# Patient Record
Sex: Female | Born: 1960 | Race: White | Hispanic: No | Marital: Married | Smoking: Former smoker
Health system: Southern US, Community
[De-identification: ages and names within clinical notes are randomized; demographics above are authoritative.]

## PROBLEM LIST (undated history)

## (undated) DIAGNOSIS — F17200 Nicotine dependence, unspecified, uncomplicated: Secondary | ICD-10-CM

## (undated) DIAGNOSIS — G5600 Carpal tunnel syndrome, unspecified upper limb: Secondary | ICD-10-CM

## (undated) DIAGNOSIS — A630 Anogenital (venereal) warts: Secondary | ICD-10-CM

## (undated) DIAGNOSIS — I219 Acute myocardial infarction, unspecified: Secondary | ICD-10-CM

## (undated) DIAGNOSIS — E113299 Type 2 diabetes mellitus with mild nonproliferative diabetic retinopathy without macular edema, unspecified eye: Secondary | ICD-10-CM

## (undated) DIAGNOSIS — I252 Old myocardial infarction: Secondary | ICD-10-CM

## (undated) DIAGNOSIS — Q2381 Congenital stenosis of aortic valve: Secondary | ICD-10-CM

## (undated) DIAGNOSIS — E782 Mixed hyperlipidemia: Secondary | ICD-10-CM

## (undated) DIAGNOSIS — E119 Type 2 diabetes mellitus without complications: Secondary | ICD-10-CM

## (undated) DIAGNOSIS — E7800 Pure hypercholesterolemia, unspecified: Secondary | ICD-10-CM

## (undated) DIAGNOSIS — Z860101 Personal history of adenomatous and serrated colon polyps: Secondary | ICD-10-CM

## (undated) DIAGNOSIS — J302 Other seasonal allergic rhinitis: Secondary | ICD-10-CM

## (undated) DIAGNOSIS — I10 Essential (primary) hypertension: Secondary | ICD-10-CM

## (undated) DIAGNOSIS — K219 Gastro-esophageal reflux disease without esophagitis: Secondary | ICD-10-CM

## (undated) DIAGNOSIS — N950 Postmenopausal bleeding: Secondary | ICD-10-CM

## (undated) DIAGNOSIS — K649 Unspecified hemorrhoids: Secondary | ICD-10-CM

## (undated) DIAGNOSIS — D689 Coagulation defect, unspecified: Secondary | ICD-10-CM

## (undated) DIAGNOSIS — E785 Hyperlipidemia, unspecified: Secondary | ICD-10-CM

## (undated) DIAGNOSIS — R9389 Abnormal findings on diagnostic imaging of other specified body structures: Secondary | ICD-10-CM

## (undated) DIAGNOSIS — I5030 Unspecified diastolic (congestive) heart failure: Secondary | ICD-10-CM

## (undated) DIAGNOSIS — I350 Nonrheumatic aortic (valve) stenosis: Secondary | ICD-10-CM

## (undated) DIAGNOSIS — Z7901 Long term (current) use of anticoagulants: Secondary | ICD-10-CM

## (undated) DIAGNOSIS — R011 Cardiac murmur, unspecified: Secondary | ICD-10-CM

## (undated) DIAGNOSIS — Z973 Presence of spectacles and contact lenses: Secondary | ICD-10-CM

## (undated) DIAGNOSIS — K5730 Diverticulosis of large intestine without perforation or abscess without bleeding: Secondary | ICD-10-CM

## (undated) DIAGNOSIS — D509 Iron deficiency anemia, unspecified: Secondary | ICD-10-CM

## (undated) HISTORY — PX: DILATION AND CURETTAGE OF UTERUS: SHX78

## (undated) HISTORY — DX: Essential (primary) hypertension: I10

## (undated) HISTORY — DX: Anogenital (venereal) warts: A63.0

## (undated) HISTORY — PX: WISDOM TOOTH EXTRACTION: SHX21

## (undated) HISTORY — DX: Hyperlipidemia, unspecified: E78.5

## (undated) HISTORY — DX: Nicotine dependence, unspecified, uncomplicated: F17.200

## (undated) HISTORY — DX: Coagulation defect, unspecified: D68.9

## (undated) HISTORY — DX: Pure hypercholesterolemia, unspecified: E78.00

## (undated) HISTORY — DX: Type 2 diabetes mellitus without complications: E11.9

## (undated) HISTORY — DX: Carpal tunnel syndrome, unspecified upper limb: G56.00

## (undated) HISTORY — PX: COLONOSCOPY: SHX174

---
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
## 1995-01-05 DIAGNOSIS — E119 Type 2 diabetes mellitus without complications: Secondary | ICD-10-CM

## 1995-01-05 HISTORY — DX: Type 2 diabetes mellitus without complications: E11.9

## 1998-11-07 ENCOUNTER — Other Ambulatory Visit: Admission: RE | Admit: 1998-11-07 | Discharge: 1998-11-07 | Payer: Self-pay | Admitting: Gynecology

## 1999-12-02 ENCOUNTER — Other Ambulatory Visit: Admission: RE | Admit: 1999-12-02 | Discharge: 1999-12-02 | Payer: Self-pay | Admitting: Gynecology

## 2001-02-23 ENCOUNTER — Other Ambulatory Visit: Admission: RE | Admit: 2001-02-23 | Discharge: 2001-02-23 | Payer: Self-pay | Admitting: Obstetrics and Gynecology

## 2002-03-09 ENCOUNTER — Other Ambulatory Visit: Admission: RE | Admit: 2002-03-09 | Discharge: 2002-03-09 | Payer: Self-pay | Admitting: Obstetrics and Gynecology

## 2003-03-27 ENCOUNTER — Other Ambulatory Visit: Admission: RE | Admit: 2003-03-27 | Discharge: 2003-03-27 | Payer: Self-pay | Admitting: Obstetrics and Gynecology

## 2003-10-08 ENCOUNTER — Encounter: Admission: RE | Admit: 2003-10-08 | Discharge: 2004-01-06 | Payer: Self-pay | Admitting: Endocrinology

## 2004-02-04 ENCOUNTER — Ambulatory Visit: Payer: Self-pay | Admitting: Endocrinology

## 2004-03-11 ENCOUNTER — Ambulatory Visit: Payer: Self-pay | Admitting: Endocrinology

## 2004-03-17 ENCOUNTER — Ambulatory Visit: Payer: Self-pay | Admitting: Endocrinology

## 2004-04-07 ENCOUNTER — Ambulatory Visit: Payer: Self-pay | Admitting: Gastroenterology

## 2004-04-30 ENCOUNTER — Other Ambulatory Visit: Admission: RE | Admit: 2004-04-30 | Discharge: 2004-04-30 | Payer: Self-pay | Admitting: Obstetrics and Gynecology

## 2004-05-20 ENCOUNTER — Ambulatory Visit: Payer: Self-pay | Admitting: Gastroenterology

## 2004-07-24 ENCOUNTER — Ambulatory Visit: Payer: Self-pay | Admitting: Endocrinology

## 2004-07-29 ENCOUNTER — Ambulatory Visit: Payer: Self-pay | Admitting: Gastroenterology

## 2004-07-31 ENCOUNTER — Ambulatory Visit: Payer: Self-pay | Admitting: Endocrinology

## 2005-02-20 ENCOUNTER — Ambulatory Visit (HOSPITAL_COMMUNITY): Admission: RE | Admit: 2005-02-20 | Discharge: 2005-02-20 | Payer: Self-pay | Admitting: Emergency Medicine

## 2005-04-06 ENCOUNTER — Ambulatory Visit: Payer: Self-pay | Admitting: Endocrinology

## 2005-11-05 ENCOUNTER — Ambulatory Visit: Payer: Self-pay | Admitting: Endocrinology

## 2005-11-05 LAB — CONVERTED CEMR LAB
ALT: 18 units/L (ref 0–40)
Albumin: 3.8 g/dL (ref 3.5–5.2)
Alkaline Phosphatase: 54 units/L (ref 39–117)
BUN: 14 mg/dL (ref 6–23)
Basophils Absolute: 0.1 10*3/uL (ref 0.0–0.1)
Basophils Relative: 0.7 % (ref 0.0–1.0)
Bilirubin Urine: NEGATIVE
Calcium: 9.5 mg/dL (ref 8.4–10.5)
Chloride: 105 meq/L (ref 96–112)
Cholesterol: 146 mg/dL (ref 0–200)
Eosinophil percent: 1.5 % (ref 0.0–5.0)
GFR calc non Af Amer: 97 mL/min
Hemoglobin: 14.5 g/dL (ref 12.0–15.0)
Ketones, ur: NEGATIVE mg/dL
LDL Cholesterol: 70 mg/dL (ref 0–99)
MCHC: 34.5 g/dL (ref 30.0–36.0)
Microalb Creat Ratio: 6.9 mg/g (ref 0.0–30.0)
Monocytes Relative: 6.6 % (ref 3.0–11.0)
Neutrophils Relative %: 62.5 % (ref 43.0–77.0)
Nitrite: NEGATIVE
Potassium: 4.3 meq/L (ref 3.5–5.1)
RDW: 11.8 % (ref 11.5–14.6)
Sodium: 136 meq/L (ref 135–145)
Specific Gravity, Urine: 1.02 (ref 1.000–1.03)
Total Bilirubin: 0.7 mg/dL (ref 0.3–1.2)
Triglyceride fasting, serum: 141 mg/dL (ref 0–149)

## 2005-11-11 ENCOUNTER — Ambulatory Visit: Payer: Self-pay | Admitting: Endocrinology

## 2005-12-03 ENCOUNTER — Ambulatory Visit: Payer: Self-pay | Admitting: Endocrinology

## 2006-07-25 ENCOUNTER — Emergency Department (HOSPITAL_COMMUNITY): Admission: EM | Admit: 2006-07-25 | Discharge: 2006-07-26 | Payer: Self-pay | Admitting: Emergency Medicine

## 2006-08-04 ENCOUNTER — Ambulatory Visit: Payer: Self-pay | Admitting: Internal Medicine

## 2006-08-10 ENCOUNTER — Ambulatory Visit: Payer: Self-pay | Admitting: Endocrinology

## 2006-08-12 ENCOUNTER — Ambulatory Visit: Payer: Self-pay | Admitting: Endocrinology

## 2006-08-20 ENCOUNTER — Encounter: Payer: Self-pay | Admitting: Endocrinology

## 2006-08-20 DIAGNOSIS — I10 Essential (primary) hypertension: Secondary | ICD-10-CM

## 2007-03-30 ENCOUNTER — Encounter: Payer: Self-pay | Admitting: Endocrinology

## 2007-04-04 ENCOUNTER — Ambulatory Visit: Payer: Self-pay | Admitting: Endocrinology

## 2007-04-04 DIAGNOSIS — J309 Allergic rhinitis, unspecified: Secondary | ICD-10-CM | POA: Insufficient documentation

## 2007-04-04 LAB — CONVERTED CEMR LAB: Hgb A1c MFr Bld: 8.4 % — ABNORMAL HIGH (ref 4.6–6.0)

## 2007-05-11 ENCOUNTER — Ambulatory Visit: Payer: Self-pay | Admitting: Endocrinology

## 2007-05-18 ENCOUNTER — Telehealth: Payer: Self-pay | Admitting: Endocrinology

## 2007-05-18 ENCOUNTER — Encounter: Payer: Self-pay | Admitting: Endocrinology

## 2007-06-22 ENCOUNTER — Ambulatory Visit: Payer: Self-pay | Admitting: Endocrinology

## 2007-08-28 ENCOUNTER — Telehealth: Payer: Self-pay | Admitting: Endocrinology

## 2007-11-06 LAB — CONVERTED CEMR LAB: Pap Smear: NORMAL

## 2008-03-08 ENCOUNTER — Encounter: Payer: Self-pay | Admitting: Endocrinology

## 2008-05-13 ENCOUNTER — Telehealth: Payer: Self-pay | Admitting: Endocrinology

## 2008-06-25 ENCOUNTER — Ambulatory Visit: Payer: Self-pay | Admitting: Endocrinology

## 2008-06-25 DIAGNOSIS — E7800 Pure hypercholesterolemia, unspecified: Secondary | ICD-10-CM

## 2008-06-25 LAB — CONVERTED CEMR LAB
ALT: 18 units/L (ref 0–35)
Alkaline Phosphatase: 57 units/L (ref 39–117)
BUN: 12 mg/dL (ref 6–23)
Bilirubin, Direct: 0.1 mg/dL (ref 0.0–0.3)
CO2: 26 meq/L (ref 19–32)
Creatinine, Ser: 0.5 mg/dL (ref 0.4–1.2)
Direct LDL: 247.7 mg/dL
GFR calc non Af Amer: 140.31 mL/min (ref >60)
Glucose, Bld: 179 mg/dL — ABNORMAL HIGH (ref 70–99)
Sodium: 141 meq/L (ref 135–145)
VLDL: 34.8 mg/dL (ref 0.0–40.0)

## 2008-07-09 ENCOUNTER — Ambulatory Visit: Payer: Self-pay | Admitting: Endocrinology

## 2008-07-11 ENCOUNTER — Telehealth (INDEPENDENT_AMBULATORY_CARE_PROVIDER_SITE_OTHER): Payer: Self-pay | Admitting: *Deleted

## 2009-06-24 ENCOUNTER — Encounter: Payer: Self-pay | Admitting: Endocrinology

## 2009-11-18 ENCOUNTER — Ambulatory Visit: Payer: Self-pay | Admitting: Endocrinology

## 2009-11-18 LAB — CONVERTED CEMR LAB
ALT: 18 units/L (ref 0–35)
AST: 16 units/L (ref 0–37)
BUN: 15 mg/dL (ref 6–23)
Basophils Absolute: 0 10*3/uL (ref 0.0–0.1)
Bilirubin, Direct: 0.1 mg/dL (ref 0.0–0.3)
CO2: 26 meq/L (ref 19–32)
Cholesterol: 375 mg/dL — ABNORMAL HIGH (ref 0–200)
Creatinine, Ser: 0.9 mg/dL (ref 0.4–1.2)
Eosinophils Absolute: 0.1 10*3/uL (ref 0.0–0.7)
HCT: 39.3 % (ref 36.0–46.0)
Hemoglobin, Urine: NEGATIVE
Hemoglobin: 14.1 g/dL (ref 12.0–15.0)
Lymphocytes Relative: 22.9 % (ref 12.0–46.0)
Lymphs Abs: 2.1 10*3/uL (ref 0.7–4.0)
MCHC: 35.8 g/dL (ref 30.0–36.0)
MCV: 90.3 fL (ref 78.0–100.0)
Microalb Creat Ratio: 2.1 mg/g (ref 0.0–30.0)
Monocytes Relative: 7.7 % (ref 3.0–12.0)
Neutro Abs: 6 10*3/uL (ref 1.4–7.7)
Neutrophils Relative %: 67.4 % (ref 43.0–77.0)
RBC: 4.35 M/uL (ref 3.87–5.11)
Specific Gravity, Urine: 1.01 (ref 1.000–1.030)
Total Bilirubin: 0.9 mg/dL (ref 0.3–1.2)
Urobilinogen, UA: 0.2 (ref 0.0–1.0)
WBC: 9 10*3/uL (ref 4.5–10.5)
pH: 5.5 (ref 5.0–8.0)

## 2009-12-18 ENCOUNTER — Encounter: Payer: Self-pay | Admitting: Endocrinology

## 2009-12-18 ENCOUNTER — Ambulatory Visit: Payer: Self-pay | Admitting: Endocrinology

## 2009-12-18 DIAGNOSIS — R079 Chest pain, unspecified: Secondary | ICD-10-CM

## 2009-12-18 DIAGNOSIS — Z87891 Personal history of nicotine dependence: Secondary | ICD-10-CM

## 2010-01-08 ENCOUNTER — Ambulatory Visit
Admission: RE | Admit: 2010-01-08 | Discharge: 2010-01-08 | Payer: Self-pay | Source: Home / Self Care | Attending: Cardiology | Admitting: Cardiology

## 2010-01-15 ENCOUNTER — Ambulatory Visit
Admission: RE | Admit: 2010-01-15 | Discharge: 2010-01-15 | Payer: Self-pay | Source: Home / Self Care | Attending: Endocrinology | Admitting: Endocrinology

## 2010-01-25 ENCOUNTER — Encounter: Payer: Self-pay | Admitting: Emergency Medicine

## 2010-01-29 ENCOUNTER — Ambulatory Visit: Admit: 2010-01-29 | Payer: Self-pay | Admitting: Cardiology

## 2010-01-29 ENCOUNTER — Ambulatory Visit: Admit: 2010-01-29 | Payer: Self-pay

## 2010-02-03 NOTE — Letter (Signed)
Summary: Dr Davina Poke [facility]  Dr Davina Poke [facility]   Imported By: Lester [place] 07/10/2009 10:21:05  _____________________________________________________________________  External Attachment:    Type:   Image     Comment:   External Document

## 2010-02-03 NOTE — Assessment & Plan Note (Signed)
Summary: f/u appt/#/cd   Vital Signs:  Patient profile:   50 year old female Height:      66 inches (167.64 cm) Weight:      200.50 pounds (91.14 kg) BMI:     32.48 O2 Sat:      95 % on Room air Temp:     98.6 degrees F (37.00 degrees C) oral Pulse rate:   102 / minute BP sitting:   122 / 70  (left arm) Cuff size:   regular  Vitals Entered By: Brenton Grills CMA Duncan Dull) (November 18, 2009 1:19 PM)  O2 Flow:  Room air CC: Follow-up visit/discuss meds/refill meds/pt is not currently taking Zocor/aj Is Patient Diabetic? Yes   CC:  Follow-up visit/discuss meds/refill meds/pt is not currently taking Zocor/aj.  History of Present Illness: no cbg record, but states cbg's are well-controlled, on 80 units three times a day (just before each meal).  however, she has been out of insulin x a few weeks.   pt states a few weeks moderate itching at the vaginal area, and assoc d/c. she wants to go back to lipitor.  Current Medications (verified): 1)  Lisinopril-Hydrochlorothiazide 20-12.5 Mg  Tabs (Lisinopril-Hydrochlorothiazide) .... Take 2 By Mouth Qd 2)  Glucose Test Strips, Any Brand .... Qid 3)  Humalog Kwikpen 100 Unit/ml  Soln (Insulin Lispro (Human)) .... 70 Units With Each Meal. 4)  Bd Pen Needle Ultrafine 29g X 12.79mm Misc (Insulin Pen Needle) .... Tid 5)  Zocor 80 Mg Tabs (Simvastatin) .Marland Kitchen.. 1 Qhs  Allergies (verified): No Known Drug Allergies  Past History:  Past Medical History: Last updated: 08/20/2006 Diabetes mellitus, type I Hypertension Dyslipidemia Gential Warts Smoker Congential Adrenal Hyperpisia CTS  Review of Systems  The patient denies weight loss and weight gain.    Physical Exam  General:  obese.  no distress  Pulses:  dorsalis pedis intact bilat.  Extremities:  no deformity.  no ulcer on the feet.  feet are of normal color and temp.  no edema  Neurologic:  sensation is intact to touch on the feet    Impression & Recommendations:  Problem #  1:  DIABETES MELLITUS, TYPE I (ICD-250.01) therapy limited by noncompliance.  i'll do the best i can.  she may do better with a once daily insulin  Problem # 2:  vaginitis prob yeast due to #1  Problem # 3:  HYPERCHOLESTEROLEMIA (ICD-272.0) needs increased rx  Medications Added to Medication List This Visit: 1)  Onetouch Ultra Blue Strp (Glucose blood) .... Use as directed three times a day 2)  Humalog Kwikpen 100 Unit/ml Soln (Insulin lispro (human)) .... 80 units with each meal. 3)  Lipitor 80 Mg Tabs (Atorvastatin calcium) .Marland Kitchen.. 1 tab at bedtime 4)  Fluconazole 100 Mg Tabs (Fluconazole) .Marland Kitchen.. 1 tab once daily  Other Orders: TLB-Lipid Panel (80061-LIPID) TLB-BMP (Basic Metabolic Panel-BMET) (80048-METABOL) TLB-CBC Platelet - w/Differential (85025-CBCD) TLB-Hepatic/Liver Function Pnl (80076-HEPATIC) TLB-TSH (Thyroid Stimulating Hormone) (84443-TSH) TLB-A1C / Hgb A1C (Glycohemoglobin) (83036-A1C) TLB-Microalbumin/Creat Ratio, Urine (82043-MALB) TLB-Udip w/ Micro (81001-URINE) Est. Patient Level IV ([address]109)  Patient Instructions: 1)  take fluconazole 100 mg once daily, x 3 days. 2)  take lipitor 80 mg at bedtime.  do not take this while on the fluconazole.   3)  Please schedule a physical appointment in 3 weeks. 4)  check your blood sugar 2 times a day.  vary the time of day when you check, between before the 3 meals, and at bedtime.  also check if you  have symptoms of your blood sugar being too high or too low.  please keep a record of the readings and bring it to your next appointment here.  please call us sooner if you are having low blood sugar episodes. 5)  good diet and exercise habits significanly improve the control of your diabetes.  please let me know if you wish to be referred to a dietician.  high blood sugar is very risky to your health.  you should see an eye doctor every year. 6)  controlling your blood pressure and cholesterol drastically reduces the damage diabetes does  to your body.  this also applies to quitting smoking.  please discuss these with your doctor.  you should take an aspirin every day, unless you have been advised by a doctor not to. 7)  (update: i left message on phone-tree:  i offered to change to a once daily insulin). Prescriptions: ONETOUCH ULTRA BLUE  STRP (GLUCOSE BLOOD) use as directed three times a day  #100 x 11   Entered by:   Margaret Pyle, CMA   Authorized by:   Minus Breeding MD   Signed by:   Margaret Pyle, CMA on 11/19/2009   Method used:   Electronically to        [facility] [phone]* (retail)       [address]       [place], [place]  [address]       Ph: [phone]       Fax: [phone]   RxID:   [id number] HUMALOG KWIKPEN 100 UNIT/ML  SOLN (INSULIN LISPRO (HUMAN)) 80 units with each meal.  #5 boxes x 11   Entered and Authorized by:   Minus Breeding MD   Signed by:   Minus Breeding MD on 11/18/2009   Method used:   Electronically to        [facility]. [phone]* (retail)       [address]       [place], [place]  [address]       Ph: [phone]       Fax: [phone]   RxID:   [id number] FLUCONAZOLE 100 MG TABS (FLUCONAZOLE) 1 tab once daily  #3 x 0   Entered and Authorized by:   Minus Breeding MD   Signed by:   Minus Breeding MD on 11/18/2009   Method used:   Electronically to        [facility]. [phone]* (retail)       [address]       [place], [place]  [address]       Ph: [phone]       Fax: [phone]   RxID:   [id number] LIPITOR 80 MG TABS (ATORVASTATIN CALCIUM) 1 tab at bedtime  #30 x 11   Entered and Authorized by:   Minus Breeding MD   Signed by:   Minus Breeding MD on 11/18/2009   Method used:   Electronically to        [facility] [phone]* (retail)       [address]       [place], [place]  [address]       Ph:  [phone]  Fax: [phone]   RxID:   [id number]    Orders Added: 1)  TLB-Lipid Panel [80061-LIPID] 2)  TLB-BMP (Basic Metabolic Panel-BMET) [80048-METABOL] 3)  TLB-CBC Platelet - w/Differential [85025-CBCD] 4)  TLB-Hepatic/Liver Function Pnl [80076-HEPATIC] 5)  TLB-TSH (Thyroid Stimulating Hormone) [84443-TSH] 6)  TLB-A1C / Hgb A1C (Glycohemoglobin) [83036-A1C] 7)  TLB-Microalbumin/Creat Ratio, Urine [82043-MALB] 8)  TLB-Udip w/ Micro [81001-URINE] 9)  Est. Patient Level IV [46962]

## 2010-02-05 NOTE — Assessment & Plan Note (Signed)
Summary: CPX/ NWS  #   Vital Signs:  Patient profile:   50 year old female Height:      66 inches (167.64 cm) Weight:      211 pounds (95.91 kg) BMI:     34.18 O2 Sat:      97 % on Room air Temp:     98.6 degrees F (37.00 degrees C) oral Pulse rate:   99 / minute BP sitting:   122 / 70  (left arm) Cuff size:   regular  Vitals Entered By: Brenton Grills CMA Duncan Dull) (December 18, 2009 2:26 PM)  O2 Flow:  Room air CC: CPX/aj Comments Pt is due for yearly pap and mammogram  Vision Screening:      Vision Comments: Eye Exam done 06/24/2009 Dr. London Sheer  Vision Entered By: Brenton Grills CMA Duncan Dull) (December 18, 2009 2:28 PM)   CC:  CPX/aj.  History of Present Illness: here for regular wellness examination.  she's feeling pretty well in general, and does not smoke.  alcohol is rare.   Current Medications (verified): 1)  Lisinopril-Hydrochlorothiazide 20-12.5 Mg  Tabs (Lisinopril-Hydrochlorothiazide) .... Take 2 By Mouth Qd 2)  Onetouch Ultra Blue  Strp (Glucose Blood) .... Use As Directed Three Times A Day 3)  Humalog Kwikpen 100 Unit/ml  Soln (Insulin Lispro (Human)) .... 80 Units With Each Meal. 4)  Bd Pen Needle Ultrafine 29g X 12.34mm Misc (Insulin Pen Needle) .... Tid 5)  Lipitor 80 Mg Tabs (Atorvastatin Calcium) .Marland Kitchen.. 1 Tab At Bedtime 6)  Fluconazole 100 Mg Tabs (Fluconazole) .Marland Kitchen.. 1 Tab Once Daily  Allergies (verified): No Known Drug Allergies  Family History: Reviewed history from 07/09/2008 and no changes required. mother: breast cancer  Social History: Reviewed history from 07/09/2008 and no changes required. works 3rd shift in [employer] married  Review of Systems       The patient complains of weight gain.  The patient denies fever, vision loss, decreased hearing, chest pain, syncope, prolonged cough, headaches, abdominal pain, melena, hematochezia, and severe indigestion/heartburn.    Physical Exam  General:  normal appearance.   Head:   head: no deformity eyes: no periorbital swelling, no proptosis external nose and ears are normal mouth: no lesion seen Neck:  Supple without thyroid enlargement or tenderness.  Breasts:  sees gyn Lungs:  Clear to auscultation bilaterally. Normal respiratory effort.  Heart:  Regular rate and rhythm without murmurs or gallops noted. Normal S1,S2.   Abdomen:  abdomen is soft, nontender.  no hepatosplenomegaly.   not distended.  no hernia  Rectal:  sees gyn  Genitalia:  sees gyn  Msk:  muscle bulk and strength are grossly normal.  no obvious joint swelling.  gait is normal and steady  Pulses:  dorsalis pedis intact bilat.  no carotid bruit  Extremities:  no deformity.  no ulcer on the feet.  feet are of normal color and temp.  no edema  Neurologic:  cn 2-12 grossly intact.   readily moves all 4's.   sensation is intact to touch on the feet  Skin:  normal texture and temp.  no rash.  not diaphoretic there is mild hirsutism on the face Cervical Nodes:  No significant adenopathy.  Psych:  Alert and cooperative; normal mood and affect; normal attention span and concentration.   Additional Exam:  SEPARATE EVALUATION FOLLOWS--EACH PROBLEM HERE IS NEW, NOT RESPONDING TO TREATMENT, OR POSES SIGNIFICANT RISK TO THE PATIENT'S HEALTH: HISTORY OF THE PRESENT ILLNESS: pt states few weeks  of intermittent non-exertional slight pain in the chest.  she describes as a "twinge."  no assoc sob. she also c/o occ "tickle in the throat." pt says the 3rd shift work is making it hard for her to manage her dm.  cbg's are variable, but are higher after eating.   PAST MEDICAL HISTORY reviewed and up to date today REVIEW OF SYSTEMS: denies sob and hypoglycemia PHYSICAL EXAMINATION: chest wall: nontender. clear to auscultation.  no respiratory distress reg rate and rhythm.  no murmur LAB/XRAY RESULTS: see ecg report. Hemoglobin A1C       [H]  12.9 %  IMPRESSION: chest pain, uncertain etiology cough due  to lisinopril.  pt declines changing to arb dm.  rx limited by 3rd shift work.  she needs a simpler schedule.   PLAN: see instruction sheet.   Impression & Recommendations:  Problem # 1:  ROUTINE GENERAL MEDICAL EXAM@HEALTH  CARE FACL (ICD-V70.0)  Medications Added to Medication List This Visit: 1)  Humalog Kwikpen 100 Unit/ml Soln (Insulin lispro (human)) .Marland Kitchen.. 10 units with each meal. 2)  Lantus Solostar 100 Unit/ml Soln (Insulin glargine) .... 250 units each am 3)  Aspirin 81 Mg Tbec (Aspirin) .Marland Kitchen.. 1 tab once daily  Other Orders: EKG w/ Interpretation (93000) Pneumococcal Vaccine (16109) Admin 1st Vaccine (60454) Flu Vaccine 4yrs + (09811) Admin of Any Addtl Vaccine (91478) Cardiology Referral (Cardiology) Est. Patient Level IV (29562) Est. Patient 40-64 years [phone])  Preventive Care Screening     gyn is m bigleman   Patient Instructions: 1)  check your blood sugar 2 times a day.  vary the time of day when you check, between before the 3 meals, and at bedtime.  also check if you have symptoms of your blood sugar being too high or too low.  please keep a record of the readings and bring it to your next appointment here.  please call us sooner if you are having low blood sugar episodes. 2)  start lantus 250 units each evening. 3)  also take humalog 10 units with each meal. 4)  Please schedule a follow-up appointment in 1 month. 5)  we'll recheck cholesterol with next blood tests.  6)  check "treadmill" test.  you will be called with a day and time for an appointment. 7)  please consider these measures for your health:  minimize alcohol.  do not use tobacco products.  have a colonoscopy at least every 10 years from age 73.  keep firearms safely stored.  always use seat belts.  have working smoke alarms in your home.  see an eye doctor and dentist regularly.  never drive under the influence of alcohol or drugs (including prescription drugs).  those with fair skin should take  precautions against the sun. 8)  please let me know what your wishes would be, if artificial life support measures should become necessary.  it is critically important to prevent falling down (keep floor areas well-lit, dry, and free of loose objects). 9)  (update: we discussed code status.  pt requests full code, but would not want to be started or maintained on artificial life-support measures if there was not a reasonable chance of recovery). Prescriptions: LANTUS SOLOSTAR 100 UNIT/ML SOLN (INSULIN GLARGINE) 250 units each am  #6 boxes x 11   Entered and Authorized by:   Minus Breeding MD   Signed by:   Minus Breeding MD on 12/18/2009   Method used:   Electronically to  [facility] [address] [phone]* (retail)       [address]       [place], [place]  [address]       Ph: [phone]       Fax: [phone]   RxID:   [id number]    Orders Added: 1)  EKG w/ Interpretation [93000] 2)  Pneumococcal Vaccine [90732] 3)  Admin 1st Vaccine [90471] 4)  Flu Vaccine 58yrs + [[address]] 5)  Admin of Any Addtl Vaccine [90472] 6)  Cardiology Referral [Cardiology] 7)  Est. Patient Level IV [66440] 8)  Est. Patient 40-64 years [99396]   Immunizations Administered:  Pneumonia Vaccine:    Vaccine Type: Pneumovax    Site: left deltoid    Mfr: Merck    Dose: 0.5 ml    Route: IM    Given by: Brenton Grills CMA (AAMA)    Exp. Date: 05/01/2011    Lot #: [id number]    VIS given: 12/09/08 version given December 18, 2009.  Influenza Vaccine # 1:    Vaccine Type: Fluvax 3+    Site: right deltoid    Mfr: Sanofi Pasteur    Dose: 0.5 ml    Given by: Brenton Grills CMA (AAMA)    Exp. Date: 07/04/2010    Lot #: [id number]    VIS given: 2011   Immunizations Administered:  Pneumonia Vaccine:    Vaccine Type: Pneumovax    Site: left deltoid    Mfr: Merck    Dose: 0.5 ml    Route: IM    Given by: Brenton Grills CMA (AAMA)    Exp. Date: 05/01/2011    Lot #:  [id number]    VIS given: 12/09/08 version given December 18, 2009.  Influenza Vaccine # 1:    Vaccine Type: Fluvax 3+    Site: right deltoid    Mfr: Sanofi Pasteur    Dose: 0.5 ml    Given by: Brenton Grills CMA (AAMA)    Exp. Date: 07/04/2010    Lot #: [id number]    VIS given: 2011

## 2010-02-05 NOTE — Assessment & Plan Note (Signed)
Summary: 1 MOS F/U #/CD   Vital Signs:  Patient profile:   50 year old female Height:      66 inches (167.64 cm) Weight:      220.50 pounds (100.23 kg) BMI:     35.72 O2 Sat:      96 % on Room air Temp:     98.2 degrees F (36.78 degrees C) oral Pulse rate:   89 / minute BP sitting:   130 / 68  (left arm) Cuff size:   large  Vitals Entered By: Brenton Grills CMA (AAMA) (January 15, 2010 1:30 PM)  O2 Flow:  Room air CC: 1 month F/U/aj Is Patient Diabetic? Yes Comments Pt is due for mammogram and pap   Primary Provider:  Minus Breeding MD  CC:  1 month F/U/aj.  History of Present Illness: no cbg record, but states cbg's are sometimes low if she hasn't eaten in many hours, but is higher after eating.  pt states she feels well in general.    Current Medications (verified): 1)  Lisinopril-Hydrochlorothiazide 20-12.5 Mg  Tabs (Lisinopril-Hydrochlorothiazide) .... Take 2 By Mouth Qd 2)  Onetouch Ultra Blue  Strp (Glucose Blood) .... Use As Directed Three Times A Day 3)  Humalog Kwikpen 100 Unit/ml  Soln (Insulin Lispro (Human)) .Marland Kitchen.. 10 Units With Each Meal. 4)  Bd Pen Needle Ultrafine 29g X 12.39mm Misc (Insulin Pen Needle) .... Tid 5)  Lipitor 80 Mg Tabs (Atorvastatin Calcium) .Marland Kitchen.. 1 Tab At Bedtime 6)  Lantus Solostar 100 Unit/ml Soln (Insulin Glargine) .... 250 Units Each Am 7)  Aspirin 81 Mg Tbec (Aspirin) .Marland Kitchen.. 1 Tab Once Daily  Allergies (verified): No Known Drug Allergies  Past History:  Past Medical History: Last updated: 08/20/2006 Diabetes mellitus, type I Hypertension Dyslipidemia Gential Warts Smoker Congential Adrenal Hyperpisia CTS  Review of Systems  The patient denies syncope.    Physical Exam  General:  normal appearance.   Skin:  injection sites at the anterior abdomen are without lesions, except for 1 ecchymosis..     Impression & Recommendations:  Problem # 1:  DIABETES MELLITUS, TYPE I (ICD-250.01) the pattern of her cbg's indicates she  needs more humalog, and less lantus  Medications Added to Medication List This Visit: 1)  Bd Pen Needle Ultrafine 29g X 12.82mm Misc (Insulin pen needle) .... 5/day  Other Orders: Est. Patient Level III (16109)  Patient Instructions: 1)  check your blood sugar 2 times a day.  vary the time of day when you check, between before the 3 meals, and at bedtime.  also check if you have symptoms of your blood sugar being too high or too low.  please keep a record of the readings and bring it to your next appointment here.  please call us sooner if you are having low blood sugar episodes. 2)  start lantus 220 units each evening. 3)  also take humalog 20 units with each meal. 4)  Please schedule a follow-up appointment in 2 months. Prescriptions: HUMALOG KWIKPEN 100 UNIT/ML  SOLN (INSULIN LISPRO (HUMAN)) 10 units with each meal.  #1 box x 11   Entered and Authorized by:   Minus Breeding MD   Signed by:   Minus Breeding MD on 01/15/2010   Method used:   Print then Give to Patient   RxID:   [id number] BD PEN NEEDLE ULTRAFINE 29G X 12.7MM MISC (INSULIN PEN NEEDLE) 5/day  #450 x 3   Entered and Authorized by:   Gregary Signs  Ardeen Garland MD   Signed by:   Minus Breeding MD on 01/15/2010   Method used:   Electronically to        [facility]* (retail)       [address]       [place], [place]  [address]       Ph: [phone] or [phone]       Fax: [phone]   RxID:   [id number]    Orders Added: 1)  Est. Patient Level III [57322]

## 2010-02-05 NOTE — Assessment & Plan Note (Signed)
Summary: NP6/CHEST PAIN      Allergies Added: NKDA  Visit Type:  Initial Consult Primary Provider:  Minus Breeding MD  CC:  chest pain.  History of Present Illness: The patient presents for evaluation of chest discomfort. She has no prior cardiac history. However, she has multiple cardiovascular risk factors. She has been in the past couple of months noticing occasional chest discomfort. This is at rest.  It is a left upper chest discomfort. He has been a stable pattern. He describes it as somewhat dull but maybe 4/10. She doesn't pay much attention to it. She will notice that his airand it goes away spontaneously. She cannot bring this on with activities such as vacuuming or climbing stairs. She has no associated symptoms of nausea vomiting or diaphoresis. She has no radiation to her jaw or to her arms. She has no weight gain or edema. She has no shortness of breath, PND or orthopnea..  Current Medications (verified): 1)  Lisinopril-Hydrochlorothiazide 20-12.5 Mg  Tabs (Lisinopril-Hydrochlorothiazide) .... Take 2 By Mouth Qd 2)  Onetouch Ultra Blue  Strp (Glucose Blood) .... Use As Directed Three Times A Day 3)  Humalog Kwikpen 100 Unit/ml  Soln (Insulin Lispro (Human)) .Marland Kitchen.. 10 Units With Each Meal. 4)  Bd Pen Needle Ultrafine 29g X 12.62mm Misc (Insulin Pen Needle) .... Tid 5)  Lipitor 80 Mg Tabs (Atorvastatin Calcium) .Marland Kitchen.. 1 Tab At Bedtime 6)  Lantus Solostar 100 Unit/ml Soln (Insulin Glargine) .... 250 Units Each Am 7)  Aspirin 81 Mg Tbec (Aspirin) .Marland Kitchen.. 1 Tab Once Daily  Allergies (verified): No Known Drug Allergies  Past History:  Past Medical History: Reviewed history from 08/20/2006 and no changes required. Diabetes mellitus, type I Hypertension Dyslipidemia Gential Warts Smoker Congential Adrenal Hyperpisia CTS  Past Surgical History: Reviewed history from 08/20/2006 and no changes required. D & C (1977) EKG (11/11/2005)  Family History: Mother: breast cancer, later  onset heart disease Father with coronary disease starting in his 66s. A bypass and mitral infarction  Social History: Works 3rd shift in [employer], married.  She smoked one pack per day for 26 years but quit in 2009.  Review of Systems       As stated in the HPI and negative for all other systems.   Vital Signs:  Patient profile:   50 year old female Height:      66 inches Weight:      214 pounds BMI:     34.67 Pulse rate:   55 / minute Resp:     16 per minute BP sitting:   178 / 96  (left arm)  Vitals Entered By: Marrion Coy, CNA (January 08, 2010 3:34 PM)  Physical Exam  General:  Well developed, well nourished, in no acute distress. Head:  normocephalic and atraumatic Eyes:  PERRLA/EOM intact; conjunctiva and lids normal. Mouth:  Teeth, gums and palate normal. Oral mucosa normal. Neck:  Neck supple, no JVD. No masses, thyromegaly or abnormal cervical nodes. Chest Wall:  no deformities or breast masses noted Lungs:  Clear bilaterally to auscultation and percussion. Abdomen:  Bowel sounds positive; abdomen soft and non-tender without masses, organomegaly, or hernias noted. No hepatosplenomegaly. Msk:  Back normal, normal gait. Muscle strength and tone normal. Skin:  Intact without lesions or rashes. Cervical Nodes:  no significant adenopathy Axillary Nodes:  no significant adenopathy Inguinal Nodes:  no significant adenopathy Psych:  Normal affect.   Detailed Cardiovascular Exam  Neck    Carotids: Carotids  full and equal bilaterally without bruits.      Neck Veins: Normal, no JVD.    Heart    Inspection: no deformities or lifts noted.      Palpation: normal PMI with no thrills palpable.      Auscultation: regular rate and rhythm, S1, S2 without murmurs, rubs, gallops, or clicks.    Vascular    Abdominal Aorta: no palpable masses, pulsations, or audible bruits.      Femoral Pulses: normal femoral pulses bilaterally.      Pedal Pulses: normal pedal  pulses bilaterally.      Radial Pulses: normal radial pulses bilaterally.      Peripheral Circulation: no clubbing, cyanosis, or edema noted with normal capillary refill.     Impression & Recommendations:  Problem # 1:  CHEST PAIN (ICD-786.50) The patient's chest pain is atypical. She has no objective evidence of ischemia. However, she has very significant risk factors. Screening exercise treadmill test is indicated. In addition to looking for obstructive coronary disease and risk stratify I think the most important thing that will come of this is a prescription for exercise. Orders: Treadmill (Treadmill)  Problem # 2:  HYPERCHOLESTEROLEMIA (ICD-272.0) I reviewed her lipids. Her total cholesterol was 375 in December. However, this was prior to being switched to Lipitor. This is being followed by Dr. Everardo All. Orders: Treadmill (Treadmill)  Problem # 3:  HYPERTENSION (ICD-401.9) Her blood pressure is controlled. She will continue the meds as listed.  Problem # 4:  DIABETES MELLITUS, TYPE I (ICD-250.01) Her hemoglobin A1c most recently was 12.9. She understands that this is not acceptable and she is now paying more attention to this.  Patient Instructions: 1)  Your physician recommends that you schedule a follow-up appointment at the time of your treadmill 2)  Your physician recommends that you continue on your current medications as directed. Please refer to the Current Medication list given to you today. 3)  Your physician has requested that you have an exercise tolerance test.  For further information please visit [email or web address]  Please also follow instruction sheet, as given.

## 2010-03-11 ENCOUNTER — Telehealth: Payer: Self-pay | Admitting: Endocrinology

## 2010-03-13 ENCOUNTER — Encounter: Payer: Self-pay | Admitting: Cardiology

## 2010-03-17 ENCOUNTER — Encounter: Payer: Self-pay | Admitting: Endocrinology

## 2010-03-17 ENCOUNTER — Other Ambulatory Visit: Payer: BC Managed Care – PPO

## 2010-03-17 ENCOUNTER — Other Ambulatory Visit: Payer: Self-pay | Admitting: Endocrinology

## 2010-03-17 ENCOUNTER — Ambulatory Visit (INDEPENDENT_AMBULATORY_CARE_PROVIDER_SITE_OTHER): Payer: BC Managed Care – PPO | Admitting: Endocrinology

## 2010-03-17 DIAGNOSIS — E109 Type 1 diabetes mellitus without complications: Secondary | ICD-10-CM

## 2010-03-17 NOTE — Progress Notes (Signed)
Summary: 90 day supply  Phone Note Refill Request Message from:  Patient on March 11, 2010 10:55 AM  Refills Requested: Medication #1:  LIPITOR 80 MG TABS 1 tab at bedtime   Dosage confirmed as above?Dosage Confirmed   Supply Requested: 9 months Pt called requesting 90 day supply with refills per [employer]   Method Requested: Electronic Initial call taken by: Margaret Pyle, CMA,  March 11, 2010 10:55 AM    Prescriptions: LIPITOR 80 MG TABS (ATORVASTATIN CALCIUM) 1 tab at bedtime  #90 x 2   Entered by:   Margaret Pyle, CMA   Authorized by:   Minus Breeding MD   Signed by:   Margaret Pyle, CMA on 03/11/2010   Method used:   Electronically to        [id number] (retail)       [address]       [place], [place]  [address]       Ph: [phone] or [phone]       Fax: [phone]   RxID:   [id number]

## 2010-03-24 NOTE — Assessment & Plan Note (Signed)
Summary: 2 month follow up/ ss   Vital Signs:  Patient profile:   50 year old female Height:      66 inches (167.64 cm) Weight:      226 pounds (102.73 kg) BMI:     36.61 O2 Sat:      95 % on Room air Temp:     98.8 degrees F (37.11 degrees C) oral Pulse rate:   98 / minute BP sitting:   118 / 72  (left arm) Cuff size:   large  Vitals Entered By: Brenton Grills CMA (AAMA) (March 17, 2010 1:08 PM)  O2 Flow:  Room air CC: 2 month F/U/aj Is Patient Diabetic? Yes   Primary Provider:  Minus Breeding MD  CC:  2 month F/U/aj.  History of Present Illness: pt states she feels well in general.  no cbg record, but states cbg's are sometimes as low as the 70's, in the early hrs of the am, or if a meal is delayed.    Current Medications (verified): 1)  Lisinopril-Hydrochlorothiazide 20-12.5 Mg  Tabs (Lisinopril-Hydrochlorothiazide) .... Take 2 By Mouth Qd 2)  Onetouch Ultra Blue  Strp (Glucose Blood) .... Use As Directed Three Times A Day 3)  Humalog Kwikpen 100 Unit/ml  Soln (Insulin Lispro (Human)) .Marland Kitchen.. 10 Units With Each Meal. 4)  Bd Pen Needle Ultrafine 29g X 12.76mm Misc (Insulin Pen Needle) .... 5/day 5)  Lipitor 80 Mg Tabs (Atorvastatin Calcium) .Marland Kitchen.. 1 Tab At Bedtime 6)  Lantus Solostar 100 Unit/ml Soln (Insulin Glargine) .... 250 Units Each Am 7)  Aspirin 81 Mg Tbec (Aspirin) .Marland Kitchen.. 1 Tab Once Daily  Allergies (verified): No Known Drug Allergies  Past History:  Past Medical History: Last updated: 08/20/2006 Diabetes mellitus, type I Hypertension Dyslipidemia Gential Warts Smoker Congential Adrenal Hyperpisia CTS  Social History: Reviewed history from 01/08/2010 and no changes required. Works 3rd shift in [employer], married.  She smoked one pack per day for 26 years but quit in 2009.  Review of Systems  The patient denies syncope.    Physical Exam  General:  normal appearance.   Pulses:  dorsalis pedis intact bilat.   Extremities:  no deformity.   no ulcer on the feet.  feet are of normal color and temp.  no edema.  Neurologic:  sensation is intact to touch on the feet  Additional Exam:  Hemoglobin A1C       [H]  8.2 %    Impression & Recommendations:  Problem # 1:  DIABETES MELLITUS, TYPE I (ICD-250.01) she needs some adjustment in her therapy  Medications Added to Medication List This Visit: 1)  Humalog Kwikpen 100 Unit/ml Soln (Insulin lispro (human)) .... 30 units three times a day (just before each meal) 2)  Lantus Solostar 100 Unit/ml Soln (Insulin glargine) .Marland KitchenMarland KitchenMarland Kitchen 190 units once daily  Other Orders: TLB-A1C / Hgb A1C (Glycohemoglobin) (83036-A1C) Est. Patient Level III ([address])  Patient Instructions: 1)  check your blood sugar 2 times a day.  vary the time of day when you check, between before the 3 meals, and at bedtime.  also check if you have symptoms of your blood sugar being too high or too low.  please keep a record of the readings and bring it to your next appointment here.  please call us sooner if you are having low blood sugar episodes. 2)  blood tests are being ordered for you today.  please call [phone] to hear your test results. 3)  pending the  test results, please: 4)  reduce lantus to 190 units each evening, and: 5)  increase humalog to 30 units with each meal. 6)  Please schedule a follow-up appointment in 3 months. 7)  (update: i left message on phone-tree:  rx as we discussed) Prescriptions: HUMALOG KWIKPEN 100 UNIT/ML  SOLN (INSULIN LISPRO (HUMAN)) 30 units three times a day (just before each meal)  #7 boxes x 3   Entered and Authorized by:   Minus Breeding MD   Signed by:   Minus Breeding MD on 03/17/2010   Method used:   Electronically to        [id number] ([facility]       [address]       [place], [place]  [address]       Ph: [phone] or [phone]       Fax: [phone]   RxID:   [id number] HUMALOG KWIKPEN 100 UNIT/ML  SOLN (INSULIN LISPRO (HUMAN)) 30 units three times a day  (just before each meal)  #2 boxes x 11   Entered and Authorized by:   Minus Breeding MD   Signed by:   Minus Breeding MD on 03/17/2010   Method used:   Electronically to        [facility] [phone]* (retail)       [address]       [place], [place]  [address]       Ph: [phone]       Fax: [phone]   RxID:   [id number]    Orders Added: 1)  TLB-A1C / Hgb A1C (Glycohemoglobin) [83036-A1C] 2)  Est. Patient Level III [73220]

## 2010-03-26 ENCOUNTER — Ambulatory Visit (INDEPENDENT_AMBULATORY_CARE_PROVIDER_SITE_OTHER): Payer: BC Managed Care – PPO | Admitting: Cardiology

## 2010-03-26 ENCOUNTER — Ambulatory Visit (INDEPENDENT_AMBULATORY_CARE_PROVIDER_SITE_OTHER): Payer: BC Managed Care – PPO

## 2010-03-26 ENCOUNTER — Telehealth: Payer: Self-pay

## 2010-03-26 DIAGNOSIS — R0989 Other specified symptoms and signs involving the circulatory and respiratory systems: Secondary | ICD-10-CM

## 2010-03-26 DIAGNOSIS — R079 Chest pain, unspecified: Secondary | ICD-10-CM

## 2010-03-26 NOTE — Telephone Encounter (Signed)
Pt called stating that since she her Insulin dosage was decrease she has been experiencing an increase inf CBG's. Pt states her average fasting was in the 100's now it has been in excess of 190. Pt is requesting to resume 220 units, please advise.

## 2010-03-26 NOTE — Telephone Encounter (Signed)
increase humalog to 40 units tid (just before each meal).  Call if cbg's stay high.

## 2010-03-26 NOTE — Progress Notes (Signed)
Exercise Treadmill Test  Pre-Exercise Testing Evaluation Rhythm: sinus tachycardia  Rate: 109   PR:  .14 QRS:  .09  QT:  .34 QTc: .46   P axis: normal QRS axis:  +45 degrees  ST Segments:  no significant ST changes at rest     Test  Exercise Tolerance Test Ordering MD: Angelina Sheriff, MD  Interpreting MD:  Angelina Sheriff, MD  Unique Test No: 1  Treadmill:  1  Indication for ETT: chest pain - rule out ischemia  Contraindication to ETT: No   Stress Modality: exercise - treadmill  Cardiac Imaging Performed: non   Protocol: standard Bruce - maximal  Max BP:  175/77  Max MPHR (bpm):  171 85% MPR (bpm):  145  MPHR obtained (bpm):  160 % MPHR obtained:  110  Reached 85% MPHR (min:sec):  3:20 Total Exercise Time (min-sec):  6:00  Workload in METS:  7.0 Borg Scale: 14  Reason ETT Terminated:  desired heart rate attained    ST Segment Analysis At Rest: normal ST segments - no evidence of significant ST depression With Exercise: no evidence of significant ST depression  Other Information Arrhythmia:  Yes PVCs Angina during ETT:  absent (0) Quality of ETT:  diagnostic  ETT Interpretation:  normal - no evidence of ischemia by ST analysis  Comments: Negative adequate ETT.  Moderate risk for future cardiovascular events.  Recommendations: The patient had an moderate exercise tolerance.  There was no chest pain.  There was an appropriate level of dyspnea.  There were PVCs early in exercise, a normal heart rate response and normal BP response.  There were no ischemic ST T wave changes and a abnormal heart rate recovery.

## 2010-03-27 NOTE — Telephone Encounter (Signed)
Pt advised via VM per pt request and informed of MD advisedment. Pt told to call back if CBG's remain elevated or any further questions or concerns

## 2010-04-07 ENCOUNTER — Other Ambulatory Visit: Payer: Self-pay

## 2010-04-07 MED ORDER — INSULIN GLARGINE 100 UNIT/ML ~~LOC~~ SOLN: 250.0000 [IU] | Freq: Every day | SUBCUTANEOUS | Status: DC

## 2010-04-09 ENCOUNTER — Other Ambulatory Visit: Payer: Self-pay | Admitting: Endocrinology

## 2010-04-09 MED ORDER — INSULIN GLARGINE 100 UNIT/ML ~~LOC~~ SOLN: 190.0000 [IU] | Freq: Every day | SUBCUTANEOUS | Status: DC

## 2010-04-09 NOTE — Telephone Encounter (Signed)
Rx faxed to Pharmacy by Triage B.

## 2010-04-09 NOTE — Telephone Encounter (Signed)
Rx printed to be faxed to [facility]-rx did not go through electronically.

## 2010-04-09 NOTE — Telephone Encounter (Signed)
R'cd fax from [facility] for refill of pt's Lantus.  Last OV-03/17/2010  Last Filled-12/18/2009

## 2010-04-09 NOTE — Telephone Encounter (Signed)
Addended by: Brenton Grills on: 04/09/2010 08:49 AM   Modules accepted: Orders

## 2010-04-13 ENCOUNTER — Other Ambulatory Visit: Payer: Self-pay

## 2010-04-13 MED ORDER — INSULIN GLARGINE 100 UNIT/ML ~~LOC~~ SOLN: 190.0000 [IU] | Freq: Every day | SUBCUTANEOUS | Status: DC

## 2010-04-14 NOTE — Telephone Encounter (Signed)
rx telephoned in to [facility] for Lantus Solostar (pens) 3 month's supply with 1 refill.

## 2010-05-04 ENCOUNTER — Other Ambulatory Visit: Payer: Self-pay

## 2010-05-04 MED ORDER — INSULIN LISPRO 100 UNIT/ML ~~LOC~~ SOLN: SUBCUTANEOUS | Status: DC

## 2010-05-04 MED ORDER — ATORVASTATIN CALCIUM 80 MG PO TABS: 80.0000 mg | ORAL_TABLET | Freq: Every day | ORAL | Status: DC

## 2010-05-05 ENCOUNTER — Other Ambulatory Visit: Payer: Self-pay

## 2010-05-05 MED ORDER — INSULIN LISPRO 100 UNIT/ML ~~LOC~~ SOLN: SUBCUTANEOUS | Status: DC

## 2010-05-26 ENCOUNTER — Other Ambulatory Visit: Payer: Self-pay | Admitting: *Deleted

## 2010-05-26 MED ORDER — INSULIN LISPRO 100 UNIT/ML ~~LOC~~ SOLN: SUBCUTANEOUS | Status: DC

## 2010-05-26 NOTE — Telephone Encounter (Signed)
R[facility]c [facility] requesting a 90 day supply of pt's Humalog

## 2010-06-23 ENCOUNTER — Other Ambulatory Visit (INDEPENDENT_AMBULATORY_CARE_PROVIDER_SITE_OTHER): Payer: BC Managed Care – PPO

## 2010-06-23 ENCOUNTER — Encounter: Payer: Self-pay | Admitting: Endocrinology

## 2010-06-23 ENCOUNTER — Ambulatory Visit (INDEPENDENT_AMBULATORY_CARE_PROVIDER_SITE_OTHER): Payer: BC Managed Care – PPO | Admitting: Endocrinology

## 2010-06-23 VITALS — BP 108/70 | HR 101 | Temp 98.9°F | Ht 66.0 in | Wt 231.2 lb

## 2010-06-23 DIAGNOSIS — E109 Type 1 diabetes mellitus without complications: Secondary | ICD-10-CM

## 2010-06-23 LAB — HEMOGLOBIN A1C: Hgb A1c MFr Bld: 8.8 % — ABNORMAL HIGH (ref 4.6–6.5)

## 2010-06-23 MED ORDER — INSULIN ASPART 100 UNIT/ML ~~LOC~~ SOLN: 50.0000 [IU] | Freq: Three times a day (TID) | SUBCUTANEOUS | Status: DC

## 2010-06-23 NOTE — Progress Notes (Signed)
  Subjective:    Patient ID: Catherine Moon, female    DOB: [birth year], 50 y.o.   MRN: [id number]  HPI no cbg record, but states cbg's are improved with the new insulin schedule.  It is in general lowest when she hasn't eaten for a few hrs.   pt states she feels well in general. Past Medical History  Diagnosis Date  . DM (diabetes mellitus)   . HTN (hypertension)   . Dyslipidemia   . Warts, genital   . Smoker   . CTS (carpal tunnel syndrome)   . Hypercholesterolemia     Past Surgical History  Procedure Date  . Dilation and curettage of uterus     History   Social History  . Marital Status: Married    Spouse Name: N/A    Number of Children: N/A  . Years of Education: N/A   Occupational History  . Not on file.   Social History Main Topics  . Smoking status: Former Games developer  . Smokeless tobacco: Not on file   Comment:  She smoked one pack per day for 26 years but quit in 2009.  Marland Kitchen Alcohol Use: Not on file  . Drug Use: Not on file  . Sexually Active: Not on file   Other Topics Concern  . Not on file   Social History Narrative   Works 3rd shift in [employer], married.  She smoked one pack per day for 26 years but quit in 2009.    Current Outpatient Prescriptions on File Prior to Visit  Medication Sig Dispense Refill  . aspirin 81 MG EC tablet Take 81 mg by mouth daily.        Marland Kitchen atorvastatin (LIPITOR) 80 MG tablet Take 1 tablet (80 mg total) by mouth daily.  90 tablet  1  . glucose blood (ONE TOUCH ULTRA TEST) test strip 3 (three) times daily. Use as instructed      . Insulin Pen Needle (BD ULTRA-FINE PEN NEEDLES) 29G X 12.7MM MISC 4 (four) times daily.       Marland Kitchen lisinopril-hydrochlorothiazide (PRINZIDE,ZESTORETIC) 20-12.5 MG per tablet 2 TABS PO QD       . DISCONTD: insulin glargine (LANTUS SOLOSTAR) 100 UNIT/ML injection Inject 190 Units into the skin daily.  171 mL  3    No Known Allergies  Family History  Problem Relation Age of Onset  . Breast  cancer Mother   . Heart disease Mother   . Cancer Mother     Breast  . Coronary artery disease Father     starting in his 67s. A bypass and mitral infarction  . Heart disease Father     Coronary Disease, bypass and mitral infarction    BP 108/70  Pulse 101  Temp(Src) 98.9 F (37.2 C) (Oral)  Ht 5\' 6"  (1.676 m)  Wt 231 lb 3.2 oz (104.872 kg)  BMI 37.32 kg/m2  SpO2 96% Review of Systems denies hypoglycemia.    Objective:   Physical Exam GENERAL: no distress SKIN:  Insulin injection sites at the anterior abdomen are normal.    Lab Results  Component Value Date   HGBA1C 8.8* 06/23/2010   Assessment & Plan:  Dm, needs increased rx

## 2010-06-23 NOTE — Patient Instructions (Addendum)
blood tests are being ordered for you today.  please call [phone] to hear your test results.  You will be prompted to enter the 9-digit "MRN" number that appears at the top left of this page, followed by #.  Then you will hear the message. pending the test results, please reduce lantus to 160 units each evening, and: increase novolog to 50 units with each meal.   Please make a follow-up appointment in 3 months. check your blood sugar 2 times a day.  vary the time of day when you check, between before the 3 meals, and at bedtime.  also check if you have symptoms of your blood sugar being too high or too low.  please keep a record of the readings and bring it to your next appointment here.  please call us sooner if you are having low blood sugar episodes. good diet and exercise habits significanly improve the control of your diabetes.  please let me know if you wish to be referred to a dietician.  high blood sugar is very risky to your health.  you should see an eye doctor every year. controlling your blood pressure and cholesterol drastically reduces the damage diabetes does to your body.  this also applies to quitting smoking.  please discuss these with your doctor.  you should take an aspirin every day, unless you have been advised by a doctor not to. (update: i left message on phone-tree:  Increase novolog to 55 units tid (qac).

## 2010-09-24 ENCOUNTER — Ambulatory Visit (INDEPENDENT_AMBULATORY_CARE_PROVIDER_SITE_OTHER): Payer: BC Managed Care – PPO | Admitting: Endocrinology

## 2010-09-24 ENCOUNTER — Encounter: Payer: Self-pay | Admitting: Endocrinology

## 2010-09-24 ENCOUNTER — Other Ambulatory Visit (INDEPENDENT_AMBULATORY_CARE_PROVIDER_SITE_OTHER): Payer: BC Managed Care – PPO

## 2010-09-24 VITALS — BP 130/72 | HR 99 | Temp 98.1°F | Ht 66.0 in | Wt 238.0 lb

## 2010-09-24 DIAGNOSIS — E109 Type 1 diabetes mellitus without complications: Secondary | ICD-10-CM

## 2010-09-24 MED ORDER — INSULIN ASPART 100 UNIT/ML ~~LOC~~ SOLN: 65.0000 [IU] | Freq: Three times a day (TID) | SUBCUTANEOUS | Status: DC

## 2010-09-24 NOTE — Progress Notes (Signed)
Subjective:    Patient ID: Catherine Moon, female    DOB: 1960/02/03, 50 y.o.   MRN: [id number]  HPI no cbg record, but states cbg's are still averaging in the high-100's.  She works 3rd shift.  She says cbg is in general higher after eating, than if she has not eaten within the past few hrs.   Past Medical History  Diagnosis Date  . DM (diabetes mellitus)   . HTN (hypertension)   . Dyslipidemia   . Warts, genital   . Smoker   . CTS (carpal tunnel syndrome)   . Hypercholesterolemia     Past Surgical History  Procedure Date  . Dilation and curettage of uterus     History   Social History  . Marital Status: Married    Spouse Name: N/A    Number of Children: N/A  . Years of Education: N/A   Occupational History  . Not on file.   Social History Main Topics  . Smoking status: Former Smoker -- 1.0 packs/day for 26 years    Types: Cigarettes    Quit date: 08/13/2007  . Smokeless tobacco: Not on file   Comment:  She smoked one pack per day for 26 years but quit in 2009.  Marland Kitchen Alcohol Use: Not on file  . Drug Use: Not on file  . Sexually Active: Not on file   Other Topics Concern  . Not on file   Social History Narrative   Works 3rd shift in [employer], married.  She smoked one pack per day for 26 years but quit in 2009.    Current Outpatient Prescriptions on File Prior to Visit  Medication Sig Dispense Refill  . aspirin 81 MG EC tablet Take 81 mg by mouth daily.        Marland Kitchen atorvastatin (LIPITOR) 80 MG tablet Take 1 tablet (80 mg total) by mouth daily.  90 tablet  1  . glucose blood (ONE TOUCH ULTRA TEST) test strip 3 (three) times daily. Use as instructed      . insulin glargine (LANTUS) 100 UNIT/ML injection Inject 160 Units into the skin daily.        . Insulin Pen Needle (BD ULTRA-FINE PEN NEEDLES) 29G X 12.7MM MISC 4 (four) times daily.       Marland Kitchen lisinopril-hydrochlorothiazide (PRINZIDE,ZESTORETIC) 20-12.5 MG per tablet 2 TABS PO QD         No Known  Allergies  Family History  Problem Relation Age of Onset  . Breast cancer Mother   . Heart disease Mother   . Cancer Mother     Breast  . Coronary artery disease Father     starting in his 21s. A bypass and mitral infarction  . Heart disease Father     Coronary Disease, bypass and mitral infarction    BP 130/72  Pulse 99  Temp(Src) 98.1 F (36.7 C) (Oral)  Ht 5\' 6"  (1.676 m)  Wt 238 lb (107.956 kg)  BMI 38.41 kg/m2  SpO2 95%  Review of Systems denies hypoglycemia.    Objective:   Physical Exam Pulses: dorsalis pedis intact bilat.   Feet: no deformity.  no ulcer on the feet.  feet are of normal color and temp.  1+ leg edema on the left, and trace on the right. Neuro: sensation is intact to touch on the feet.     Lab Results  Component Value Date   HGBA1C 8.6* 09/24/2010      Assessment & Plan:  Dm,  needs increased rx

## 2010-09-24 NOTE — Patient Instructions (Addendum)
blood tests are being ordered for you today.  please call [phone] to hear your test results.  You will be prompted to enter the 9-digit "MRN" number that appears at the top left of this page, followed by #.  Then you will hear the message. pending the test results, please continue lantus 160 units each evening, and: increase novolog to 65 units with each meal.   Please make a follow-up appointment in 3 months. check your blood sugar 2 times a day.  vary the time of day when you check, between before the 3 meals, and at bedtime.  also check if you have symptoms of your blood sugar being too high or too low.  please keep a record of the readings and bring it to your next appointment here.  please call us sooner if you are having low blood sugar episodes.   (update: i left message on phone-tree:  rx as we discussed)

## 2010-11-01 ENCOUNTER — Other Ambulatory Visit: Payer: Self-pay | Admitting: Endocrinology

## 2010-11-09 ENCOUNTER — Other Ambulatory Visit: Payer: Self-pay | Admitting: Endocrinology

## 2010-11-20 ENCOUNTER — Other Ambulatory Visit: Payer: Self-pay | Admitting: *Deleted

## 2010-11-20 MED ORDER — LISINOPRIL-HYDROCHLOROTHIAZIDE 20-12.5 MG PO TABS: 2.0000 | ORAL_TABLET | Freq: Every day | ORAL | Status: DC

## 2010-11-20 NOTE — Telephone Encounter (Signed)
R'cd fax from [facility] for refill of Lisinopril/HCTZ.

## 2011-03-21 ENCOUNTER — Other Ambulatory Visit: Payer: Self-pay | Admitting: Endocrinology

## 2011-05-13 ENCOUNTER — Encounter: Payer: Self-pay | Admitting: Endocrinology

## 2011-05-13 ENCOUNTER — Ambulatory Visit (INDEPENDENT_AMBULATORY_CARE_PROVIDER_SITE_OTHER): Payer: BC Managed Care – PPO | Admitting: Endocrinology

## 2011-05-13 ENCOUNTER — Other Ambulatory Visit (INDEPENDENT_AMBULATORY_CARE_PROVIDER_SITE_OTHER): Payer: BC Managed Care – PPO

## 2011-05-13 VITALS — BP 148/88 | HR 86 | Temp 98.4°F | Ht 66.0 in | Wt 238.0 lb

## 2011-05-13 DIAGNOSIS — E109 Type 1 diabetes mellitus without complications: Secondary | ICD-10-CM

## 2011-05-13 LAB — HEMOGLOBIN A1C: Hgb A1c MFr Bld: 8.5 % — ABNORMAL HIGH (ref 4.6–6.5)

## 2011-05-13 MED ORDER — ATORVASTATIN CALCIUM 80 MG PO TABS: ORAL_TABLET | ORAL | Status: DC

## 2011-05-13 MED ORDER — INSULIN GLARGINE 100 UNIT/ML ~~LOC~~ SOLN: SUBCUTANEOUS | Status: DC

## 2011-05-13 MED ORDER — INSULIN ASPART 100 UNIT/ML ~~LOC~~ SOLN: 65.0000 [IU] | Freq: Three times a day (TID) | SUBCUTANEOUS | Status: DC

## 2011-05-13 MED ORDER — INSULIN PEN NEEDLE 29G X 12.7MM MISC: Status: DC

## 2011-05-13 MED ORDER — LISINOPRIL-HYDROCHLOROTHIAZIDE 20-12.5 MG PO TABS: 2.0000 | ORAL_TABLET | Freq: Every day | ORAL | Status: DC

## 2011-05-13 NOTE — Patient Instructions (Addendum)
blood tests are being requested for you today.  You will receive a letter with results. pending the test results, please continue the same insulin for now.   check your blood sugar 2 times a day.  vary the time of day when you check, between before the 3 meals, and at bedtime.  also check if you have symptoms of your blood sugar being too high or too low.  please keep a record of the readings and bring it to your next appointment here.  please call us sooner if you are having low blood sugar episodes.   Please come back for a regular physical appointment in 3 months.  We'll recheck the blood pressure then.

## 2011-05-13 NOTE — Progress Notes (Signed)
Subjective:    Patient ID: Catherine Moon, female    DOB: 1960-06-06, 51 y.o.   MRN: [id number]  HPI Pt returns for f/u of IDDM (dx'ed 1997; no known complications).  It is higher if she has eaten recently, than if the last meal was some hrs ago (works 3rd shift).   Past Medical History  Diagnosis Date  . DM (diabetes mellitus)   . HTN (hypertension)   . Dyslipidemia   . Warts, genital   . Smoker   . CTS (carpal tunnel syndrome)   . Hypercholesterolemia     Past Surgical History  Procedure Date  . Dilation and curettage of uterus     History   Social History  . Marital Status: Married    Spouse Name: N/A    Number of Children: N/A  . Years of Education: N/A   Occupational History  . Not on file.   Social History Main Topics  . Smoking status: Former Smoker -- 1.0 packs/day for 26 years    Types: Cigarettes    Quit date: 08/13/2007  . Smokeless tobacco: Not on file   Comment:  She smoked one pack per day for 26 years but quit in 2009.  Marland Kitchen Alcohol Use: Not on file  . Drug Use: Not on file  . Sexually Active: Not on file   Other Topics Concern  . Not on file   Social History Narrative   Works 3rd shift in [employer], married.  She smoked one pack per day for 26 years but quit in 2009.    Current Outpatient Prescriptions on File Prior to Visit  Medication Sig Dispense Refill  . aspirin 81 MG EC tablet Take 81 mg by mouth daily.        Marland Kitchen atorvastatin (LIPITOR) 80 MG tablet TAKE 1 TABLET BY MOUTH DAILY  90 tablet  3  . glucose blood (ONE TOUCH ULTRA TEST) test strip 3 (three) times daily. Use as instructed      . insulin glargine (LANTUS SOLOSTAR) 100 UNIT/ML injection INJECT 190 UNITS SUBQ EVERY DAY  180 mL  3  . lisinopril-hydrochlorothiazide (PRINZIDE,ZESTORETIC) 20-12.5 MG per tablet Take 2 tablets by mouth daily.  180 tablet  3  . DISCONTD: insulin aspart (NOVOLOG FLEXPEN) 100 UNIT/ML injection Inject 50 Units into the skin 3 (three) times daily  before meals.  60 mL  12    No Known Allergies  Family History  Problem Relation Age of Onset  . Breast cancer Mother   . Heart disease Mother   . Cancer Mother     Breast  . Coronary artery disease Father     starting in his 68s. A bypass and mitral infarction  . Heart disease Father     Coronary Disease, bypass and mitral infarction    BP 148/88  Pulse 86  Temp(Src) 98.4 F (36.9 C) (Oral)  Ht 5\' 6"  (1.676 m)  Wt 238 lb (107.956 kg)  BMI 38.41 kg/m2  SpO2 96%  Review of Systems denies hypoglycemia.    Objective:   Physical Exam VITAL SIGNS:  See vs page GENERAL: no distress Pulses: dorsalis pedis intact bilat.   Feet: no deformity.  no ulcer on the feet.  feet are of normal color and temp.  Trace bilat leg edema Neuro: sensation is intact to touch on the feet.  Lab Results  Component Value Date   HGBA1C 8.5* 05/13/2011      Assessment & Plan:  DM, needs increased rx HTN,  with ? Of situational component

## 2011-05-14 ENCOUNTER — Telehealth: Payer: Self-pay | Admitting: *Deleted

## 2011-05-14 NOTE — Telephone Encounter (Signed)
Called pt to inform of lab results, pt informed via message (letter also mailed to pt).

## 2011-05-26 ENCOUNTER — Telehealth: Payer: Self-pay | Admitting: *Deleted

## 2011-05-26 DIAGNOSIS — E109 Type 1 diabetes mellitus without complications: Secondary | ICD-10-CM

## 2011-05-26 DIAGNOSIS — Z0000 Encounter for general adult medical examination without abnormal findings: Secondary | ICD-10-CM

## 2011-05-26 NOTE — Telephone Encounter (Signed)
Message copied by Carin Primrose on Wed May 26, 2011  9:59 AM ------      Message from: Etheleen Sia      Created: Thu May 13, 2011  4:18 PM      Regarding: PHYSICAL LABS       PHYSICAL LABS ( A1C AND MALB?) FOR AUG 20 APPT

## 2011-05-26 NOTE — Telephone Encounter (Signed)
CPX labs entered into Epic for upcoming CPX appointment.

## 2011-08-24 ENCOUNTER — Encounter: Payer: BC Managed Care – PPO | Admitting: Endocrinology

## 2011-09-14 ENCOUNTER — Encounter: Payer: BC Managed Care – PPO | Admitting: Endocrinology

## 2011-09-20 ENCOUNTER — Other Ambulatory Visit (INDEPENDENT_AMBULATORY_CARE_PROVIDER_SITE_OTHER): Payer: BC Managed Care – PPO

## 2011-09-20 DIAGNOSIS — E109 Type 1 diabetes mellitus without complications: Secondary | ICD-10-CM

## 2011-09-20 DIAGNOSIS — Z0000 Encounter for general adult medical examination without abnormal findings: Secondary | ICD-10-CM

## 2011-09-20 LAB — CBC WITH DIFFERENTIAL/PLATELET
Basophils Absolute: 0.1 10*3/uL (ref 0.0–0.1)
Lymphocytes Relative: 28.2 % (ref 12.0–46.0)
Monocytes Relative: 9 % (ref 3.0–12.0)
Platelets: 432 10*3/uL — ABNORMAL HIGH (ref 150.0–400.0)
RDW: 13.5 % (ref 11.5–14.6)

## 2011-09-20 LAB — URINALYSIS, ROUTINE W REFLEX MICROSCOPIC
Bilirubin Urine: NEGATIVE
Ketones, ur: NEGATIVE
Leukocytes, UA: NEGATIVE
Urobilinogen, UA: 0.2 (ref 0.0–1.0)
pH: 6 (ref 5.0–8.0)

## 2011-09-20 LAB — LIPID PANEL
HDL: 40.8 mg/dL (ref >39.00)
LDL Cholesterol: 109 mg/dL — ABNORMAL HIGH (ref 0–99)
Total CHOL/HDL Ratio: 4
Triglycerides: 130 mg/dL (ref 0.0–149.0)

## 2011-09-20 LAB — BASIC METABOLIC PANEL
CO2: 25 mEq/L (ref 19–32)
Chloride: 105 mEq/L (ref 96–112)
Potassium: 3.6 mEq/L (ref 3.5–5.1)
Sodium: 138 mEq/L (ref 135–145)

## 2011-09-20 LAB — TSH: TSH: 2.1 u[IU]/mL (ref 0.35–5.50)

## 2011-09-20 LAB — HEPATIC FUNCTION PANEL
ALT: 23 U/L (ref 0–35)
Bilirubin, Direct: 0.1 mg/dL (ref 0.0–0.3)
Total Protein: 7.3 g/dL (ref 6.0–8.3)

## 2011-09-20 LAB — MICROALBUMIN / CREATININE URINE RATIO: Microalb, Ur: 0.2 mg/dL (ref 0.0–1.9)

## 2011-09-23 ENCOUNTER — Ambulatory Visit (INDEPENDENT_AMBULATORY_CARE_PROVIDER_SITE_OTHER): Payer: BC Managed Care – PPO | Admitting: Endocrinology

## 2011-09-23 ENCOUNTER — Encounter: Payer: Self-pay | Admitting: Endocrinology

## 2011-09-23 VITALS — BP 150/80 | HR 94 | Temp 97.4°F | Resp 16 | Ht 66.5 in | Wt 239.0 lb

## 2011-09-23 DIAGNOSIS — R079 Chest pain, unspecified: Secondary | ICD-10-CM

## 2011-09-23 DIAGNOSIS — Z23 Encounter for immunization: Secondary | ICD-10-CM

## 2011-09-23 DIAGNOSIS — N951 Menopausal and female climacteric states: Secondary | ICD-10-CM | POA: Insufficient documentation

## 2011-09-23 DIAGNOSIS — E109 Type 1 diabetes mellitus without complications: Secondary | ICD-10-CM

## 2011-09-23 MED ORDER — DILTIAZEM HCL ER 120 MG PO CP24: 120.0000 mg | ORAL_CAPSULE | Freq: Every day | ORAL | Status: DC

## 2011-09-23 MED ORDER — ATORVASTATIN CALCIUM 40 MG PO TABS: 40.0000 mg | ORAL_TABLET | Freq: Every day | ORAL | Status: DC

## 2011-09-23 MED ORDER — GLUCOSE BLOOD VI STRP: 1.0000 | ORAL_STRIP | Freq: Three times a day (TID) | Status: DC

## 2011-09-23 NOTE — Patient Instructions (Addendum)
Reduce lantus to 180 units daily Increase novolog to 90 units 3 times a day (just before each meal).  However, if you are going to be active, take just 50 units.   i have sent a prescription to your pharmacy, for an additional blood-pressure medication.   Reduce lipitor to 40 mg daily. Please come back for a follow-up appointment in 4 months.   please consider these measures for your health:  minimize alcohol.  do not use tobacco products.  have a colonoscopy at least every 10 years from age 75.  Women should have an annual mammogram from age 67.  keep firearms safely stored.  always use seat belts.  have working smoke alarms in your home.  see an eye doctor and dentist regularly.  never drive under the influence of alcohol or drugs (including prescription drugs).  those with fair skin should take precautions against the sun.

## 2011-09-23 NOTE — Progress Notes (Signed)
Subjective:    Patient ID: Catherine Moon, female    DOB: 1960/04/28, 51 y.o.   MRN: [id number]  HPI here for regular wellness examination.  He's feeling pretty well in general, and says chronic med probs are stable, except as noted below. Past Medical History  Diagnosis Date  . DM (diabetes mellitus)   . HTN (hypertension)   . Dyslipidemia   . Warts, genital   . Smoker   . CTS (carpal tunnel syndrome)   . Hypercholesterolemia     Past Surgical History  Procedure Date  . Dilation and curettage of uterus     History   Social History  . Marital Status: Married    Spouse Name: N/A    Number of Children: N/A  . Years of Education: N/A   Occupational History  . Not on file.   Social History Main Topics  . Smoking status: Former Smoker -- 1.0 packs/day for 26 years    Types: Cigarettes    Quit date: 08/13/2007  . Smokeless tobacco: Not on file   Comment:  She smoked one pack per day for 26 years but quit in 2009.  Marland Kitchen Alcohol Use: Not on file  . Drug Use: Not on file  . Sexually Active: Not on file   Other Topics Concern  . Not on file   Social History Narrative   Works 3rd shift in [employer], married.  She smoked one pack per day for 26 years but quit in 2009.    Current Outpatient Prescriptions on File Prior to Visit  Medication Sig Dispense Refill  . aspirin 81 MG EC tablet Take 81 mg by mouth daily.        Marland Kitchen glucose blood (ONE TOUCH ULTRA TEST) test strip 3 (three) times daily. Use as instructed      . insulin aspart (NOVOLOG) 100 UNIT/ML injection Inject 90 Units into the skin 3 (three) times daily before meals.       . Insulin Pen Needle (BD ULTRA-FINE PEN NEEDLES) 29G X 12.7MM MISC USE 5 DAILY AS DIRECTED  500 each  3  . lisinopril-hydrochlorothiazide (PRINZIDE,ZESTORETIC) 20-12.5 MG per tablet Take 2 tablets by mouth daily.  180 tablet  3  . DISCONTD: insulin glargine (LANTUS SOLOSTAR) 100 UNIT/ML injection INJECT 190 UNITS SUBQ EVERY DAY  180  mL  3  . diltiazem (DILACOR XR) 120 MG 24 hr capsule Take 1 capsule (120 mg total) by mouth daily.  90 capsule  3    No Known Allergies  Family History  Problem Relation Age of Onset  . Breast cancer Mother   . Heart disease Mother   . Cancer Mother     Breast  . Coronary artery disease Father     starting in his 28s. A bypass and mitral infarction  . Heart disease Father     Coronary Disease, bypass and mitral infarction    BP 150/80  Pulse 94  Temp 97.4 F (36.3 C) (Oral)  Resp 16  Ht 5' 6.5" (1.689 m)  Wt 239 lb (108.41 kg)  BMI 38.00 kg/m2  SpO2 96%    Review of Systems  Constitutional: Negative for fever.  HENT: Negative for hearing loss.   Eyes: Negative for visual disturbance.  Respiratory: Negative for shortness of breath.   Cardiovascular: Negative for chest pain.  Gastrointestinal: Negative for anal bleeding.  Genitourinary: Negative for hematuria.  Musculoskeletal: Negative for back pain.  Skin: Negative for rash.  Neurological: Negative for syncope.  Hematological:  Does not bruise/bleed easily.  Psychiatric/Behavioral: Negative for dysphoric mood.       Objective:   Physical Exam VS: see vs page GEN: no distress HEAD: head: no deformity eyes: no periorbital swelling, no proptosis external nose and ears are normal mouth: no lesion seen NECK: supple, thyroid is not enlarged CHEST WALL: no deformity LUNGS:  Clear to auscultation BREASTS:  sees gyn CV: reg rate and rhythm, no murmur ABD: abdomen is soft, nontender.  no hepatosplenomegaly.  not distended.  no hernia GENITALIA/RECTAL: sees gyn MUSCULOSKELETAL: muscle bulk and strength are grossly normal.  no obvious joint swelling.  gait is normal and steady EXTEMITIES: no deformity.  no ulcer on the feet.  feet are of normal color and temp.  no edema PULSES: dorsalis pedis intact bilat.  no carotid bruit NEURO:  cn 2-12 grossly intact.   readily moves all 4's.  sensation is intact to touch on the  feet SKIN:  Normal texture and temperature.  No rash or suspicious lesion is visible.   NODES:  None palpable at the neck PSYCH: alert, oriented x3.  Does not appear anxious nor depressed.        Assessment & Plan:  Wellness visit today, with problems stable, except as noted.    SEPARATE EVALUATION FOLLOWS--EACH PROBLEM HERE IS NEW, NOT RESPONDING TO TREATMENT, OR POSES SIGNIFICANT RISK TO THE PATIENT'S HEALTH: HISTORY OF THE PRESENT ILLNESS: The state of at least three ongoing medical problems is addressed today: DM: no cbg record, but states cbg's was low only once, and this was with exertion.   HTN: she takes meds as rx'ed, and tolerates well Dyslipidemia: she takes lipitor as rx'ed, and tolerates well PAST MEDICAL HISTORY reviewed and up to date today REVIEW OF SYSTEMS: Denies weight change and numbness PHYSICAL EXAMINATION: VITAL SIGNS:  See vs page GENERAL: no distress LAB/XRAY RESULTS: Lab Results  Component Value Date   WBC 12.3* 09/20/2011   HGB 13.4 09/20/2011   HCT 40.6 09/20/2011   PLT 432.0* 09/20/2011   GLUCOSE 125* 09/20/2011   CHOL 176 09/20/2011   TRIG 130.0 09/20/2011   HDL 40.80 09/20/2011   LDLDIRECT 173.9 11/18/2009   LDLCALC 109* 09/20/2011   ALT 23 09/20/2011   AST 21 09/20/2011   NA 138 09/20/2011   K 3.6 09/20/2011   CL 105 09/20/2011   CREATININE 0.6 09/20/2011   BUN 11 09/20/2011   CO2 25 09/20/2011   TSH 2.10 09/20/2011   HGBA1C 7.6* 09/20/2011   MICROALBUR 0.2 09/20/2011  IMPRESSION: HTN: needs increased rx Dyslipidemia.  There is an interaction between cardizem and lipitor DM: Based on the pattern of her cbg's, she needs some adjustment in her therapy PLAN: See instruction page

## 2011-10-07 ENCOUNTER — Ambulatory Visit (INDEPENDENT_AMBULATORY_CARE_PROVIDER_SITE_OTHER)
Admission: RE | Admit: 2011-10-07 | Discharge: 2011-10-07 | Disposition: A | Discharge status: Home / Self Care | Payer: BC Managed Care – PPO | Source: Ambulatory Visit

## 2011-10-07 DIAGNOSIS — N951 Menopausal and female climacteric states: Secondary | ICD-10-CM

## 2011-11-22 ENCOUNTER — Telehealth: Payer: Self-pay | Admitting: Endocrinology

## 2011-11-22 NOTE — Telephone Encounter (Signed)
Left message on patient home and cell # of need to call office to schedule appt. With Dr, Everardo All concerning heel pain. sue

## 2011-11-22 NOTE — Telephone Encounter (Signed)
Pt states she has been having pain in her heel. She isn't sure if she needs to see Dr. Everardo All or if she needs a referral to podiatry. Please advise.

## 2011-11-22 NOTE — Telephone Encounter (Signed)
Please come in for appt, and i would be happy to help you.

## 2011-11-23 ENCOUNTER — Encounter: Payer: Self-pay | Admitting: Endocrinology

## 2011-11-23 ENCOUNTER — Other Ambulatory Visit: Payer: Self-pay | Admitting: Endocrinology

## 2011-11-23 ENCOUNTER — Ambulatory Visit (INDEPENDENT_AMBULATORY_CARE_PROVIDER_SITE_OTHER): Payer: BC Managed Care – PPO | Admitting: Endocrinology

## 2011-11-23 ENCOUNTER — Ambulatory Visit
Admission: RE | Admit: 2011-11-23 | Discharge: 2011-11-23 | Disposition: A | Discharge status: Home / Self Care | Payer: BC Managed Care – PPO | Source: Ambulatory Visit | Attending: Endocrinology | Admitting: Endocrinology

## 2011-11-23 VITALS — BP 122/70 | HR 104 | Temp 98.2°F | Wt 244.0 lb

## 2011-11-23 DIAGNOSIS — M79672 Pain in left foot: Secondary | ICD-10-CM | POA: Insufficient documentation

## 2011-11-23 DIAGNOSIS — M79609 Pain in unspecified limb: Secondary | ICD-10-CM

## 2011-11-23 NOTE — Patient Instructions (Addendum)
X-rays are requested for you today.  We'll contact you with results. Try taking aleve+prilosec otc Refer to a foot specialist.  you will receive a phone call, about a day and time for an appointment.

## 2011-11-23 NOTE — Progress Notes (Signed)
Subjective:    Patient ID: Catherine Moon, female    DOB: [birth year], 51 y.o.   MRN: [id number]  HPI Pt states 6 weeks of moderate pain at the plantar aspect of the left heel.  No assoc numbness.   Past Medical History  Diagnosis Date  . DM (diabetes mellitus)   . HTN (hypertension)   . Dyslipidemia   . Warts, genital   . Smoker   . CTS (carpal tunnel syndrome)   . Hypercholesterolemia     Past Surgical History  Procedure Date  . Dilation and curettage of uterus     History   Social History  . Marital Status: Married    Spouse Name: N/A    Number of Children: N/A  . Years of Education: N/A   Occupational History  . Not on file.   Social History Main Topics  . Smoking status: Former Smoker -- 1.0 packs/day for 26 years    Types: Cigarettes    Quit date: 08/13/2007  . Smokeless tobacco: Not on file     Comment:  She smoked one pack per day for 26 years but quit in 2009.  Marland Kitchen Alcohol Use: Not on file  . Drug Use: Not on file  . Sexually Active: Not on file   Other Topics Concern  . Not on file   Social History Narrative   Works 3rd shift in [employer], married.  She smoked one pack per day for 26 years but quit in 2009.    Current Outpatient Prescriptions on File Prior to Visit  Medication Sig Dispense Refill  . aspirin 81 MG EC tablet Take 81 mg by mouth daily.        Marland Kitchen atorvastatin (LIPITOR) 40 MG tablet Take 1 tablet (40 mg total) by mouth daily.  90 tablet  3  . diltiazem (DILACOR XR) 120 MG 24 hr capsule Take 1 capsule (120 mg total) by mouth daily.  90 capsule  3  . glucose blood (ONE TOUCH ULTRA TEST) test strip 1 each by Other route 3 (three) times daily. And lancets 3/day 250.01  300 each  3  . insulin aspart (NOVOLOG) 100 UNIT/ML injection Inject 90 Units into the skin 3 (three) times daily before meals.       . insulin glargine (LANTUS) 100 UNIT/ML injection INJECT 180 UNITS SUBQ EVERY DAY      . Insulin Pen Needle (BD ULTRA-FINE PEN  NEEDLES) 29G X 12.7MM MISC USE 5 DAILY AS DIRECTED  500 each  3  . lisinopril-hydrochlorothiazide (PRINZIDE,ZESTORETIC) 20-12.5 MG per tablet Take 2 tablets by mouth daily.  180 tablet  3    No Known Allergies  Family History  Problem Relation Age of Onset  . Breast cancer Mother   . Heart disease Mother   . Cancer Mother     Breast  . Coronary artery disease Father     starting in his 76s. A bypass and mitral infarction  . Heart disease Father     Coronary Disease, bypass and mitral infarction   BP 122/70  Pulse 104  Temp 98.2 F (36.8 C) (Oral)  Wt 244 lb (110.678 kg)  SpO2 96%  Review of Systems Denies rash and edema    Objective:   Physical Exam VITAL SIGNS:  See vs page GENERAL: no distress Pulses: dorsalis pedis intact bilat.   Feet: no deformity.  no ulcer on the feet.  feet are of normal color and temp.  Trace bilat leg edema Neuro: sensation  is intact to touch on the feet   (i reviewed x-ray result)    Assessment & Plan:  Foot pain, possibly due to heel spur

## 2012-02-05 ENCOUNTER — Ambulatory Visit: Payer: 59 | Admitting: Family Medicine

## 2012-02-05 ENCOUNTER — Ambulatory Visit: Payer: 59

## 2012-02-05 VITALS — BP 169/83 | HR 97 | Temp 98.8°F | Resp 30 | Ht 65.25 in | Wt 238.0 lb

## 2012-02-05 DIAGNOSIS — E119 Type 2 diabetes mellitus without complications: Secondary | ICD-10-CM

## 2012-02-05 DIAGNOSIS — R05 Cough: Secondary | ICD-10-CM

## 2012-02-05 DIAGNOSIS — J209 Acute bronchitis, unspecified: Secondary | ICD-10-CM

## 2012-02-05 DIAGNOSIS — J0110 Acute frontal sinusitis, unspecified: Secondary | ICD-10-CM

## 2012-02-05 DIAGNOSIS — B354 Tinea corporis: Secondary | ICD-10-CM

## 2012-02-05 LAB — POCT CBC
Granulocyte percent: 62.2 %G (ref 37–80)
HCT, POC: 43.1 % (ref 37.7–47.9)
MCH, POC: 29.5 pg (ref 27–31.2)
MCV: 93.4 fL (ref 80–97)
POC LYMPH PERCENT: 30.1 %L (ref 10–50)
RBC: 4.61 M/uL (ref 4.04–5.48)
WBC: 13.1 10*3/uL — AB (ref 4.6–10.2)

## 2012-02-05 MED ORDER — IPRATROPIUM BROMIDE 0.03 % NA SOLN: 2.0000 | Freq: Two times a day (BID) | NASAL | Status: DC

## 2012-02-05 MED ORDER — AZITHROMYCIN 250 MG PO TABS: ORAL_TABLET | ORAL | Status: DC

## 2012-02-05 MED ORDER — MUCINEX DM 30-600 MG PO TB12: 1.0000 | ORAL_TABLET | Freq: Two times a day (BID) | ORAL | Status: DC | PRN

## 2012-02-05 NOTE — Progress Notes (Signed)
[address]   [place], [place]  [address]   [phone]  Subjective:    Patient ID: Catherine Moon, female    DOB: [birth year], 52 y.o.   MRN: [id number]  HPIThis 52 y.o. female presents for evaluation of cold symptoms.  Onset eight days ago. Started with sore throat and went into sinuses and now in chest.  Also has drainage in eyes.  No fever; +sweats; no chills.  +HA.  +ST continued but mild.  No ear pain.  No rhinorrhea.  +nasal congestion; +PND.  Drainage yellow-bloody. Coughing up greenish mucous.  +SOB but improved.  No n/v/d.  S/p flu vaccine.  No body aches.  Mucinex every four hours.  Works nights; Retail buyer.  Sugars running 120-130; last checked two days ago.    2.  Ringworm on R anterior chest: caught from cat; using OTC Lotrimin cream; improving.   Review of Systems  Constitutional: Positive for diaphoresis. Negative for fever and chills.  HENT: Positive for congestion, sore throat, voice change, postnasal drip and sinus pressure. Negative for ear pain, rhinorrhea, mouth sores and trouble swallowing.   Respiratory: Positive for cough and shortness of breath. Negative for wheezing and stridor.   Gastrointestinal: Negative for nausea, vomiting, abdominal pain and diarrhea.  Skin: Positive for color change and rash. Negative for wound.  Neurological: Positive for headaches. Negative for dizziness and light-headedness.    Past Medical History  Diagnosis Date  . DM (diabetes mellitus)   . HTN (hypertension)   . Dyslipidemia   . Warts, genital   . Smoker   . CTS (carpal tunnel syndrome)   . Hypercholesterolemia     Past Surgical History  Procedure Date  . Dilation and curettage of uterus     Prior to Admission medications   Medication Sig Start Date End Date Taking? Authorizing Provider  aspirin 81 MG EC tablet Take 81 mg by mouth daily.     Yes Historical Provider, MD  atorvastatin (LIPITOR) 40 MG tablet Take 1 tablet (40 mg total) by mouth daily. 09/23/11   Yes Romero Belling, MD  diltiazem (DILACOR XR) 120 MG 24 hr capsule Take 1 capsule (120 mg total) by mouth daily. 09/23/11  Yes Romero Belling, MD  glucose blood (ONE TOUCH ULTRA TEST) test strip 1 each by Other route 3 (three) times daily. And lancets 3/day 250.01 09/23/11  Yes Romero Belling, MD  insulin aspart (NOVOLOG) 100 UNIT/ML injection Inject 90 Units into the skin 3 (three) times daily before meals.  05/13/11 05/12/12 Yes Romero Belling, MD  insulin glargine (LANTUS) 100 UNIT/ML injection INJECT 180 UNITS SUBQ EVERY DAY 05/13/11  Yes Romero Belling, MD  Insulin Pen Needle (BD ULTRA-FINE PEN NEEDLES) 29G X 12.7MM MISC USE 5 DAILY AS DIRECTED 05/13/11  Yes Romero Belling, MD  lisinopril-hydrochlorothiazide (PRINZIDE,ZESTORETIC) 20-12.5 MG per tablet Take 2 tablets by mouth daily. 05/13/11  Yes Romero Belling, MD    No Known Allergies  History   Social History  . Marital Status: Married    Spouse Name: N/A    Number of Children: N/A  . Years of Education: N/A   Occupational History  . Not on file.   Social History Main Topics  . Smoking status: Former Smoker -- 1.0 packs/day for 26 years    Types: Cigarettes    Quit date: 08/13/2007  . Smokeless tobacco: Never Used     Comment:  She smoked one pack per day for 26 years but quit in 2009.  Marland Kitchen Alcohol  Use: Not on file  . Drug Use: Not on file  . Sexually Active: Not on file   Other Topics Concern  . Not on file   Social History Narrative   Works 3rd shift in [employer], married.  She smoked one pack per day for 26 years but quit in 2009.    Family History  Problem Relation Age of Onset  . Breast cancer Mother   . Heart disease Mother   . Cancer Mother     Breast  . Coronary artery disease Father     starting in his 70s. A bypass and mitral infarction  . Heart disease Father     Coronary Disease, bypass and mitral infarction       Objective:   Physical Exam  Nursing note and vitals reviewed. Constitutional: She is oriented  to person, place, and time. She appears well-developed and well-nourished. No distress.  HENT:  Head: Normocephalic and atraumatic.  Right Ear: External ear normal.  Left Ear: External ear normal.  Nose: Mucosal edema and rhinorrhea present. Right sinus exhibits frontal sinus tenderness. Right sinus exhibits no maxillary sinus tenderness. Left sinus exhibits frontal sinus tenderness. Left sinus exhibits no maxillary sinus tenderness.  Mouth/Throat: Oropharynx is clear and moist.  Eyes: Conjunctivae normal are normal. Pupils are equal, round, and reactive to light.  Neck: Normal range of motion. Neck supple.  Cardiovascular: Normal rate, regular rhythm and normal heart sounds.   No murmur heard. Pulmonary/Chest: Effort normal and breath sounds normal. No accessory muscle usage. Not tachypneic. No respiratory distress. She has no decreased breath sounds. She has no wheezes. She has no rales.  Lymphadenopathy:    She has cervical adenopathy.  Neurological: She is alert and oriented to person, place, and time.  Skin: Rash noted. She is not diaphoretic.       R ANTERIOR RASH ANNULAR 1 CM MILD/FAINT.  Psychiatric: She has a normal mood and affect. Her behavior is normal. Judgment and thought content normal.    Results for orders placed in visit on 02/05/12  POCT CBC      Component Value Range   WBC 13.1 (*) 4.6 - 10.2 K/uL   Lymph, poc 3.9 (*) 0.6 - 3.4   POC LYMPH PERCENT 30.1  10 - 50 %L   MID (cbc) 1.0 (*) 0 - 0.9   POC MID % 7.7  0 - 12 %M   POC Granulocyte 8.1 (*) 2 - 6.9   Granulocyte percent 62.2  37 - 80 %G   RBC 4.61  4.04 - 5.48 M/uL   Hemoglobin 13.6  12.2 - 16.2 g/dL   HCT, POC 16.1  09.6 - 47.9 %   MCV 93.4  80 - 97 fL   MCH, POC 29.5  27 - 31.2 pg   MCHC 31.6 (*) 31.8 - 35.4 g/dL   RDW, POC 04.5     Platelet Count, POC 498 (*) 142 - 424 K/uL   MPV 7.2  0 - 99.8 fL  GLUCOSE, POCT (MANUAL RESULT ENTRY)      Component Value Range   POC Glucose 83  70 - 99 mg/dl     [facility] reading (PRIMARY) by  Dr. Katrinka Blazing. CXR: NAD.     Assessment & Plan:   1. Cough  DG Chest 2 View, POCT CBC  2. Diabetes  POCT glucose (manual entry)  3. Acute bronchitis    4. Tinea corporis      1.  Acute Bronchitis:  New.  Rx for Zithromax 500mg  daily; rx for Atrovent nasal spray and Mucinex DM bid.  RTC for acute worsening. 2. DMII: controlled; monitor sugars closely with acute illness. 3.  Tinea Corporis: New.  Improving with OTC Lotrimin.  No change in management.  Meds ordered this encounter  Medications  . azithromycin (ZITHROMAX) 250 MG tablet    Sig: Two tablets daily x 5 days    Dispense:  10 tablet    Refill:  0  . ipratropium (ATROVENT) 0.03 % nasal spray    Sig: Place 2 sprays into the nose 2 (two) times daily.    Dispense:  30 mL    Refill:  5  . Dextromethorphan-Guaifenesin (MUCINEX DM) 30-600 MG TB12    Sig: Take 1 tablet by mouth 2 (two) times daily as needed.    Dispense:  28 each    Refill:  0

## 2012-02-05 NOTE — Patient Instructions (Addendum)
1. Cough  DG Chest 2 View, POCT CBC  2. Diabetes  POCT glucose (manual entry)  3. Acute bronchitis    4. Tinea corporis    5. Sinusitis, acute frontal

## 2012-03-07 ENCOUNTER — Other Ambulatory Visit: Payer: Self-pay | Admitting: Endocrinology

## 2012-03-07 MED ORDER — INSULIN LISPRO 100 UNIT/ML ~~LOC~~ SOLN: 90.0000 [IU] | Freq: Three times a day (TID) | SUBCUTANEOUS | Status: DC

## 2012-03-10 ENCOUNTER — Other Ambulatory Visit: Payer: Self-pay | Admitting: Endocrinology

## 2012-03-15 ENCOUNTER — Telehealth: Payer: Self-pay

## 2012-03-15 NOTE — Telephone Encounter (Signed)
Left message on machine pt to cal back if she is not going to use [facility] pharmacy and use [facility] instead, will need to obtain a pa for Humalog kwik pen or vial

## 2012-03-16 ENCOUNTER — Ambulatory Visit (INDEPENDENT_AMBULATORY_CARE_PROVIDER_SITE_OTHER): Payer: 59 | Admitting: Endocrinology

## 2012-03-16 ENCOUNTER — Encounter: Payer: Self-pay | Admitting: Endocrinology

## 2012-03-16 VITALS — BP 124/80 | HR 100 | Wt 243.0 lb

## 2012-03-16 DIAGNOSIS — E109 Type 1 diabetes mellitus without complications: Secondary | ICD-10-CM

## 2012-03-16 LAB — HEMOGLOBIN A1C: Hgb A1c MFr Bld: 7.5 % — ABNORMAL HIGH (ref 4.6–6.5)

## 2012-03-16 MED ORDER — CEFUROXIME AXETIL 250 MG PO TABS: 250.0000 mg | ORAL_TABLET | Freq: Two times a day (BID) | ORAL | Status: AC

## 2012-03-16 NOTE — Patient Instructions (Addendum)
continue lantus, 180 units daily continue novolog, 90 units 3 times a day (just before each meal).  However, if you are going to be active, take just 50 units.   check your blood sugar twice a day.  vary the time of day when you check, between before the 3 meals, and at bedtime.  also check if you have symptoms of your blood sugar being too high or too low.  please keep a record of the readings and bring it to your next appointment here.  please call us sooner if your blood sugar goes below 70, or if you have a lot of readings over 200. Please come back for a follow-up appointment in 3 months.   blood tests are being requested for you today.  We'll contact you with results.  i have sent a prescription to your pharmacy, for an antibiotic pill.

## 2012-03-16 NOTE — Progress Notes (Signed)
Subjective:    Patient ID: Catherine Moon, female    DOB: 02/23/1960, 52 y.o.   MRN: [id number]  HPI Pt returns for f/u of IDDM (dx'ed 1997; no known complications; she has never had severe hypoglycemia or DKA).  no cbg record, but states cbg's are sometimes low in the early hrs of the am.  It is otherwise well-controlled.  Pt states few days of slight pain at the throat.  No assoc fever Past Medical History  Diagnosis Date  . DM (diabetes mellitus)   . HTN (hypertension)   . Dyslipidemia   . Warts, genital   . Smoker   . CTS (carpal tunnel syndrome)   . Hypercholesterolemia     Past Surgical History  Procedure Laterality Date  . Dilation and curettage of uterus      History   Social History  . Marital Status: Married    Spouse Name: N/A    Number of Children: N/A  . Years of Education: N/A   Occupational History  . Not on file.   Social History Main Topics  . Smoking status: Former Smoker -- 1.00 packs/day for 26 years    Types: Cigarettes    Quit date: 08/13/2007  . Smokeless tobacco: Never Used     Comment:  She smoked one pack per day for 26 years but quit in 2009.  Marland Kitchen Alcohol Use: Not on file  . Drug Use: Not on file  . Sexually Active: Not on file   Other Topics Concern  . Not on file   Social History Narrative   Works 3rd shift in [employer], married.  She smoked one pack per day for 26 years but quit in 2009.          Current Outpatient Prescriptions on File Prior to Visit  Medication Sig Dispense Refill  . aspirin 81 MG EC tablet Take 81 mg by mouth daily.        Marland Kitchen atorvastatin (LIPITOR) 40 MG tablet Take 1 tablet (40 mg total) by mouth daily.  90 tablet  3  . azithromycin (ZITHROMAX) 250 MG tablet Two tablets daily x 5 days  10 tablet  0  . Dextromethorphan-Guaifenesin (MUCINEX DM) 30-600 MG TB12 Take 1 tablet by mouth 2 (two) times daily as needed.  28 each  0  . diltiazem (DILACOR XR) 120 MG 24 hr capsule Take 1 capsule (120 mg  total) by mouth daily.  90 capsule  3  . glucose blood (ONE TOUCH ULTRA TEST) test strip 1 each by Other route 3 (three) times daily. And lancets 3/day 250.01  300 each  3  . insulin glargine (LANTUS) 100 UNIT/ML injection INJECT 180 UNITS SUBQ EVERY DAY      . insulin lispro (HUMALOG KWIKPEN) 100 UNIT/ML injection Inject 90 Units into the skin 3 (three) times daily before meals. And pen needles 4/day  270 mL  0  . Insulin Pen Needle (BD ULTRA-FINE PEN NEEDLES) 29G X 12.7MM MISC USE 5 DAILY AS DIRECTED  500 each  3  . ipratropium (ATROVENT) 0.03 % nasal spray Place 2 sprays into the nose 2 (two) times daily.  30 mL  5  . lisinopril-hydrochlorothiazide (PRINZIDE,ZESTORETIC) 20-12.5 MG per tablet Take 2 tablets by mouth daily.  180 tablet  3   No current facility-administered medications on file prior to visit.    No Known Allergies  Family History  Problem Relation Age of Onset  . Breast cancer Mother   . Heart disease Mother   .  Cancer Mother     Breast  . Coronary artery disease Father     starting in his 21s. A bypass and mitral infarction  . Heart disease Father     Coronary Disease, bypass and mitral infarction   BP 124/80  Pulse 100  Wt 243 lb (110.224 kg)  BMI 40.14 kg/m2  SpO2 98%  Review of Systems Denies LOC and earache    Objective:   Physical Exam VITAL SIGNS:  See vs page GENERAL: no distress head: no deformity eyes: no periorbital swelling, no proptosis external nose and ears are normal mouth: no lesion seen Left tm is red--right is normal Pulses: dorsalis pedis intact bilat.   Feet: no deformity.  no ulcer on the feet.  feet are of normal color and temp.  Trace bilat leg edema Neuro: sensation is intact to touch on the feet.      Assessment & Plan:  URI, new DM, with apparently improved control

## 2012-03-16 NOTE — Telephone Encounter (Signed)
Pt came in for ov today and stated she is using [facility]

## 2012-07-14 ENCOUNTER — Other Ambulatory Visit: Payer: Self-pay | Admitting: *Deleted

## 2012-07-14 MED ORDER — INSULIN LISPRO 100 UNIT/ML ~~LOC~~ SOLN: 90.0000 [IU] | Freq: Three times a day (TID) | SUBCUTANEOUS | Status: DC

## 2012-07-20 ENCOUNTER — Other Ambulatory Visit: Payer: Self-pay | Admitting: *Deleted

## 2012-07-20 ENCOUNTER — Other Ambulatory Visit: Payer: Self-pay | Admitting: Endocrinology

## 2012-07-20 MED ORDER — INSULIN GLARGINE 100 UNIT/ML ~~LOC~~ SOLN: SUBCUTANEOUS | Status: DC

## 2012-07-20 MED ORDER — INSULIN PEN NEEDLE 29G X 12.7MM MISC: Status: DC

## 2012-07-20 NOTE — Telephone Encounter (Signed)
rx sent

## 2012-08-13 ENCOUNTER — Other Ambulatory Visit: Payer: Self-pay | Admitting: Endocrinology

## 2012-08-14 ENCOUNTER — Other Ambulatory Visit: Payer: Self-pay | Admitting: *Deleted

## 2012-08-14 MED ORDER — LISINOPRIL-HYDROCHLOROTHIAZIDE 20-12.5 MG PO TABS: 2.0000 | ORAL_TABLET | Freq: Every day | ORAL | Status: DC

## 2012-09-28 ENCOUNTER — Encounter: Payer: Self-pay | Admitting: Cardiology

## 2012-11-09 ENCOUNTER — Other Ambulatory Visit: Payer: Self-pay

## 2012-12-12 ENCOUNTER — Other Ambulatory Visit: Payer: Self-pay | Admitting: Endocrinology

## 2013-01-25 ENCOUNTER — Other Ambulatory Visit: Payer: Self-pay | Admitting: Endocrinology

## 2013-01-26 ENCOUNTER — Other Ambulatory Visit: Payer: Self-pay

## 2013-03-14 ENCOUNTER — Encounter: Payer: Self-pay | Admitting: Endocrinology

## 2013-03-14 ENCOUNTER — Other Ambulatory Visit: Payer: Self-pay

## 2013-03-14 ENCOUNTER — Ambulatory Visit (INDEPENDENT_AMBULATORY_CARE_PROVIDER_SITE_OTHER): Payer: 59 | Admitting: Endocrinology

## 2013-03-14 VITALS — BP 116/72 | HR 95 | Temp 98.6°F | Ht 65.25 in | Wt 244.0 lb

## 2013-03-14 DIAGNOSIS — Z0000 Encounter for general adult medical examination without abnormal findings: Secondary | ICD-10-CM

## 2013-03-14 DIAGNOSIS — E7800 Pure hypercholesterolemia, unspecified: Secondary | ICD-10-CM

## 2013-03-14 DIAGNOSIS — E109 Type 1 diabetes mellitus without complications: Secondary | ICD-10-CM

## 2013-03-14 DIAGNOSIS — I10 Essential (primary) hypertension: Secondary | ICD-10-CM

## 2013-03-14 LAB — URINALYSIS, ROUTINE W REFLEX MICROSCOPIC
BILIRUBIN URINE: NEGATIVE
Hgb urine dipstick: NEGATIVE
KETONES UR: NEGATIVE
LEUKOCYTES UA: NEGATIVE
Nitrite: NEGATIVE
PH: 6 (ref 5.0–8.0)
RBC / HPF: NONE SEEN (ref 0–?)
Specific Gravity, Urine: >=1.03 — AB (ref 1.000–1.030)
Total Protein, Urine: NEGATIVE
URINE GLUCOSE: 100 — AB
UROBILINOGEN UA: 0.2 (ref 0.0–1.0)

## 2013-03-14 LAB — LIPID PANEL
Cholesterol: 188 mg/dL (ref 0–200)
HDL: 41 mg/dL (ref >39.00)
LDL Cholesterol: 106 mg/dL — ABNORMAL HIGH (ref 0–99)
TRIGLYCERIDES: 206 mg/dL — AB (ref 0.0–149.0)
Total CHOL/HDL Ratio: 5
VLDL: 41.2 mg/dL — AB (ref 0.0–40.0)

## 2013-03-14 LAB — CBC WITH DIFFERENTIAL/PLATELET
BASOS ABS: 0 10*3/uL (ref 0.0–0.1)
Basophils Relative: 0.2 % (ref 0.0–3.0)
EOS ABS: 0.2 10*3/uL (ref 0.0–0.7)
Eosinophils Relative: 1.5 % (ref 0.0–5.0)
HCT: 42.8 % (ref 36.0–46.0)
HEMOGLOBIN: 14.4 g/dL (ref 12.0–15.0)
LYMPHS PCT: 32.5 % (ref 12.0–46.0)
Lymphs Abs: 3.5 10*3/uL (ref 0.7–4.0)
MCHC: 33.5 g/dL (ref 30.0–36.0)
MCV: 91 fl (ref 78.0–100.0)
MONOS PCT: 7.9 % (ref 3.0–12.0)
Monocytes Absolute: 0.9 10*3/uL (ref 0.1–1.0)
NEUTROS ABS: 6.3 10*3/uL (ref 1.4–7.7)
NEUTROS PCT: 57.9 % (ref 43.0–77.0)
PLATELETS: 400 10*3/uL (ref 150.0–400.0)
RBC: 4.71 Mil/uL (ref 3.87–5.11)
RDW: 13.5 % (ref 11.5–14.6)
WBC: 10.9 10*3/uL — ABNORMAL HIGH (ref 4.5–10.5)

## 2013-03-14 LAB — MICROALBUMIN / CREATININE URINE RATIO
Creatinine,U: 137.6 mg/dL
MICROALB UR: 1 mg/dL (ref 0.0–1.9)
Microalb Creat Ratio: 0.7 mg/g (ref 0.0–30.0)

## 2013-03-14 LAB — HEMOGLOBIN A1C: HEMOGLOBIN A1C: 7.9 % — AB (ref 4.6–6.5)

## 2013-03-14 LAB — HEPATIC FUNCTION PANEL
ALBUMIN: 4.2 g/dL (ref 3.5–5.2)
ALT: 22 U/L (ref 0–35)
AST: 18 U/L (ref 0–37)
Alkaline Phosphatase: 78 U/L (ref 39–117)
BILIRUBIN TOTAL: 0.5 mg/dL (ref 0.3–1.2)
Bilirubin, Direct: 0 mg/dL (ref 0.0–0.3)
Total Protein: 7.5 g/dL (ref 6.0–8.3)

## 2013-03-14 LAB — BASIC METABOLIC PANEL
BUN: 13 mg/dL (ref 6–23)
CALCIUM: 9.7 mg/dL (ref 8.4–10.5)
CO2: 27 mEq/L (ref 19–32)
Chloride: 103 mEq/L (ref 96–112)
Creatinine, Ser: 0.7 mg/dL (ref 0.4–1.2)
GFR: 88.93 mL/min (ref >60.00)
Glucose, Bld: 184 mg/dL — ABNORMAL HIGH (ref 70–99)
Potassium: 3.7 mEq/L (ref 3.5–5.1)
SODIUM: 137 meq/L (ref 135–145)

## 2013-03-14 LAB — TSH: TSH: 1 u[IU]/mL (ref 0.35–5.50)

## 2013-03-14 MED ORDER — INSULIN LISPRO 100 UNIT/ML (KWIKPEN): 90.0000 [IU] | PEN_INJECTOR | Freq: Three times a day (TID) | SUBCUTANEOUS | Status: DC

## 2013-03-14 MED ORDER — ATORVASTATIN CALCIUM 40 MG PO TABS: 40.0000 mg | ORAL_TABLET | Freq: Every day | ORAL | Status: DC

## 2013-03-14 MED ORDER — DILTIAZEM HCL ER 120 MG PO CP24: 120.0000 mg | ORAL_CAPSULE | Freq: Every day | ORAL | Status: DC

## 2013-03-14 NOTE — Progress Notes (Signed)
Subjective:    Patient ID: Catherine Moon, female    DOB: Nov 09, 1960, 53 y.o.   MRN: [id number]  HPI Pt returns for f/u of IDDM (dx'ed 1997; she has been on insulin since 2010; she has mild if any neuropathy of the lower extremities; no known associated chronic complications; she has never had severe hypoglycemia or DKA; she takes multiple daily injections).  no cbg record, but states cbg's are sometimes mildly low in the early hrs of the am.  It is otherwise well-controlled.  Past Medical History  Diagnosis Date  . DM (diabetes mellitus)   . HTN (hypertension)   . Dyslipidemia   . Warts, genital   . Smoker   . CTS (carpal tunnel syndrome)   . Hypercholesterolemia     Past Surgical History  Procedure Laterality Date  . Dilation and curettage of uterus      History   Social History  . Marital Status: Married    Spouse Name: N/A    Number of Children: N/A  . Years of Education: N/A   Occupational History  . Not on file.   Social History Main Topics  . Smoking status: Former Smoker -- 1.00 packs/day for 26 years    Types: Cigarettes    Quit date: 08/13/2007  . Smokeless tobacco: Never Used     Comment:  She smoked one pack per day for 26 years but quit in 2009.  Marland Kitchen Alcohol Use: Not on file  . Drug Use: Not on file  . Sexual Activity: Not on file   Other Topics Concern  . Not on file   Social History Narrative   Works 3rd shift in [employer], married.  She smoked one pack per day for 26 years but quit in 2009.          Current Outpatient Prescriptions on File Prior to Visit  Medication Sig Dispense Refill  . aspirin 81 MG EC tablet Take 81 mg by mouth daily.        Marland Kitchen Dextromethorphan-Guaifenesin (MUCINEX DM) 30-600 MG TB12 Take 1 tablet by mouth 2 (two) times daily as needed.  28 each  0  . glucose blood (ONE TOUCH ULTRA TEST) test strip 1 each by Other route 3 (three) times daily. And lancets 3/day 250.01  300 each  3  . Insulin Pen Needle (BD  ULTRA-FINE PEN NEEDLES) 29G X 12.7MM MISC USE 5 DAILY AS DIRECTED  500 each  3  . ipratropium (ATROVENT) 0.03 % nasal spray Place 2 sprays into the nose 2 (two) times daily.  30 mL  5  . LANTUS SOLOSTAR 100 UNIT/ML Solostar Pen INJECT 180 UNITS UNDER THE SKIN DAILY  11 pen  5  . lisinopril-hydrochlorothiazide (PRINZIDE,ZESTORETIC) 20-12.5 MG per tablet Take 2 tablets by mouth daily.  180 tablet  3   No current facility-administered medications on file prior to visit.    No Known Allergies  Family History  Problem Relation Age of Onset  . Breast cancer Mother   . Heart disease Mother   . Cancer Mother     Breast  . Coronary artery disease Father     starting in his 87s. A bypass and mitral infarction  . Heart disease Father     Coronary Disease, bypass and mitral infarction   BP 116/72  Pulse 95  Temp(Src) 98.6 F (37 C) (Oral)  Ht 5' 5.25" (1.657 m)  Wt 244 lb (110.678 kg)  BMI 40.31 kg/m2  SpO2 96%  Review  of Systems She denies hypoglycemia and weight change.    Objective:   Physical Exam VITAL SIGNS:  See vs page GENERAL: no distress  Lab Results  Component Value Date   HGBA1C 7.9* 03/14/2013      Assessment & Plan:  DM: Based on the pattern of her cbg's, she needs some adjustment in her therapy. Morbid obesity: this complicates the rx of DM.

## 2013-03-14 NOTE — Patient Instructions (Addendum)
Please come back soon for a regular physical appointment.   check your blood sugar twice a day.  vary the time of day when you check, between before the 3 meals, and at bedtime.  also check if you have symptoms of your blood sugar being too high or too low.  please keep a record of the readings and bring it to your next appointment here.  You can write it on any piece of paper.  please call us sooner if your blood sugar goes below 70, or if you have a lot of readings over 200. blood tests are being requested for you today.  We'll contact you with results.

## 2013-03-15 MED ORDER — ATORVASTATIN CALCIUM 80 MG PO TABS: 80.0000 mg | ORAL_TABLET | Freq: Every day | ORAL | Status: DC

## 2013-05-09 ENCOUNTER — Ambulatory Visit: Payer: 59 | Admitting: Emergency Medicine

## 2013-05-09 ENCOUNTER — Ambulatory Visit: Payer: 59

## 2013-05-09 VITALS — BP 142/74 | HR 105 | Temp 98.2°F | Resp 18 | Ht 65.25 in | Wt 242.0 lb

## 2013-05-09 DIAGNOSIS — S61409A Unspecified open wound of unspecified hand, initial encounter: Secondary | ICD-10-CM

## 2013-05-09 DIAGNOSIS — S61419A Laceration without foreign body of unspecified hand, initial encounter: Secondary | ICD-10-CM

## 2013-05-09 DIAGNOSIS — M79609 Pain in unspecified limb: Secondary | ICD-10-CM

## 2013-05-09 DIAGNOSIS — Z23 Encounter for immunization: Secondary | ICD-10-CM

## 2013-05-09 MED ORDER — AMOXICILLIN-POT CLAVULANATE 875-125 MG PO TABS: 1.0000 | ORAL_TABLET | Freq: Two times a day (BID) | ORAL | Status: DC

## 2013-05-09 NOTE — Patient Instructions (Signed)

## 2013-05-09 NOTE — Progress Notes (Signed)
[facility] [address], [place] [address] [phone]  Date:  05/09/2013   Name:  Catherine Moon   DOB:  December 05, 1960   MRN:  [id number]  PCP:  Renato Shin, MD    Chief Complaint: Animal Bite   History of Present Illness:  Catherine Moon is a 53 y.o. very pleasant female patient who presents with the following:  Bitten by her house cat that was in a fight with another outside cat last night.  She has a puncture of the tip of the right thumb through the nail and multiple other punctures and scratches on both hands.  Not current on TD.  Cat is UTD on rabies.  No improvement with over the counter medications or other home remedies. Denies other complaint or health concern today.   Patient Active Problem List   Diagnosis Date Noted  . Routine general medical examination at a health care facility 03/14/2013  . Pain of left heel 11/23/2011  . Menopausal state 09/23/2011  . CHEST PAIN 12/18/2009  . TOBACCO USE, QUIT 12/18/2009  . HYPERCHOLESTEROLEMIA 06/25/2008  . ALLERGIC RHINITIS 04/04/2007  . DIABETES MELLITUS, TYPE I 08/20/2006  . HYPERTENSION 08/20/2006    Past Medical History  Diagnosis Date  . DM (diabetes mellitus)   . HTN (hypertension)   . Dyslipidemia   . Warts, genital   . Smoker   . CTS (carpal tunnel syndrome)   . Hypercholesterolemia     Past Surgical History  Procedure Laterality Date  . Dilation and curettage of uterus      History  Substance Use Topics  . Smoking status: Former Smoker -- 1.00 packs/day for 26 years    Types: Cigarettes    Quit date: 08/13/2007  . Smokeless tobacco: Never Used     Comment:  She smoked one pack per day for 26 years but quit in 2009.  Marland Kitchen Alcohol Use: Not on file    Family History  Problem Relation Age of Onset  . Breast cancer Mother   . Heart disease Mother   . Cancer Mother     Breast  . Coronary artery disease Father     starting in his 86s. A bypass and mitral infarction  .  Heart disease Father     Coronary Disease, bypass and mitral infarction    No Known Allergies  Medication list has been reviewed and updated.  Current Outpatient Prescriptions on File Prior to Visit  Medication Sig Dispense Refill  . aspirin 81 MG EC tablet Take 81 mg by mouth daily.        Marland Kitchen atorvastatin (LIPITOR) 80 MG tablet Take 1 tablet (80 mg total) by mouth daily.  90 tablet  3  . diltiazem (DILT-XR) 120 MG 24 hr capsule Take 1 capsule (120 mg total) by mouth daily.  90 capsule  0  . glucose blood (ONE TOUCH ULTRA TEST) test strip 1 each by Other route 3 (three) times daily. And lancets 3/day 250.01  300 each  3  . insulin glargine (LANTUS) 100 UNIT/ML injection Inject 160 Units into the skin daily.      . insulin lispro (HUMALOG) 100 UNIT/ML KiwkPen Inject 110 Units into the skin 3 (three) times daily.      . Insulin Pen Needle (BD ULTRA-FINE PEN NEEDLES) 29G X 12.7MM MISC USE 5 DAILY AS DIRECTED  500 each  3  . LANTUS SOLOSTAR 100 UNIT/ML Solostar Pen INJECT 180 UNITS UNDER THE SKIN DAILY  11 pen  5  . lisinopril-hydrochlorothiazide (PRINZIDE,ZESTORETIC) 20-12.5 MG per tablet Take 2 tablets by mouth daily.  180 tablet  3  . Dextromethorphan-Guaifenesin (MUCINEX DM) 30-600 MG TB12 Take 1 tablet by mouth 2 (two) times daily as needed.  28 each  0  . ipratropium (ATROVENT) 0.03 % nasal spray Place 2 sprays into the nose 2 (two) times daily.  30 mL  5   No current facility-administered medications on file prior to visit.    Review of Systems:  As per HPI, otherwise negative.    Physical Examination: Filed Vitals:   05/09/13 0856  BP: 142/74  Pulse: 105  Temp: 98.2 F (36.8 C)  Resp: 18   Filed Vitals:   05/09/13 0856  Height: 5' 5.25" (1.657 m)  Weight: 242 lb (109.77 kg)   Body mass index is 39.98 kg/(m^2). Ideal Body Weight: Weight in (lb) to have BMI = 25: 151.1   GEN: WDWN, NAD, Non-toxic, Alert & Oriented x 3 HEENT: Atraumatic, Normocephalic.  Ears and  Nose: No external deformity. EXTR: No clubbing/cyanosis/edema NEURO: Normal gait.  PSYCH: Normally interactive. Conversant. Not depressed or anxious appearing.  Calm demeanor.  Hands:  Numerous superficial lacerations and scratches on her hands and fingers.  Puncture through nail of right thumb.    Assessment and Plan: Cat bite TD augmentin  Signed,  Ellison Carwin, MD   [facility] reading (PRIMARY) by  Dr. Ouida Sills.  negative.

## 2013-07-26 ENCOUNTER — Telehealth: Payer: Self-pay

## 2013-07-26 NOTE — Telephone Encounter (Signed)
LVM for pt to call back and schedule CPE with PCP.   York Spaniel Bundle pt./

## 2013-09-11 ENCOUNTER — Other Ambulatory Visit: Payer: Self-pay | Admitting: Endocrinology

## 2013-09-25 ENCOUNTER — Encounter: Payer: Self-pay | Admitting: Endocrinology

## 2013-09-25 ENCOUNTER — Ambulatory Visit (INDEPENDENT_AMBULATORY_CARE_PROVIDER_SITE_OTHER): Payer: 59 | Admitting: Endocrinology

## 2013-09-25 ENCOUNTER — Other Ambulatory Visit: Payer: Self-pay

## 2013-09-25 VITALS — BP 122/64 | HR 94 | Temp 98.7°F | Ht 65.25 in | Wt 242.0 lb

## 2013-09-25 DIAGNOSIS — E109 Type 1 diabetes mellitus without complications: Secondary | ICD-10-CM

## 2013-09-25 DIAGNOSIS — N951 Menopausal and female climacteric states: Secondary | ICD-10-CM

## 2013-09-25 DIAGNOSIS — Z23 Encounter for immunization: Secondary | ICD-10-CM

## 2013-09-25 DIAGNOSIS — Z0000 Encounter for general adult medical examination without abnormal findings: Secondary | ICD-10-CM

## 2013-09-25 LAB — HEMOGLOBIN A1C: HEMOGLOBIN A1C: 7.8 % — AB (ref 4.6–6.5)

## 2013-09-25 LAB — LIPID PANEL
CHOL/HDL RATIO: 4
Cholesterol: 162 mg/dL (ref 0–200)
HDL: 38.2 mg/dL — ABNORMAL LOW (ref >39.00)
LDL CALC: 100 mg/dL — AB (ref 0–99)
NonHDL: 123.8
Triglycerides: 121 mg/dL (ref 0.0–149.0)
VLDL: 24.2 mg/dL (ref 0.0–40.0)

## 2013-09-25 MED ORDER — INSULIN LISPRO 100 UNIT/ML (KWIKPEN): 110.0000 [IU] | PEN_INJECTOR | Freq: Three times a day (TID) | SUBCUTANEOUS | Status: DC

## 2013-09-25 MED ORDER — LISINOPRIL-HYDROCHLOROTHIAZIDE 20-12.5 MG PO TABS: 2.0000 | ORAL_TABLET | Freq: Every day | ORAL | Status: DC

## 2013-09-25 MED ORDER — DILTIAZEM HCL ER 120 MG PO CP24: 120.0000 mg | ORAL_CAPSULE | Freq: Every day | ORAL | Status: DC

## 2013-09-25 NOTE — Progress Notes (Signed)
Subjective:    Patient ID: Catherine Moon, female    DOB: 1960-04-09, 53 y.o.   MRN: [id number]  HPI  Wellness visit today, with problems stable, except as noted. Past Medical History  Diagnosis Date  . DM (diabetes mellitus)   . HTN (hypertension)   . Dyslipidemia   . Warts, genital   . Smoker   . CTS (carpal tunnel syndrome)   . Hypercholesterolemia     Past Surgical History  Procedure Laterality Date  . Dilation and curettage of uterus      History   Social History  . Marital Status: Married    Spouse Name: N/A    Number of Children: N/A  . Years of Education: N/A   Occupational History  . Not on file.   Social History Main Topics  . Smoking status: Former Smoker -- 1.00 packs/day for 26 years    Types: Cigarettes    Quit date: 08/13/2007  . Smokeless tobacco: Never Used     Comment:  She smoked one pack per day for 26 years but quit in 2009.  Marland Kitchen Alcohol Use: Not on file  . Drug Use: Not on file  . Sexual Activity: Not on file   Other Topics Concern  . Not on file   Social History Narrative   Works 3rd shift in [employer], married.  She smoked one pack per day for 26 years but quit in 2009.          Current Outpatient Prescriptions on File Prior to Visit  Medication Sig Dispense Refill  . aspirin 81 MG EC tablet Take 81 mg by mouth daily.        Marland Kitchen atorvastatin (LIPITOR) 80 MG tablet Take 1 tablet (80 mg total) by mouth daily.  90 tablet  3  . glucose blood (ONE TOUCH ULTRA TEST) test strip 1 each by Other route 3 (three) times daily. And lancets 3/day 250.01  300 each  3  . insulin glargine (LANTUS) 100 UNIT/ML injection Inject 160 Units into the skin daily.      . Insulin Pen Needle (BD ULTRA-FINE PEN NEEDLES) 29G X 12.7MM MISC USE 5 DAILY AS DIRECTED  500 each  3  . ipratropium (ATROVENT) 0.03 % nasal spray Place 2 sprays into the nose 2 (two) times daily.  30 mL  5  . LANTUS SOLOSTAR 100 UNIT/ML Solostar Pen INJECT 180 UNITS UNDER THE  SKIN DAILY  11 pen  5   No current facility-administered medications on file prior to visit.    No Known Allergies  Family History  Problem Relation Age of Onset  . Breast cancer Mother   . Heart disease Mother   . Cancer Mother     Breast  . Coronary artery disease Father     starting in his 88s. A bypass and mitral infarction  . Heart disease Father     Coronary Disease, bypass and mitral infarction    BP 122/64  Pulse 94  Temp(Src) 98.7 F (37.1 C) (Oral)  Ht 5' 5.25" (1.657 m)  Wt 242 lb (109.77 kg)  BMI 39.98 kg/m2  SpO2 95%  Review of Systems  Constitutional: Negative for fever and unexpected weight change.  HENT: Negative for hearing loss.   Eyes: Negative for visual disturbance.  Respiratory: Negative for shortness of breath.   Cardiovascular: Negative for chest pain.  Gastrointestinal: Negative for anal bleeding.  Endocrine: Negative for cold intolerance.  Genitourinary: Negative for hematuria.  Musculoskeletal: Negative for  back pain.  Allergic/Immunologic: Positive for environmental allergies.  Neurological: Negative for syncope and numbness.  Hematological: Does not bruise/bleed easily.  Psychiatric/Behavioral: Negative for dysphoric mood.      Objective:   Physical Exam VS: see vs page GEN: no distress HEAD: head: no deformity eyes: no periorbital swelling, no proptosis external nose and ears are normal. Severe terminal hair growth of the face mouth: no lesion seen NECK: supple, thyroid is not enlarged CHEST WALL: no deformity LUNGS:  Clear to auscultation BREASTS: sees gyn. CV: reg rate and rhythm, no murmur ABD: abdomen is soft, nontender.  no hepatosplenomegaly.  not distended.  no hernia GENITALIA/RECTAL: sees gyn.   MUSCULOSKELETAL: muscle bulk and strength are grossly normal.  no obvious joint swelling.  gait is normal and steady EXTEMITIES: no deformity.  no ulcer on the feet.  feet are of normal color and temp.  no edema PULSES:  dorsalis pedis intact bilat.  no carotid bruit NEURO:  cn 2-12 grossly intact.   readily moves all 4's.  sensation is intact to touch on the feet SKIN:  Normal texture and temperature.  No rash or suspicious lesion is visible.   NODES:  None palpable at the neck PSYCH: alert, well-oriented.  Does not appear anxious nor depressed.     Assessment & Plan:  Wellness visit today, with problems stable, except as noted. we discussed code status.  pt requests full code, but would not want to be started or maintained on artificial life-support measures if there was not a reasonable chance of recovery. please consider these measures for your health:  minimize alcohol.  do not use tobacco products.  have a colonoscopy at least every 10 years from age 70.  Women should have an annual mammogram from age 20.  keep firearms safely stored.  always use seat belts.  have working smoke alarms in your home.  see an eye doctor and dentist regularly.  never drive under the influence of alcohol or drugs (including prescription drugs).  those with fair skin should take precautions against the sun.    SEPARATE EVALUATION FOLLOWS--EACH PROBLEM HERE IS NEW, NOT RESPONDING TO TREATMENT, OR POSES SIGNIFICANT RISK TO THE PATIENT'S HEALTH: HISTORY OF THE PRESENT ILLNESS:  Pt returns for f/u of diabetes mellitus:  DM type: insulin-requiring type 2 Dx'ed: 3888 Complications: none Therapy: insulin since 2010 GDM: never DKA: never Severe hypoglycemia: never Pancreatitis: never Other info: she takes multiple daily injections.  She works 3rd shift  Interval history: There is no trend throughout the day.   PAST MEDICAL HISTORY reviewed and up to date today REVIEW OF SYSTEMS: She denies hypoglycemia.  PHYSICAL EXAMINATION: VITAL SIGNS:  See vs page GENERAL: no distress Pulses: dorsalis pedis intact bilat.   Feet: no deformity.  no edema. Skin:  no ulcer on the feet.  normal color and temp.  Neuro: sensation is  intact to touch on the feet.  LAB/XRAY RESULTS: Lab Results  Component Value Date   HGBA1C 7.8* 09/25/2013  i reviewed electrocardiogram. IMPRESSION: DM: mild exacerbation Morbid obesity, persistent PLAN:  Pt is advised to increase insulin Please see a weight-loss surgery specialist.  you will receive a phone call, about a day and time for an appointment.

## 2013-09-25 NOTE — Patient Instructions (Addendum)
please consider these measures for your health:  minimize alcohol.  do not use tobacco products.  have a colonoscopy at least every 10 years from age 52.  Women should have an annual mammogram from age 96.  keep firearms safely stored.  always use seat belts.  have working smoke alarms in your home.  see an eye doctor and dentist regularly.  never drive under the influence of alcohol or drugs (including prescription drugs).  those with fair skin should take precautions against the sun.  blood tests are being requested for you today.  We'll contact you with results.  Please see a weight-loss surgery specialist.  you will receive a phone call, about a day and time for an appointment.  Please come back for a follow-up appointment in January.

## 2013-09-26 ENCOUNTER — Other Ambulatory Visit: Payer: Self-pay | Admitting: Endocrinology

## 2013-10-05 ENCOUNTER — Telehealth: Payer: Self-pay | Admitting: Endocrinology

## 2013-10-05 ENCOUNTER — Other Ambulatory Visit: Payer: 59

## 2013-10-05 MED ORDER — INSULIN LISPRO 100 UNIT/ML (KWIKPEN): PEN_INJECTOR | SUBCUTANEOUS | Status: DC

## 2013-10-05 NOTE — Telephone Encounter (Signed)
The pt is requesting a 90 day supply for her humalog please

## 2013-10-05 NOTE — Telephone Encounter (Signed)
Rx sent to pharmacy per pt's request.  

## 2013-10-09 ENCOUNTER — Ambulatory Visit (INDEPENDENT_AMBULATORY_CARE_PROVIDER_SITE_OTHER)
Admission: RE | Admit: 2013-10-09 | Discharge: 2013-10-09 | Disposition: A | Discharge status: Home / Self Care | Payer: 59 | Source: Ambulatory Visit | Attending: Endocrinology | Admitting: Endocrinology

## 2013-10-09 DIAGNOSIS — N951 Menopausal and female climacteric states: Secondary | ICD-10-CM

## 2013-11-06 ENCOUNTER — Other Ambulatory Visit: Payer: Self-pay | Admitting: Endocrinology

## 2014-02-05 IMAGING — CR DG CHEST 2V
2 series · 2 of 2 positions shown · non-contrast
Comparison: 08/12/2006

CLINICAL DATA: Shortness of breath

CHEST - 2 VIEW

[PA]
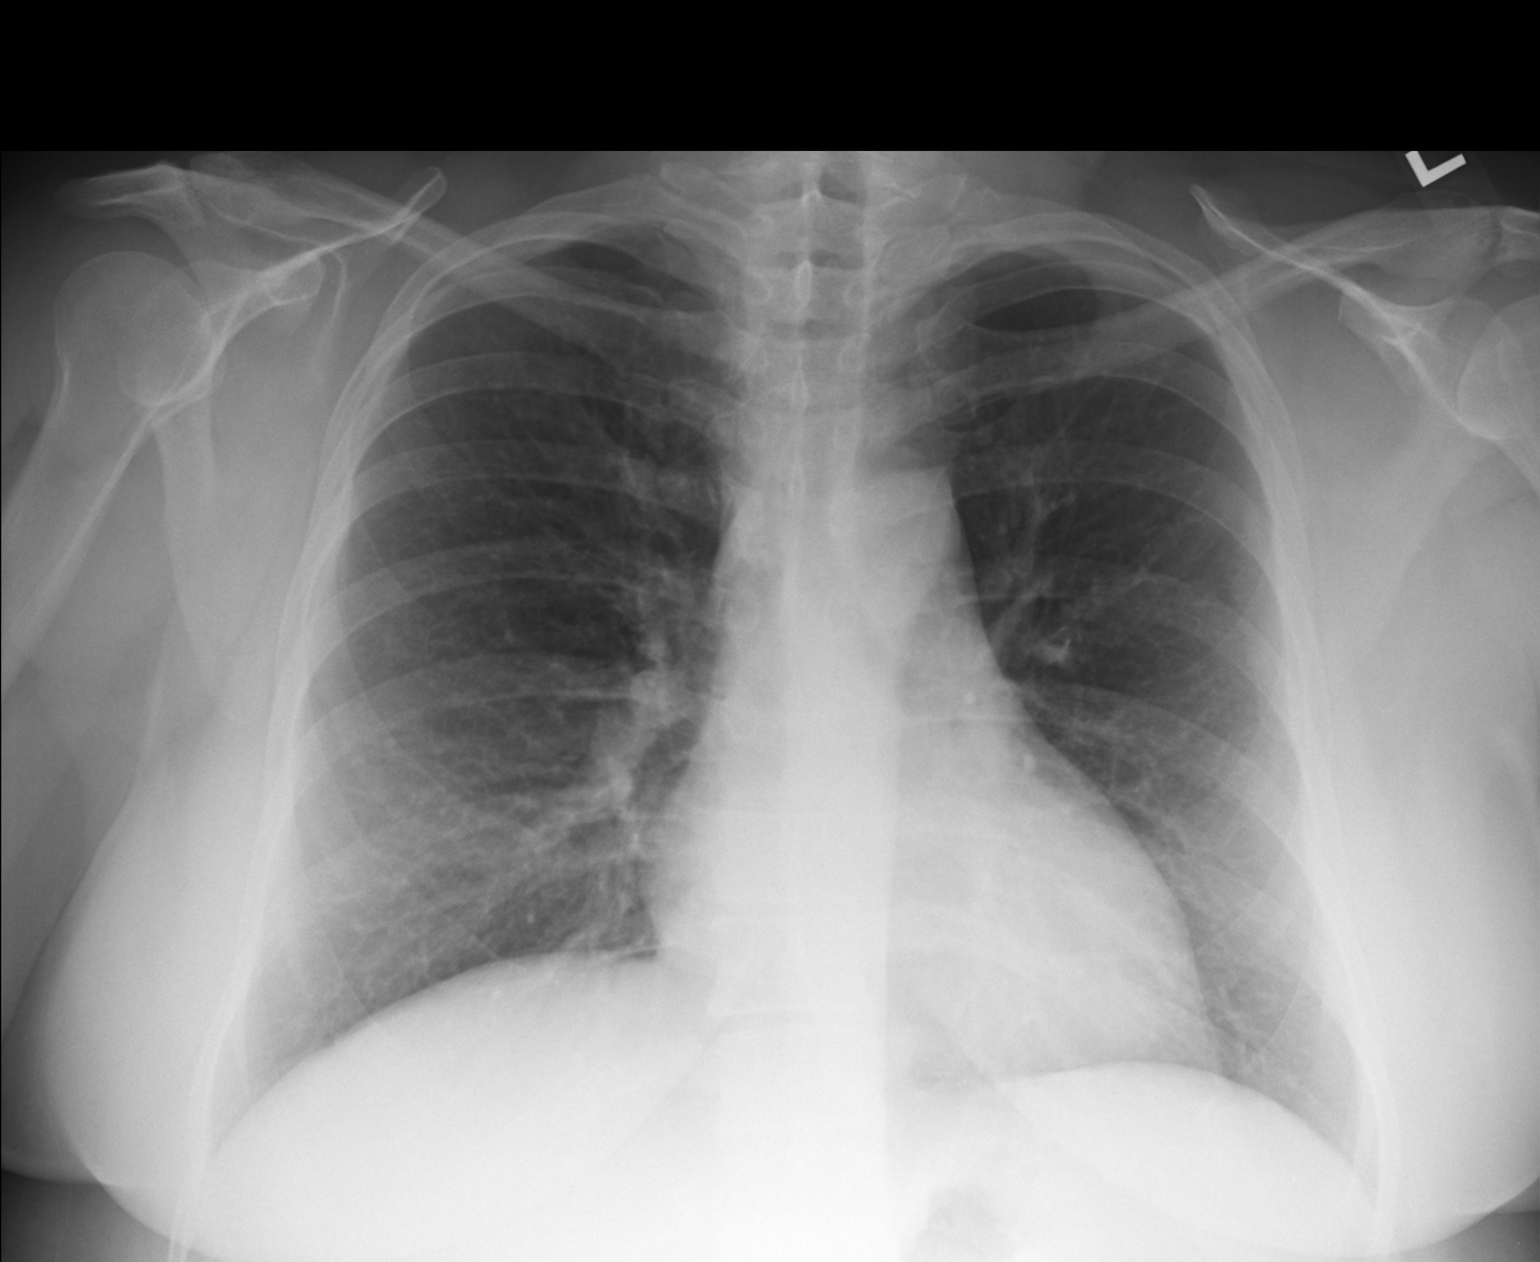

[lateral]
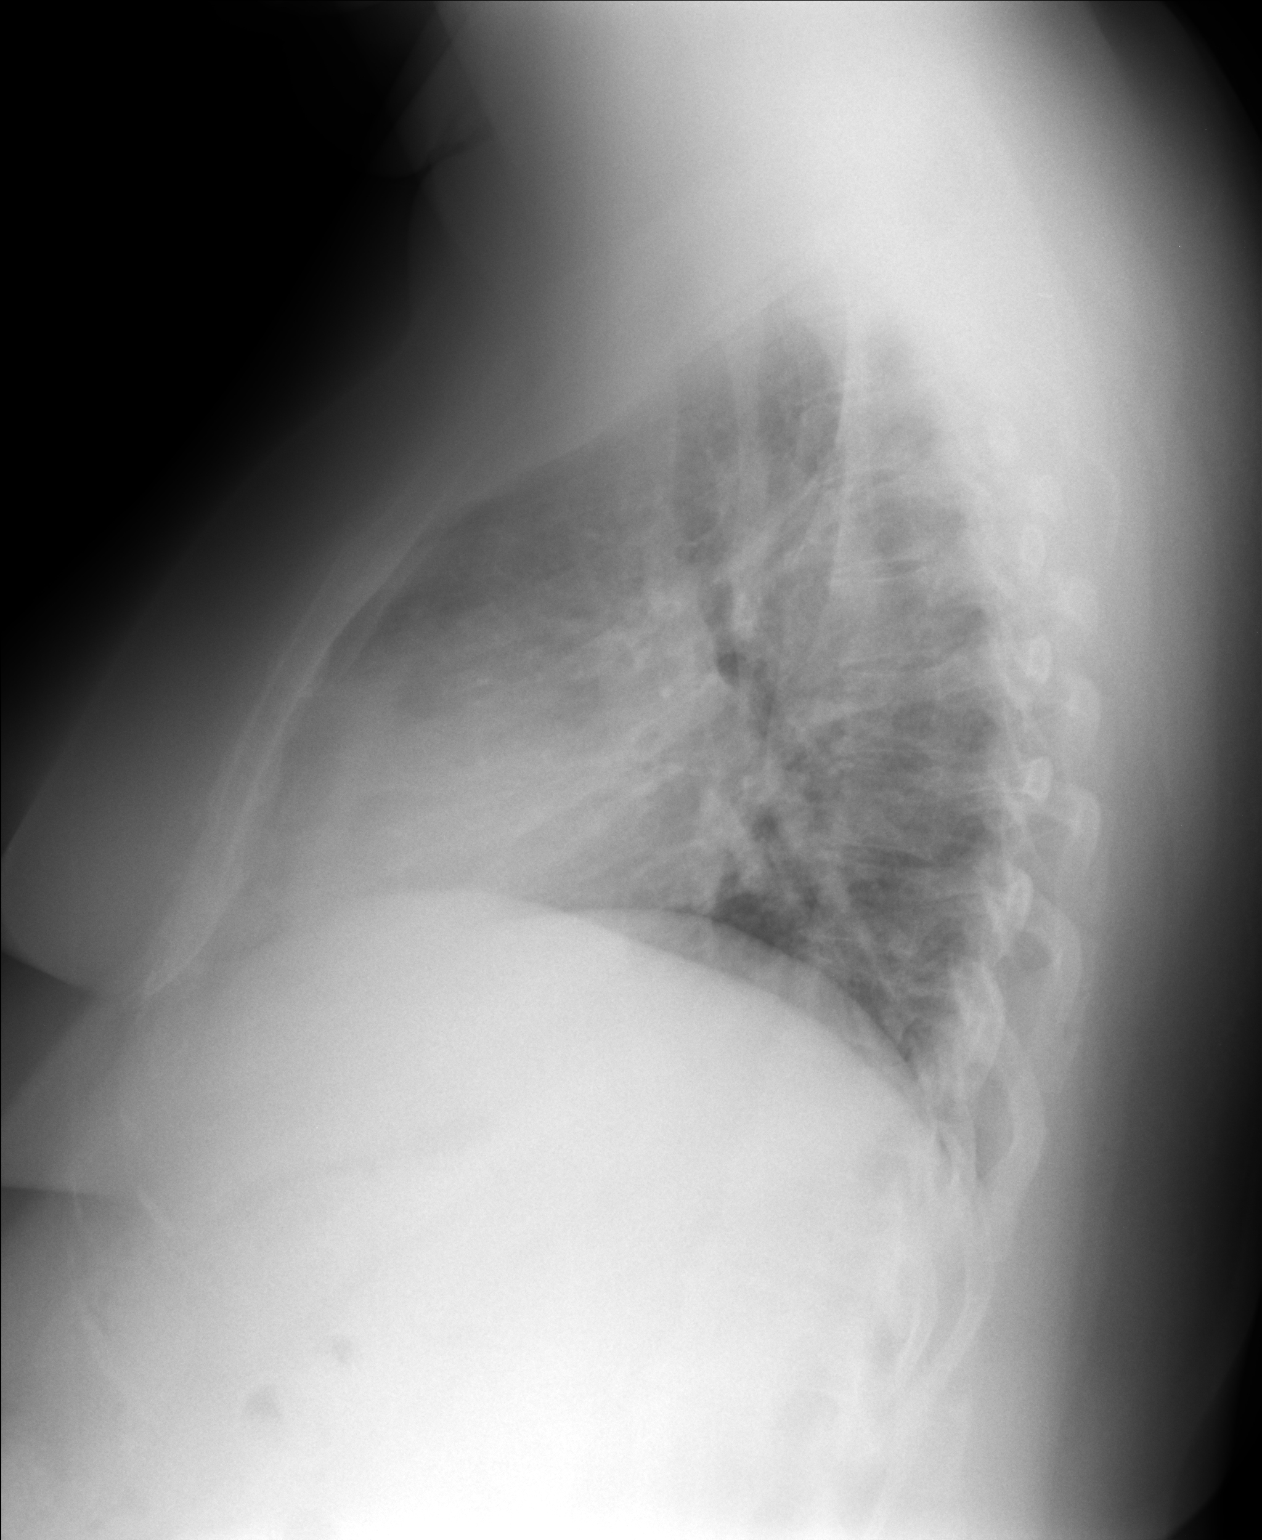

[2 of 2 positions shown; findings below may reference images not displayed]

FINDINGS: Lungs are clear. No pleural effusion or pneumothorax.

The heart is top normal in size.

Mild degenerative changes of the visualized thoracolumbar spine.
IMPRESSION: No evidence of acute cardiopulmonary disease.

## 2014-03-05 ENCOUNTER — Encounter: Payer: Self-pay | Admitting: Endocrinology

## 2014-03-12 ENCOUNTER — Ambulatory Visit
Admission: RE | Admit: 2014-03-12 | Discharge: 2014-03-12 | Disposition: A | Discharge status: Home / Self Care | Payer: Self-pay | Source: Ambulatory Visit | Attending: Endocrinology | Admitting: Endocrinology

## 2014-03-12 ENCOUNTER — Encounter: Payer: Self-pay | Admitting: Endocrinology

## 2014-03-12 ENCOUNTER — Ambulatory Visit (INDEPENDENT_AMBULATORY_CARE_PROVIDER_SITE_OTHER): Payer: 59 | Admitting: Endocrinology

## 2014-03-12 VITALS — BP 174/77 | HR 93 | Wt 245.6 lb

## 2014-03-12 DIAGNOSIS — M79641 Pain in right hand: Secondary | ICD-10-CM | POA: Insufficient documentation

## 2014-03-12 DIAGNOSIS — E109 Type 1 diabetes mellitus without complications: Secondary | ICD-10-CM

## 2014-03-12 LAB — SEDIMENTATION RATE: Sed Rate: 22 mm/hr (ref 0–22)

## 2014-03-12 LAB — HEMOGLOBIN A1C: Hgb A1c MFr Bld: 7.4 % — ABNORMAL HIGH (ref 4.6–6.5)

## 2014-03-12 LAB — RHEUMATOID FACTOR: Rhuematoid fact SerPl-aCnc: <10 IU/mL (ref <=14)

## 2014-03-12 NOTE — Progress Notes (Signed)
Subjective:    Patient ID: Catherine Moon, female    DOB: 1960/01/21, 54 y.o.   MRN: [id number]  HPI Pt returns for f/u of diabetes mellitus: DM type: Insulin-requiring type 2 Dx'ed: 3570 Complications: none Therapy: insulin since 2010 GDM: never  DKA: never Severe hypoglycemia: never Pancreatitis: never Other: DM is characterized by severe insulin resistance; she takes multiple daily injections; she works 3rd shift. Interval history: She has mild hypoglycemia approx once per week.  This happens when a meal is missed or delayed.  pt states she feels well in general.  no cbg record, but states cbg's are well-controlled.  There is no trend throughout the day.  Pt states few weeks of pain at the right ring finger MCP.  No assoc numbness.  No injury. Past Medical History  Diagnosis Date  . DM (diabetes mellitus)   . HTN (hypertension)   . Dyslipidemia   . Warts, genital   . Smoker   . CTS (carpal tunnel syndrome)   . Hypercholesterolemia     Past Surgical History  Procedure Laterality Date  . Dilation and curettage of uterus      History   Social History  . Marital Status: Married    Spouse Name: N/A  . Number of Children: N/A  . Years of Education: N/A   Occupational History  . Not on file.   Social History Main Topics  . Smoking status: Former Smoker -- 1.00 packs/day for 26 years    Types: Cigarettes    Quit date: 08/13/2007  . Smokeless tobacco: Never Used     Comment:  She smoked one pack per day for 26 years but quit in 2009.  Marland Kitchen Alcohol Use: Not on file  . Drug Use: Not on file  . Sexual Activity: Not on file   Other Topics Concern  . Not on file   Social History Narrative   Works 3rd shift in [employer], married.  She smoked one pack per day for 26 years but quit in 2009.          Current Outpatient Prescriptions on File Prior to Visit  Medication Sig Dispense Refill  . aspirin 81 MG EC tablet Take 81 mg by mouth daily.      Marland Kitchen  atorvastatin (LIPITOR) 80 MG tablet Take 1 tablet (80 mg total) by mouth daily. 90 tablet 3  . BD ULTRA-FINE PEN NEEDLES 29G X 12.7MM MISC USE 5 TIMES A DAY AS DIRECTED 5 each 2  . diltiazem (DILT-XR) 120 MG 24 hr capsule Take 1 capsule (120 mg total) by mouth daily. 90 capsule 3  . glucose blood (ONE TOUCH ULTRA TEST) test strip 1 each by Other route 3 (three) times daily. And lancets 3/day 250.01 300 each 3  . insulin glargine (LANTUS) 100 UNIT/ML injection Inject 160 Units into the skin daily.    . insulin lispro (HUMALOG KWIKPEN) 100 UNIT/ML KiwkPen INJECT 120 UNITS INTO THE SKIN THREE TIMES A DAY (Patient taking differently: Inject 130 Units into the skin 3 (three) times daily with meals. ) 110 pen 1  . ipratropium (ATROVENT) 0.03 % nasal spray Place 2 sprays into the nose 2 (two) times daily. 30 mL 5  . LANTUS SOLOSTAR 100 UNIT/ML Solostar Pen INJECT 180 UNITS UNDER THE SKIN DAILY 11 pen 5  . lisinopril-hydrochlorothiazide (PRINZIDE,ZESTORETIC) 20-12.5 MG per tablet Take 2 tablets by mouth daily. 180 tablet 3   No current facility-administered medications on file prior to visit.  No Known Allergies  Family History  Problem Relation Age of Onset  . Breast cancer Mother   . Heart disease Mother   . Cancer Mother     Breast  . Coronary artery disease Father     starting in his 16s. A bypass and mitral infarction  . Heart disease Father     Coronary Disease, bypass and mitral infarction    BP 174/77 mmHg  Pulse 93  Wt 245 lb 9.6 oz (111.403 kg)  Review of Systems Denies LOC and insulin site pain.     Objective:   Physical Exam VITAL SIGNS:  See vs page. GENERAL: no distress. Pulses: dorsalis pedis intact bilat.   MSK: no deformity of the feet CV: trace bilat leg edema Skin:  no ulcer on the feet.  normal color and temp on the feet. Neuro: sensation is intact to touch on the feet.  Right hand: normal to my exam, except slight swelling of the ring finger MCP   Lab  Results  Component Value Date   HGBA1C 7.4* 03/12/2014   X-ray: normal     Assessment & Plan:  Hand pain, new, uncertain etiology DM: she needs increased rx.  Patient is advised the following: Patient Instructions  blood tests and an x-ray are being requested for you today.  We'll contact you with results.  Please continue to pursue the weight-loss surgery.    Please come back for a follow-up appointment 3-4 months.   check your blood sugar twice a day.  vary the time of day when you check, between before the 3 meals, and at bedtime.  also check if you have symptoms of your blood sugar being too high or too low.  please keep a record of the readings and bring it to your next appointment here.  You can write it on any piece of paper.  please call us sooner if your blood sugar goes below 70, or if you have a lot of readings over 200.     addendum: increase the humalog to 130 units 3 times a day (just before each meal).

## 2014-03-12 NOTE — Patient Instructions (Addendum)
blood tests and an x-ray are being requested for you today.  We'll contact you with results.  Please continue to pursue the weight-loss surgery.    Please come back for a follow-up appointment 3-4 months.   check your blood sugar twice a day.  vary the time of day when you check, between before the 3 meals, and at bedtime.  also check if you have symptoms of your blood sugar being too high or too low.  please keep a record of the readings and bring it to your next appointment here.  You can write it on any piece of paper.  please call us sooner if your blood sugar goes below 70, or if you have a lot of readings over 200.

## 2014-03-15 ENCOUNTER — Other Ambulatory Visit: Payer: Self-pay | Admitting: Endocrinology

## 2014-03-15 DIAGNOSIS — M79641 Pain in right hand: Secondary | ICD-10-CM

## 2014-03-19 ENCOUNTER — Other Ambulatory Visit: Payer: Self-pay | Admitting: Endocrinology

## 2014-03-20 MED ORDER — INSULIN GLARGINE 100 UNIT/ML ~~LOC~~ SOLN: 160.0000 [IU] | Freq: Every day | SUBCUTANEOUS | Status: DC

## 2014-03-26 ENCOUNTER — Encounter: Payer: Self-pay | Admitting: Endocrinology

## 2014-04-03 ENCOUNTER — Encounter: Payer: 59 | Attending: Endocrinology | Admitting: Nutrition

## 2014-04-03 ENCOUNTER — Other Ambulatory Visit: Payer: Self-pay

## 2014-04-03 DIAGNOSIS — Z713 Dietary counseling and surveillance: Secondary | ICD-10-CM | POA: Diagnosis not present

## 2014-04-03 DIAGNOSIS — Z794 Long term (current) use of insulin: Secondary | ICD-10-CM | POA: Diagnosis not present

## 2014-04-03 DIAGNOSIS — E109 Type 1 diabetes mellitus without complications: Secondary | ICD-10-CM | POA: Insufficient documentation

## 2014-04-03 MED ORDER — "INSULIN SYRINGE 31G X 5/16"" 1 ML MISC": Status: DC

## 2014-04-03 NOTE — Progress Notes (Signed)
Catherine Moon was shown how to use a vial and syringe for her Lantus doses.  She was accidentally sent vials in stead of pens.  She was given U-100 syringes and she redemonstrated how to put air into the vial and draw up the correct dose (160u).  She was given a handout with the steps for this procedure as well.  She had no final questions.  She requested a 1/2 inch needle, and all I had were 40mm.  The prescription was called in for 1cc, 1/2 inch insulin syringes.

## 2014-04-03 NOTE — Patient Instructions (Signed)
Follow directions for vial and syringe insulin injections. Call if questions.

## 2014-05-30 ENCOUNTER — Telehealth: Payer: Self-pay | Admitting: Endocrinology

## 2014-05-30 NOTE — Telephone Encounter (Signed)
Pt has an appt on 07/16/2014 and her B/P will be rechecked then

## 2014-06-10 LAB — HM MAMMOGRAPHY: HM Mammogram: NORMAL

## 2014-06-17 ENCOUNTER — Telehealth: Payer: Self-pay | Admitting: *Deleted

## 2014-06-17 NOTE — Telephone Encounter (Signed)
Left message for patient to schedule her mammogram and call us back with the appointment time

## 2014-06-28 ENCOUNTER — Other Ambulatory Visit: Payer: Self-pay | Admitting: Endocrinology

## 2014-07-01 ENCOUNTER — Other Ambulatory Visit: Payer: Self-pay

## 2014-07-16 ENCOUNTER — Encounter: Payer: Self-pay | Admitting: Endocrinology

## 2014-07-16 ENCOUNTER — Encounter (HOSPITAL_COMMUNITY): Payer: Self-pay | Admitting: Emergency Medicine

## 2014-07-16 ENCOUNTER — Ambulatory Visit (INDEPENDENT_AMBULATORY_CARE_PROVIDER_SITE_OTHER): Payer: 59 | Admitting: Endocrinology

## 2014-07-16 ENCOUNTER — Emergency Department (HOSPITAL_COMMUNITY)
Admission: EM | Admit: 2014-07-16 | Discharge: 2014-07-16 | Disposition: A | Discharge status: Home / Self Care | Payer: 59 | Attending: Emergency Medicine | Admitting: Emergency Medicine

## 2014-07-16 ENCOUNTER — Emergency Department (HOSPITAL_COMMUNITY): Payer: 59

## 2014-07-16 VITALS — BP 140/78 | HR 103 | Temp 98.1°F | Resp 16 | Ht 66.0 in | Wt 249.2 lb

## 2014-07-16 DIAGNOSIS — Y999 Unspecified external cause status: Secondary | ICD-10-CM | POA: Diagnosis not present

## 2014-07-16 DIAGNOSIS — E119 Type 2 diabetes mellitus without complications: Secondary | ICD-10-CM | POA: Insufficient documentation

## 2014-07-16 DIAGNOSIS — S46912A Strain of unspecified muscle, fascia and tendon at shoulder and upper arm level, left arm, initial encounter: Secondary | ICD-10-CM | POA: Diagnosis not present

## 2014-07-16 DIAGNOSIS — Z7982 Long term (current) use of aspirin: Secondary | ICD-10-CM | POA: Insufficient documentation

## 2014-07-16 DIAGNOSIS — S3992XA Unspecified injury of lower back, initial encounter: Secondary | ICD-10-CM | POA: Diagnosis not present

## 2014-07-16 DIAGNOSIS — Y939 Activity, unspecified: Secondary | ICD-10-CM | POA: Insufficient documentation

## 2014-07-16 DIAGNOSIS — I10 Essential (primary) hypertension: Secondary | ICD-10-CM | POA: Diagnosis not present

## 2014-07-16 DIAGNOSIS — Z79899 Other long term (current) drug therapy: Secondary | ICD-10-CM | POA: Insufficient documentation

## 2014-07-16 DIAGNOSIS — T148XXA Other injury of unspecified body region, initial encounter: Secondary | ICD-10-CM

## 2014-07-16 DIAGNOSIS — S4992XA Unspecified injury of left shoulder and upper arm, initial encounter: Secondary | ICD-10-CM | POA: Diagnosis present

## 2014-07-16 DIAGNOSIS — Z87891 Personal history of nicotine dependence: Secondary | ICD-10-CM | POA: Diagnosis not present

## 2014-07-16 DIAGNOSIS — E780 Pure hypercholesterolemia: Secondary | ICD-10-CM | POA: Insufficient documentation

## 2014-07-16 DIAGNOSIS — E109 Type 1 diabetes mellitus without complications: Secondary | ICD-10-CM | POA: Diagnosis not present

## 2014-07-16 DIAGNOSIS — Z794 Long term (current) use of insulin: Secondary | ICD-10-CM | POA: Diagnosis not present

## 2014-07-16 DIAGNOSIS — E785 Hyperlipidemia, unspecified: Secondary | ICD-10-CM | POA: Diagnosis not present

## 2014-07-16 DIAGNOSIS — Y92410 Unspecified street and highway as the place of occurrence of the external cause: Secondary | ICD-10-CM | POA: Diagnosis not present

## 2014-07-16 LAB — BASIC METABOLIC PANEL
Anion gap: 11 (ref 5–15)
BUN: 16 mg/dL (ref 6–20)
CO2: 26 mmol/L (ref 22–32)
Calcium: 10.1 mg/dL (ref 8.9–10.3)
Chloride: 100 mmol/L — ABNORMAL LOW (ref 101–111)
Creatinine, Ser: 0.75 mg/dL (ref 0.44–1.00)
GFR calc Af Amer: >60 mL/min (ref >60)
GFR calc non Af Amer: >60 mL/min (ref >60)
GLUCOSE: 204 mg/dL — AB (ref 65–99)
Potassium: 3.5 mmol/L (ref 3.5–5.1)
SODIUM: 137 mmol/L (ref 135–145)

## 2014-07-16 LAB — URINALYSIS, ROUTINE W REFLEX MICROSCOPIC
Bilirubin Urine: NEGATIVE
Glucose, UA: NEGATIVE mg/dL
KETONES UR: NEGATIVE mg/dL
LEUKOCYTES UA: NEGATIVE
Nitrite: NEGATIVE
PH: 6 (ref 5.0–8.0)
PROTEIN: NEGATIVE mg/dL
SPECIFIC GRAVITY, URINE: 1.009 (ref 1.005–1.030)
UROBILINOGEN UA: 0.2 mg/dL (ref 0.0–1.0)

## 2014-07-16 LAB — CBC
HCT: 42.7 % (ref 36.0–46.0)
Hemoglobin: 14.4 g/dL (ref 12.0–15.0)
MCH: 30.6 pg (ref 26.0–34.0)
MCHC: 33.7 g/dL (ref 30.0–36.0)
MCV: 90.7 fL (ref 78.0–100.0)
PLATELETS: 401 10*3/uL — AB (ref 150–400)
RBC: 4.71 MIL/uL (ref 3.87–5.11)
RDW: 13.4 % (ref 11.5–15.5)
WBC: 18.9 10*3/uL — ABNORMAL HIGH (ref 4.0–10.5)

## 2014-07-16 LAB — URINE MICROSCOPIC-ADD ON

## 2014-07-16 LAB — POCT GLYCOSYLATED HEMOGLOBIN (HGB A1C): Hemoglobin A1C: 7.3

## 2014-07-16 LAB — TROPONIN I: Troponin I: <0.03 ng/mL (ref <0.031)

## 2014-07-16 MED ORDER — INSULIN LISPRO 100 UNIT/ML (KWIKPEN): 140.0000 [IU] | PEN_INJECTOR | Freq: Three times a day (TID) | SUBCUTANEOUS | Status: DC

## 2014-07-16 MED ORDER — ACETAMINOPHEN 500 MG PO TABS
1000.0000 mg | ORAL_TABLET | Freq: Once | ORAL | Status: AC
  Administered 2014-07-16: 1000 mg via ORAL
  Filled 2014-07-16: qty 2

## 2014-07-16 NOTE — Progress Notes (Signed)
Subjective:    Patient ID: Catherine Moon, female    DOB: 1960/10/17, 54 y.o.   MRN: [id number]  HPI Pt returns for f/u of diabetes mellitus: DM type: Insulin-requiring type 2 Dx'ed: 4259 Complications: none Therapy: insulin since 2010 GDM: never  DKA: never Severe hypoglycemia: never Pancreatitis: never Other: DM is characterized by severe insulin resistance; she takes multiple daily injections; she works 3rd shift. Interval history: She seldom has hypoglycemia, and these episodes are mild.  This happens when a meal is missed or delayed.  pt states she feels well in general.  no cbg record, but states cbg's are well-controlled.  She says cbg's are higher after eating, than if she hasn't eaten in some hours.   Past Medical History  Diagnosis Date  . DM (diabetes mellitus)   . HTN (hypertension)   . Dyslipidemia   . Warts, genital   . Smoker   . CTS (carpal tunnel syndrome)   . Hypercholesterolemia     Past Surgical History  Procedure Laterality Date  . Dilation and curettage of uterus      History   Social History  . Marital Status: Married    Spouse Name: N/A  . Number of Children: N/A  . Years of Education: N/A   Occupational History  . Not on file.   Social History Main Topics  . Smoking status: Former Smoker -- 1.00 packs/day for 26 years    Types: Cigarettes    Quit date: 08/13/2007  . Smokeless tobacco: Never Used     Comment:  She smoked one pack per day for 26 years but quit in 2009.  Marland Kitchen Alcohol Use: Not on file  . Drug Use: Not on file  . Sexual Activity: Not on file   Other Topics Concern  . Not on file   Social History Narrative   Works 3rd shift in [employer], married.  She smoked one pack per day for 26 years but quit in 2009.          Current Outpatient Prescriptions on File Prior to Visit  Medication Sig Dispense Refill  . aspirin 81 MG EC tablet Take 81 mg by mouth daily.      Marland Kitchen atorvastatin (LIPITOR) 80 MG tablet Take 1  tablet (80 mg total) by mouth daily. 90 tablet 3  . BD ULTRA-FINE PEN NEEDLES 29G X 12.7MM MISC USE 5 TIMES A DAY AS DIRECTED 5 each 2  . diltiazem (DILT-XR) 120 MG 24 hr capsule Take 1 capsule (120 mg total) by mouth daily. 90 capsule 3  . glucose blood (ONE TOUCH ULTRA TEST) test strip 1 each by Other route 3 (three) times daily. And lancets 3/day 250.01 300 each 3  . Insulin Syringe-Needle U-100 (INSULIN SYRINGE 1CC/31GX5/16") 31G X 5/16" 1 ML MISC Use to inject insulin twice per day. 200 each 0  . ipratropium (ATROVENT) 0.03 % nasal spray Place 2 sprays into the nose 2 (two) times daily. (Patient not taking: Reported on 07/16/2014) 30 mL 5  . lisinopril-hydrochlorothiazide (PRINZIDE,ZESTORETIC) 20-12.5 MG per tablet Take 2 tablets by mouth daily. 180 tablet 3   No current facility-administered medications on file prior to visit.   No Known Allergies  Family History  Problem Relation Age of Onset  . Breast cancer Mother   . Heart disease Mother   . Cancer Mother     Breast  . Coronary artery disease Father     starting in his 56s. A bypass and mitral infarction  .  Heart disease Father     Coronary Disease, bypass and mitral infarction    BP 140/78 mmHg  Pulse 103  Temp(Src) 98.1 F (36.7 C) (Oral)  Resp 16  Ht 5\' 6"  (1.676 m)  Wt 249 lb 3.2 oz (113.036 kg)  BMI 40.24 kg/m2  SpO2 93%  Review of Systems Denies LOC.    Objective:   Physical Exam VITAL SIGNS:  See vs page GENERAL: no distress Pulses: dorsalis pedis intact bilat.   MSK: no deformity of the feet CV: no leg edema Skin:  no ulcer on the feet.  normal color and temp on the feet. Neuro: sensation is intact to touch on the feet.    A1c=7.3%    Assessment & Plan:  DM: Needs increased rx, if it can be done with a regimen that avoids or minimizes hypoglycemia.  Patient is advised the following: Patient Instructions  Please come back for a regular physical appointment in 3 months. check your blood sugar  twice a day.  vary the time of day when you check, between before the 3 meals, and at bedtime.  also check if you have symptoms of your blood sugar being too high or too low.  please keep a record of the readings and bring it to your next appointment here.  You can write it on any piece of paper.  please call us sooner if your blood sugar goes below 70, or if you have a lot of readings over 200. Please change the insulin to the numbers as below.
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
## 2014-07-16 NOTE — Patient Instructions (Addendum)
Please come back for a regular physical appointment in 3 months. check your blood sugar twice a day.  vary the time of day when you check, between before the 3 meals, and at bedtime.  also check if you have symptoms of your blood sugar being too high or too low.  please keep a record of the readings and bring it to your next appointment here.  You can write it on any piece of paper.  please call us sooner if your blood sugar goes below 70, or if you have a lot of readings over 200. Please change the insulin to the numbers as below.

## 2014-07-24 ENCOUNTER — Other Ambulatory Visit: Payer: Self-pay | Admitting: Endocrinology

## 2014-10-16 ENCOUNTER — Ambulatory Visit: Payer: 59 | Admitting: Endocrinology

## 2014-11-01 ENCOUNTER — Ambulatory Visit (INDEPENDENT_AMBULATORY_CARE_PROVIDER_SITE_OTHER): Payer: 59 | Admitting: Endocrinology

## 2014-11-01 ENCOUNTER — Encounter: Payer: Self-pay | Admitting: Endocrinology

## 2014-11-01 VITALS — BP 142/86 | HR 96 | Temp 98.7°F | Ht 66.0 in | Wt 254.0 lb

## 2014-11-01 DIAGNOSIS — Z23 Encounter for immunization: Secondary | ICD-10-CM | POA: Diagnosis not present

## 2014-11-01 DIAGNOSIS — Z0000 Encounter for general adult medical examination without abnormal findings: Secondary | ICD-10-CM

## 2014-11-01 DIAGNOSIS — I10 Essential (primary) hypertension: Secondary | ICD-10-CM

## 2014-11-01 DIAGNOSIS — E109 Type 1 diabetes mellitus without complications: Secondary | ICD-10-CM | POA: Diagnosis not present

## 2014-11-01 DIAGNOSIS — E7800 Pure hypercholesterolemia, unspecified: Secondary | ICD-10-CM

## 2014-11-01 LAB — URINALYSIS, ROUTINE W REFLEX MICROSCOPIC
Bilirubin Urine: NEGATIVE
HGB URINE DIPSTICK: NEGATIVE
KETONES UR: NEGATIVE
Nitrite: NEGATIVE
Specific Gravity, Urine: 1.025 (ref 1.000–1.030)
Total Protein, Urine: NEGATIVE
Urine Glucose: NEGATIVE
Urobilinogen, UA: 0.2 (ref 0.0–1.0)
pH: 5.5 (ref 5.0–8.0)

## 2014-11-01 LAB — HEPATIC FUNCTION PANEL
ALBUMIN: 4 g/dL (ref 3.5–5.2)
ALT: 19 U/L (ref 0–35)
AST: 16 U/L (ref 0–37)
Alkaline Phosphatase: 67 U/L (ref 39–117)
Bilirubin, Direct: 0.1 mg/dL (ref 0.0–0.3)
TOTAL PROTEIN: 6.8 g/dL (ref 6.0–8.3)
Total Bilirubin: 0.3 mg/dL (ref 0.2–1.2)

## 2014-11-01 LAB — CBC WITH DIFFERENTIAL/PLATELET
BASOS ABS: 0.2 10*3/uL — AB (ref 0.0–0.1)
Basophils Relative: 1.5 % (ref 0.0–3.0)
Eosinophils Absolute: 0.2 10*3/uL (ref 0.0–0.7)
Eosinophils Relative: 1.6 % (ref 0.0–5.0)
HEMATOCRIT: 43.3 % (ref 36.0–46.0)
Hemoglobin: 14.4 g/dL (ref 12.0–15.0)
LYMPHS PCT: 25.9 % (ref 12.0–46.0)
Lymphs Abs: 3 10*3/uL (ref 0.7–4.0)
MCHC: 33.3 g/dL (ref 30.0–36.0)
MCV: 91.9 fl (ref 78.0–100.0)
Monocytes Absolute: 1 10*3/uL (ref 0.1–1.0)
Monocytes Relative: 8.2 % (ref 3.0–12.0)
Neutro Abs: 7.3 10*3/uL (ref 1.4–7.7)
Neutrophils Relative %: 62.8 % (ref 43.0–77.0)
Platelets: 406 10*3/uL — ABNORMAL HIGH (ref 150.0–400.0)
RBC: 4.71 Mil/uL (ref 3.87–5.11)
RDW: 13.6 % (ref 11.5–15.5)
WBC: 11.7 10*3/uL — ABNORMAL HIGH (ref 4.0–10.5)

## 2014-11-01 LAB — BASIC METABOLIC PANEL
BUN: 16 mg/dL (ref 6–23)
CALCIUM: 9.4 mg/dL (ref 8.4–10.5)
CO2: 27 meq/L (ref 19–32)
Chloride: 104 mEq/L (ref 96–112)
Creatinine, Ser: 0.71 mg/dL (ref 0.40–1.20)
GFR: 91.26 mL/min (ref >60.00)
Glucose, Bld: 161 mg/dL — ABNORMAL HIGH (ref 70–99)
Potassium: 3.4 mEq/L — ABNORMAL LOW (ref 3.5–5.1)
SODIUM: 140 meq/L (ref 135–145)

## 2014-11-01 LAB — LIPID PANEL
Cholesterol: 171 mg/dL (ref 0–200)
HDL: 47.1 mg/dL (ref >39.00)
LDL Cholesterol: 88 mg/dL (ref 0–99)
NonHDL: 123.93
TRIGLYCERIDES: 180 mg/dL — AB (ref 0.0–149.0)
Total CHOL/HDL Ratio: 4
VLDL: 36 mg/dL (ref 0.0–40.0)

## 2014-11-01 LAB — MICROALBUMIN / CREATININE URINE RATIO
CREATININE, U: 183.6 mg/dL
Microalb Creat Ratio: 0.9 mg/g (ref 0.0–30.0)
Microalb, Ur: 1.7 mg/dL (ref 0.0–1.9)

## 2014-11-01 LAB — TSH: TSH: 1.49 u[IU]/mL (ref 0.35–4.50)

## 2014-11-01 LAB — POCT GLYCOSYLATED HEMOGLOBIN (HGB A1C): HEMOGLOBIN A1C: 7.8

## 2014-11-01 MED ORDER — INSULIN LISPRO 100 UNIT/ML (KWIKPEN): 150.0000 [IU] | PEN_INJECTOR | Freq: Three times a day (TID) | SUBCUTANEOUS | Status: DC

## 2014-11-01 NOTE — Progress Notes (Signed)
Subjective:    Patient ID: Catherine Moon, female    DOB: 09/29/1960, 54 y.o.   MRN: [id number]  HPI Pt is here for regular wellness examination, and is feeling pretty well in general, and says chronic med probs are stable, except as noted below Past Medical History  Diagnosis Date  . DM (diabetes mellitus) ([place])   . HTN (hypertension)   . Dyslipidemia   . Warts, genital   . Smoker   . CTS (carpal tunnel syndrome)   . Hypercholesterolemia     Past Surgical History  Procedure Laterality Date  . Dilation and curettage of uterus      Social History   Social History  . Marital Status: Married    Spouse Name: N/A  . Number of Children: N/A  . Years of Education: N/A   Occupational History  . Not on file.   Social History Main Topics  . Smoking status: Former Smoker -- 1.00 packs/day for 26 years    Types: Cigarettes    Quit date: 08/13/2007  . Smokeless tobacco: Never Used     Comment:  She smoked one pack per day for 26 years but quit in 2009.  Marland Kitchen Alcohol Use: Not on file  . Drug Use: Not on file  . Sexual Activity: Not on file   Other Topics Concern  . Not on file   Social History Narrative   Works 3rd shift in [employer], married.  She smoked one pack per day for 26 years but quit in 2009.          Current Outpatient Prescriptions on File Prior to Visit  Medication Sig Dispense Refill  . acetaminophen (TYLENOL) 500 MG tablet Take 1,000 mg by mouth every 6 (six) hours as needed for mild pain, moderate pain, fever or headache.    Marland Kitchen aspirin 81 MG EC tablet Take 81 mg by mouth daily.      Marland Kitchen atorvastatin (LIPITOR) 80 MG tablet TAKE 1 TABLET DAILY 90 tablet 2  . BD ULTRA-FINE PEN NEEDLES 29G X 12.7MM MISC USE 5 TIMES A DAY AS DIRECTED 5 each 2  . diltiazem (DILT-XR) 120 MG 24 hr capsule Take 1 capsule (120 mg total) by mouth daily. 90 capsule 3  . fluticasone (FLONASE) 50 MCG/ACT nasal spray Place 1 spray into both nostrils daily as needed for  allergies or rhinitis.    Marland Kitchen glucose blood (ONE TOUCH ULTRA TEST) test strip 1 each by Other route 3 (three) times daily. And lancets 3/day 250.01 300 each 3  . Insulin Glargine (LANTUS SOLOSTAR) 100 UNIT/ML Solostar Pen Inject 130 Units into the skin daily.     . Insulin Syringe-Needle U-100 (INSULIN SYRINGE 1CC/31GX5/16") 31G X 5/16" 1 ML MISC Use to inject insulin twice per day. 200 each 0  . ipratropium (ATROVENT) 0.03 % nasal spray Place 2 sprays into the nose 2 (two) times daily. 30 mL 5  . lisinopril-hydrochlorothiazide (PRINZIDE,ZESTORETIC) 20-12.5 MG per tablet Take 2 tablets by mouth daily. 180 tablet 3  . Multiple Vitamin (MULTIVITAMIN WITH MINERALS) TABS tablet Take 1 tablet by mouth daily.    Marland Kitchen PRESCRIPTION MEDICATION Cortizone Injection in finger on right hand    . zinc gluconate 50 MG tablet Take 50 mg by mouth daily.     No current facility-administered medications on file prior to visit.    No Known Allergies  Family History  Problem Relation Age of Onset  . Breast cancer Mother   . Heart disease Mother   .  Cancer Mother     Breast  . Coronary artery disease Father     starting in his 12s. A bypass and mitral infarction  . Heart disease Father     Coronary Disease, bypass and mitral infarction    BP 142/86 mmHg  Pulse 96  Temp(Src) 98.7 F (37.1 C) (Oral)  Ht 5\' 6"  (1.676 m)  Wt 254 lb (115.214 kg)  BMI 41.02 kg/m2  SpO2 93%  Review of Systems  Constitutional: Negative for fever.  HENT: Negative for hearing loss.   Eyes: Negative for visual disturbance.  Respiratory: Negative for shortness of breath.   Cardiovascular: Negative for chest pain.  Gastrointestinal: Negative for blood in stool.  Endocrine: Negative for cold intolerance.  Genitourinary: Negative for hematuria.  Musculoskeletal: Negative for back pain.  Skin: Negative for rash.  Allergic/Immunologic: Positive for environmental allergies.  Neurological: Negative for syncope.  Hematological:  Does not bruise/bleed easily.  Psychiatric/Behavioral: Negative for dysphoric mood.       Objective:   Physical Exam VS: see vs page GEN: no distress HEAD: head: no deformity eyes: no periorbital swelling, no proptosis external nose and ears are normal mouth: no lesion seen NECK: supple, thyroid is not enlarged CHEST WALL: no deformity LUNGS:  Clear to auscultation BREASTS: sees gyn CV: reg rate and rhythm, no murmur ABD: abdomen is soft, nontender.  no hepatosplenomegaly.  not distended.  no hernia GENITALIA/RECTAL: sees gyn MUSCULOSKELETAL: muscle bulk and strength are grossly normal.  no obvious joint swelling.  gait is normal and steady PULSES: no carotid bruit NEURO:  cn 2-12 grossly intact.   readily moves all 4's.  SKIN:  Normal texture and temperature.  No rash or suspicious lesion is visible.   NODES:  None palpable at the neck PSYCH: alert, well-oriented.  Does not appear anxious nor depressed.     Assessment & Plan:  Wellness visit today, with problems stable, except as noted.   SEPARATE EVALUATION FOLLOWS--EACH PROBLEM HERE IS NEW, NOT RESPONDING TO TREATMENT, OR POSES SIGNIFICANT RISK TO THE PATIENT'S HEALTH: HISTORY OF THE PRESENT ILLNESS: Pt returns for f/u of diabetes mellitus: DM type: Insulin-requiring type 2 Dx'ed: 3151 Complications: none Therapy: insulin since 2010 GDM: never  DKA: never Severe hypoglycemia: never Pancreatitis: never Other: DM is characterized by severe insulin resistance; she takes multiple daily injections; she works 3rd shift. Interval history: She says cbg's are higher after eating, than if she hasn't eaten in some hours.  She seldom has hypoglycemia, and these episodes are mild.   PAST MEDICAL HISTORY reviewed and up to date today REVIEW OF SYSTEMS: She has gained weight. PHYSICAL EXAMINATION: VITAL SIGNS:  See vs page GENERAL: no distress Pulses: dorsalis pedis intact bilat.   MSK: no deformity of the feet CV: trace  bilat leg edema Skin:  no ulcer on the feet.  normal color and temp on the feet. Neuro: sensation is intact to touch on the feet LAB/XRAY RESULTS: Lab Results  Component Value Date   WBC 11.7* 11/01/2014   HGB 14.4 11/01/2014   HCT 43.3 11/01/2014   PLT 406.0* 11/01/2014   GLUCOSE 161* 11/01/2014   CHOL 171 11/01/2014   TRIG 180.0* 11/01/2014   HDL 47.10 11/01/2014   LDLDIRECT 173.9 11/18/2009   LDLCALC 88 11/01/2014   ALT 19 11/01/2014   AST 16 11/01/2014   NA 140 11/01/2014   K 3.4* 11/01/2014   CL 104 11/01/2014   CREATININE 0.71 11/01/2014   BUN 16 11/01/2014   CO2 27  11/01/2014   TSH 1.49 11/01/2014   HGBA1C 7.8 11/01/2014   MICROALBUR 1.7 11/01/2014   IMPRESSION: DM: Based on the pattern of her cbg's, she needs some adjustment in her therapy Leukocytosis, mild.  We'll follow Hypokalemia: mild.  We'll follow PLAN:  Change insulin to the numbers noted below

## 2014-11-01 NOTE — Patient Instructions (Addendum)
please consider these measures for your health:  minimize alcohol.  do not use tobacco products.  have a colonoscopy at least every 10 years from age 54.  Women should have an annual mammogram from age 52.  keep firearms safely stored.  always use seat belts.  have working smoke alarms in your home.  see an eye doctor and dentist regularly.  never drive under the influence of alcohol or drugs (including prescription drugs).  those with fair skin should take precautions against the sun. blood tests are requested for you today.  We'll let you know about the results.

## 2014-11-07 ENCOUNTER — Other Ambulatory Visit: Payer: Self-pay | Admitting: Endocrinology

## 2014-11-26 ENCOUNTER — Encounter: Payer: Self-pay | Admitting: Endocrinology

## 2014-12-17 ENCOUNTER — Encounter: Payer: Self-pay | Admitting: Gastroenterology

## 2015-01-31 ENCOUNTER — Ambulatory Visit (AMBULATORY_SURGERY_CENTER): Payer: Self-pay

## 2015-01-31 VITALS — Ht 66.0 in | Wt 255.8 lb

## 2015-01-31 DIAGNOSIS — Z1211 Encounter for screening for malignant neoplasm of colon: Secondary | ICD-10-CM

## 2015-01-31 MED ORDER — SUPREP BOWEL PREP KIT 17.5-3.13-1.6 GM/177ML PO SOLN: 1.0000 | Freq: Once | ORAL | Status: DC

## 2015-01-31 NOTE — Progress Notes (Signed)
No allergies to eggs or soy No diet/weight loss meds No past problems with anesthesia No home oxygen  Has email and internet; refused emmi

## 2015-02-17 ENCOUNTER — Ambulatory Visit (AMBULATORY_SURGERY_CENTER): Payer: 59 | Admitting: Gastroenterology

## 2015-02-17 ENCOUNTER — Encounter: Payer: Self-pay | Admitting: Gastroenterology

## 2015-02-17 VITALS — BP 167/87 | HR 80 | Temp 98.9°F | Resp 29 | Ht 66.0 in | Wt 255.0 lb

## 2015-02-17 DIAGNOSIS — Z1211 Encounter for screening for malignant neoplasm of colon: Secondary | ICD-10-CM

## 2015-02-17 DIAGNOSIS — D122 Benign neoplasm of ascending colon: Secondary | ICD-10-CM | POA: Diagnosis not present

## 2015-02-17 DIAGNOSIS — D125 Benign neoplasm of sigmoid colon: Secondary | ICD-10-CM

## 2015-02-17 DIAGNOSIS — D123 Benign neoplasm of transverse colon: Secondary | ICD-10-CM | POA: Diagnosis not present

## 2015-02-17 DIAGNOSIS — D120 Benign neoplasm of cecum: Secondary | ICD-10-CM | POA: Diagnosis not present

## 2015-02-17 HISTORY — PX: COLONOSCOPY WITH PROPOFOL: SHX5780

## 2015-02-17 LAB — GLUCOSE, CAPILLARY
GLUCOSE-CAPILLARY: 236 mg/dL — AB (ref 65–99)
Glucose-Capillary: 181 mg/dL — ABNORMAL HIGH (ref 65–99)

## 2015-02-17 MED ORDER — SODIUM CHLORIDE 0.9 % IV SOLN: 500.0000 mL | INTRAVENOUS | Status: DC

## 2015-02-17 NOTE — Progress Notes (Signed)
  [facility] Post-op Note  Patient: Catherine Moon  Procedure(s) Performed: colonoscopy  Patient Location: [facility] - Recovery Area  Anesthesia Type: Deep Sedation/Propofol  Level of Consciousness: awake, oriented and patient cooperative  Airway and Oxygen Therapy: Patient Spontanous Breathing  Post-op Pain: none  Post-op Assessment:  Post-op Vital signs reviewed, Patient's Cardiovascular Status Stable, Respiratory Function Stable, Patent Airway, No signs of Nausea or vomiting and Pain level controlled  Post-op Vital Signs: Reviewed and stable  Complications: No apparent anesthesia complications  M[place] Out[place] E 12:07 PM

## 2015-02-17 NOTE — Patient Instructions (Signed)
Discharge instructions given. Handouts on polyps,diverticulosis and hemorrhoids. Hold NSAIDS for 2 weeks. Resume previous medications. YOU HAD AN ENDOSCOPIC PROCEDURE TODAY AT [facility]:   Refer to the procedure report that was given to you for any specific questions about what was found during the examination.  If the procedure report does not answer your questions, please call your gastroenterologist to clarify.  If you requested that your care partner not be given the details of your procedure findings, then the procedure report has been included in a sealed envelope for you to review at your convenience later.  YOU SHOULD EXPECT: Some feelings of bloating in the abdomen. Passage of more gas than usual.  Walking can help get rid of the air that was put into your GI tract during the procedure and reduce the bloating. If you had a lower endoscopy (such as a colonoscopy or flexible sigmoidoscopy) you may notice spotting of blood in your stool or on the toilet paper. If you underwent a bowel prep for your procedure, you may not have a normal bowel movement for a few days.  Please Note:  You might notice some irritation and congestion in your nose or some drainage.  This is from the oxygen used during your procedure.  There is no need for concern and it should clear up in a day or so.  SYMPTOMS TO REPORT IMMEDIATELY:   Following lower endoscopy (colonoscopy or flexible sigmoidoscopy):  Excessive amounts of blood in the stool  Significant tenderness or worsening of abdominal pains  Swelling of the abdomen that is new, acute  Fever of 100F or higher   For urgent or emergent issues, a gastroenterologist can be reached at any hour by calling [phone].   DIET: Your first meal following the procedure should be a small meal and then it is ok to progress to your normal diet. Heavy or fried foods are harder to digest and may make you feel nauseous or bloated.  Likewise, meals  heavy in dairy and vegetables can increase bloating.  Drink plenty of fluids but you should avoid alcoholic beverages for 24 hours.  ACTIVITY:  You should plan to take it easy for the rest of today and you should NOT DRIVE or use heavy machinery until tomorrow (because of the sedation medicines used during the test).    FOLLOW UP: Our staff will call the number listed on your records the next business day following your procedure to check on you and address any questions or concerns that you may have regarding the information given to you following your procedure. If we do not reach you, we will leave a message.  However, if you are feeling well and you are not experiencing any problems, there is no need to return our call.  We will assume that you have returned to your regular daily activities without incident.  If any biopsies were taken you will be contacted by phone or by letter within the next 1-3 weeks.  Please call us at [phone] if you have not heard about the biopsies in 3 weeks.    SIGNATURES/CONFIDENTIALITY: You and/or your care partner have signed paperwork which will be entered into your electronic medical record.  These signatures attest to the fact that that the information above on your After Visit Summary has been reviewed and is understood.  Full responsibility of the confidentiality of this discharge information lies with you and/or your care-partner.

## 2015-02-17 NOTE — Progress Notes (Signed)
Called to room to assist during endoscopic procedure.  Patient ID and intended procedure confirmed with present staff. Received instructions for my participation in the procedure from the performing physician.  

## 2015-02-17 NOTE — Op Note (Signed)
[facility]  [address]. [place], [address]   COLONOSCOPY PROCEDURE REPORT  PATIENT: Catherine Moon, Catherine Moon  MR#: [id number] BIRTHDATE: [birth year] , 81  yrs. old GENDER: female ENDOSCOPIST: Yetta Flock, MD REFERRED BY: Renato Shin MD PROCEDURE DATE:  02/17/2015 PROCEDURE:   Colonoscopy, screening, Colonoscopy with snare polypectomy, and Colonoscopy with biopsy First Screening Colonoscopy - Avg.  risk and is 50 yrs.  old or older - No.  Prior Negative Screening - Now for repeat screening. N/A  History of Adenoma - Now for follow-up colonoscopy & has been > or = to 3 yrs.  N/A  Polyps removed today? Yes ASA CLASS:   Class II INDICATIONS:Screening for colonic neoplasia and Colorectal Neoplasm Risk Assessment for this procedure is average risk. MEDICATIONS: Propofol 400 mg IV  DESCRIPTION OF PROCEDURE:   After the risks benefits and alternatives of the procedure were thoroughly explained, informed consent was obtained.  The digital rectal exam revealed external hemorrhoids.   The LB [id number]  endoscope was introduced through the anus and advanced to the cecum, which was identified by both the appendix and ileocecal valve. No adverse events experienced.   The quality of the prep was adequate  The instrument was then slowly withdrawn as the colon was fully examined. Estimated blood loss is zero unless otherwise noted in this procedure report.   COLON FINDINGS: Moderate diverticulosis was noted in the left and transverse colon.  A diminutive sessile polyp was noted in the cecum and removed with cold forceps.  A 66mm sessile polyp was noted in the ascending colon and removed with cold snare.  Two x 4-31mm sessile polyps were noted in the transverse colon and removed with cold snare.  A 4mm sessile polyp was noted in the transverse colon and removed with cold forceps.  A 4-48mm sessile polyp was noted in the sigmoid colon and removed with cold snare.   Retroflexed views revealed internal hemorrhoids. The time to cecum = 5.3 Withdrawal time = 21.1   The scope was withdrawn and the procedure completed. COMPLICATIONS: There were no immediate complications.  ENDOSCOPIC IMPRESSION: Moderate diverticulosis 6 small polyps removed Internal and external hemorrhoids  RECOMMENDATIONS: Await pathology results No NSAIDs for 2 weeks Resume diet Resume medications  eSigned:  Yetta Flock, MD 02/17/2015 12:07 PM   cc:  Renato Shin MD, the patient   PATIENT NAME:  Catherine, Moon MR#: [id number]

## 2015-02-18 ENCOUNTER — Telehealth: Payer: Self-pay | Admitting: *Deleted

## 2015-02-18 NOTE — Telephone Encounter (Signed)
No answer, left message to call if questions or concerns. 

## 2015-02-23 ENCOUNTER — Encounter: Payer: Self-pay | Admitting: Gastroenterology

## 2015-04-14 ENCOUNTER — Telehealth: Payer: Self-pay | Admitting: Endocrinology

## 2015-04-14 MED ORDER — DILTIAZEM HCL ER 120 MG PO CP24: 120.0000 mg | ORAL_CAPSULE | Freq: Every day | ORAL | Status: DC

## 2015-04-14 NOTE — Telephone Encounter (Signed)
[facility] on [address] needs rx for the diltiazen er

## 2015-04-14 NOTE — Addendum Note (Signed)
Addended by: Rockie Neighbours B on: 04/14/2015 10:30 AM   Modules accepted: Orders

## 2015-05-29 ENCOUNTER — Other Ambulatory Visit: Payer: Self-pay | Admitting: Endocrinology

## 2015-05-30 ENCOUNTER — Other Ambulatory Visit: Payer: Self-pay | Admitting: *Deleted

## 2015-05-30 MED ORDER — INSULIN ASPART 100 UNIT/ML FLEXPEN: 150.0000 [IU] | PEN_INJECTOR | Freq: Three times a day (TID) | SUBCUTANEOUS | Status: DC

## 2015-05-30 NOTE — Telephone Encounter (Signed)
Ins no longer covers Humalog. Ok to change to [employer] per Dr Loanne Drilling.

## 2015-06-09 ENCOUNTER — Other Ambulatory Visit: Payer: Self-pay

## 2015-06-09 ENCOUNTER — Encounter: Payer: Self-pay | Admitting: Endocrinology

## 2015-06-13 NOTE — Telephone Encounter (Signed)
Patient need a prescription for Humalog sent to  [facility], [place] [phone] (Phone) [phone] (Fax)

## 2015-06-16 ENCOUNTER — Other Ambulatory Visit: Payer: Self-pay

## 2015-06-16 MED ORDER — INSULIN ASPART 100 UNIT/ML FLEXPEN: 150.0000 [IU] | PEN_INJECTOR | Freq: Three times a day (TID) | SUBCUTANEOUS | Status: DC

## 2015-06-19 ENCOUNTER — Other Ambulatory Visit: Payer: Self-pay

## 2015-06-19 MED ORDER — INSULIN LISPRO 100 UNIT/ML (KWIKPEN): 150.0000 [IU] | PEN_INJECTOR | Freq: Three times a day (TID) | SUBCUTANEOUS | Status: DC

## 2015-07-03 ENCOUNTER — Other Ambulatory Visit: Payer: Self-pay | Admitting: Endocrinology

## 2015-07-11 ENCOUNTER — Other Ambulatory Visit: Payer: Self-pay | Admitting: Endocrinology

## 2015-07-11 ENCOUNTER — Encounter: Payer: Self-pay | Admitting: Endocrinology

## 2015-07-11 ENCOUNTER — Ambulatory Visit (INDEPENDENT_AMBULATORY_CARE_PROVIDER_SITE_OTHER): Payer: 59 | Admitting: Endocrinology

## 2015-07-11 VITALS — BP 142/60 | HR 99 | Ht 66.0 in | Wt 251.0 lb

## 2015-07-11 DIAGNOSIS — E109 Type 1 diabetes mellitus without complications: Secondary | ICD-10-CM

## 2015-07-11 LAB — POCT GLYCOSYLATED HEMOGLOBIN (HGB A1C): Hemoglobin A1C: 8.2

## 2015-07-11 MED ORDER — INSULIN LISPRO 100 UNIT/ML (KWIKPEN): 160.0000 [IU] | PEN_INJECTOR | Freq: Three times a day (TID) | SUBCUTANEOUS | Status: DC

## 2015-07-11 MED ORDER — DILTIAZEM HCL ER 180 MG PO CP24: 180.0000 mg | ORAL_CAPSULE | Freq: Every day | ORAL | Status: DC

## 2015-07-11 MED ORDER — GLUCOSE BLOOD VI STRP: 1.0000 | ORAL_STRIP | Freq: Three times a day (TID) | Status: DC

## 2015-07-11 MED ORDER — ATORVASTATIN CALCIUM 40 MG PO TABS: 40.0000 mg | ORAL_TABLET | Freq: Every day | ORAL | Status: DC

## 2015-07-11 MED ORDER — INSULIN GLARGINE 100 UNIT/ML SOLOSTAR PEN: 150.0000 [IU] | PEN_INJECTOR | Freq: Every day | SUBCUTANEOUS | Status: DC

## 2015-07-11 NOTE — Patient Instructions (Addendum)
check your blood sugar 3 times a day.  vary the time of day when you check, between before the 3 meals, and at bedtime.  also check if you have symptoms of your blood sugar being too high or too low.  please keep a record of the readings and bring it to your next appointment here (or you can bring the meter itself).  You can write it on any piece of paper.  please call us sooner if your blood sugar goes below 70, or if you have a lot of readings over 200. Please change the insulin to the numbers listed below.   I have sent a prescription to your pharmacy, to increase the diltiazem, and to reduce the lipitor.  Please come back for a follow-up appointment in 2 months.   blood tests are requested for you today.  We'll let you know about the results.

## 2015-07-11 NOTE — Progress Notes (Signed)
Subjective:    Patient ID: Catherine Moon, female    DOB: [birth year], 55 y.o.   MRN: [id number]  HPI Pt returns for f/u of diabetes mellitus: DM type: Insulin-requiring type 2 Dx'ed: 0000000 Complications: none Therapy: insulin since 2010 GDM: never  DKA: never Severe hypoglycemia: never Pancreatitis: never Other: DM is characterized by severe insulin resistance; she takes multiple daily injections. Interval history: no cbg record, but states cbg's vary from 90-200's.  It is in general higher as the day goes on.  She takes lantus, 150 units per day, and humalog 150 units 3 times a day (just before each meal).   Past Medical History  Diagnosis Date  . DM (diabetes mellitus) ([place])   . HTN (hypertension)   . Dyslipidemia   . Warts, genital   . Smoker   . CTS (carpal tunnel syndrome)   . Hypercholesterolemia     Past Surgical History  Procedure Laterality Date  . Dilation and curettage of uterus    . Wisdom tooth extraction    . Colonoscopy      Social History   Social History  . Marital Status: Married    Spouse Name: N/A  . Number of Children: N/A  . Years of Education: N/A   Occupational History  . Not on file.   Social History Main Topics  . Smoking status: Former Smoker -- 1.00 packs/day for 26 years    Types: Cigarettes    Quit date: 08/13/2007  . Smokeless tobacco: Never Used     Comment:  She smoked one pack per day for 26 years but quit in 2009.  Marland Kitchen Alcohol Use: No  . Drug Use: No  . Sexual Activity: Not on file   Other Topics Concern  . Not on file   Social History Narrative   Works 3rd shift in [employer], married.  She smoked one pack per day for 26 years but quit in 2009.          Current Outpatient Prescriptions on File Prior to Visit  Medication Sig Dispense Refill  . acetaminophen (TYLENOL) 500 MG tablet Take 1,000 mg by mouth every 6 (six) hours as needed for mild pain, moderate pain, fever or headache.    Marland Kitchen aspirin 81 MG EC  tablet Take 81 mg by mouth daily.      . BD ULTRA-FINE PEN NEEDLES 29G X 12.7MM MISC USE 5 TIMES A DAY AS DIRECTED 5 each 2  . lisinopril-hydrochlorothiazide (PRINZIDE,ZESTORETIC) 20-12.5 MG tablet TAKE 2 TABLETS DAILY 180 tablet 2  . Multiple Vitamin (MULTIVITAMIN WITH MINERALS) TABS tablet Take 1 tablet by mouth daily.    Marland Kitchen zinc gluconate 50 MG tablet Take 50 mg by mouth every other day.     . fluticasone (FLONASE) 50 MCG/ACT nasal spray Place 1 spray into both nostrils daily as needed for allergies or rhinitis. Reported on 07/11/2015    . Insulin Syringe-Needle U-100 (INSULIN SYRINGE 1CC/31GX5/16") 31G X 5/16" 1 ML MISC Use to inject insulin twice per day. (Patient not taking: Reported on 07/11/2015) 200 each 0  . PRESCRIPTION MEDICATION Reported on 07/11/2015     No current facility-administered medications on file prior to visit.    No Known Allergies  Family History  Problem Relation Age of Onset  . Breast cancer Mother   . Heart disease Mother   . Cancer Mother     Breast  . Coronary artery disease Father     starting in his 26s. A bypass and  mitral infarction  . Heart disease Father     Coronary Disease, bypass and mitral infarction  . Colon cancer Paternal Grandmother     BP 142/60 mmHg  Pulse 99  Ht 5\' 6"  (1.676 m)  Wt 251 lb (113.853 kg)  BMI 40.53 kg/m2  SpO2 96%    Review of Systems She denies hypoglycemia.      Objective:   Physical Exam VITAL SIGNS:  See vs page.  GENERAL: no distress Pulses: dorsalis pedis intact bilat.   MSK: no deformity of the feet.  CV: no leg edema.  Skin:  no ulcer on the feet.  normal color and temp on the feet. Neuro: sensation is intact to touch on the feet.     A1c=8.2%    Assessment & Plan:  Insulin-requiring type 2 DM: worse HTN: she needs increased rx.   Dyslipidemia; there is a drug interaction between lipitor and diltiazem.  Patient is advised the following: Patient Instructions  check your blood sugar 3 times a  day.  vary the time of day when you check, between before the 3 meals, and at bedtime.  also check if you have symptoms of your blood sugar being too high or too low.  please keep a record of the readings and bring it to your next appointment here (or you can bring the meter itself).  You can write it on any piece of paper.  please call us sooner if your blood sugar goes below 70, or if you have a lot of readings over 200. Please change the insulin to the numbers listed below.   I have sent a prescription to your pharmacy, to increase the diltiazem, and to reduce the lipitor.  Please come back for a follow-up appointment in 2 months.   blood tests are requested for you today.  We'll let you know about the results.

## 2015-08-08 ENCOUNTER — Other Ambulatory Visit: Payer: Self-pay | Admitting: Endocrinology

## 2015-08-20 ENCOUNTER — Other Ambulatory Visit: Payer: Self-pay | Admitting: Endocrinology

## 2015-09-11 ENCOUNTER — Encounter: Payer: Self-pay | Admitting: Endocrinology

## 2015-09-11 ENCOUNTER — Ambulatory Visit (INDEPENDENT_AMBULATORY_CARE_PROVIDER_SITE_OTHER): Payer: 59 | Admitting: Endocrinology

## 2015-09-11 VITALS — BP 144/84 | HR 94 | Ht 66.0 in | Wt 255.0 lb

## 2015-09-11 DIAGNOSIS — E119 Type 2 diabetes mellitus without complications: Secondary | ICD-10-CM | POA: Diagnosis not present

## 2015-09-11 DIAGNOSIS — Z794 Long term (current) use of insulin: Secondary | ICD-10-CM

## 2015-09-11 DIAGNOSIS — E109 Type 1 diabetes mellitus without complications: Secondary | ICD-10-CM | POA: Diagnosis not present

## 2015-09-11 LAB — POCT GLYCOSYLATED HEMOGLOBIN (HGB A1C): Hemoglobin A1C: 8.1

## 2015-09-11 MED ORDER — INSULIN LISPRO 100 UNIT/ML (KWIKPEN): 170.0000 [IU] | PEN_INJECTOR | Freq: Three times a day (TID) | SUBCUTANEOUS | 1 refills | Status: DC

## 2015-09-11 MED ORDER — INSULIN GLARGINE 100 UNIT/ML SOLOSTAR PEN: 130.0000 [IU] | PEN_INJECTOR | Freq: Every day | SUBCUTANEOUS | 1 refills | Status: DC

## 2015-09-11 NOTE — Progress Notes (Signed)
Subjective:    Patient ID: Catherine Moon, female    DOB: [birth year], 55 y.o.   MRN: [id number]  HPI Pt returns for f/u of diabetes mellitus: DM type: Insulin-requiring type 2 Dx'ed: 0000000 Complications: none Therapy: insulin since 2010.  GDM: never  DKA: never Severe hypoglycemia: never Pancreatitis: never Other: DM is characterized by severe insulin resistance; she takes multiple daily injections; she works 3rd shift.   Interval history: no cbg record, but states cbg's vary from 90-200's.  It is in general higher soon after a meal, than if she has not recently eaten.    Past Medical History:  Diagnosis Date  . CTS (carpal tunnel syndrome)   . DM (diabetes mellitus) ([place])   . Dyslipidemia   . HTN (hypertension)   . Hypercholesterolemia   . Smoker   . Warts, genital     Past Surgical History:  Procedure Laterality Date  . COLONOSCOPY    . DILATION AND CURETTAGE OF UTERUS    . WISDOM TOOTH EXTRACTION      Social History   Social History  . Marital status: Married    Spouse name: N/A  . Number of children: N/A  . Years of education: N/A   Occupational History  . Not on file.   Social History Main Topics  . Smoking status: Former Smoker    Packs/day: 1.00    Years: 26.00    Types: Cigarettes    Quit date: 08/13/2007  . Smokeless tobacco: Never Used     Comment:  She smoked one pack per day for 26 years but quit in 2009.  Marland Kitchen Alcohol use No  . Drug use: No  . Sexual activity: Not on file   Other Topics Concern  . Not on file   Social History Narrative   Works 3rd shift in [employer], married.  She smoked one pack per day for 26 years but quit in 2009.          Current Outpatient Prescriptions on File Prior to Visit  Medication Sig Dispense Refill  . acetaminophen (TYLENOL) 500 MG tablet Take 1,000 mg by mouth every 6 (six) hours as needed for mild pain, moderate pain, fever or headache.    Marland Kitchen aspirin 81 MG EC tablet Take 81 mg by mouth daily.       Marland Kitchen atorvastatin (LIPITOR) 40 MG tablet Take 1 tablet (40 mg total) by mouth daily. 90 tablet 3  . BD ULTRA-FINE PEN NEEDLES 29G X 12.7MM MISC USE 5 TIMES A DAY AS DIRECTED 5 each 2  . diltiazem (DILACOR XR) 180 MG 24 hr capsule Take 1 capsule (180 mg total) by mouth daily. Please cancel the 120 mg rx 90 capsule 3  . fluticasone (FLONASE) 50 MCG/ACT nasal spray Place 1 spray into both nostrils daily as needed for allergies or rhinitis. Reported on 07/11/2015    . glucose blood (ONE TOUCH ULTRA TEST) test strip 1 each by Other route 3 (three) times daily. And lancets 3/day 300 each 3  . Insulin Syringe-Needle U-100 (INSULIN SYRINGE 1CC/31GX5/16") 31G X 5/16" 1 ML MISC Use to inject insulin twice per day. 200 each 0  . lisinopril-hydrochlorothiazide (PRINZIDE,ZESTORETIC) 20-12.5 MG tablet Take 2 tablets by mouth  daily 180 tablet 3  . Multiple Vitamin (MULTIVITAMIN WITH MINERALS) TABS tablet Take 1 tablet by mouth daily.    Marland Kitchen PRESCRIPTION MEDICATION Reported on 07/11/2015    . zinc gluconate 50 MG tablet Take 50 mg by mouth every other day.  No current facility-administered medications on file prior to visit.     No Known Allergies  Family History  Problem Relation Age of Onset  . Breast cancer Mother   . Heart disease Mother   . Cancer Mother     Breast  . Coronary artery disease Father     starting in his 27s. A bypass and mitral infarction  . Heart disease Father     Coronary Disease, bypass and mitral infarction  . Colon cancer Paternal Grandmother     BP (!) 144/84   Pulse 94   Ht 5\' 6"  (1.676 m)   Wt 255 lb (115.7 kg)   SpO2 96%   BMI 41.16 kg/m   Review of Systems She denies hypoglycemia.      Objective:   Physical Exam VITAL SIGNS:  See vs page GENERAL: no distress Pulses: dorsalis pedis intact bilat.   MSK: no deformity of the feet CV: no leg edema Skin:  no ulcer on the feet.  normal color and temp on the feet. Neuro: sensation is intact to touch on the  feet.     A1c=8.1%    Assessment & Plan:  Insulin-requiring type 2 DM: Based on the pattern of her cbg's, she needs some adjustment in her therapy

## 2015-09-11 NOTE — Patient Instructions (Addendum)
check your blood sugar 3 times a day.  vary the time of day when you check, between before the 3 meals, and at bedtime.  also check if you have symptoms of your blood sugar being too high or too low.  please keep a record of the readings and bring it to your next appointment here (or you can bring the meter itself).  You can write it on any piece of paper.  please call us sooner if your blood sugar goes below 70, or if you have a lot of readings over 200. Please increase the humalog to 170 units 3 times a day (just before each meal), and:  Decrease the lantus to 130 units daily.   Please come back for a regular physical appointment in 2 months.

## 2015-09-13 DIAGNOSIS — E1165 Type 2 diabetes mellitus with hyperglycemia: Secondary | ICD-10-CM

## 2015-09-13 DIAGNOSIS — Z794 Long term (current) use of insulin: Secondary | ICD-10-CM

## 2015-10-21 ENCOUNTER — Other Ambulatory Visit: Payer: Self-pay | Admitting: Endocrinology

## 2015-10-31 ENCOUNTER — Other Ambulatory Visit: Payer: Self-pay

## 2015-10-31 MED ORDER — DILTIAZEM HCL ER 180 MG PO CP24: 180.0000 mg | ORAL_CAPSULE | Freq: Every day | ORAL | 3 refills | Status: DC

## 2015-11-12 ENCOUNTER — Ambulatory Visit: Payer: 59 | Admitting: Endocrinology

## 2015-12-10 ENCOUNTER — Ambulatory Visit: Payer: 59 | Admitting: Endocrinology

## 2016-01-08 ENCOUNTER — Encounter: Payer: Self-pay | Admitting: Endocrinology

## 2016-01-11 NOTE — Progress Notes (Signed)
Subjective:    Patient ID: Catherine Moon, female    DOB: [birth year], 56 y.o.   MRN: [id number]  HPI Pt is here for regular wellness examination, and is feeling pretty well in general, and says chronic med probs are stable, except as noted below Past Medical History:  Diagnosis Date  . CTS (carpal tunnel syndrome)   . DM (diabetes mellitus) ([place])   . Dyslipidemia   . HTN (hypertension)   . Hypercholesterolemia   . Smoker   . Warts, genital     Past Surgical History:  Procedure Laterality Date  . COLONOSCOPY    . DILATION AND CURETTAGE OF UTERUS    . WISDOM TOOTH EXTRACTION      Social History   Social History  . Marital status: Married    Spouse name: N/A  . Number of children: N/A  . Years of education: N/A   Occupational History  . Not on file.   Social History Main Topics  . Smoking status: Former Smoker    Packs/day: 1.00    Years: 26.00    Types: Cigarettes    Quit date: 08/13/2007  . Smokeless tobacco: Never Used     Comment:  She smoked one pack per day for 26 years but quit in 2009.  Marland Kitchen Alcohol use No  . Drug use: No  . Sexual activity: Not on file   Other Topics Concern  . Not on file   Social History Narrative   Works 3rd shift in [employer], married.  She smoked one pack per day for 26 years but quit in 2009.          Current Outpatient Prescriptions on File Prior to Visit  Medication Sig Dispense Refill  . acetaminophen (TYLENOL) 500 MG tablet Take 1,000 mg by mouth every 6 (six) hours as needed for mild pain, moderate pain, fever or headache.    Marland Kitchen aspirin 81 MG EC tablet Take 81 mg by mouth daily.      Marland Kitchen atorvastatin (LIPITOR) 40 MG tablet Take 1 tablet (40 mg total) by mouth daily. 90 tablet 3  . diltiazem (DILACOR XR) 180 MG 24 hr capsule Take 1 capsule (180 mg total) by mouth daily. 90 capsule 3  . fluticasone (FLONASE) 50 MCG/ACT nasal spray Place 1 spray into both nostrils daily as needed for allergies or rhinitis. Reported  on 07/11/2015    . glucose blood (ONE TOUCH ULTRA TEST) test strip 1 each by Other route 3 (three) times daily. And lancets 3/day 300 each 3  . Insulin Pen Needle (BD ULTRA-FINE PEN NEEDLES) 29G X 12.7MM MISC USE 5 TIMES A DAY AS DIRECTED 500 each 2  . Insulin Syringe-Needle U-100 (INSULIN SYRINGE 1CC/31GX5/16") 31G X 5/16" 1 ML MISC Use to inject insulin twice per day. 200 each 0  . lisinopril-hydrochlorothiazide (PRINZIDE,ZESTORETIC) 20-12.5 MG tablet Take 2 tablets by mouth  daily 180 tablet 3  . Multiple Vitamin (MULTIVITAMIN WITH MINERALS) TABS tablet Take 1 tablet by mouth daily.    Marland Kitchen PRESCRIPTION MEDICATION Reported on 07/11/2015    . zinc gluconate 50 MG tablet Take 50 mg by mouth every other day.      No current facility-administered medications on file prior to visit.     No Known Allergies  Family History  Problem Relation Age of Onset  . Breast cancer Mother   . Heart disease Mother   . Cancer Mother     Breast  . Coronary artery disease Father  starting in his 63s. A bypass and mitral infarction  . Heart disease Father     Coronary Disease, bypass and mitral infarction  . Colon cancer Paternal Grandmother     BP 132/80   Pulse 95   Ht 5\' 6"  (1.676 m)   Wt 257 lb (116.6 kg)   SpO2 95%   BMI 41.48 kg/m     Review of Systems Constitutional: Negative for fever.  HENT: Negative for hearing loss.   Eyes: Negative for visual disturbance.  Respiratory: Negative for shortness of breath.   Cardiovascular: Negative for chest pain.  Gastrointestinal: Negative for blood in stool.  Endocrine: Negative for cold intolerance.  Genitourinary: Negative for hematuria.  Musculoskeletal: positive for chronic back pain.  Skin: Negative for rash.  Allergic/Immunologic: Positive for environmental allergies.  Neurological: Negative for syncope.  Hematological: Does not bruise/bleed easily.  Psychiatric/Behavioral: Negative for dysphoric mood.      Objective:   Physical  Exam VS: see vs page GEN: no distress HEAD: head: no deformity eyes: no periorbital swelling, no proptosis external nose and ears are normal mouth: no lesion seen NECK: supple, thyroid is not enlarged CHEST WALL: no deformity LUNGS:  Clear to auscultation BREASTS: sees gyn CV: reg rate and rhythm, no murmur ABD: abdomen is soft, nontender.  no hepatosplenomegaly.  not distended.  no hernia GENITALIA/RECTAL: sees gyn.   MUSCULOSKELETAL: muscle bulk and strength are grossly normal.  no obvious joint swelling.  gait is normal and steady PULSES: no carotid bruit NEURO:  cn 2-12 grossly intact.   readily moves all 4's.  SKIN:  Normal texture and temperature.  No rash or suspicious lesion is visible.  Skin tags on the neck NODES:  None palpable at the neck.   PSYCH: alert, well-oriented.  Does not appear anxious nor depressed.    A1c=8.0%     Assessment & Plan:  Wellness visit today, with problems stable, except as noted.   SEPARATE EVALUATION FOLLOWS--EACH PROBLEM HERE IS NEW, NOT RESPONDING TO TREATMENT, OR POSES SIGNIFICANT RISK TO THE PATIENT'S HEALTH: HISTORY OF THE PRESENT ILLNESS: Pt returns for f/u of diabetes mellitus: DM type: Insulin-requiring type 2 Dx'ed: 0000000 Complications: none Therapy: insulin since 2010.  GDM: never  DKA: never Severe hypoglycemia: never Pancreatitis: never Other: DM is characterized by severe insulin resistance; she takes multiple daily injections; she works 3rd shift.   Interval history: no cbg record, but states cbg's vary from 90-200's.  It is still in general higher soon after a meal, than if she has not recently eaten.  PAST MEDICAL HISTORY Past Medical History:  Diagnosis Date  . CTS (carpal tunnel syndrome)   . DM (diabetes mellitus) ([place])   . Dyslipidemia   . HTN (hypertension)   . Hypercholesterolemia   . Smoker   . Warts, genital     Past Surgical History:  Procedure Laterality Date  . COLONOSCOPY    . DILATION AND  CURETTAGE OF UTERUS    . WISDOM TOOTH EXTRACTION      Social History   Social History  . Marital status: Married    Spouse name: N/A  . Number of children: N/A  . Years of education: N/A   Occupational History  . Not on file.   Social History Main Topics  . Smoking status: Former Smoker    Packs/day: 1.00    Years: 26.00    Types: Cigarettes    Quit date: 08/13/2007  . Smokeless tobacco: Never Used     Comment:  She smoked one pack per day for 26 years but quit in 2009.  Marland Kitchen Alcohol use No  . Drug use: No  . Sexual activity: Not on file   Other Topics Concern  . Not on file   Social History Narrative   Works 3rd shift in [employer], married.  She smoked one pack per day for 26 years but quit in 2009.          Current Outpatient Prescriptions on File Prior to Visit  Medication Sig Dispense Refill  . acetaminophen (TYLENOL) 500 MG tablet Take 1,000 mg by mouth every 6 (six) hours as needed for mild pain, moderate pain, fever or headache.    Marland Kitchen aspirin 81 MG EC tablet Take 81 mg by mouth daily.      Marland Kitchen atorvastatin (LIPITOR) 40 MG tablet Take 1 tablet (40 mg total) by mouth daily. 90 tablet 3  . diltiazem (DILACOR XR) 180 MG 24 hr capsule Take 1 capsule (180 mg total) by mouth daily. 90 capsule 3  . fluticasone (FLONASE) 50 MCG/ACT nasal spray Place 1 spray into both nostrils daily as needed for allergies or rhinitis. Reported on 07/11/2015    . glucose blood (ONE TOUCH ULTRA TEST) test strip 1 each by Other route 3 (three) times daily. And lancets 3/day 300 each 3  . Insulin Pen Needle (BD ULTRA-FINE PEN NEEDLES) 29G X 12.7MM MISC USE 5 TIMES A DAY AS DIRECTED 500 each 2  . Insulin Syringe-Needle U-100 (INSULIN SYRINGE 1CC/31GX5/16") 31G X 5/16" 1 ML MISC Use to inject insulin twice per day. 200 each 0  . lisinopril-hydrochlorothiazide (PRINZIDE,ZESTORETIC) 20-12.5 MG tablet Take 2 tablets by mouth  daily 180 tablet 3  . Multiple Vitamin (MULTIVITAMIN WITH MINERALS)  TABS tablet Take 1 tablet by mouth daily.    Marland Kitchen PRESCRIPTION MEDICATION Reported on 07/11/2015    . zinc gluconate 50 MG tablet Take 50 mg by mouth every other day.      No current facility-administered medications on file prior to visit.     No Known Allergies  Family History  Problem Relation Age of Onset  . Breast cancer Mother   . Heart disease Mother   . Cancer Mother     Breast  . Coronary artery disease Father     starting in his 68s. A bypass and mitral infarction  . Heart disease Father     Coronary Disease, bypass and mitral infarction  . Colon cancer Paternal Grandmother     BP 132/80   Pulse 95   Ht 5\' 6"  (1.676 m)   Wt 257 lb (116.6 kg)   SpO2 95%   BMI 41.48 kg/m  REVIEW OF SYSTEMS: PHYSICAL EXAMINATION: VITAL SIGNS:  See vs page GENERAL: no distress Pulses: dorsalis pedis intact bilat.   MSK: no deformity of the feet CV: no leg edema Skin:  no ulcer on the feet.  normal color and temp on the feet. Neuro: sensation is intact to touch on the feet. LAB/XRAY RESULTS: UA: few WBC Lab Results  Component Value Date   CHOL 189 01/15/2016   HDL 41.40 01/15/2016   LDLCALC 88 11/01/2014   LDLDIRECT 112.0 01/15/2016   TRIG 309.0 (H) 01/15/2016   CHOLHDL 5 01/15/2016   Lab Results  Component Value Date   HGBA1C 8.0 01/15/2016  IMPRESSION: Dyslipidemia: worse Pyuria, uncertain etiology Insulin-requiring type 2 DM: Based on the pattern of her cbg's, she needs some adjustment in her therapy.  PLAN: Pt advised: If you  have no symptoms, it needs no treatment. If you've symptoms, please let me know. Also, your cholesterol is high. Do you ever miss the lipitor?  increase the humalog to 180 units 3 times a day (just before each meal), and:  Decrease the lantus to 100 units daily

## 2016-01-13 ENCOUNTER — Other Ambulatory Visit: Payer: Self-pay

## 2016-01-13 MED ORDER — INSULIN PEN NEEDLE 29G X 12.7MM MISC: 2 refills | Status: DC

## 2016-01-13 MED ORDER — INSULIN GLARGINE 100 UNIT/ML SOLOSTAR PEN: 130.0000 [IU] | PEN_INJECTOR | Freq: Every day | SUBCUTANEOUS | 0 refills | Status: DC

## 2016-01-15 ENCOUNTER — Ambulatory Visit (INDEPENDENT_AMBULATORY_CARE_PROVIDER_SITE_OTHER): Payer: 59 | Admitting: Endocrinology

## 2016-01-15 ENCOUNTER — Encounter: Payer: Self-pay | Admitting: Endocrinology

## 2016-01-15 VITALS — BP 132/80 | HR 95 | Ht 66.0 in | Wt 257.0 lb

## 2016-01-15 DIAGNOSIS — Z0000 Encounter for general adult medical examination without abnormal findings: Secondary | ICD-10-CM

## 2016-01-15 DIAGNOSIS — E119 Type 2 diabetes mellitus without complications: Secondary | ICD-10-CM

## 2016-01-15 DIAGNOSIS — Z794 Long term (current) use of insulin: Secondary | ICD-10-CM

## 2016-01-15 DIAGNOSIS — Z23 Encounter for immunization: Secondary | ICD-10-CM | POA: Diagnosis not present

## 2016-01-15 LAB — URINALYSIS, ROUTINE W REFLEX MICROSCOPIC
Bilirubin Urine: NEGATIVE
HGB URINE DIPSTICK: NEGATIVE
Leukocytes, UA: NEGATIVE
Nitrite: NEGATIVE
Specific Gravity, Urine: 1.015 (ref 1.000–1.030)
Total Protein, Urine: NEGATIVE
Urine Glucose: 500 — AB
Urobilinogen, UA: 0.2 (ref 0.0–1.0)
pH: 7 (ref 5.0–8.0)

## 2016-01-15 LAB — HEPATIC FUNCTION PANEL
ALT: 20 U/L (ref 0–35)
AST: 17 U/L (ref 0–37)
Albumin: 4.3 g/dL (ref 3.5–5.2)
Alkaline Phosphatase: 73 U/L (ref 39–117)
BILIRUBIN TOTAL: 0.4 mg/dL (ref 0.2–1.2)
Bilirubin, Direct: 0.1 mg/dL (ref 0.0–0.3)
Total Protein: 7.2 g/dL (ref 6.0–8.3)

## 2016-01-15 LAB — POCT GLYCOSYLATED HEMOGLOBIN (HGB A1C): Hemoglobin A1C: 8

## 2016-01-15 LAB — LIPID PANEL
CHOLESTEROL: 189 mg/dL (ref 0–200)
HDL: 41.4 mg/dL (ref >39.00)
NonHDL: 147.79
Total CHOL/HDL Ratio: 5
Triglycerides: 309 mg/dL — ABNORMAL HIGH (ref 0.0–149.0)
VLDL: 61.8 mg/dL — AB (ref 0.0–40.0)

## 2016-01-15 LAB — CBC WITH DIFFERENTIAL/PLATELET
BASOS PCT: 0.5 % (ref 0.0–3.0)
Basophils Absolute: 0.1 10*3/uL (ref 0.0–0.1)
EOS PCT: 1.6 % (ref 0.0–5.0)
Eosinophils Absolute: 0.2 10*3/uL (ref 0.0–0.7)
HEMATOCRIT: 44 % (ref 36.0–46.0)
HEMOGLOBIN: 15.1 g/dL — AB (ref 12.0–15.0)
LYMPHS PCT: 27.6 % (ref 12.0–46.0)
Lymphs Abs: 3.3 10*3/uL (ref 0.7–4.0)
MCHC: 34.2 g/dL (ref 30.0–36.0)
MCV: 90.2 fl (ref 78.0–100.0)
MONO ABS: 1 10*3/uL (ref 0.1–1.0)
Monocytes Relative: 8.3 % (ref 3.0–12.0)
Neutro Abs: 7.4 10*3/uL (ref 1.4–7.7)
Neutrophils Relative %: 62 % (ref 43.0–77.0)
Platelets: 373 10*3/uL (ref 150.0–400.0)
RBC: 4.88 Mil/uL (ref 3.87–5.11)
RDW: 14.2 % (ref 11.5–15.5)
WBC: 12 10*3/uL — AB (ref 4.0–10.5)

## 2016-01-15 LAB — BASIC METABOLIC PANEL
BUN: 19 mg/dL (ref 6–23)
CHLORIDE: 102 meq/L (ref 96–112)
CO2: 29 meq/L (ref 19–32)
Calcium: 9.9 mg/dL (ref 8.4–10.5)
Creatinine, Ser: 0.84 mg/dL (ref 0.40–1.20)
GFR: 74.82 mL/min (ref >60.00)
Glucose, Bld: 238 mg/dL — ABNORMAL HIGH (ref 70–99)
POTASSIUM: 3.9 meq/L (ref 3.5–5.1)
SODIUM: 139 meq/L (ref 135–145)

## 2016-01-15 LAB — HEPATITIS C ANTIBODY: HCV Ab: NEGATIVE

## 2016-01-15 LAB — MICROALBUMIN / CREATININE URINE RATIO
Creatinine,U: 99.8 mg/dL
MICROALB/CREAT RATIO: 2 mg/g (ref 0.0–30.0)
Microalb, Ur: 2 mg/dL — ABNORMAL HIGH (ref 0.0–1.9)

## 2016-01-15 LAB — TSH: TSH: 1.65 u[IU]/mL (ref 0.35–4.50)

## 2016-01-15 LAB — LDL CHOLESTEROL, DIRECT: LDL DIRECT: 112 mg/dL

## 2016-01-15 MED ORDER — INSULIN LISPRO 100 UNIT/ML (KWIKPEN): 180.0000 [IU] | PEN_INJECTOR | Freq: Three times a day (TID) | SUBCUTANEOUS | 3 refills | Status: DC

## 2016-01-15 MED ORDER — INSULIN GLARGINE 100 UNIT/ML SOLOSTAR PEN: 100.0000 [IU] | PEN_INJECTOR | Freq: Every day | SUBCUTANEOUS | 11 refills | Status: DC

## 2016-01-15 NOTE — Patient Instructions (Addendum)
check your blood sugar 3 times a day.  vary the time of day when you check, between before the 3 meals, and at bedtime.  also check if you have symptoms of your blood sugar being too high or too low.  please keep a record of the readings and bring it to your next appointment here (or you can bring the meter itself).  You can write it on any piece of paper.  please call us sooner if your blood sugar goes below 70, or if you have a lot of readings over 200.  Please increase the humalog to 180 units 3 times a day (just before each meal),  and:  Decrease the lantus to 100 units daily.   blood tests are requested for you today.  We'll let you know about the results. Please consider these measures for your health:  minimize alcohol.  Do not use tobacco products.  Have a colonoscopy at least every 10 years from age 49.  Women should have an annual mammogram from age 7.  Keep firearms safely stored.  Always use seat belts.  have working smoke alarms in your home.  See an eye doctor and dentist regularly.  Never drive under the influence of alcohol or drugs (including prescription drugs).  Those with fair skin should take precautions against the sun, and should carefully examine their skin once per month, for any new or changed moles.  Please come back for a follow-up appointment in 2 months.

## 2016-01-16 LAB — HIV ANTIBODY (ROUTINE TESTING W REFLEX): HIV 1&2 Ab, 4th Generation: NONREACTIVE

## 2016-02-05 ENCOUNTER — Encounter: Payer: Self-pay | Admitting: Endocrinology

## 2016-02-06 ENCOUNTER — Other Ambulatory Visit: Payer: Self-pay | Admitting: Endocrinology

## 2016-02-06 MED ORDER — BASAGLAR KWIKPEN 100 UNIT/ML ~~LOC~~ SOPN: 100.0000 [IU] | PEN_INJECTOR | Freq: Every day | SUBCUTANEOUS | 11 refills | Status: DC

## 2016-02-17 DIAGNOSIS — H40013 Open angle with borderline findings, low risk, bilateral: Secondary | ICD-10-CM | POA: Diagnosis not present

## 2016-02-17 DIAGNOSIS — H40053 Ocular hypertension, bilateral: Secondary | ICD-10-CM | POA: Diagnosis not present

## 2016-03-12 IMAGING — CR DG HAND COMPLETE 3+V*R*
3 series · 3 of 3 positions shown · non-contrast
Comparison: 05/09/2013

CLINICAL DATA: Right hand pain, no known injury, initial encounter

EXAM:
RIGHT HAND - COMPLETE 3+ VIEW

[view not recorded (1 of 3)]
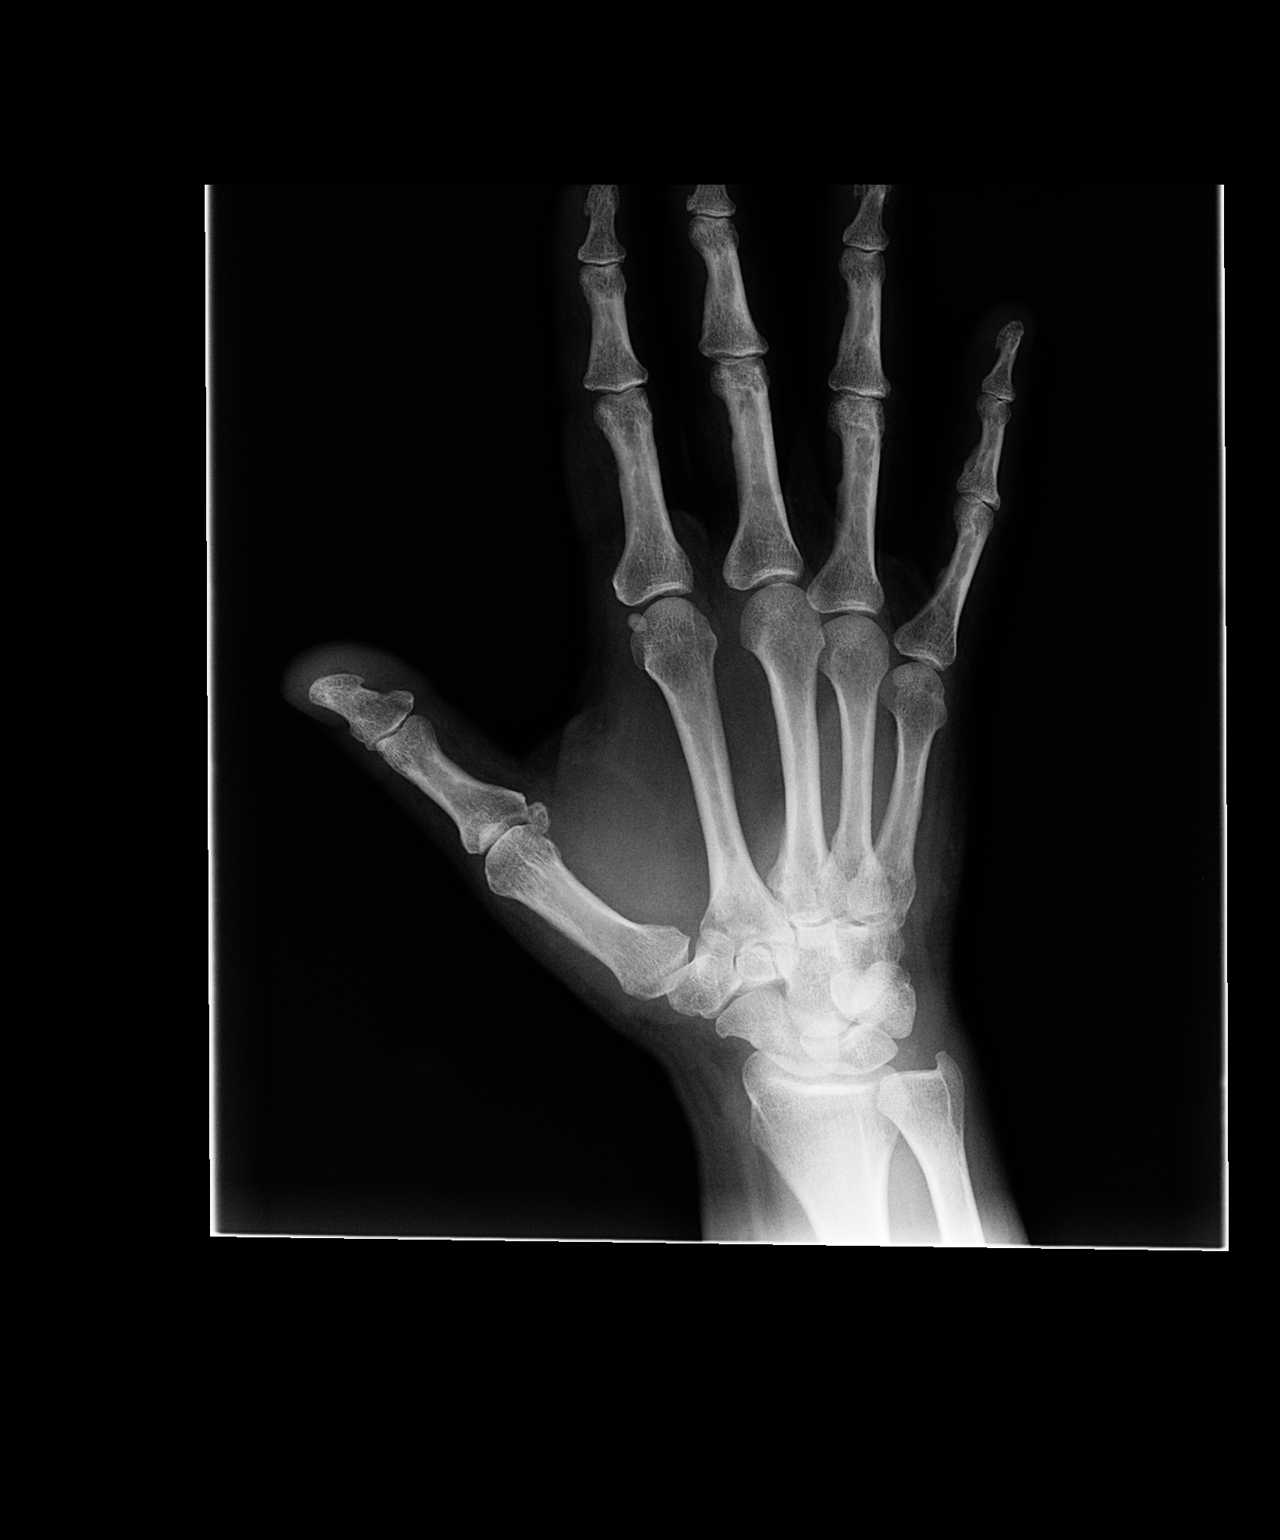

[view not recorded (2 of 3)]
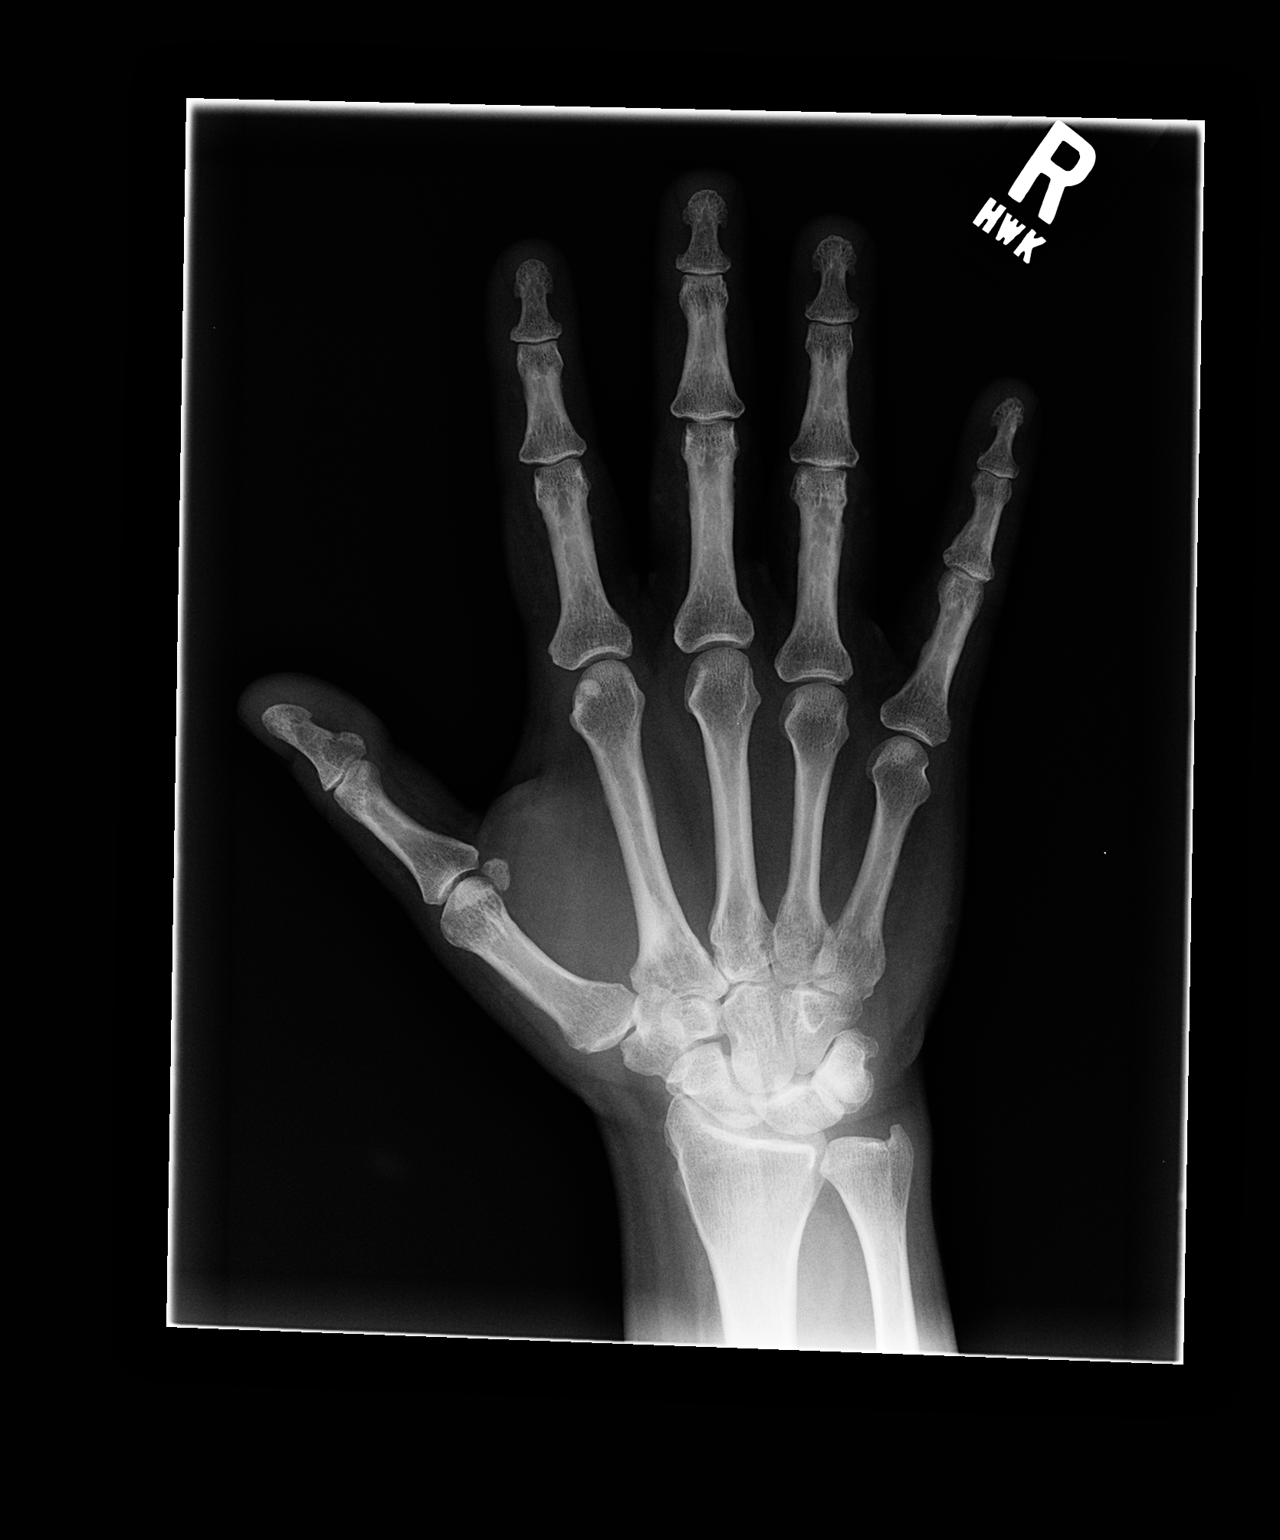

[view not recorded (3 of 3)]
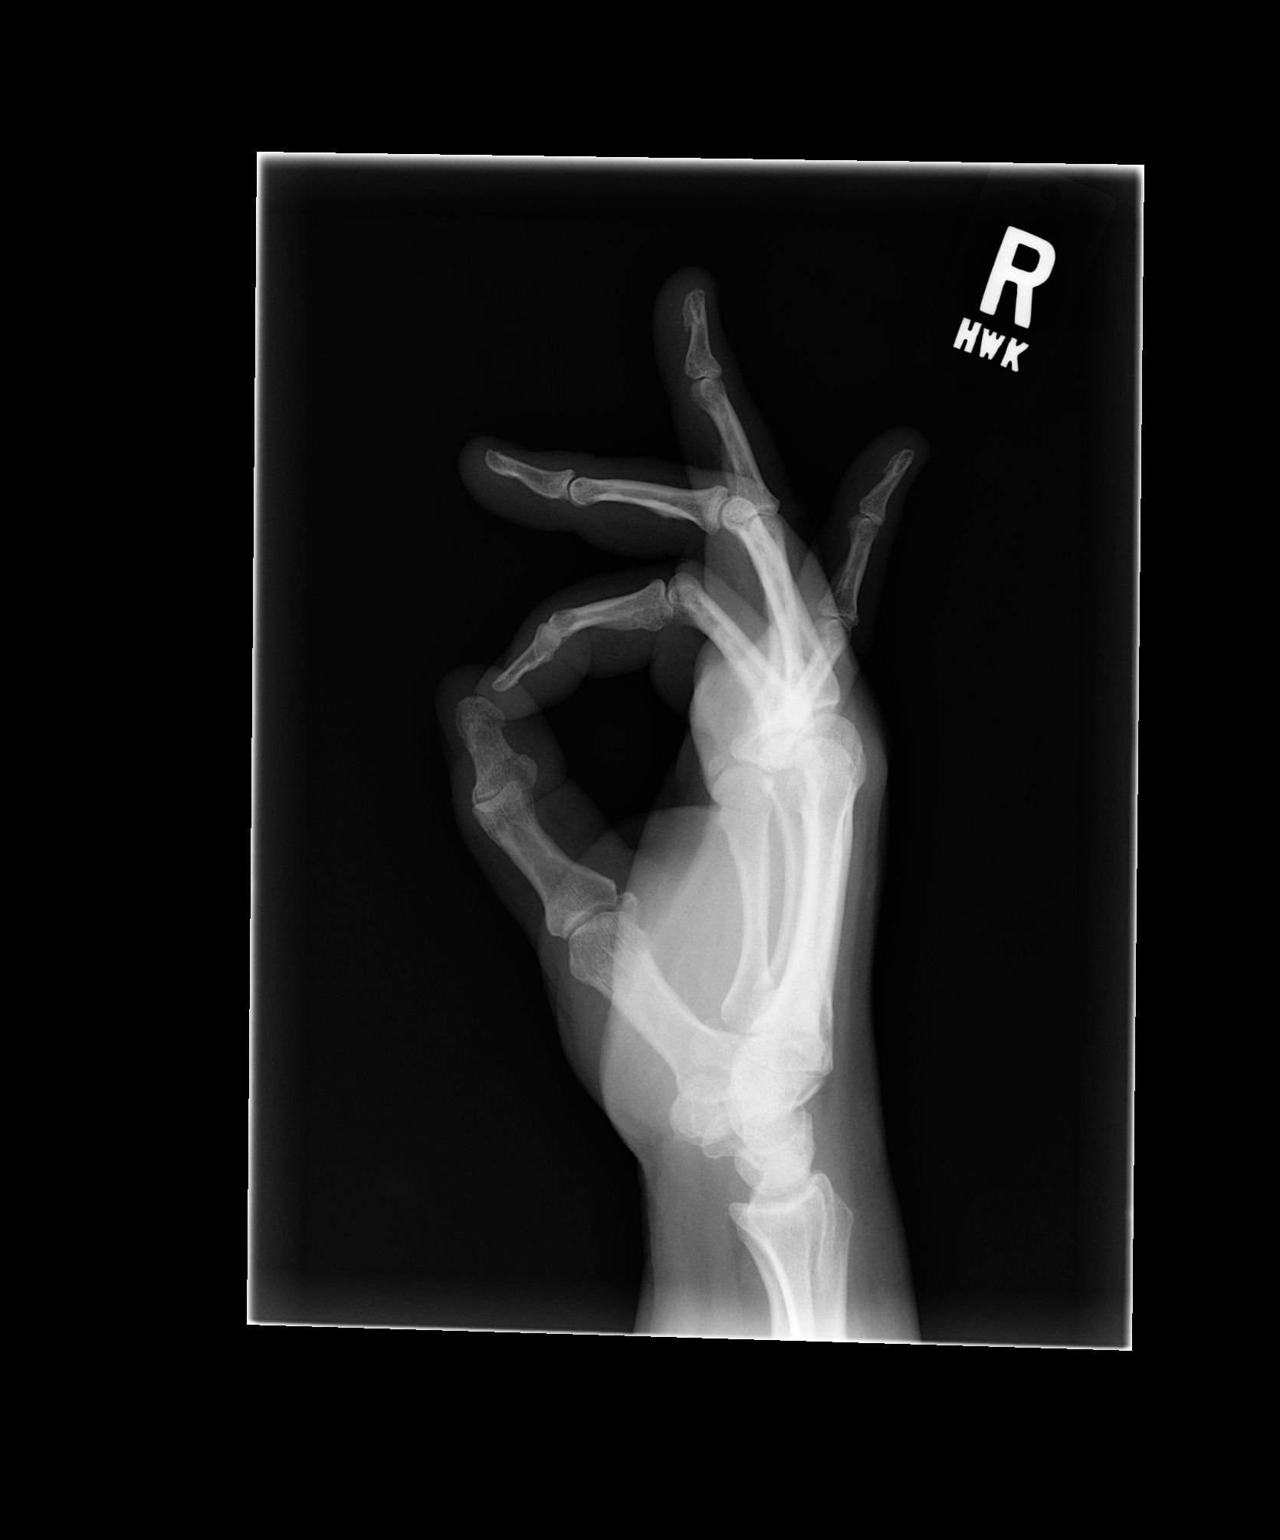

[3 of 3 positions shown; findings below may reference images not displayed]

FINDINGS: Three views of the right hand submitted. No acute fracture or
subluxation. No radiopaque foreign body.
IMPRESSION: Negative.

## 2016-03-17 ENCOUNTER — Encounter: Payer: Self-pay | Admitting: Endocrinology

## 2016-03-17 ENCOUNTER — Ambulatory Visit (INDEPENDENT_AMBULATORY_CARE_PROVIDER_SITE_OTHER): Payer: 59 | Admitting: Endocrinology

## 2016-03-17 VITALS — BP 136/86 | HR 105 | Ht 66.0 in | Wt 257.0 lb

## 2016-03-17 DIAGNOSIS — Z794 Long term (current) use of insulin: Secondary | ICD-10-CM | POA: Diagnosis not present

## 2016-03-17 DIAGNOSIS — E119 Type 2 diabetes mellitus without complications: Secondary | ICD-10-CM

## 2016-03-17 LAB — POCT GLYCOSYLATED HEMOGLOBIN (HGB A1C): Hemoglobin A1C: 8.4

## 2016-03-17 MED ORDER — INSULIN LISPRO 100 UNIT/ML (KWIKPEN): 200.0000 [IU] | PEN_INJECTOR | Freq: Three times a day (TID) | SUBCUTANEOUS | 3 refills | Status: DC

## 2016-03-17 NOTE — Patient Instructions (Addendum)
check your blood sugar 3 times a day.  vary the time of day when you check, between before the 3 meals, and at bedtime.  also check if you have symptoms of your blood sugar being too high or too low.  please keep a record of the readings and bring it to your next appointment here (or you can bring the meter itself).  You can write it on any piece of paper.  please call us sooner if your blood sugar goes below 70, or if you have a lot of readings over 200.  Please increase the humalog to 200 units 3 times a day (just before each meal),  and:  Please continue the same lantus. Please come back for a follow-up appointment in 2 months.

## 2016-03-17 NOTE — Progress Notes (Signed)
Subjective:    Patient ID: Catherine Moon, female    DOB: 04-10-1960, 56 y.o.   MRN: [id number]  HPI Pt returns for f/u of diabetes mellitus: DM type: Insulin-requiring type 2 Dx'ed: 0272 Complications: none Therapy: insulin since 2010.  GDM: never  DKA: never Severe hypoglycemia: never Pancreatitis: never Other: DM is characterized by severe insulin resistance; she takes multiple daily injections; she works 3rd shift.   Interval history: no cbg record, but states cbg's vary from 90-200's.  It is still in general higher soon after a meal, than if she has not recently eaten.   Past Medical History:  Diagnosis Date  . CTS (carpal tunnel syndrome)   . DM (diabetes mellitus) ([place])   . Dyslipidemia   . HTN (hypertension)   . Hypercholesterolemia   . Smoker   . Warts, genital     Past Surgical History:  Procedure Laterality Date  . COLONOSCOPY    . DILATION AND CURETTAGE OF UTERUS    . WISDOM TOOTH EXTRACTION      Social History   Social History  . Marital status: Married    Spouse name: N/A  . Number of children: N/A  . Years of education: N/A   Occupational History  . Not on file.   Social History Main Topics  . Smoking status: Former Smoker    Packs/day: 1.00    Years: 26.00    Types: Cigarettes    Quit date: 08/13/2007  . Smokeless tobacco: Never Used     Comment:  She smoked one pack per day for 26 years but quit in 2009.  Marland Kitchen Alcohol use No  . Drug use: No  . Sexual activity: Not on file   Other Topics Concern  . Not on file   Social History Narrative   Works 3rd shift in [employer], married.  She smoked one pack per day for 26 years but quit in 2009.          Current Outpatient Prescriptions on File Prior to Visit  Medication Sig Dispense Refill  . acetaminophen (TYLENOL) 500 MG tablet Take 1,000 mg by mouth every 6 (six) hours as needed for mild pain, moderate pain, fever or headache.    Marland Kitchen aspirin 81 MG EC tablet Take 81 mg by mouth  daily.      Marland Kitchen atorvastatin (LIPITOR) 40 MG tablet Take 1 tablet (40 mg total) by mouth daily. 90 tablet 3  . diltiazem (DILACOR XR) 180 MG 24 hr capsule Take 1 capsule (180 mg total) by mouth daily. 90 capsule 3  . fluticasone (FLONASE) 50 MCG/ACT nasal spray Place 1 spray into both nostrils daily as needed for allergies or rhinitis. Reported on 07/11/2015    . glucose blood (ONE TOUCH ULTRA TEST) test strip 1 each by Other route 3 (three) times daily. And lancets 3/day 300 each 3  . Insulin Glargine (BASAGLAR KWIKPEN) 100 UNIT/ML SOPN Inject 1 mL (100 Units total) into the skin at bedtime. 15 pen 11  . Insulin Pen Needle (BD ULTRA-FINE PEN NEEDLES) 29G X 12.7MM MISC USE 5 TIMES A DAY AS DIRECTED 500 each 2  . Insulin Syringe-Needle U-100 (INSULIN SYRINGE 1CC/31GX5/16") 31G X 5/16" 1 ML MISC Use to inject insulin twice per day. 200 each 0  . lisinopril-hydrochlorothiazide (PRINZIDE,ZESTORETIC) 20-12.5 MG tablet Take 2 tablets by mouth  daily 180 tablet 3  . Multiple Vitamin (MULTIVITAMIN WITH MINERALS) TABS tablet Take 1 tablet by mouth daily.    Marland Kitchen PRESCRIPTION MEDICATION  Reported on 07/11/2015    . zinc gluconate 50 MG tablet Take 50 mg by mouth every other day.      No current facility-administered medications on file prior to visit.     No Known Allergies  Family History  Problem Relation Age of Onset  . Breast cancer Mother   . Heart disease Mother   . Cancer Mother     Breast  . Coronary artery disease Father     starting in his 66s. A bypass and mitral infarction  . Heart disease Father     Coronary Disease, bypass and mitral infarction  . Colon cancer Paternal Grandmother     BP 136/86   Pulse (!) 105   Ht 5\' 6"  (1.676 m)   Wt 257 lb (116.6 kg)   SpO2 94%   BMI 41.48 kg/m    Review of Systems She denies hypoglycemia    Objective:   Physical Exam VITAL SIGNS:  See vs page GENERAL: no distress Pulses: dorsalis pedis intact bilat.   MSK: no deformity of the feet CV:  no leg edema Skin:  no ulcer on the feet.  normal color and temp on the feet. Neuro: sensation is intact to touch on the feet.   A1c=8.4%    Assessment & Plan:  Insulin-requiring type 2 DM: she needs increased rx.   Patient is advised the following: Patient Instructions  check your blood sugar 3 times a day.  vary the time of day when you check, between before the 3 meals, and at bedtime.  also check if you have symptoms of your blood sugar being too high or too low.  please keep a record of the readings and bring it to your next appointment here (or you can bring the meter itself).  You can write it on any piece of paper.  please call us sooner if your blood sugar goes below 70, or if you have a lot of readings over 200.  Please increase the humalog to 200 units 3 times a day (just before each meal),  and:  Please continue the same lantus. Please come back for a follow-up appointment in 2 months.

## 2016-03-31 ENCOUNTER — Encounter: Payer: Self-pay | Admitting: Endocrinology

## 2016-03-31 ENCOUNTER — Other Ambulatory Visit: Payer: Self-pay

## 2016-03-31 MED ORDER — ATORVASTATIN CALCIUM 40 MG PO TABS: 40.0000 mg | ORAL_TABLET | Freq: Every day | ORAL | 3 refills | Status: DC

## 2016-03-31 MED ORDER — BASAGLAR KWIKPEN 100 UNIT/ML ~~LOC~~ SOPN: 100.0000 [IU] | PEN_INJECTOR | Freq: Every day | SUBCUTANEOUS | 11 refills | Status: DC

## 2016-03-31 MED ORDER — "INSULIN SYRINGE 31G X 5/16"" 1 ML MISC": 0 refills | Status: DC

## 2016-04-06 ENCOUNTER — Other Ambulatory Visit: Payer: Self-pay

## 2016-04-06 MED ORDER — INSULIN PEN NEEDLE 29G X 12.7MM MISC: 2 refills | Status: DC

## 2016-04-20 DIAGNOSIS — E119 Type 2 diabetes mellitus without complications: Secondary | ICD-10-CM | POA: Diagnosis not present

## 2016-05-19 ENCOUNTER — Encounter: Payer: Self-pay | Admitting: Endocrinology

## 2016-05-19 ENCOUNTER — Ambulatory Visit (INDEPENDENT_AMBULATORY_CARE_PROVIDER_SITE_OTHER): Payer: 59 | Admitting: Endocrinology

## 2016-05-19 VITALS — BP 138/78 | HR 91 | Ht 66.0 in | Wt 250.0 lb

## 2016-05-19 DIAGNOSIS — Z794 Long term (current) use of insulin: Secondary | ICD-10-CM | POA: Diagnosis not present

## 2016-05-19 DIAGNOSIS — E119 Type 2 diabetes mellitus without complications: Secondary | ICD-10-CM

## 2016-05-19 LAB — POCT GLYCOSYLATED HEMOGLOBIN (HGB A1C): Hemoglobin A1C: 7.8

## 2016-05-19 MED ORDER — BASAGLAR KWIKPEN 100 UNIT/ML ~~LOC~~ SOPN: 120.0000 [IU] | PEN_INJECTOR | Freq: Every day | SUBCUTANEOUS | 11 refills | Status: DC

## 2016-05-19 NOTE — Progress Notes (Signed)
Subjective:    Patient ID: Catherine Moon, female    DOB: 05/29/1960, 56 y.o.   MRN: [id number]  HPI Pt returns for f/u of diabetes mellitus: DM type: Insulin-requiring type 2 Dx'ed: 3710 Complications: none.  Therapy: insulin since 2010.  GDM: never  DKA: never Severe hypoglycemia: never Pancreatitis: never Other: DM is characterized by severe insulin resistance; she takes multiple daily injections; she works 3rd shift.   Interval history: no cbg record, but states cbg's are in the low to mid-100's.  It is still in general higher if she has not recently eaten, rather than after a meal.  Past Medical History:  Diagnosis Date  . CTS (carpal tunnel syndrome)   . DM (diabetes mellitus) ([place])   . Dyslipidemia   . HTN (hypertension)   . Hypercholesterolemia   . Smoker   . Warts, genital     Past Surgical History:  Procedure Laterality Date  . COLONOSCOPY    . DILATION AND CURETTAGE OF UTERUS    . WISDOM TOOTH EXTRACTION      Social History   Social History  . Marital status: Married    Spouse name: N/A  . Number of children: N/A  . Years of education: N/A   Occupational History  . Not on file.   Social History Main Topics  . Smoking status: Former Smoker    Packs/day: 1.00    Years: 26.00    Types: Cigarettes    Quit date: 08/13/2007  . Smokeless tobacco: Never Used     Comment:  She smoked one pack per day for 26 years but quit in 2009.  Marland Kitchen Alcohol use No  . Drug use: No  . Sexual activity: Not on file   Other Topics Concern  . Not on file   Social History Narrative   Works 3rd shift in [employer], married.  She smoked one pack per day for 26 years but quit in 2009.          Current Outpatient Prescriptions on File Prior to Visit  Medication Sig Dispense Refill  . acetaminophen (TYLENOL) 500 MG tablet Take 1,000 mg by mouth every 6 (six) hours as needed for mild pain, moderate pain, fever or headache.    Marland Kitchen aspirin 81 MG EC tablet Take 81  mg by mouth daily.      Marland Kitchen atorvastatin (LIPITOR) 40 MG tablet Take 1 tablet (40 mg total) by mouth daily. 90 tablet 3  . diltiazem (DILACOR XR) 180 MG 24 hr capsule Take 1 capsule (180 mg total) by mouth daily. 90 capsule 3  . fluticasone (FLONASE) 50 MCG/ACT nasal spray Place 1 spray into both nostrils daily as needed for allergies or rhinitis. Reported on 07/11/2015    . glucose blood (ONE TOUCH ULTRA TEST) test strip 1 each by Other route 3 (three) times daily. And lancets 3/day 300 each 3  . insulin lispro (HUMALOG KWIKPEN) 100 UNIT/ML KiwkPen Inject 2 mLs (200 Units total) into the skin 3 (three) times daily with meals. 480 mL 3  . Insulin Pen Needle (BD ULTRA-FINE PEN NEEDLES) 29G X 12.7MM MISC USE 5 TIMES A DAY AS DIRECTED 500 each 2  . Insulin Syringe-Needle U-100 (INSULIN SYRINGE 1CC/31GX5/16") 31G X 5/16" 1 ML MISC Use to inject insulin twice per day. 200 each 0  . lisinopril-hydrochlorothiazide (PRINZIDE,ZESTORETIC) 20-12.5 MG tablet Take 2 tablets by mouth  daily 180 tablet 3  . Multiple Vitamin (MULTIVITAMIN WITH MINERALS) TABS tablet Take 1 tablet by mouth  daily.    Marland Kitchen PRESCRIPTION MEDICATION Reported on 07/11/2015    . zinc gluconate 50 MG tablet Take 50 mg by mouth every other day.      No current facility-administered medications on file prior to visit.     No Known Allergies  Family History  Problem Relation Age of Onset  . Breast cancer Mother   . Heart disease Mother   . Cancer Mother        Breast  . Coronary artery disease Father        starting in his 57s. A bypass and mitral infarction  . Heart disease Father        Coronary Disease, bypass and mitral infarction  . Colon cancer Paternal Grandmother     BP 138/78   Pulse 91   Ht 5\' 6"  (1.676 m)   Wt 250 lb (113.4 kg)   SpO2 94%   BMI 40.35 kg/m    Review of Systems She denies hypoglycemia.     Objective:   Physical Exam VITAL SIGNS:  See vs page GENERAL: no distress Pulses: dorsalis pedis intact bilat.    MSK: no deformity of the feet CV: no leg edema Skin:  no ulcer on the feet.  normal color and temp on the feet. Neuro: sensation is intact to touch on the feet  Lab Results  Component Value Date   HGBA1C 7.8 05/19/2016      Assessment & Plan:  Insulin-requiring type 2 DM: she needs increased rx.   Patient Instructions  check your blood sugar 3 times a day.  vary the time of day when you check, between before the 3 meals, and at bedtime.  also check if you have symptoms of your blood sugar being too high or too low.  please keep a record of the readings and bring it to your next appointment here (or you can bring the meter itself).  You can write it on any piece of paper.  please call us sooner if your blood sugar goes below 70, or if you have a lot of readings over 200.  Please increase the lantus to 120 units daily, and:  Please continue the same humalog.  Please come back for a follow-up appointment in 4 months.

## 2016-05-19 NOTE — Patient Instructions (Addendum)
check your blood sugar 3 times a day.  vary the time of day when you check, between before the 3 meals, and at bedtime.  also check if you have symptoms of your blood sugar being too high or too low.  please keep a record of the readings and bring it to your next appointment here (or you can bring the meter itself).  You can write it on any piece of paper.  please call us sooner if your blood sugar goes below 70, or if you have a lot of readings over 200.  Please increase the lantus to 120 units daily, and:  Please continue the same humalog.  Please come back for a follow-up appointment in 4 months.

## 2016-05-26 ENCOUNTER — Telehealth: Payer: Self-pay | Admitting: Endocrinology

## 2016-05-26 NOTE — Telephone Encounter (Signed)
FYI: [facility] was sent a rx for Basaglar by [place] they distributed the wrong amount, they distributed only 15 mL and not the 15 pens as asked  They have fixed this and wanted you to know
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
## 2016-08-09 ENCOUNTER — Other Ambulatory Visit: Payer: Self-pay | Admitting: Endocrinology

## 2016-09-21 ENCOUNTER — Ambulatory Visit: Payer: 59 | Admitting: Endocrinology

## 2016-10-12 ENCOUNTER — Ambulatory Visit (INDEPENDENT_AMBULATORY_CARE_PROVIDER_SITE_OTHER): Payer: 59 | Admitting: Endocrinology

## 2016-10-12 ENCOUNTER — Encounter: Payer: Self-pay | Admitting: Endocrinology

## 2016-10-12 VITALS — BP 156/78 | HR 84 | Wt 242.8 lb

## 2016-10-12 DIAGNOSIS — Z23 Encounter for immunization: Secondary | ICD-10-CM

## 2016-10-12 DIAGNOSIS — E119 Type 2 diabetes mellitus without complications: Secondary | ICD-10-CM

## 2016-10-12 DIAGNOSIS — Z794 Long term (current) use of insulin: Secondary | ICD-10-CM | POA: Diagnosis not present

## 2016-10-12 LAB — POCT GLYCOSYLATED HEMOGLOBIN (HGB A1C): HEMOGLOBIN A1C: 9.4

## 2016-10-12 NOTE — Progress Notes (Signed)
Subjective:    Patient ID: Catherine Moon, female    DOB: February 20, 1960, 56 y.o.   MRN: [id number]  HPI Pt returns for f/u of diabetes mellitus: DM type: Insulin-requiring type 2 Dx'ed: 6578 Complications: none.  Therapy: insulin since 2010.  GDM: never  DKA: never Severe hypoglycemia: never Pancreatitis: never Other: DM is characterized by severe insulin resistance; she takes multiple daily injections; she still works 3rd shift.   Interval history: Pt says she cannot afford basaglar, even with discount card.  She says she has therefore been missing the insulin.  cbg's are high.   Past Medical History:  Diagnosis Date  . CTS (carpal tunnel syndrome)   . DM (diabetes mellitus) ([place])   . Dyslipidemia   . HTN (hypertension)   . Hypercholesterolemia   . Smoker   . Warts, genital     Past Surgical History:  Procedure Laterality Date  . COLONOSCOPY    . DILATION AND CURETTAGE OF UTERUS    . WISDOM TOOTH EXTRACTION      Social History   Social History  . Marital status: Married    Spouse name: N/A  . Number of children: N/A  . Years of education: N/A   Occupational History  . Not on file.   Social History Main Topics  . Smoking status: Former Smoker    Packs/day: 1.00    Years: 26.00    Types: Cigarettes    Quit date: 08/13/2007  . Smokeless tobacco: Never Used     Comment:  She smoked one pack per day for 26 years but quit in 2009.  Marland Kitchen Alcohol use No  . Drug use: No  . Sexual activity: Not on file   Other Topics Concern  . Not on file   Social History Narrative   Works 3rd shift in [employer], married.  She smoked one pack per day for 26 years but quit in 2009.          Current Outpatient Prescriptions on File Prior to Visit  Medication Sig Dispense Refill  . acetaminophen (TYLENOL) 500 MG tablet Take 1,000 mg by mouth every 6 (six) hours as needed for mild pain, moderate pain, fever or headache.    Marland Kitchen aspirin 81 MG EC tablet Take 81 mg by mouth  daily.      Marland Kitchen atorvastatin (LIPITOR) 40 MG tablet Take 1 tablet (40 mg total) by mouth daily. 90 tablet 3  . diltiazem (DILACOR XR) 180 MG 24 hr capsule Take 1 capsule (180 mg total) by mouth daily. 90 capsule 3  . fluticasone (FLONASE) 50 MCG/ACT nasal spray Place 1 spray into both nostrils daily as needed for allergies or rhinitis. Reported on 07/11/2015    . glucose blood (ONE TOUCH ULTRA TEST) test strip 1 each by Other route 3 (three) times daily. And lancets 3/day 300 each 3  . Insulin Glargine (BASAGLAR KWIKPEN) 100 UNIT/ML SOPN Inject 1.2 mLs (120 Units total) into the skin at bedtime. 15 pen 11  . insulin lispro (HUMALOG KWIKPEN) 100 UNIT/ML KiwkPen Inject 2 mLs (200 Units total) into the skin 3 (three) times daily with meals. 480 mL 3  . Insulin Pen Needle (BD ULTRA-FINE PEN NEEDLES) 29G X 12.7MM MISC USE 5 TIMES A DAY AS DIRECTED 500 each 2  . lisinopril-hydrochlorothiazide (PRINZIDE,ZESTORETIC) 20-12.5 MG tablet TAKE 2 TABLETS BY MOUTH  DAILY 180 tablet 3  . Multiple Vitamin (MULTIVITAMIN WITH MINERALS) TABS tablet Take 1 tablet by mouth daily.    Marland Kitchen  PRESCRIPTION MEDICATION Reported on 07/11/2015    . zinc gluconate 50 MG tablet Take 50 mg by mouth every other day.      No current facility-administered medications on file prior to visit.     No Known Allergies  Family History  Problem Relation Age of Onset  . Breast cancer Mother   . Heart disease Mother   . Cancer Mother        Breast  . Coronary artery disease Father        starting in his 38s. A bypass and mitral infarction  . Heart disease Father        Coronary Disease, bypass and mitral infarction  . Colon cancer Paternal Grandmother     BP (!) 156/78   Pulse 84   Wt 242 lb 12.8 oz (110.1 kg)   SpO2 98%   BMI 39.19 kg/m    Review of Systems She has lost weight.      Objective:   Physical Exam VITAL SIGNS:  See vs page GENERAL: no distress Pulses: foot pulses are intact bilaterally.   MSK: no deformity of  the feet or ankles.  CV: no edema of the legs or ankles Skin:  no ulcer on the feet or ankles.  normal color and temp on the feet and ankles Neuro: sensation is intact to touch on the feet and ankles.     Lab Results  Component Value Date   HGBA1C 9.4 10/12/2016      Assessment & Plan:  Insulin-requiring type 2 DM: she needs increased rx.  therapy limited by pt's need for least expensive meds.    Patient Instructions  check your blood sugar 3 times a day.  vary the time of day when you check, between before the 3 meals, and at bedtime.  also check if you have symptoms of your blood sugar being too high or too low.  please keep a record of the readings and bring it to your next appointment here (or you can bring the meter itself).  You can write it on any piece of paper.  please call us sooner if your blood sugar goes below 70, or if you have a lot of readings over 200.  Please call your ins company, and find out which insulins are cheapest.  Then please let us know what they say.  Here is a discount card for "lantus," in case that is best.  If necessary, I can prescribe you [facility] insulin: $25 per bottle, less whatever your insurance.   Please come back for a follow-up appointment in 4 months.

## 2016-10-12 NOTE — Patient Instructions (Addendum)
check your blood sugar 3 times a day.  vary the time of day when you check, between before the 3 meals, and at bedtime.  also check if you have symptoms of your blood sugar being too high or too low.  please keep a record of the readings and bring it to your next appointment here (or you can bring the meter itself).  You can write it on any piece of paper.  please call us sooner if your blood sugar goes below 70, or if you have a lot of readings over 200.  Please call your ins company, and find out which insulins are cheapest.  Then please let us know what they say.  Here is a discount card for "lantus," in case that is best.  If necessary, I can prescribe you [facility] insulin: $25 per bottle, less whatever your insurance.   Please come back for a follow-up appointment in 4 months.

## 2016-11-18 ENCOUNTER — Other Ambulatory Visit: Payer: Self-pay | Admitting: Endocrinology

## 2016-12-01 DIAGNOSIS — Z1231 Encounter for screening mammogram for malignant neoplasm of breast: Secondary | ICD-10-CM | POA: Diagnosis not present

## 2016-12-01 DIAGNOSIS — Z01419 Encounter for gynecological examination (general) (routine) without abnormal findings: Secondary | ICD-10-CM | POA: Diagnosis not present

## 2016-12-21 DIAGNOSIS — H40053 Ocular hypertension, bilateral: Secondary | ICD-10-CM | POA: Diagnosis not present

## 2016-12-21 DIAGNOSIS — H40013 Open angle with borderline findings, low risk, bilateral: Secondary | ICD-10-CM | POA: Diagnosis not present

## 2017-02-03 DIAGNOSIS — H10411 Chronic giant papillary conjunctivitis, right eye: Secondary | ICD-10-CM | POA: Diagnosis not present

## 2017-02-08 DIAGNOSIS — H10411 Chronic giant papillary conjunctivitis, right eye: Secondary | ICD-10-CM | POA: Diagnosis not present

## 2017-02-09 ENCOUNTER — Ambulatory Visit (INDEPENDENT_AMBULATORY_CARE_PROVIDER_SITE_OTHER): Payer: 59 | Admitting: Endocrinology

## 2017-02-09 ENCOUNTER — Encounter: Payer: Self-pay | Admitting: Endocrinology

## 2017-02-09 VITALS — BP 127/72 | Ht 66.0 in | Wt 204.0 lb

## 2017-02-09 DIAGNOSIS — Z794 Long term (current) use of insulin: Secondary | ICD-10-CM | POA: Diagnosis not present

## 2017-02-09 DIAGNOSIS — E119 Type 2 diabetes mellitus without complications: Secondary | ICD-10-CM | POA: Diagnosis not present

## 2017-02-09 LAB — POCT GLYCOSYLATED HEMOGLOBIN (HGB A1C): Hemoglobin A1C: 14

## 2017-02-09 NOTE — Patient Instructions (Addendum)
check your blood sugar 3 times a day.  vary the time of day when you check, between before the 3 meals, and at bedtime.  also check if you have symptoms of your blood sugar being too high or too low.  please keep a record of the readings and bring it to your next appointment here (or you can bring the meter itself).  You can write it on any piece of paper.  please call us sooner if your blood sugar goes below 70, or if you have a lot of readings over 200.   If you are not going to take the insulin, it is critically important to drink plenty of fluids. Please stop taking the diltiazem.   Please come back for a regular physical appointment in 2 months.

## 2017-02-09 NOTE — Progress Notes (Signed)
Subjective:    Patient ID: Catherine Moon, female    DOB: [birth year], 57 y.o.   MRN: [id number]  HPI Pt returns for f/u of diabetes mellitus: DM type: Insulin-requiring type 2 Dx'ed: 6270 Complications: none.  Therapy: insulin since 2010.  GDM: never  DKA: never Severe hypoglycemia: never Pancreatitis: never Other: DM is characterized by severe insulin resistance; she takes multiple daily injections; she still works 3rd shift.   Interval history: She stopped insulin, due to cost.  She has polyuria and polydipsia.  However, pt says she otherwise feels well. Past Medical History:  Diagnosis Date  . CTS (carpal tunnel syndrome)   . DM (diabetes mellitus) ([facility])   . Dyslipidemia   . HTN (hypertension)   . Hypercholesterolemia   . Smoker   . Warts, genital     Past Surgical History:  Procedure Laterality Date  . COLONOSCOPY    . DILATION AND CURETTAGE OF UTERUS    . WISDOM TOOTH EXTRACTION      Social History   Socioeconomic History  . Marital status: Married    Spouse name: Not on file  . Number of children: Not on file  . Years of education: Not on file  . Highest education level: Not on file  Social Needs  . Financial resource strain: Not on file  . Food insecurity - worry: Not on file  . Food insecurity - inability: Not on file  . Transportation needs - medical: Not on file  . Transportation needs - non-medical: Not on file  Occupational History  . Not on file  Tobacco Use  . Smoking status: Former Smoker    Packs/day: 1.00    Years: 26.00    Pack years: 26.00    Types: Cigarettes    Last attempt to quit: 08/13/2007    Years since quitting: 9.5  . Smokeless tobacco: Never Used  . Tobacco comment:  She smoked one pack per day for 26 years but quit in 2009.  Substance and Sexual Activity  . Alcohol use: No    Alcohol/week: 0.0 oz  . Drug use: No  . Sexual activity: Not on file  Other Topics Concern  . Not on file  Social History Narrative   Works 3rd  shift in [employer], married.  She smoked one pack per day for 26 years but quit in 2009.          Current Outpatient Medications on File Prior to Visit  Medication Sig Dispense Refill  . acetaminophen (TYLENOL) 500 MG tablet Take 1,000 mg by mouth every 6 (six) hours as needed for mild pain, moderate pain, fever or headache.    Marland Kitchen aspirin 81 MG EC tablet Take 81 mg by mouth daily.      Marland Kitchen atorvastatin (LIPITOR) 40 MG tablet Take 1 tablet (40 mg total) by mouth daily. 90 tablet 3  . fluticasone (FLONASE) 50 MCG/ACT nasal spray Place 1 spray into both nostrils daily as needed for allergies or rhinitis. Reported on 07/11/2015    . glucose blood (ONE TOUCH ULTRA TEST) test strip 1 each by Other route 3 (three) times daily. And lancets 3/day 300 each 3  . Insulin Pen Needle (BD ULTRA-FINE PEN NEEDLES) 29G X 12.7MM MISC USE 5 TIMES A DAY AS DIRECTED 500 each 2  . lisinopril-hydrochlorothiazide (PRINZIDE,ZESTORETIC) 20-12.5 MG tablet TAKE 2 TABLETS BY MOUTH  DAILY 180 tablet 3  . Multiple Vitamin (MULTIVITAMIN WITH MINERALS) TABS tablet Take 1 tablet by mouth daily.    Marland Kitchen  zinc gluconate 50 MG tablet Take 50 mg by mouth every other day.      No current facility-administered medications on file prior to visit.     No Known Allergies  Family History  Problem Relation Age of Onset  . Breast cancer Mother   . Heart disease Mother   . Cancer Mother        Breast  . Coronary artery disease Father        starting in his 26s. A bypass and mitral infarction  . Heart disease Father        Coronary Disease, bypass and mitral infarction  . Colon cancer Paternal Grandmother     BP 127/72 (BP Location: Left Arm, Patient Position: Sitting, Cuff Size: Normal)   Ht 5\' 6"  (1.676 m)   Wt 204 lb (92.5 kg)   BMI 32.93 kg/m   Review of Systems She has lost 40 lbs.  Denies n/v.     Objective:   Physical Exam VITAL SIGNS:  See vs page GENERAL: no distress Pulses: dorsalis pedis intact bilat.    MSK: no deformity of the feet CV: no leg edema Skin:  no ulcer on the feet.  normal color and temp on the feet. Neuro: sensation is intact to touch on the feet.     Lab Results  Component Value Date   HGBA1C 14.0 02/09/2017       Assessment & Plan:  Type 2 DM: very poor glycemic control.   Noncompliance with insulin.  We discussed.  Although she is losing weight, I advised pt against this.  She wants to stay off insulin.   HTN: overcontrolled, due to weight loss.    Patient Instructions  check your blood sugar 3 times a day.  vary the time of day when you check, between before the 3 meals, and at bedtime.  also check if you have symptoms of your blood sugar being too high or too low.  please keep a record of the readings and bring it to your next appointment here (or you can bring the meter itself).  You can write it on any piece of paper.  please call us sooner if your blood sugar goes below 70, or if you have a lot of readings over 200.   If you are not going to take the insulin, it is critically important to drink plenty of fluids. Please stop taking the diltiazem.   Please come back for a regular physical appointment in 2 months.

## 2017-02-15 ENCOUNTER — Encounter: Payer: Self-pay | Admitting: Endocrinology

## 2017-03-01 ENCOUNTER — Encounter: Payer: Self-pay | Admitting: Endocrinology

## 2017-03-02 MED ORDER — INSULIN LISPRO 100 UNIT/ML (KWIKPEN): PEN_INJECTOR | SUBCUTANEOUS | 3 refills | Status: DC

## 2017-03-03 ENCOUNTER — Telehealth: Payer: Self-pay | Admitting: *Deleted

## 2017-03-03 NOTE — Telephone Encounter (Signed)
Humalog Kwikpen PA submitted via CoverMyMeds.

## 2017-04-12 ENCOUNTER — Ambulatory Visit: Payer: 59 | Admitting: Endocrinology

## 2017-04-26 DIAGNOSIS — H40013 Open angle with borderline findings, low risk, bilateral: Secondary | ICD-10-CM | POA: Diagnosis not present

## 2017-04-26 DIAGNOSIS — E113291 Type 2 diabetes mellitus with mild nonproliferative diabetic retinopathy without macular edema, right eye: Secondary | ICD-10-CM | POA: Diagnosis not present

## 2017-04-28 NOTE — Telephone Encounter (Signed)
HUMALOG KWIK INJ 100/ML is approved through 03/03/2018

## 2017-05-11 ENCOUNTER — Ambulatory Visit: Payer: 59 | Admitting: Endocrinology

## 2017-06-05 ENCOUNTER — Other Ambulatory Visit: Payer: Self-pay | Admitting: Endocrinology

## 2017-06-08 ENCOUNTER — Ambulatory Visit: Payer: 59 | Admitting: Endocrinology

## 2017-06-08 ENCOUNTER — Encounter: Payer: Self-pay | Admitting: Endocrinology

## 2017-06-08 ENCOUNTER — Other Ambulatory Visit: Payer: Self-pay | Admitting: Endocrinology

## 2017-06-08 VITALS — BP 124/64 | HR 100 | Wt 204.6 lb

## 2017-06-08 DIAGNOSIS — Z794 Long term (current) use of insulin: Secondary | ICD-10-CM

## 2017-06-08 DIAGNOSIS — E119 Type 2 diabetes mellitus without complications: Secondary | ICD-10-CM

## 2017-06-08 LAB — POCT GLYCOSYLATED HEMOGLOBIN (HGB A1C): HEMOGLOBIN A1C: 12.7 % — AB (ref 4.0–5.6)

## 2017-06-08 MED ORDER — INSULIN LISPRO 100 UNIT/ML (KWIKPEN): 50.0000 [IU] | PEN_INJECTOR | Freq: Three times a day (TID) | SUBCUTANEOUS | 11 refills | Status: DC

## 2017-06-08 NOTE — Progress Notes (Signed)
Subjective:    Patient ID: Catherine Moon, female    DOB: 12-May-1960, 57 y.o.   MRN: [id number]  HPI Pt returns for f/u of diabetes mellitus: DM type: Insulin-requiring type 2 Dx'ed: 8299 Complications: none.  Therapy: insulin since 2010.  GDM: never  DKA: never Severe hypoglycemia: never Pancreatitis: never Other: DM is characterized by severe insulin resistance; she takes multiple daily injections; she still works 3rd shift.   Interval history: She takes the basaglar, but not the humalog.  She says polyuria is less now.  She says cbg's are 300-400.   Past Medical History:  Diagnosis Date  . CTS (carpal tunnel syndrome)   . DM (diabetes mellitus) ([place])   . Dyslipidemia   . HTN (hypertension)   . Hypercholesterolemia   . Smoker   . Warts, genital     Past Surgical History:  Procedure Laterality Date  . COLONOSCOPY    . DILATION AND CURETTAGE OF UTERUS    . WISDOM TOOTH EXTRACTION      Social History   Socioeconomic History  . Marital status: Married    Spouse name: Not on file  . Number of children: Not on file  . Years of education: Not on file  . Highest education level: Not on file  Occupational History  . Not on file  Social Needs  . Financial resource strain: Not on file  . Food insecurity:    Worry: Not on file    Inability: Not on file  . Transportation needs:    Medical: Not on file    Non-medical: Not on file  Tobacco Use  . Smoking status: Former Smoker    Packs/day: 1.00    Years: 26.00    Pack years: 26.00    Types: Cigarettes    Last attempt to quit: 08/13/2007    Years since quitting: 9.8  . Smokeless tobacco: Never Used  . Tobacco comment:  She smoked one pack per day for 26 years but quit in 2009.  Substance and Sexual Activity  . Alcohol use: No    Alcohol/week: 0.0 oz  . Drug use: No  . Sexual activity: Not on file  Lifestyle  . Physical activity:    Days per week: Not on file    Minutes per session: Not on file  . Stress: Not  on file  Relationships  . Social connections:    Talks on phone: Not on file    Gets together: Not on file    Attends religious service: Not on file    Active member of club or organization: Not on file    Attends meetings of clubs or organizations: Not on file    Relationship status: Not on file  . Intimate partner violence:    Fear of current or ex partner: Not on file    Emotionally abused: Not on file    Physically abused: Not on file    Forced sexual activity: Not on file  Other Topics Concern  . Not on file  Social History Narrative   Works 3rd shift in [employer], married.  She smoked one pack per day for 26 years but quit in 2009.          Current Outpatient Medications on File Prior to Visit  Medication Sig Dispense Refill  . acetaminophen (TYLENOL) 500 MG tablet Take 1,000 mg by mouth every 6 (six) hours as needed for mild pain, moderate pain, fever or headache.    Marland Kitchen aspirin 81 MG  EC tablet Take 81 mg by mouth daily.      . fluticasone (FLONASE) 50 MCG/ACT nasal spray Place 1 spray into both nostrils daily as needed for allergies or rhinitis. Reported on 07/11/2015    . glucose blood (ONE TOUCH ULTRA TEST) test strip 1 each by Other route 3 (three) times daily. And lancets 3/day 300 each 3  . Insulin Glargine (BASAGLAR KWIKPEN) 100 UNIT/ML SOPN INJECT 1.2MLS (120 UNITS) INTO THE SKIN AT BEDTIME) 15 pen 0  . Insulin Pen Needle (BD ULTRA-FINE PEN NEEDLES) 29G X 12.7MM MISC USE 5 TIMES A DAY AS DIRECTED 500 each 2  . lisinopril-hydrochlorothiazide (PRINZIDE,ZESTORETIC) 20-12.5 MG tablet TAKE 2 TABLETS BY MOUTH  DAILY 180 tablet 3  . Multiple Vitamin (MULTIVITAMIN WITH MINERALS) TABS tablet Take 1 tablet by mouth daily.    Marland Kitchen zinc gluconate 50 MG tablet Take 50 mg by mouth every other day.      No current facility-administered medications on file prior to visit.     No Known Allergies  Family History  Problem Relation Age of Onset  . Breast cancer Mother   .  Heart disease Mother   . Cancer Mother        Breast  . Coronary artery disease Father        starting in his 71s. A bypass and mitral infarction  . Heart disease Father        Coronary Disease, bypass and mitral infarction  . Colon cancer Paternal Grandmother     BP 124/64   Pulse 100   Wt 204 lb 9.6 oz (92.8 kg)   SpO2 97%   BMI 33.02 kg/m    Review of Systems No weight change    Objective:   Physical Exam VITAL SIGNS:  See vs page GENERAL: no distress Pulses: dorsalis pedis intact bilat.   MSK: no deformity of the feet CV: trace bilat leg edema Skin:  no ulcer on the feet.  normal color and temp on the feet. Neuro: sensation is intact to touch on the feet.     Lab Results  Component Value Date   HGBA1C 12.7 (A) 06/08/2017       Assessment & Plan:  Insulin-requiring type 2 DM:  Ongoing poor glycemic control.   Weight loss: has leveled off.  I encouraged pt to continue.  Polyuria: less now.  This may be the reason for lack of recent weight loss.   Patient Instructions  Please continue the same basaglar, and:  Resume the humalog at 50 units 3 times a day (just before each meal).   check your blood sugar twice a day.  vary the time of day when you check, between before the 3 meals, and at bedtime.  also check if you have symptoms of your blood sugar being too high or too low.  please keep a record of the readings and bring it to your next appointment here (or you can bring the meter itself).  You can write it on any piece of paper.  please call us sooner if your blood sugar goes below 70, or if you have a lot of readings over 200. Please call or message [place] next week, to tell us how the blood sugar is doing.   Please come back for a follow-up appointment in 2 months.

## 2017-06-08 NOTE — Patient Instructions (Signed)
Please continue the same basaglar, and:  Resume the humalog at 50 units 3 times a day (just before each meal).   check your blood sugar twice a day.  vary the time of day when you check, between before the 3 meals, and at bedtime.  also check if you have symptoms of your blood sugar being too high or too low.  please keep a record of the readings and bring it to your next appointment here (or you can bring the meter itself).  You can write it on any piece of paper.  please call us sooner if your blood sugar goes below 70, or if you have a lot of readings over 200. Please call or message [place] next week, to tell us how the blood sugar is doing.   Please come back for a follow-up appointment in 2 months.

## 2017-06-21 ENCOUNTER — Encounter: Payer: Self-pay | Admitting: Endocrinology

## 2017-06-21 MED ORDER — LISINOPRIL-HYDROCHLOROTHIAZIDE 20-12.5 MG PO TABS: 2.0000 | ORAL_TABLET | Freq: Every day | ORAL | 0 refills | Status: DC

## 2017-07-11 ENCOUNTER — Other Ambulatory Visit: Payer: Self-pay | Admitting: Endocrinology

## 2017-08-17 ENCOUNTER — Ambulatory Visit: Payer: 59 | Admitting: Endocrinology

## 2017-08-22 ENCOUNTER — Other Ambulatory Visit: Payer: Self-pay | Admitting: Endocrinology

## 2017-08-24 ENCOUNTER — Other Ambulatory Visit: Payer: Self-pay | Admitting: Endocrinology

## 2017-08-24 MED ORDER — BASAGLAR KWIKPEN 100 UNIT/ML ~~LOC~~ SOPN: PEN_INJECTOR | SUBCUTANEOUS | 0 refills | Status: DC

## 2017-09-14 ENCOUNTER — Ambulatory Visit: Payer: 59 | Admitting: Endocrinology

## 2017-09-14 ENCOUNTER — Encounter: Payer: Self-pay | Admitting: Endocrinology

## 2017-09-14 VITALS — BP 126/60 | HR 100 | Ht 66.0 in | Wt 233.6 lb

## 2017-09-14 DIAGNOSIS — Z794 Long term (current) use of insulin: Secondary | ICD-10-CM | POA: Diagnosis not present

## 2017-09-14 DIAGNOSIS — E119 Type 2 diabetes mellitus without complications: Secondary | ICD-10-CM | POA: Diagnosis not present

## 2017-09-14 LAB — POCT GLYCOSYLATED HEMOGLOBIN (HGB A1C): HEMOGLOBIN A1C: 8.5 % — AB (ref 4.0–5.6)

## 2017-09-14 MED ORDER — BASAGLAR KWIKPEN 100 UNIT/ML ~~LOC~~ SOPN
130.0000 [IU] | PEN_INJECTOR | Freq: Every day | SUBCUTANEOUS | 11 refills | Status: DC
Start: 2017-09-14 — End: 2018-06-13

## 2017-09-14 MED ORDER — INSULIN LISPRO 100 UNIT/ML (KWIKPEN): 55.0000 [IU] | PEN_INJECTOR | Freq: Three times a day (TID) | SUBCUTANEOUS | 11 refills | Status: DC

## 2017-09-14 NOTE — Progress Notes (Signed)
Subjective:    Patient ID: Catherine Moon, female    DOB: Jun 01, 1960, 57 y.o.   MRN: [id number]  HPI Pt returns for f/u of diabetes mellitus: DM type: Insulin-requiring type 2 Dx'ed: 5643 Complications: none.  Therapy: insulin since 2010.  GDM: never  DKA: never Severe hypoglycemia: never Pancreatitis: never Other: DM is characterized by severe insulin resistance; she takes multiple daily injections; she now works 1st shift.   Interval history: She feels much better recently.  She says cbg's are 160-279.  There is no trend throughout the day.    Past Medical History:  Diagnosis Date  . CTS (carpal tunnel syndrome)   . DM (diabetes mellitus) ([place])   . Dyslipidemia   . HTN (hypertension)   . Hypercholesterolemia   . Smoker   . Warts, genital     Past Surgical History:  Procedure Laterality Date  . COLONOSCOPY    . DILATION AND CURETTAGE OF UTERUS    . WISDOM TOOTH EXTRACTION      Social History   Socioeconomic History  . Marital status: Married    Spouse name: Not on file  . Number of children: Not on file  . Years of education: Not on file  . Highest education level: Not on file  Occupational History  . Not on file  Social Needs  . Financial resource strain: Not on file  . Food insecurity:    Worry: Not on file    Inability: Not on file  . Transportation needs:    Medical: Not on file    Non-medical: Not on file  Tobacco Use  . Smoking status: Former Smoker    Packs/day: 1.00    Years: 26.00    Pack years: 26.00    Types: Cigarettes    Last attempt to quit: 08/13/2007    Years since quitting: 10.1  . Smokeless tobacco: Never Used  . Tobacco comment:  She smoked one pack per day for 26 years but quit in 2009.  Substance and Sexual Activity  . Alcohol use: No    Alcohol/week: 0.0 standard drinks  . Drug use: No  . Sexual activity: Not on file  Lifestyle  . Physical activity:    Days per week: Not on file    Minutes per session: Not on file  .  Stress: Not on file  Relationships  . Social connections:    Talks on phone: Not on file    Gets together: Not on file    Attends religious service: Not on file    Active member of club or organization: Not on file    Attends meetings of clubs or organizations: Not on file    Relationship status: Not on file  . Intimate partner violence:    Fear of current or ex partner: Not on file    Emotionally abused: Not on file    Physically abused: Not on file    Forced sexual activity: Not on file  Other Topics Concern  . Not on file  Social History Narrative   Works 3rd shift in [employer], married.  She smoked one pack per day for 26 years but quit in 2009.          Current Outpatient Medications on File Prior to Visit  Medication Sig Dispense Refill  . acetaminophen (TYLENOL) 500 MG tablet Take 1,000 mg by mouth every 6 (six) hours as needed for mild pain, moderate pain, fever or headache.    Marland Kitchen aspirin 81 MG  EC tablet Take 81 mg by mouth daily.      Marland Kitchen atorvastatin (LIPITOR) 40 MG tablet TAKE 1 TABLET (40 MG TOTAL) BY MOUTH DAILY. 90 tablet 3  . fluticasone (FLONASE) 50 MCG/ACT nasal spray Place 1 spray into both nostrils daily as needed for allergies or rhinitis. Reported on 07/11/2015    . glucose blood (ONE TOUCH ULTRA TEST) test strip 1 each by Other route 3 (three) times daily. And lancets 3/day 300 each 3  . Multiple Vitamin (MULTIVITAMIN WITH MINERALS) TABS tablet Take 1 tablet by mouth daily.    Marland Kitchen zinc gluconate 50 MG tablet Take 50 mg by mouth every other day.      No current facility-administered medications on file prior to visit.     No Known Allergies  Family History  Problem Relation Age of Onset  . Breast cancer Mother   . Heart disease Mother   . Cancer Mother        Breast  . Coronary artery disease Father        starting in his 76s. A bypass and mitral infarction  . Heart disease Father        Coronary Disease, bypass and mitral infarction  . Colon  cancer Paternal Grandmother     BP 126/60 (BP Location: Right Arm)   Pulse 100   Ht 5\' 6"  (1.676 m)   Wt 233 lb 9.6 oz (106 kg)   SpO2 96%   BMI 37.70 kg/m    Review of Systems She denies hypoglycemia    Objective:   Physical Exam VITAL SIGNS:  See vs page GENERAL: no distress Pulses: foot pulses are intact bilaterally.   MSK: no deformity of the feet or ankles.  CV: 1+ bilat edema of the legs Skin:  no ulcer on the feet or ankles.  normal color and temp on the feet and ankles Neuro: sensation is intact to touch on the feet and ankles.       Lab Results  Component Value Date   HGBA1C 8.5 (A) 09/14/2017       Assessment & Plan:  Insulin-requiring type 2 DM: she needs increased rx   Patient Instructions  Please increase the insulins to the numbers listed below.  check your blood sugar twice a day.  vary the time of day when you check, between before the 3 meals, and at bedtime.  also check if you have symptoms of your blood sugar being too high or too low.  please keep a record of the readings and bring it to your next appointment here (or you can bring the meter itself).  You can write it on any piece of paper.  please call us sooner if your blood sugar goes below 70, or if you have a lot of readings over 200. Please call or message [place] next week, to tell us how the blood sugar is doing.   Please come back for a follow-up appointment in 2 months.

## 2017-09-14 NOTE — Patient Instructions (Addendum)
Please increase the insulins to the numbers listed below.  check your blood sugar twice a day.  vary the time of day when you check, between before the 3 meals, and at bedtime.  also check if you have symptoms of your blood sugar being too high or too low.  please keep a record of the readings and bring it to your next appointment here (or you can bring the meter itself).  You can write it on any piece of paper.  please call us sooner if your blood sugar goes below 70, or if you have a lot of readings over 200. Please call or message [place] next week, to tell us how the blood sugar is doing.   Please come back for a follow-up appointment in 2 months.

## 2017-09-15 ENCOUNTER — Other Ambulatory Visit: Payer: Self-pay

## 2017-09-15 MED ORDER — LISINOPRIL-HYDROCHLOROTHIAZIDE 20-12.5 MG PO TABS: 2.0000 | ORAL_TABLET | Freq: Every day | ORAL | 0 refills | Status: DC

## 2017-10-03 ENCOUNTER — Other Ambulatory Visit: Payer: Self-pay | Admitting: Endocrinology

## 2017-11-16 ENCOUNTER — Ambulatory Visit: Payer: 59 | Admitting: Endocrinology

## 2017-12-07 ENCOUNTER — Encounter: Payer: Self-pay | Admitting: Endocrinology

## 2017-12-07 ENCOUNTER — Ambulatory Visit: Payer: 59 | Admitting: Endocrinology

## 2017-12-07 VITALS — BP 152/82 | HR 95 | Ht 66.0 in | Wt 242.4 lb

## 2017-12-07 DIAGNOSIS — Z794 Long term (current) use of insulin: Secondary | ICD-10-CM

## 2017-12-07 DIAGNOSIS — E119 Type 2 diabetes mellitus without complications: Secondary | ICD-10-CM | POA: Diagnosis not present

## 2017-12-07 DIAGNOSIS — Z23 Encounter for immunization: Secondary | ICD-10-CM | POA: Diagnosis not present

## 2017-12-07 LAB — POCT GLYCOSYLATED HEMOGLOBIN (HGB A1C): Hemoglobin A1C: 8.8 % — AB (ref 4.0–5.6)

## 2017-12-07 MED ORDER — DEXCOM G6 SENSOR MISC: 1.0000 | 0 refills | Status: DC

## 2017-12-07 MED ORDER — DEXCOM G6 RECEIVER DEVI: 1.0000 | Freq: Once | 0 refills | Status: AC

## 2017-12-07 MED ORDER — INSULIN LISPRO (1 UNIT DIAL) 100 UNIT/ML (KWIKPEN): 65.0000 [IU] | PEN_INJECTOR | Freq: Three times a day (TID) | SUBCUTANEOUS | 11 refills | Status: DC

## 2017-12-07 MED ORDER — DEXCOM G6 TRANSMITTER MISC: 1.0000 | Freq: Once | 0 refills | Status: AC

## 2017-12-07 NOTE — Patient Instructions (Addendum)
Your blood pressure is high today.  Please see your primary care provider soon, to have it rechecked.   Please increase the humalog to 65 units 3 times a day (just before each meal) Please continue the same Basaglar I have sent a prescription to your pharmacy, for the continuous glucose monitor, and related equipment.  Please call if you need to see Vaughan Basta, to go over this.   check your blood sugar twice a day.  vary the time of day when you check, between before the 3 meals, and at bedtime.  also check if you have symptoms of your blood sugar being too high or too low.  please keep a record of the readings and bring it to your next appointment here (or you can bring the meter itself).  You can write it on any piece of paper.  please call us sooner if your blood sugar goes below 70, or if you have a lot of readings over 200.  Please come back for a follow-up appointment in 2 months.

## 2017-12-07 NOTE — Progress Notes (Signed)
Subjective:    Patient ID: Catherine Moon, female    DOB: September 24, 1960, 57 y.o.   MRN: [id number]  HPI Pt returns for f/u of diabetes mellitus: DM type: Insulin-requiring type 2 Dx'ed: 5638 Complications: none.  Therapy: insulin since 2010.  GDM: never  DKA: never Severe hypoglycemia: never.   Pancreatitis: never Other: DM is characterized by severe insulin resistance; she takes multiple daily injections; she now works 1st shift.   Interval history: pt states she feels well in general, except for bilat shoulder pain.  She says cbg's are 120-200.  There is no trend throughout the day.  Past Medical History:  Diagnosis Date  . CTS (carpal tunnel syndrome)   . DM (diabetes mellitus) ([place])   . Dyslipidemia   . HTN (hypertension)   . Hypercholesterolemia   . Smoker   . Warts, genital     Past Surgical History:  Procedure Laterality Date  . COLONOSCOPY    . DILATION AND CURETTAGE OF UTERUS    . WISDOM TOOTH EXTRACTION      Social History   Socioeconomic History  . Marital status: Married    Spouse name: Not on file  . Number of children: Not on file  . Years of education: Not on file  . Highest education level: Not on file  Occupational History  . Not on file  Social Needs  . Financial resource strain: Not on file  . Food insecurity:    Worry: Not on file    Inability: Not on file  . Transportation needs:    Medical: Not on file    Non-medical: Not on file  Tobacco Use  . Smoking status: Former Smoker    Packs/day: 1.00    Years: 26.00    Pack years: 26.00    Types: Cigarettes    Last attempt to quit: 08/13/2007    Years since quitting: 10.3  . Smokeless tobacco: Never Used  . Tobacco comment:  She smoked one pack per day for 26 years but quit in 2009.  Substance and Sexual Activity  . Alcohol use: No    Alcohol/week: 0.0 standard drinks  . Drug use: No  . Sexual activity: Not on file  Lifestyle  . Physical activity:    Days per week: Not on file   Minutes per session: Not on file  . Stress: Not on file  Relationships  . Social connections:    Talks on phone: Not on file    Gets together: Not on file    Attends religious service: Not on file    Active member of club or organization: Not on file    Attends meetings of clubs or organizations: Not on file    Relationship status: Not on file  . Intimate partner violence:    Fear of current or ex partner: Not on file    Emotionally abused: Not on file    Physically abused: Not on file    Forced sexual activity: Not on file  Other Topics Concern  . Not on file  Social History Narrative   Works 3rd shift in [employer], married.  She smoked one pack per day for 26 years but quit in 2009.          Current Outpatient Medications on File Prior to Visit  Medication Sig Dispense Refill  . acetaminophen (TYLENOL) 500 MG tablet Take 1,000 mg by mouth every 6 (six) hours as needed for mild pain, moderate pain, fever or headache.    Marland Kitchen  aspirin 81 MG EC tablet Take 81 mg by mouth daily.      Marland Kitchen atorvastatin (LIPITOR) 40 MG tablet TAKE 1 TABLET (40 MG TOTAL) BY MOUTH DAILY. 90 tablet 3  . fluticasone (FLONASE) 50 MCG/ACT nasal spray Place 1 spray into both nostrils daily as needed for allergies or rhinitis. Reported on 07/11/2015    . glucose blood (ONE TOUCH ULTRA TEST) test strip 1 each by Other route 3 (three) times daily. And lancets 3/day 300 each 3  . Insulin Glargine (BASAGLAR KWIKPEN) 100 UNIT/ML SOPN Inject 1.3 mLs (130 Units total) into the skin at bedtime. 20 pen 11  . lisinopril-hydrochlorothiazide (PRINZIDE,ZESTORETIC) 20-12.5 MG tablet Take 2 tablets by mouth daily. 180 tablet 0  . Multiple Vitamin (MULTIVITAMIN WITH MINERALS) TABS tablet Take 1 tablet by mouth daily.    Marland Kitchen zinc gluconate 50 MG tablet Take 50 mg by mouth every other day.      No current facility-administered medications on file prior to visit.     No Known Allergies  Family History  Problem Relation  Age of Onset  . Breast cancer Mother   . Heart disease Mother   . Cancer Mother        Breast  . Coronary artery disease Father        starting in his 66s. A bypass and mitral infarction  . Heart disease Father        Coronary Disease, bypass and mitral infarction  . Colon cancer Paternal Grandmother     BP (!) 152/82 (BP Location: Right Arm, Patient Position: Sitting, Cuff Size: Normal)   Pulse 95   Ht 5\' 6"  (1.676 m)   Wt 242 lb 6.4 oz (110 kg)   SpO2 99%   BMI 39.12 kg/m    Review of Systems She denies hypoglycemia.      Objective:   Physical Exam VITAL SIGNS:  See vs page GENERAL: no distress Pulses: foot pulses are intact bilaterally.   MSK: no deformity of the feet or ankles.  CV: 1+ bilat edema of the legs Skin:  no ulcer on the feet or ankles.  normal color and temp on the feet and ankles Neuro: sensation is intact to touch on the feet and ankles.    Lab Results  Component Value Date   HGBA1C 8.8 (A) 12/07/2017        Assessment & Plan:  HTN: is noted today Insulin-requiring type 2 DM: worse.    Patient Instructions  Your blood pressure is high today.  Please see your primary care provider soon, to have it rechecked.   Please increase the humalog to 65 units 3 times a day (just before each meal) Please continue the same Basaglar I have sent a prescription to your pharmacy, for the continuous glucose monitor, and related equipment.  Please call if you need to see Vaughan Basta, to go over this.   check your blood sugar twice a day.  vary the time of day when you check, between before the 3 meals, and at bedtime.  also check if you have symptoms of your blood sugar being too high or too low.  please keep a record of the readings and bring it to your next appointment here (or you can bring the meter itself).  You can write it on any piece of paper.  please call us sooner if your blood sugar goes below 70, or if you have a lot of readings over 200.  Please come back  for a  follow-up appointment in 2 months.

## 2017-12-15 ENCOUNTER — Telehealth: Payer: Self-pay

## 2017-12-15 NOTE — Telephone Encounter (Signed)
Copied from [place] [id number]. Topic: General - Other >> Dec 15, 2017  3:53 PM Yvette Rack wrote: Reason for CRM: New patient appt scheduled for 01/17/18 at 2 pm with Dr. Martin[place]. Patient verified mailing address for new patient paperwork.

## 2018-01-03 DIAGNOSIS — H40013 Open angle with borderline findings, low risk, bilateral: Secondary | ICD-10-CM | POA: Diagnosis not present

## 2018-01-17 ENCOUNTER — Encounter: Payer: Self-pay | Admitting: Family Medicine

## 2018-01-17 ENCOUNTER — Ambulatory Visit: Payer: 59 | Admitting: Family Medicine

## 2018-01-17 VITALS — BP 126/80 | HR 100 | Temp 98.4°F | Resp 12 | Ht 66.0 in | Wt 252.1 lb

## 2018-01-17 DIAGNOSIS — E1165 Type 2 diabetes mellitus with hyperglycemia: Secondary | ICD-10-CM | POA: Diagnosis not present

## 2018-01-17 DIAGNOSIS — Z794 Long term (current) use of insulin: Secondary | ICD-10-CM

## 2018-01-17 DIAGNOSIS — E7800 Pure hypercholesterolemia, unspecified: Secondary | ICD-10-CM

## 2018-01-17 DIAGNOSIS — I10 Essential (primary) hypertension: Secondary | ICD-10-CM | POA: Diagnosis not present

## 2018-01-17 DIAGNOSIS — IMO0001 Reserved for inherently not codable concepts without codable children: Secondary | ICD-10-CM

## 2018-01-17 MED ORDER — LISINOPRIL-HYDROCHLOROTHIAZIDE 20-12.5 MG PO TABS: 2.0000 | ORAL_TABLET | Freq: Every day | ORAL | 3 refills | Status: DC

## 2018-01-17 MED ORDER — ATORVASTATIN CALCIUM 40 MG PO TABS: 40.0000 mg | ORAL_TABLET | Freq: Every day | ORAL | 3 refills | Status: DC

## 2018-01-17 NOTE — Assessment & Plan Note (Signed)
She is working on better controlling her blood sugars. Continue following with Dr. Loanne Drilling.

## 2018-01-17 NOTE — Assessment & Plan Note (Signed)
We discussed benefits of wt loss as well as adverse effects of obesity. Consistency with healthy diet and physical activity recommended. Recommend daily brisk walking for 15-30 min as tolerated.

## 2018-01-17 NOTE — Assessment & Plan Note (Addendum)
Adequately controlled. No changes in current management. Continue low-salt diet. Eye exam current. Recommend monitoring BP at home. Because she follows with Dr. Lissa Merlin on every 3 to 4 months, I think it is appropriate to follow annually.

## 2018-01-17 NOTE — Assessment & Plan Note (Signed)
No changes in current management, will follow labs done today and will give further recommendations accordingly.  

## 2018-01-17 NOTE — Patient Instructions (Addendum)
A few things to remember from today's visit:   Essential hypertension - Plan: Comprehensive metabolic panel  HYPERCHOLESTEROLEMIA - Plan: Comprehensive metabolic panel, Lipid panel   Please be sure medication list is accurate. If a new problem present, please set up appointment sooner than planned today.

## 2018-01-17 NOTE — Progress Notes (Signed)
HPI:   Catherine Moon is a 58 y.o. female, who is here today to establish care.  Former PCP: Dr Loanne Drilling Last preventive routine visit: 11/2016.  Chronic medical problems: DM 2, hypertension, hyperlipidemia, allergies, adrenal hyperplasia,and former smoker. She lives with her husband. She follows with gynecology regularly, she has an appointment for her mammogram at her gynecologist's office.  She follows with Dr. Loanne Drilling, last visit in 12/2017. She follows q 3 months.  She does not exercise regularly and has not been consistent with a healthful diet.   Hyperlipidemia, currently she is on atorvastatin 40 mg daily. She is tolerating medication well, denies side effects.  Lab Results  Component Value Date   CHOL 189 01/15/2016   HDL 41.40 01/15/2016   LDLCALC 88 11/01/2014   LDLDIRECT 112.0 01/15/2016   TRIG 309.0 (H) 01/15/2016   CHOLHDL 5 01/15/2016    Hypertension: Denies severe/frequent headache, visual changes, chest pain, dyspnea, palpitation, claudication, focal weakness, or edema. Currently she is on lisinopril-HCTZ 20-12.5 mg 2 tablets daily. She does not check BP at home.   Lab Results  Component Value Date   CREATININE 0.84 01/15/2016   BUN 19 01/15/2016   NA 139 01/15/2016   K 3.9 01/15/2016   CL 102 01/15/2016   CO2 29 01/15/2016    Review of Systems  Constitutional: Negative for activity change, appetite change, fatigue and fever.  HENT: Negative for mouth sores, nosebleeds and trouble swallowing.   Eyes: Negative for redness and visual disturbance.  Respiratory: Negative for cough, shortness of breath and wheezing.   Cardiovascular: Negative for chest pain, palpitations and leg swelling.  Gastrointestinal: Negative for abdominal pain, nausea and vomiting.       Negative for changes in bowel habits.  Endocrine: Negative for polydipsia, polyphagia and polyuria.  Genitourinary: Negative for decreased urine volume, dysuria and  hematuria.  Skin: Negative for rash and wound.  Neurological: Negative for syncope, weakness, numbness and headaches.      Current Outpatient Medications on File Prior to Visit  Medication Sig Dispense Refill  . acetaminophen (TYLENOL) 500 MG tablet Take 1,000 mg by mouth every 6 (six) hours as needed for mild pain, moderate pain, fever or headache.    . Ascorbic Acid (VITAMIN C) 1000 MG tablet Take 1,000 mg by mouth daily.    Marland Kitchen aspirin 81 MG EC tablet Take 81 mg by mouth daily.      . Biotin 1000 MCG tablet Take 1,000 mcg by mouth daily.    . Continuous Blood Gluc Sensor (DEXCOM G6 SENSOR) MISC 1 Device by Does not apply route once a week. 12 each 0  . Fexofenadine HCl (ALLEGRA PO) Take by mouth.    . fluticasone (FLONASE) 50 MCG/ACT nasal spray Place 1 spray into both nostrils daily as needed for allergies or rhinitis. Reported on 07/11/2015    . glucose blood (ONE TOUCH ULTRA TEST) test strip 1 each by Other route 3 (three) times daily. And lancets 3/day 300 each 3  . Insulin Glargine (BASAGLAR KWIKPEN) 100 UNIT/ML SOPN Inject 1.3 mLs (130 Units total) into the skin at bedtime. 20 pen 11  . insulin lispro (HUMALOG KWIKPEN) 100 UNIT/ML KwikPen Inject 0.65 mLs (65 Units total) into the skin 3 (three) times daily with meals. And pen needle 5/day (Patient taking differently: Inject 55 Units into the skin 3 (three) times daily with meals. And pen needle 5/day) 25 pen 11  . Loratadine (CLARITIN PO) Take by mouth.    Marland Kitchen  magnesium oxide (MAG-OX) 400 MG tablet Take 400 mg by mouth daily.    . Multiple Vitamin (MULTIVITAMIN WITH MINERALS) TABS tablet Take 1 tablet by mouth daily.    . Potassium 99 MG TABS Take 1 tablet by mouth daily.    Marland Kitchen zinc gluconate 50 MG tablet Take 50 mg by mouth every other day.      No current facility-administered medications on file prior to visit.      Past Medical History:  Diagnosis Date  . CTS (carpal tunnel syndrome)   . DM (diabetes mellitus) ([place])   .  Dyslipidemia   . HTN (hypertension)   . Hypercholesterolemia   . Smoker   . Warts, genital    No Known Allergies  Family History  Problem Relation Age of Onset  . Breast cancer Mother   . Heart disease Mother   . Cancer Mother        Breast  . Coronary artery disease Father        starting in his 81s. A bypass and mitral infarction  . Heart disease Father        Coronary Disease, bypass and mitral infarction  . Colon cancer Paternal Grandmother     Social History   Socioeconomic History  . Marital status: Married    Spouse name: Not on file  . Number of children: Not on file  . Years of education: Not on file  . Highest education level: Not on file  Occupational History  . Not on file  Social Needs  . Financial resource strain: Not on file  . Food insecurity:    Worry: Not on file    Inability: Not on file  . Transportation needs:    Medical: Not on file    Non-medical: Not on file  Tobacco Use  . Smoking status: Former Smoker    Packs/day: 1.00    Years: 26.00    Pack years: 26.00    Types: Cigarettes    Last attempt to quit: 08/13/2007    Years since quitting: 10.4  . Smokeless tobacco: Never Used  . Tobacco comment:  She smoked one pack per day for 26 years but quit in 2009.  Substance and Sexual Activity  . Alcohol use: No    Alcohol/week: 0.0 standard drinks  . Drug use: No  . Sexual activity: Not on file  Lifestyle  . Physical activity:    Days per week: Not on file    Minutes per session: Not on file  . Stress: Not on file  Relationships  . Social connections:    Talks on phone: Not on file    Gets together: Not on file    Attends religious service: Not on file    Active member of club or organization: Not on file    Attends meetings of clubs or organizations: Not on file    Relationship status: Not on file  Other Topics Concern  . Not on file  Social History Narrative   Works 3rd shift in [employer], married.  She smoked one pack  per day for 26 years but quit in 2009.          Vitals:   01/17/18 1359  BP: 126/80  Pulse: 100  Resp: 12  Temp: 98.4 F (36.9 C)  SpO2: 95%    Body mass index is 40.69 kg/m.   Physical Exam  Nursing note and vitals reviewed. Constitutional: She is oriented to person, place, and time. She appears  well-developed. No distress.  HENT:  Head: Normocephalic and atraumatic.  Mouth/Throat: Oropharynx is clear and moist and mucous membranes are normal.  Eyes: Pupils are equal, round, and reactive to light. Conjunctivae are normal.  Cardiovascular: Normal rate and regular rhythm.  No murmur heard. Pulses:      Dorsalis pedis pulses are 2+ on the right side and 2+ on the left side.  Respiratory: Effort normal and breath sounds normal. No respiratory distress.  GI: Soft. She exhibits no mass. There is no hepatomegaly. There is no abdominal tenderness.  Musculoskeletal:        General: No edema.  Lymphadenopathy:    She has no cervical adenopathy.  Neurological: She is alert and oriented to person, place, and time. She has normal strength. No cranial nerve deficit. Gait normal.  Skin: Skin is warm. No rash noted. No erythema.  Psychiatric: She has a normal mood and affect.  Well groomed, good eye contact.     ASSESSMENT AND PLAN:   Catherine Moon was seen today for establish care.  Orders Placed This Encounter  Procedures  . Comprehensive metabolic panel  . Lipid panel    Lab Results  Component Value Date   CREATININE 0.68 01/18/2018   BUN 13 01/18/2018   NA 139 01/18/2018   K 4.5 01/18/2018   CL 101 01/18/2018   CO2 32 01/18/2018   Lab Results  Component Value Date   ALT 18 01/18/2018   AST 16 01/18/2018   ALKPHOS 71 01/18/2018   BILITOT 0.6 01/18/2018   Lab Results  Component Value Date   CHOL 171 01/18/2018   HDL 44.00 01/18/2018   LDLCALC 94 01/18/2018   LDLDIRECT 112.0 01/15/2016   TRIG 165.0 (H) 01/18/2018   CHOLHDL 4 01/18/2018     Obesity,  morbid, BMI 40.0-49.9 (H[facility]) We discussed benefits of wt loss as well as adverse effects of obesity. Consistency with healthy diet and physical activity recommended. Recommend daily brisk walking for 15-30 min as tolerated.   HYPERCHOLESTEROLEMIA No changes in current management, will follow labs done today and will give further recommendations accordingly.   Essential hypertension Adequately controlled. No changes in current management. Continue low-salt diet. Eye exam current. Recommend monitoring BP at home. Because she follows with Dr. Lissa Merlin on every 3 to 4 months, I think it is appropriate to follow annually.   Diabetes mellitus, insulin dependent (IDDM), uncontrolled ([place]) She is working on better controlling her blood sugars. Continue following with Dr. Loanne Drilling.    Return in about 10 months (around 11/16/2018) for cpe. labs tomorrow.      Elexa Kivi G. Martin[place], MD  [facility]. [place] office.

## 2018-01-18 LAB — COMPREHENSIVE METABOLIC PANEL
ALBUMIN: 4.2 g/dL (ref 3.5–5.2)
ALT: 18 U/L (ref 0–35)
AST: 16 U/L (ref 0–37)
Alkaline Phosphatase: 71 U/L (ref 39–117)
BUN: 13 mg/dL (ref 6–23)
CO2: 32 mEq/L (ref 19–32)
Calcium: 9.7 mg/dL (ref 8.4–10.5)
Chloride: 101 mEq/L (ref 96–112)
Creatinine, Ser: 0.68 mg/dL (ref 0.40–1.20)
GFR: 94.79 mL/min (ref >60.00)
Glucose, Bld: 130 mg/dL — ABNORMAL HIGH (ref 70–99)
Potassium: 4.5 mEq/L (ref 3.5–5.1)
Sodium: 139 mEq/L (ref 135–145)
Total Bilirubin: 0.6 mg/dL (ref 0.2–1.2)
Total Protein: 6.8 g/dL (ref 6.0–8.3)

## 2018-01-18 LAB — LIPID PANEL
Cholesterol: 171 mg/dL (ref 0–200)
HDL: 44 mg/dL (ref >39.00)
LDL Cholesterol: 94 mg/dL (ref 0–99)
NonHDL: 127.15
Total CHOL/HDL Ratio: 4
Triglycerides: 165 mg/dL — ABNORMAL HIGH (ref 0.0–149.0)
VLDL: 33 mg/dL (ref 0.0–40.0)

## 2018-01-21 ENCOUNTER — Encounter: Payer: Self-pay | Admitting: Family Medicine

## 2018-02-07 ENCOUNTER — Ambulatory Visit: Payer: 59 | Admitting: Endocrinology

## 2018-02-07 ENCOUNTER — Telehealth: Payer: Self-pay | Admitting: Endocrinology

## 2018-02-07 NOTE — Telephone Encounter (Signed)
Please refer to Dr. Ellison's response 

## 2018-02-07 NOTE — Telephone Encounter (Signed)
Please schedule f/u appt for next available appointment  

## 2018-02-07 NOTE — Telephone Encounter (Signed)
Patient no showed today's appt. Please advise on how to follow up. °A. No follow up necessary. °B. Follow up urgent. Contact patient immediately. °C. Follow up necessary. Contact patient and schedule visit in ___ days. °D. Follow up advised. Contact patient and schedule visit in ____weeks. ° °Would you like the NS fee to be applied to this visit? ° °

## 2018-02-07 NOTE — Telephone Encounter (Signed)
Patient is rescheduled for missed appointment on 02/21/18 at 2:00 p.m.

## 2018-02-21 ENCOUNTER — Encounter: Payer: Self-pay | Admitting: Endocrinology

## 2018-02-21 ENCOUNTER — Ambulatory Visit: Payer: 59 | Admitting: Endocrinology

## 2018-02-21 VITALS — BP 148/62 | HR 111 | Ht 66.0 in | Wt 252.4 lb

## 2018-02-21 DIAGNOSIS — E119 Type 2 diabetes mellitus without complications: Secondary | ICD-10-CM | POA: Diagnosis not present

## 2018-02-21 DIAGNOSIS — Z794 Long term (current) use of insulin: Secondary | ICD-10-CM

## 2018-02-21 LAB — POCT GLYCOSYLATED HEMOGLOBIN (HGB A1C): HEMOGLOBIN A1C: 8.4 % — AB (ref 4.0–5.6)

## 2018-02-21 MED ORDER — INSULIN LISPRO (1 UNIT DIAL) 100 UNIT/ML (KWIKPEN): 70.0000 [IU] | PEN_INJECTOR | Freq: Three times a day (TID) | SUBCUTANEOUS | 11 refills | Status: DC

## 2018-02-21 NOTE — Progress Notes (Signed)
Subjective:    Patient ID: Catherine Moon, female    DOB: 03-18-1960, 58 y.o.   MRN: [id number]  HPI Pt returns for f/u of diabetes mellitus: DM type: Insulin-requiring type 2 Dx'ed: 3790 Complications: none.  Therapy: insulin since 2010.  GDM: never  DKA: never Severe hypoglycemia: never.   Pancreatitis: never Other: DM is characterized by severe insulin resistance; she takes multiple daily injections; she works 1st shift.   Interval history: pt states she feels well in general.  She says cbg's are 160-252.  There is no trend throughout the day.   Past Medical History:  Diagnosis Date  . CTS (carpal tunnel syndrome)   . DM (diabetes mellitus) ([place])   . Dyslipidemia   . HTN (hypertension)   . Hypercholesterolemia   . Smoker   . Warts, genital     Past Surgical History:  Procedure Laterality Date  . COLONOSCOPY    . DILATION AND CURETTAGE OF UTERUS    . WISDOM TOOTH EXTRACTION      Social History   Socioeconomic History  . Marital status: Married    Spouse name: Not on file  . Number of children: Not on file  . Years of education: Not on file  . Highest education level: Not on file  Occupational History  . Not on file  Social Needs  . Financial resource strain: Not on file  . Food insecurity:    Worry: Not on file    Inability: Not on file  . Transportation needs:    Medical: Not on file    Non-medical: Not on file  Tobacco Use  . Smoking status: Former Smoker    Packs/day: 1.00    Years: 26.00    Pack years: 26.00    Types: Cigarettes    Last attempt to quit: 08/13/2007    Years since quitting: 10.5  . Smokeless tobacco: Never Used  . Tobacco comment:  She smoked one pack per day for 26 years but quit in 2009.  Substance and Sexual Activity  . Alcohol use: No    Alcohol/week: 0.0 standard drinks  . Drug use: No  . Sexual activity: Not on file  Lifestyle  . Physical activity:    Days per week: Not on file    Minutes per session: Not on  file  . Stress: Not on file  Relationships  . Social connections:    Talks on phone: Not on file    Gets together: Not on file    Attends religious service: Not on file    Active member of club or organization: Not on file    Attends meetings of clubs or organizations: Not on file    Relationship status: Not on file  . Intimate partner violence:    Fear of current or ex partner: Not on file    Emotionally abused: Not on file    Physically abused: Not on file    Forced sexual activity: Not on file  Other Topics Concern  . Not on file  Social History Narrative   Works 3rd shift in [employer], married.  She smoked one pack per day for 26 years but quit in 2009.          Current Outpatient Medications on File Prior to Visit  Medication Sig Dispense Refill  . acetaminophen (TYLENOL) 500 MG tablet Take 1,000 mg by mouth every 6 (six) hours as needed for mild pain, moderate pain, fever or headache.    Marland Kitchen  Ascorbic Acid (VITAMIN C) 1000 MG tablet Take 1,000 mg by mouth daily.    Marland Kitchen aspirin 81 MG EC tablet Take 81 mg by mouth daily.      Marland Kitchen atorvastatin (LIPITOR) 40 MG tablet Take 1 tablet (40 mg total) by mouth daily. 90 tablet 3  . Biotin 1000 MCG tablet Take 1,000 mcg by mouth daily.    Marland Kitchen Fexofenadine HCl (ALLEGRA PO) Take by mouth.    . fluticasone (FLONASE) 50 MCG/ACT nasal spray Place 1 spray into both nostrils daily as needed for allergies or rhinitis. Reported on 07/11/2015    . glucose blood (ONE TOUCH ULTRA TEST) test strip 1 each by Other route 3 (three) times daily. And lancets 3/day 300 each 3  . Insulin Glargine (BASAGLAR KWIKPEN) 100 UNIT/ML SOPN Inject 1.3 mLs (130 Units total) into the skin at bedtime. (Patient taking differently: Inject 130 Units into the skin daily. ) 20 pen 11  . lisinopril-hydrochlorothiazide (PRINZIDE,ZESTORETIC) 20-12.5 MG tablet Take 2 tablets by mouth daily. 180 tablet 3  . Loratadine (CLARITIN PO) Take by mouth.    . magnesium oxide (MAG-OX)  400 MG tablet Take 400 mg by mouth daily.    . Multiple Vitamin (MULTIVITAMIN WITH MINERALS) TABS tablet Take 1 tablet by mouth daily.    . Potassium 99 MG TABS Take 1 tablet by mouth daily.    Marland Kitchen zinc gluconate 50 MG tablet Take 50 mg by mouth every other day.      No current facility-administered medications on file prior to visit.     No Known Allergies  Family History  Problem Relation Age of Onset  . Breast cancer Mother   . Heart disease Mother   . Cancer Mother        Breast  . Coronary artery disease Father        starting in his 36s. A bypass and mitral infarction  . Heart disease Father        Coronary Disease, bypass and mitral infarction  . Colon cancer Paternal Grandmother     BP (!) 148/62 (BP Location: Left Arm, Patient Position: Sitting, Cuff Size: Large)   Pulse (!) 111   Ht 5\' 6"  (1.676 m)   Wt 252 lb 6.4 oz (114.5 kg)   SpO2 95%   BMI 40.74 kg/m    Review of Systems She denies hypoglycemia    Objective:   Physical Exam VITAL SIGNS:  See vs page GENERAL: no distress Pulses: dorsalis pedis intact bilat.   MSK: no deformity of the feet CV: no leg edema Skin:  no ulcer on the feet.  normal color and temp on the feet. Neuro: sensation is intact to touch on the feet  A1c=8.4%     Assessment & Plan:  HTN: is nopted today.   Insulin-requiring type 2 DM: she needs increased rx.    Patient Instructions  Your blood pressure is high today.  Please see your primary care provider soon, to have it rechecked.   Please increase the humalog to 70 units 3 times a day (just before each meal).   check your blood sugar twice a day.  vary the time of day when you check, between before the 3 meals, and at bedtime.  also check if you have symptoms of your blood sugar being too high or too low.  please keep a record of the readings and bring it to your next appointment here (or you can bring the meter itself).  You can write it  on any piece of paper.  please call us  sooner if your blood sugar goes below 70, or if you have a lot of readings over 200. Please come back for a follow-up appointment in 2 months.

## 2018-02-21 NOTE — Patient Instructions (Addendum)
Your blood pressure is high today.  Please see your primary care provider soon, to have it rechecked.   Please increase the humalog to 70 units 3 times a day (just before each meal).   check your blood sugar twice a day.  vary the time of day when you check, between before the 3 meals, and at bedtime.  also check if you have symptoms of your blood sugar being too high or too low.  please keep a record of the readings and bring it to your next appointment here (or you can bring the meter itself).  You can write it on any piece of paper.  please call us sooner if your blood sugar goes below 70, or if you have a lot of readings over 200. Please come back for a follow-up appointment in 2 months.

## 2018-03-04 ENCOUNTER — Encounter: Payer: Self-pay | Admitting: Gastroenterology

## 2018-04-25 ENCOUNTER — Encounter: Payer: Self-pay | Admitting: Endocrinology

## 2018-04-26 ENCOUNTER — Other Ambulatory Visit: Payer: Self-pay | Admitting: Endocrinology

## 2018-04-27 ENCOUNTER — Ambulatory Visit: Payer: 59 | Admitting: Endocrinology

## 2018-04-28 ENCOUNTER — Other Ambulatory Visit: Payer: Self-pay | Admitting: Endocrinology

## 2018-04-28 MED ORDER — INSULIN LISPRO (1 UNIT DIAL) 100 UNIT/ML (KWIKPEN): 70.0000 [IU] | PEN_INJECTOR | Freq: Three times a day (TID) | SUBCUTANEOUS | 11 refills | Status: DC

## 2018-06-13 ENCOUNTER — Other Ambulatory Visit: Payer: Self-pay

## 2018-06-13 ENCOUNTER — Ambulatory Visit (INDEPENDENT_AMBULATORY_CARE_PROVIDER_SITE_OTHER): Payer: 59 | Admitting: Endocrinology

## 2018-06-13 DIAGNOSIS — E1165 Type 2 diabetes mellitus with hyperglycemia: Secondary | ICD-10-CM | POA: Diagnosis not present

## 2018-06-13 DIAGNOSIS — Z794 Long term (current) use of insulin: Secondary | ICD-10-CM

## 2018-06-13 DIAGNOSIS — IMO0001 Reserved for inherently not codable concepts without codable children: Secondary | ICD-10-CM

## 2018-06-13 MED ORDER — INSULIN GLARGINE (2 UNIT DIAL) 300 UNIT/ML ~~LOC~~ SOPN: 150.0000 [IU] | PEN_INJECTOR | Freq: Every day | SUBCUTANEOUS | 11 refills | Status: DC

## 2018-06-13 MED ORDER — INSULIN LISPRO 200 UNIT/ML ~~LOC~~ SOPN: 80.0000 [IU] | PEN_INJECTOR | Freq: Three times a day (TID) | SUBCUTANEOUS | 11 refills | Status: DC

## 2018-06-13 NOTE — Progress Notes (Signed)
Subjective:    Patient ID: Catherine Moon, female    DOB: September 07, 1960, 58 y.o.   MRN: [id number]  HPI telehealth visit today via phone x 13 minutes Alternatives to telehealth are presented to this patient, and the patient agrees to the telehealth visit. Pt is advised of the cost of the visit, and agrees to this, also.   Patient is at home, and I am at the office.   Persons attending the telehealth visit: the patient and I Pt returns for f/u of diabetes mellitus: DM type: Insulin-requiring type 2 Dx'ed: 3536 Complications: none.  Therapy: insulin since 2010.  GDM: never  DKA: never Severe hypoglycemia: never.   Pancreatitis: never Other: DM is characterized by severe insulin resistance; she takes multiple daily injections; she works 1st shift.   Interval history: pt states she feels well in general.  She says cbg's are 220-250.  There is no trend throughout the day. pt states she feels well in general. Past Medical History:  Diagnosis Date  . CTS (carpal tunnel syndrome)   . DM (diabetes mellitus) ([place])   . Dyslipidemia   . HTN (hypertension)   . Hypercholesterolemia   . Smoker   . Warts, genital     Past Surgical History:  Procedure Laterality Date  . COLONOSCOPY    . DILATION AND CURETTAGE OF UTERUS    . WISDOM TOOTH EXTRACTION      Social History   Socioeconomic History  . Marital status: Married    Spouse name: Not on file  . Number of children: Not on file  . Years of education: Not on file  . Highest education level: Not on file  Occupational History  . Not on file  Social Needs  . Financial resource strain: Not on file  . Food insecurity:    Worry: Not on file    Inability: Not on file  . Transportation needs:    Medical: Not on file    Non-medical: Not on file  Tobacco Use  . Smoking status: Former Smoker    Packs/day: 1.00    Years: 26.00    Pack years: 26.00    Types: Cigarettes    Last attempt to quit: 08/13/2007    Years since  quitting: 10.8  . Smokeless tobacco: Never Used  . Tobacco comment:  She smoked one pack per day for 26 years but quit in 2009.  Substance and Sexual Activity  . Alcohol use: No    Alcohol/week: 0.0 standard drinks  . Drug use: No  . Sexual activity: Not on file  Lifestyle  . Physical activity:    Days per week: Not on file    Minutes per session: Not on file  . Stress: Not on file  Relationships  . Social connections:    Talks on phone: Not on file    Gets together: Not on file    Attends religious service: Not on file    Active member of club or organization: Not on file    Attends meetings of clubs or organizations: Not on file    Relationship status: Not on file  . Intimate partner violence:    Fear of current or ex partner: Not on file    Emotionally abused: Not on file    Physically abused: Not on file    Forced sexual activity: Not on file  Other Topics Concern  . Not on file  Social History Narrative   Works 3rd shift in [employer], married.  She smoked one pack per day for 26 years but quit in 2009.          Current Outpatient Medications on File Prior to Visit  Medication Sig Dispense Refill  . acetaminophen (TYLENOL) 500 MG tablet Take 1,000 mg by mouth every 6 (six) hours as needed for mild pain, moderate pain, fever or headache.    . Ascorbic Acid (VITAMIN C) 1000 MG tablet Take 1,000 mg by mouth daily.    Marland Kitchen aspirin 81 MG EC tablet Take 81 mg by mouth daily.      Marland Kitchen atorvastatin (LIPITOR) 40 MG tablet Take 1 tablet (40 mg total) by mouth daily. 90 tablet 3  . Biotin 1000 MCG tablet Take 1,000 mcg by mouth daily.    Marland Kitchen Fexofenadine HCl (ALLEGRA PO) Take by mouth.    . fluticasone (FLONASE) 50 MCG/ACT nasal spray Place 1 spray into both nostrils daily as needed for allergies or rhinitis. Reported on 07/11/2015    . glucose blood (ONE TOUCH ULTRA TEST) test strip 1 each by Other route 3 (three) times daily. And lancets 3/day 300 each 3  .  lisinopril-hydrochlorothiazide (PRINZIDE,ZESTORETIC) 20-12.5 MG tablet Take 2 tablets by mouth daily. 180 tablet 3  . Loratadine (CLARITIN PO) Take by mouth.    . magnesium oxide (MAG-OX) 400 MG tablet Take 400 mg by mouth daily.    . Multiple Vitamin (MULTIVITAMIN WITH MINERALS) TABS tablet Take 1 tablet by mouth daily.    . Potassium 99 MG TABS Take 1 tablet by mouth daily.    Marland Kitchen zinc gluconate 50 MG tablet Take 50 mg by mouth every other day.      No current facility-administered medications on file prior to visit.     No Known Allergies  Family History  Problem Relation Age of Onset  . Breast cancer Mother   . Heart disease Mother   . Cancer Mother        Breast  . Coronary artery disease Father        starting in his 21s. A bypass and mitral infarction  . Heart disease Father        Coronary Disease, bypass and mitral infarction  . Colon cancer Paternal Grandmother     Review of Systems She denies hypoglycemia    Objective:   Physical Exam   Lab Results  Component Value Date   CREATININE 0.68 01/18/2018   BUN 13 01/18/2018   NA 139 01/18/2018   K 4.5 01/18/2018   CL 101 01/18/2018   CO2 32 01/18/2018       Assessment & Plan:  Insulin-requiring type 2 DM: she needs increased rx. We'll also change to concentrated insulins.    Patient Instructions  Please increase the basaglar to 150 units daily, and:  Increase the humalog to 80 units 3 times a day (just before each meal).  check your blood sugar twice a day.  vary the time of day when you check, between before the 3 meals, and at bedtime.  also check if you have symptoms of your blood sugar being too high or too low.  please keep a record of the readings and bring it to your next appointment here (or you can bring the meter itself).  You can write it on any piece of paper.  please call us sooner if your blood sugar goes below 70, or if you have a lot of readings over 200. Please come back for a follow-up  appointment in 6 weeks.

## 2018-06-13 NOTE — Patient Instructions (Addendum)
Please increase the basaglar to 150 units daily, and:  Increase the humalog to 80 units 3 times a day (just before each meal).  check your blood sugar twice a day.  vary the time of day when you check, between before the 3 meals, and at bedtime.  also check if you have symptoms of your blood sugar being too high or too low.  please keep a record of the readings and bring it to your next appointment here (or you can bring the meter itself).  You can write it on any piece of paper.  please call us sooner if your blood sugar goes below 70, or if you have a lot of readings over 200. Please come back for a follow-up appointment in 6 weeks.

## 2018-07-11 ENCOUNTER — Other Ambulatory Visit: Payer: Self-pay | Admitting: Endocrinology

## 2018-07-11 ENCOUNTER — Encounter: Payer: Self-pay | Admitting: Endocrinology

## 2018-07-11 MED ORDER — TOUJEO MAX SOLOSTAR 300 UNIT/ML ~~LOC~~ SOPN: 150.0000 [IU] | PEN_INJECTOR | Freq: Every day | SUBCUTANEOUS | 11 refills | Status: DC

## 2018-07-11 NOTE — Telephone Encounter (Signed)
It appears pt has plenty of refills. Please review and advise

## 2018-07-17 ENCOUNTER — Other Ambulatory Visit: Payer: Self-pay

## 2018-07-17 DIAGNOSIS — IMO0001 Reserved for inherently not codable concepts without codable children: Secondary | ICD-10-CM

## 2018-07-17 MED ORDER — TOUJEO MAX SOLOSTAR 300 UNIT/ML ~~LOC~~ SOPN: 150.0000 [IU] | PEN_INJECTOR | Freq: Every day | SUBCUTANEOUS | 11 refills | Status: DC

## 2019-01-10 ENCOUNTER — Other Ambulatory Visit: Payer: Self-pay | Admitting: Endocrinology

## 2019-01-16 ENCOUNTER — Encounter: Payer: Self-pay | Admitting: Endocrinology

## 2019-01-16 LAB — HM DIABETES EYE EXAM

## 2019-01-17 ENCOUNTER — Other Ambulatory Visit: Payer: Self-pay

## 2019-01-17 DIAGNOSIS — E119 Type 2 diabetes mellitus without complications: Secondary | ICD-10-CM

## 2019-01-17 DIAGNOSIS — Z794 Long term (current) use of insulin: Secondary | ICD-10-CM

## 2019-01-17 MED ORDER — PEN NEEDLES 31G X 8 MM MISC: 1.0000 | Freq: Three times a day (TID) | 0 refills | Status: DC

## 2019-02-05 ENCOUNTER — Other Ambulatory Visit: Payer: Self-pay

## 2019-02-07 ENCOUNTER — Ambulatory Visit: Payer: 59 | Admitting: Endocrinology

## 2019-02-07 ENCOUNTER — Encounter: Payer: Self-pay | Admitting: Endocrinology

## 2019-02-07 ENCOUNTER — Other Ambulatory Visit: Payer: Self-pay

## 2019-02-07 VITALS — BP 162/60 | HR 131 | Ht 66.0 in | Wt 264.8 lb

## 2019-02-07 DIAGNOSIS — Z794 Long term (current) use of insulin: Secondary | ICD-10-CM | POA: Diagnosis not present

## 2019-02-07 DIAGNOSIS — E119 Type 2 diabetes mellitus without complications: Secondary | ICD-10-CM

## 2019-02-07 LAB — POCT GLYCOSYLATED HEMOGLOBIN (HGB A1C): Hemoglobin A1C: 9.3 % — AB (ref 4.0–5.6)

## 2019-02-07 MED ORDER — INSULIN PEN NEEDLE 29G X 12MM MISC: 1.0000 | Freq: Four times a day (QID) | 3 refills | Status: DC

## 2019-02-07 MED ORDER — OZEMPIC (0.25 OR 0.5 MG/DOSE) 2 MG/1.5ML ~~LOC~~ SOPN: 0.5000 mg | PEN_INJECTOR | SUBCUTANEOUS | 11 refills | Status: DC

## 2019-02-07 NOTE — Patient Instructions (Addendum)
I have sent a prescription to your pharmacy, to add "Ozempic." If you have no nausea with 0.5 mg, please call so I can increase to 1 mg per week.   Please continue the same insulins.  check your blood sugar twice a day.  vary the time of day when you check, between before the 3 meals, and at bedtime.  also check if you have symptoms of your blood sugar being too high or too low.  please keep a record of the readings and bring it to your next appointment here (or you can bring the meter itself).  You can write it on any piece of paper.  please call us sooner if your blood sugar goes below 70, or if you have a lot of readings over 200. Please come back for a follow-up appointment in 2 months.

## 2019-02-07 NOTE — Progress Notes (Signed)
Subjective:    Patient ID: Catherine Moon, female    DOB: Mar 06, 1960, 59 y.o.   MRN: [id number]  HPI Pt returns for f/u of diabetes mellitus: DM type: Insulin-requiring type 2 Dx'ed: 0000000 Complications: none.  Therapy: insulin since 2010.  GDM: never  DKA: never Severe hypoglycemia: never.   Pancreatitis: never Other: DM is characterized by severe insulin resistance; she takes multiple daily injections; she works 1st shift.   Interval history: pt states she feels well in general.  She says cbg's are 149-363.  It is in general lowest fasting. pt states she does not not miss the insulins.   Past Medical History:  Diagnosis Date  . CTS (carpal tunnel syndrome)   . DM (diabetes mellitus) ([place])   . Dyslipidemia   . HTN (hypertension)   . Hypercholesterolemia   . Smoker   . Warts, genital     Past Surgical History:  Procedure Laterality Date  . COLONOSCOPY    . DILATION AND CURETTAGE OF UTERUS    . WISDOM TOOTH EXTRACTION      Social History   Socioeconomic History  . Marital status: Married    Spouse name: Not on file  . Number of children: Not on file  . Years of education: Not on file  . Highest education level: Not on file  Occupational History  . Not on file  Tobacco Use  . Smoking status: Former Smoker    Packs/day: 1.00    Years: 26.00    Pack years: 26.00    Types: Cigarettes    Quit date: 08/13/2007    Years since quitting: 11.5  . Smokeless tobacco: Never Used  . Tobacco comment:  She smoked one pack per day for 26 years but quit in 2009.  Substance and Sexual Activity  . Alcohol use: No    Alcohol/week: 0.0 standard drinks  . Drug use: No  . Sexual activity: Not on file  Other Topics Concern  . Not on file  Social History Narrative   Works 3rd shift in [employer], married.  She smoked one pack per day for 26 years but quit in 2009.         Social Determinants of Health   Financial Resource Strain:   . Difficulty of Paying  Living Expenses: Not on file  Food Insecurity:   . Worried About Charity fundraiser in the Last Year: Not on file  . Ran Out of Food in the Last Year: Not on file  Transportation Needs:   . Lack of Transportation (Medical): Not on file  . Lack of Transportation (Non-Medical): Not on file  Physical Activity:   . Days of Exercise per Week: Not on file  . Minutes of Exercise per Session: Not on file  Stress:   . Feeling of Stress : Not on file  Social Connections:   . Frequency of Communication with Friends and Family: Not on file  . Frequency of Social Gatherings with Friends and Family: Not on file  . Attends Religious Services: Not on file  . Active Member of Clubs or Organizations: Not on file  . Attends Archivist Meetings: Not on file  . Marital Status: Not on file  Intimate Partner Violence:   . Fear of Current or Ex-Partner: Not on file  . Emotionally Abused: Not on file  . Physically Abused: Not on file  . Sexually Abused: Not on file    Current Outpatient Medications on File Prior to  Visit  Medication Sig Dispense Refill  . acetaminophen (TYLENOL) 500 MG tablet Take 1,000 mg by mouth every 6 (six) hours as needed for mild pain, moderate pain, fever or headache.    . Ascorbic Acid (VITAMIN C) 1000 MG tablet Take 1,000 mg by mouth daily.    Marland Kitchen aspirin 81 MG EC tablet Take 81 mg by mouth daily.      Marland Kitchen atorvastatin (LIPITOR) 40 MG tablet Take 1 tablet (40 mg total) by mouth daily. 90 tablet 3  . Fexofenadine HCl (ALLEGRA PO) Take by mouth.    . fluticasone (FLONASE) 50 MCG/ACT nasal spray Place 1 spray into both nostrils daily as needed for allergies or rhinitis. Reported on 07/11/2015    . glucose blood (ONE TOUCH ULTRA TEST) test strip 1 each by Other route 3 (three) times daily. And lancets 3/day 300 each 3  . Insulin Glargine, 2 Unit Dial, (TOUJEO MAX SOLOSTAR) 300 UNIT/ML SOPN Inject 150 Units into the skin daily. 6 pen 11  . Insulin Lispro (HUMALOG KWIKPEN) 200  UNIT/ML SOPN Inject 80 Units into the skin 3 (three) times daily with meals. And pen needles 4/day 25 pen 11  . lisinopril-hydrochlorothiazide (PRINZIDE,ZESTORETIC) 20-12.5 MG tablet Take 2 tablets by mouth daily. 180 tablet 3  . Loratadine (CLARITIN PO) Take by mouth.    . magnesium oxide (MAG-OX) 400 MG tablet Take 400 mg by mouth daily.    . Multiple Vitamin (MULTIVITAMIN WITH MINERALS) TABS tablet Take 1 tablet by mouth daily.    . Potassium 99 MG TABS Take 1 tablet by mouth daily.    Marland Kitchen zinc gluconate 50 MG tablet Take 50 mg by mouth every other day.      No current facility-administered medications on file prior to visit.    No Known Allergies  Family History  Problem Relation Age of Onset  . Breast cancer Mother   . Heart disease Mother   . Cancer Mother        Breast  . Coronary artery disease Father        starting in his 78s. A bypass and mitral infarction  . Heart disease Father        Coronary Disease, bypass and mitral infarction  . Colon cancer Paternal Grandmother     BP (!) 162/60 (BP Location: Left Arm, Patient Position: Sitting, Cuff Size: Large)   Pulse (!) 131   Ht 5\' 6"  (1.676 m)   Wt 264 lb 12.8 oz (120.1 kg)   SpO2 95%   BMI 42.74 kg/m    Review of Systems She has gained weight    Objective:   Physical Exam VITAL SIGNS:  See vs page GENERAL: no distress Pulses: dorsalis pedis intact bilat.   MSK: no deformity of the feet CV: 1+ bilat leg edema.   Skin:  no ulcer on the feet.  normal color and temp on the feet. Neuro: sensation is intact to touch on the feet.      Lab Results  Component Value Date   HGBA1C 9.3 (A) 02/07/2019      Assessment & Plan:  Insulin-requiring type 2 DM: worse Weight gain: Ozempic might help  Patient Instructions  I have sent a prescription to your pharmacy, to add "Ozempic." If you have no nausea with 0.5 mg, please call so I can increase to 1 mg per week.   Please continue the same insulins.  check your  blood sugar twice a day.  vary the time of day when you  check, between before the 3 meals, and at bedtime.  also check if you have symptoms of your blood sugar being too high or too low.  please keep a record of the readings and bring it to your next appointment here (or you can bring the meter itself).  You can write it on any piece of paper.  please call us sooner if your blood sugar goes below 70, or if you have a lot of readings over 200. Please come back for a follow-up appointment in 2 months.

## 2019-02-10 DIAGNOSIS — E119 Type 2 diabetes mellitus without complications: Secondary | ICD-10-CM | POA: Insufficient documentation

## 2019-02-10 DIAGNOSIS — Z794 Long term (current) use of insulin: Secondary | ICD-10-CM | POA: Insufficient documentation

## 2019-02-15 ENCOUNTER — Other Ambulatory Visit: Payer: Self-pay

## 2019-02-15 ENCOUNTER — Encounter: Payer: Self-pay | Admitting: Endocrinology

## 2019-02-15 DIAGNOSIS — Z794 Long term (current) use of insulin: Secondary | ICD-10-CM

## 2019-02-15 DIAGNOSIS — E119 Type 2 diabetes mellitus without complications: Secondary | ICD-10-CM

## 2019-02-15 MED ORDER — OZEMPIC (0.25 OR 0.5 MG/DOSE) 2 MG/1.5ML ~~LOC~~ SOPN: 0.5000 mg | PEN_INJECTOR | SUBCUTANEOUS | 11 refills | Status: DC

## 2019-02-23 ENCOUNTER — Other Ambulatory Visit: Payer: Self-pay | Admitting: Family Medicine

## 2019-02-23 DIAGNOSIS — I10 Essential (primary) hypertension: Secondary | ICD-10-CM

## 2019-03-08 ENCOUNTER — Encounter: Payer: Self-pay | Admitting: Endocrinology

## 2019-03-09 ENCOUNTER — Other Ambulatory Visit: Payer: Self-pay

## 2019-03-09 ENCOUNTER — Telehealth: Payer: Self-pay

## 2019-03-09 DIAGNOSIS — E119 Type 2 diabetes mellitus without complications: Secondary | ICD-10-CM

## 2019-03-09 MED ORDER — OZEMPIC (0.25 OR 0.5 MG/DOSE) 2 MG/1.5ML ~~LOC~~ SOPN: 0.5000 mg | PEN_INJECTOR | SUBCUTANEOUS | 11 refills | Status: DC

## 2019-03-09 NOTE — Telephone Encounter (Signed)
APPROVAL  Medication: Ozempic 0.5mg  Insurance Company: [employer] [facility] PA response: Approved  Documents have been labeled and placed in scan file for HIM and for our future reference.  Catherine Moon ([id number])  This request has received a Favorable outcome.  Please note any additional information provided by [employer] at the bottom of your screen.  You will also receive a faxed copy of the determination.

## 2019-03-09 NOTE — Telephone Encounter (Signed)
PRIOR AUTHORIZATION  PA initiation date: 03/09/19  Medication: Ozempic 0.5mg  Insurance Company: [employer] [facility] Submission completed electronically through [employer] My Meds: Yes  Will await insurance response re: approval/denial.  Catherine Moon ([id number])  Your information has been sent to [facility]. Catherine Moon ([id number])  [employer] is reviewing your PA request. Typically an electronic response will be received within 72 hours. To check for an update later, open this request from your dashboard.  You may close this dialog and return to your dashboard to perform other tasks. Catherine Moon (Key: [id number]) Ozempic (0.25 or 0.5 MG/DOSE) 2MG /1.5ML pen-injectors   Form [employer] Electronic Prior Authorization Form (2017 NCPDP) Created 5 minutes ago Sent to Plan 3 minutes ago Plan Response 3 minutes ago Submit Clinical Questions 2 minutes ago Determination Wait for Determination Please wait for [employer] 2017 NCPDP to return a determination.

## 2019-03-21 ENCOUNTER — Other Ambulatory Visit: Payer: Self-pay | Admitting: Family Medicine

## 2019-03-21 DIAGNOSIS — E7800 Pure hypercholesterolemia, unspecified: Secondary | ICD-10-CM

## 2019-03-29 ENCOUNTER — Ambulatory Visit: Payer: 59 | Attending: Internal Medicine

## 2019-03-29 DIAGNOSIS — Z23 Encounter for immunization: Secondary | ICD-10-CM

## 2019-03-29 NOTE — Progress Notes (Signed)
   Covid-19 Vaccination Clinic  Name:  Catherine Moon    MRN: [id number] DOB: [birth year]  03/29/2019  [name] was observed post Covid-19 immunization for 15 minutes without incident. She was provided with Vaccine Information Sheet and instruction to access the V-Safe system.   [name] was instructed to call 911 with any severe reactions post vaccine: Marland Kitchen Difficulty breathing  . Swelling of face and throat  . A fast heartbeat  . A bad rash all over body  . Dizziness and weakness   Immunizations Administered    Name Date Dose VIS Date Route   Pfizer COVID-19 Vaccine 03/29/2019  4:48 PM 0.3 mL 12/15/2018 Intramuscular   Manufacturer: [place]   Lot: [id number]   [place]: [id number]

## 2019-04-05 ENCOUNTER — Other Ambulatory Visit: Payer: Self-pay

## 2019-04-10 ENCOUNTER — Encounter: Payer: Self-pay | Admitting: Endocrinology

## 2019-04-10 ENCOUNTER — Other Ambulatory Visit: Payer: Self-pay

## 2019-04-10 ENCOUNTER — Ambulatory Visit: Payer: 59 | Admitting: Endocrinology

## 2019-04-10 VITALS — BP 200/70 | HR 111 | Ht 66.0 in | Wt 263.0 lb

## 2019-04-10 DIAGNOSIS — E119 Type 2 diabetes mellitus without complications: Secondary | ICD-10-CM | POA: Diagnosis not present

## 2019-04-10 DIAGNOSIS — Z794 Long term (current) use of insulin: Secondary | ICD-10-CM | POA: Diagnosis not present

## 2019-04-10 LAB — POCT GLYCOSYLATED HEMOGLOBIN (HGB A1C): Hemoglobin A1C: 8.3 % — AB (ref 4.0–5.6)

## 2019-04-10 MED ORDER — OZEMPIC (0.25 OR 0.5 MG/DOSE) 2 MG/1.5ML ~~LOC~~ SOPN: 0.5000 mg | PEN_INJECTOR | SUBCUTANEOUS | 11 refills | Status: DC

## 2019-04-10 MED ORDER — TOUJEO MAX SOLOSTAR 300 UNIT/ML ~~LOC~~ SOPN: 130.0000 [IU] | PEN_INJECTOR | Freq: Every day | SUBCUTANEOUS | 11 refills | Status: DC

## 2019-04-10 MED ORDER — HUMALOG KWIKPEN 200 UNIT/ML ~~LOC~~ SOPN: 80.0000 [IU] | PEN_INJECTOR | Freq: Three times a day (TID) | SUBCUTANEOUS | 11 refills | Status: DC

## 2019-04-10 NOTE — Patient Instructions (Addendum)
I have sent a prescription to your pharmacy, to add "Ozempic." If you have no nausea with 0.5 mg, please call so we can increase to 1 mg per week.   Please reduce the Toujeo to 130 units at bedtime, and:  Please continue the same humalog (except we are changing to double concentrated.   check your blood sugar twice a day.  vary the time of day when you check, between before the 3 meals, and at bedtime.  also check if you have symptoms of your blood sugar being too high or too low.  please keep a record of the readings and bring it to your next appointment here (or you can bring the meter itself).  You can write it on any piece of paper.  please call us sooner if your blood sugar goes below 70, or if you have a lot of readings over 200. Please come back for a follow-up appointment in 2 months.

## 2019-04-10 NOTE — Progress Notes (Signed)
Subjective:    Patient ID: Catherine Moon, female    DOB: Jun 09, 1960, 59 y.o.   MRN: [id number]  HPI Pt returns for f/u of diabetes mellitus: DM type: Insulin-requiring type 2 Dx'ed: 0000000 Complications: none.  Therapy: insulin since 2010, and Ozempic GDM: never  DKA: never Severe hypoglycemia: never.   Pancreatitis: never Other: DM is characterized by severe insulin resistance; she takes multiple daily injections; she works 1st shift; she has dexcom G-6 continuous glucose monitor. Interval history: pt states she feels well in general.  She says cbg's are 70-276.  It is in general lowest fasting, and highest after meals.  pt states she does not not miss the insulins.  Past Medical History:  Diagnosis Date  . CTS (carpal tunnel syndrome)   . DM (diabetes mellitus) ([place])   . Dyslipidemia   . HTN (hypertension)   . Hypercholesterolemia   . Smoker   . Warts, genital     Past Surgical History:  Procedure Laterality Date  . COLONOSCOPY    . DILATION AND CURETTAGE OF UTERUS    . WISDOM TOOTH EXTRACTION      Social History   Socioeconomic History  . Marital status: Married    Spouse name: Not on file  . Number of children: Not on file  . Years of education: Not on file  . Highest education level: Not on file  Occupational History  . Not on file  Tobacco Use  . Smoking status: Former Smoker    Packs/day: 1.00    Years: 26.00    Pack years: 26.00    Types: Cigarettes    Quit date: 08/13/2007    Years since quitting: 11.6  . Smokeless tobacco: Never Used  . Tobacco comment:  She smoked one pack per day for 26 years but quit in 2009.  Substance and Sexual Activity  . Alcohol use: No    Alcohol/week: 0.0 standard drinks  . Drug use: No  . Sexual activity: Not on file  Other Topics Concern  . Not on file  Social History Narrative   Works 3rd shift in [employer], married.  She smoked one pack per day for 26 years but quit in 2009.         Social  Determinants of Health   Financial Resource Strain:   . Difficulty of Paying Living Expenses:   Food Insecurity:   . Worried About Charity fundraiser in the Last Year:   . Arboriculturist in the Last Year:   Transportation Needs:   . Film/video editor (Medical):   Marland Kitchen Lack of Transportation (Non-Medical):   Physical Activity:   . Days of Exercise per Week:   . Minutes of Exercise per Session:   Stress:   . Feeling of Stress :   Social Connections:   . Frequency of Communication with Friends and Family:   . Frequency of Social Gatherings with Friends and Family:   . Attends Religious Services:   . Active Member of Clubs or Organizations:   . Attends Archivist Meetings:   Marland Kitchen Marital Status:   Intimate Partner Violence:   . Fear of Current or Ex-Partner:   . Emotionally Abused:   Marland Kitchen Physically Abused:   . Sexually Abused:     Current Outpatient Medications on File Prior to Visit  Medication Sig Dispense Refill  . acetaminophen (TYLENOL) 500 MG tablet Take 1,000 mg by mouth every 6 (six) hours as needed for mild  pain, moderate pain, fever or headache.    . Ascorbic Acid (VITAMIN C) 1000 MG tablet Take 1,000 mg by mouth daily.    Marland Kitchen aspirin 81 MG EC tablet Take 81 mg by mouth daily.      Marland Kitchen atorvastatin (LIPITOR) 40 MG tablet TAKE 1 TABLET BY MOUTH EVERY DAY 90 tablet 0  . Fexofenadine HCl (ALLEGRA PO) Take by mouth.    . fluticasone (FLONASE) 50 MCG/ACT nasal spray Place 1 spray into both nostrils daily as needed for allergies or rhinitis. Reported on 07/11/2015    . glucose blood (ONE TOUCH ULTRA TEST) test strip 1 each by Other route 3 (three) times daily. And lancets 3/day 300 each 3  . Insulin Pen Needle 29G X 12MM MISC 1 each by Does not apply route 4 (four) times daily. 360 each 3  . lisinopril-hydrochlorothiazide (ZESTORETIC) 20-12.5 MG tablet TAKE 2 TABLETS BY MOUTH EVERY DAY 180 tablet 0  . Loratadine (CLARITIN PO) Take by mouth.    . magnesium oxide (MAG-OX)  400 MG tablet Take 400 mg by mouth daily.    . Multiple Vitamin (MULTIVITAMIN WITH MINERALS) TABS tablet Take 1 tablet by mouth daily.    . Potassium 99 MG TABS Take 1 tablet by mouth daily.    Marland Kitchen zinc gluconate 50 MG tablet Take 50 mg by mouth every other day.      No current facility-administered medications on file prior to visit.    No Known Allergies  Family History  Problem Relation Age of Onset  . Breast cancer Mother   . Heart disease Mother   . Cancer Mother        Breast  . Coronary artery disease Father        starting in his 60s. A bypass and mitral infarction  . Heart disease Father        Coronary Disease, bypass and mitral infarction  . Colon cancer Paternal Grandmother     BP (!) 200/70   Pulse (!) 111   Ht 5\' 6"  (1.676 m)   Wt 263 lb (119.3 kg)   SpO2 94%   BMI 42.45 kg/m   Review of Systems Denies LOC    Objective:   Physical Exam VITAL SIGNS:  See vs page GENERAL: no distress Pulses: dorsalis pedis intact bilat.   MSK: no deformity of the feet CV: trace bilat leg edema Skin:  no ulcer on the feet, but the skin is dry.  normal color and temp on the feet. Neuro: sensation is intact to touch on the feet.    Lab Results  Component Value Date   HGBA1C 8.3 (A) 04/10/2019        Assessment & Plan:  Insulin-requiring type 2 DM: she needs increased rx Hypoglycemia: this limits aggressiveness of glycemic control.  We'll reduce insulin in favor of Ozemic HTN: recheck next time.  Patient Instructions  I have sent a prescription to your pharmacy, to add "Ozempic." If you have no nausea with 0.5 mg, please call so we can increase to 1 mg per week.   Please reduce the Toujeo to 130 units at bedtime, and:  Please continue the same humalog (except we are changing to double concentrated.   check your blood sugar twice a day.  vary the time of day when you check, between before the 3 meals, and at bedtime.  also check if you have symptoms of your blood  sugar being too high or too low.  please keep a record  of the readings and bring it to your next appointment here (or you can bring the meter itself).  You can write it on any piece of paper.  please call us sooner if your blood sugar goes below 70, or if you have a lot of readings over 200. Please come back for a follow-up appointment in 2 months.

## 2019-04-24 ENCOUNTER — Ambulatory Visit: Payer: 59 | Attending: Internal Medicine

## 2019-04-24 DIAGNOSIS — Z23 Encounter for immunization: Secondary | ICD-10-CM

## 2019-04-24 NOTE — Progress Notes (Signed)
   Covid-19 Vaccination Clinic  Name:  Catherine Moon    MRN: [id number] DOB: [birth year]  04/24/2019  Catherine Moon was observed post Covid-19 immunization for 15 minutes without incident. She was provided with Vaccine Information Sheet and instruction to access the V-Safe system.   Catherine Moon was instructed to call 911 with any severe reactions post vaccine: Marland Kitchen Difficulty breathing  . Swelling of face and throat  . A fast heartbeat  . A bad rash all over body  . Dizziness and weakness   Immunizations Administered    Name Date Dose VIS Date Route   Pfizer COVID-19 Vaccine 04/24/2019  3:05 PM 0.3 mL 02/28/2018 Intramuscular   Manufacturer: [place]   Lot: [id number]   [place]: [id number]

## 2019-05-18 ENCOUNTER — Encounter: Payer: Self-pay | Admitting: Endocrinology

## 2019-05-21 ENCOUNTER — Other Ambulatory Visit: Payer: Self-pay

## 2019-05-21 ENCOUNTER — Other Ambulatory Visit: Payer: Self-pay | Admitting: Endocrinology

## 2019-05-21 MED ORDER — OZEMPIC (1 MG/DOSE) 2 MG/1.5ML ~~LOC~~ SOPN: 1.0000 mg | PEN_INJECTOR | SUBCUTANEOUS | 3 refills | Status: DC

## 2019-05-21 NOTE — Progress Notes (Signed)
HPI: Catherine Moon is a 59 y.o. female, who is here today for her routine physical. She was last seen on 01/17/18. Last CPE: 11/2016.  Regular exercise 3 or more time per week: She is going to the gym 3 times per week. Following a healthy diet: She is trying to do better. She lives with her husband.  Chronic medical problems: DM II,HLD,HTN,allergies,and adrenal hyperplasia among some. She follows with endocrinologist q 3-4 months.  Lab Results  Component Value Date   HGBA1C 8.3 (A) 04/10/2019   Lab Results  Component Value Date   MICROALBUR 2.0 (H) 01/15/2016   MICROALBUR 1.7 11/01/2014   Pap smear: Reported as current.She follows with gyn [facility].  Immunization History  Administered Date(s) Administered  . Influenza Split 09/23/2011  . Influenza Whole 12/18/2009  . Influenza,inj,Quad PF,6+ Mos 09/25/2013, 11/01/2014, 01/15/2016, 10/12/2016, 12/07/2017  . PFIZER SARS-COV-2 Vaccination 03/29/2019, 04/24/2019  . Pneumococcal Polysaccharide-23 12/18/2009, 01/15/2016  . Td 02/04/2006  . Tdap 05/09/2013   Mammogram: 2016 (?). Planning on having mammogram in 06/2019 due to recent COVID 19 vaccine. Colonoscopy: 02/17/2015. DEXA: N/A  Hep C screening: 01/15/16 NR.  She has no concerns today.  HTN: She is on Lisinopril-HCTZ 20-12.5 mg daily. Negative for severe/frequent headache, visual changes, chest pain, dyspnea, claudication, or focal weakness.  Lab Results  Component Value Date   CREATININE 0.68 01/18/2018   BUN 13 01/18/2018   NA 139 01/18/2018   K 4.5 01/18/2018   CL 101 01/18/2018   CO2 32 01/18/2018   HLD: She is on Atorvastatin 40 mg daily. Tolerating medication well.  Lab Results  Component Value Date   CHOL 171 01/18/2018   HDL 44.00 01/18/2018   LDLCALC 94 01/18/2018   LDLDIRECT 112.0 01/15/2016   TRIG 165.0 (H) 01/18/2018   CHOLHDL 4 01/18/2018   Review of Systems  Constitutional: Negative for appetite  change, fatigue and fever.  HENT: Negative for dental problem, hearing loss, mouth sores and sore throat.   Eyes: Negative for pain and redness.  Respiratory: Negative for cough and wheezing.   Cardiovascular: Negative for palpitations and leg swelling.  Gastrointestinal: Negative for abdominal pain, nausea and vomiting.       No changes in bowel habits.  Endocrine: Negative for cold intolerance, heat intolerance, polydipsia, polyphagia and polyuria.  Genitourinary: Negative for decreased urine volume, dysuria, hematuria, vaginal bleeding and vaginal discharge.  Musculoskeletal: Negative for gait problem and myalgias.  Skin: Negative for color change and rash.  Allergic/Immunologic: Negative for environmental allergies.  Neurological: Negative for syncope, facial asymmetry and weakness.  Hematological: Negative for adenopathy. Does not bruise/bleed easily.  Psychiatric/Behavioral: Negative for confusion and sleep disturbance. The patient is not nervous/anxious.   All other systems reviewed and are negative.  Current Outpatient Medications on File Prior to Visit  Medication Sig Dispense Refill  . acetaminophen (TYLENOL) 500 MG tablet Take 1,000 mg by mouth every 6 (six) hours as needed for mild pain, moderate pain, fever or headache.    . Ascorbic Acid (VITAMIN C) 1000 MG tablet Take 1,000 mg by mouth daily.    Marland Kitchen aspirin 81 MG EC tablet Take 81 mg by mouth daily.      Marland Kitchen atorvastatin (LIPITOR) 40 MG tablet TAKE 1 TABLET BY MOUTH EVERY DAY 90 tablet 0  . Fexofenadine HCl (ALLEGRA PO) Take by mouth.    . fluticasone (FLONASE) 50 MCG/ACT nasal spray Place 1 spray into both nostrils daily as needed for allergies  or rhinitis. Reported on 07/11/2015    . glucose blood (ONE TOUCH ULTRA TEST) test strip 1 each by Other route 3 (three) times daily. And lancets 3/day 300 each 3  . HUMALOG KWIKPEN 100 UNIT/ML KwikPen     . insulin glargine, 2 Unit Dial, (TOUJEO MAX SOLOSTAR) 300 UNIT/ML Solostar Pen  Inject 130 Units into the skin at bedtime. 6 pen 11  . insulin lispro (HUMALOG KWIKPEN) 200 UNIT/ML KwikPen Inject 80 Units into the skin 3 (three) times daily with meals. And pen needles 4/day 15 pen 11  . Insulin Pen Needle 29G X 12MM MISC 1 each by Does not apply route 4 (four) times daily. 360 each 3  . Loratadine (CLARITIN PO) Take by mouth.    . magnesium oxide (MAG-OX) 400 MG tablet Take 400 mg by mouth daily.    . Multiple Vitamin (MULTIVITAMIN WITH MINERALS) TABS tablet Take 1 tablet by mouth daily.    . Potassium 99 MG TABS Take 1 tablet by mouth daily.    Marland Kitchen zinc gluconate 50 MG tablet Take 50 mg by mouth every other day.      No current facility-administered medications on file prior to visit.     Past Medical History:  Diagnosis Date  . CTS (carpal tunnel syndrome)   . DM (diabetes mellitus) ([place])   . Dyslipidemia   . HTN (hypertension)   . Hypercholesterolemia   . Smoker   . Warts, genital     Past Surgical History:  Procedure Laterality Date  . COLONOSCOPY    . DILATION AND CURETTAGE OF UTERUS    . WISDOM TOOTH EXTRACTION      No Known Allergies  Family History  Problem Relation Age of Onset  . Breast cancer Mother   . Heart disease Mother   . Cancer Mother        Breast  . Coronary artery disease Father        starting in his 32s. A bypass and mitral infarction  . Heart disease Father        Coronary Disease, bypass and mitral infarction  . Colon cancer Paternal Grandmother     Social History   Socioeconomic History  . Marital status: Married    Spouse name: Not on file  . Number of children: Not on file  . Years of education: Not on file  . Highest education level: Not on file  Occupational History  . Not on file  Tobacco Use  . Smoking status: Former Smoker    Packs/day: 1.00    Years: 26.00    Pack years: 26.00    Types: Cigarettes    Quit date: 08/13/2007    Years since quitting: 11.7  . Smokeless tobacco: Never Used  . Tobacco  comment:  She smoked one pack per day for 26 years but quit in 2009.  Substance and Sexual Activity  . Alcohol use: No    Alcohol/week: 0.0 standard drinks  . Drug use: No  . Sexual activity: Not on file  Other Topics Concern  . Not on file  Social History Narrative   Works 3rd shift in [employer], married.  She smoked one pack per day for 26 years but quit in 2009.         Social Determinants of Health   Financial Resource Strain:   . Difficulty of Paying Living Expenses:   Food Insecurity:   . Worried About Charity fundraiser in the Last Year:   .  Ran Out of Food in the Last Year:   Transportation Needs:   . Film/video editor (Medical):   Marland Kitchen Lack of Transportation (Non-Medical):   Physical Activity:   . Days of Exercise per Week:   . Minutes of Exercise per Session:   Stress:   . Feeling of Stress :   Social Connections:   . Frequency of Communication with Friends and Family:   . Frequency of Social Gatherings with Friends and Family:   . Attends Religious Services:   . Active Member of Clubs or Organizations:   . Attends Archivist Meetings:   Marland Kitchen Marital Status:    Vitals:   05/22/19 1014  BP: 130/80  Pulse: 97  Resp: 12  Temp: 97.8 F (36.6 C)  SpO2: 98%   Body mass index is 42.28 kg/m.  Wt Readings from Last 3 Encounters:  05/22/19 258 lb (117 kg)  04/10/19 263 lb (119.3 kg)  02/07/19 264 lb 12.8 oz (120.1 kg)   Physical Exam  Nursing note and vitals reviewed. Constitutional: She is oriented to person, place, and time. She appears well-developed. No distress.  HENT:  Head: Normocephalic and atraumatic.  Right Ear: Hearing, tympanic membrane, external ear and ear canal normal.  Left Ear: Hearing, tympanic membrane, external ear and ear canal normal.  Mouth/Throat: Uvula is midline, oropharynx is clear and moist and mucous membranes are normal.  Eyes: Pupils are equal, round, and reactive to light. Conjunctivae and EOM are  normal.  Neck: No tracheal deviation present. No thyromegaly present.  Cardiovascular: Normal rate and regular rhythm.  No murmur heard. Pulses:      Dorsalis pedis pulses are 2+ on the right side and 2+ on the left side.  Respiratory: Effort normal and breath sounds normal. No respiratory distress.  GI: Soft. She exhibits no mass. There is no hepatomegaly. There is no abdominal tenderness.  Genitourinary:    Genitourinary Comments: Deferred to gyn.   Musculoskeletal:        General: Edema (Trace pitting LE edema, bilateral) present.     Comments: No major deformity or signs of synovitis appreciated.  Lymphadenopathy:    She has no cervical adenopathy.       Right: No supraclavicular adenopathy present.       Left: No supraclavicular adenopathy present.  Neurological: She is alert and oriented to person, place, and time. She has normal strength. No cranial nerve deficit. Coordination and gait normal.  Reflex Scores:      Bicep reflexes are 2+ on the right side and 2+ on the left side.      Patellar reflexes are 2+ on the right side and 2+ on the left side. Skin: Skin is warm. No rash noted. No erythema.  Psychiatric: She has a normal mood and affect. Cognition and memory are normal.  Well groomed, good eye contact.   ASSESSMENT AND PLAN:  Catherine Moon was here today annual physical examination.   Orders Placed This Encounter  Procedures  . Lipid panel  . Comprehensive metabolic panel  . Microalbumin / creatinine urine ratio   Lab Results  Component Value Date   MICROALBUR <0.7 05/22/2019   MICROALBUR 2.0 (H) 01/15/2016   Lab Results  Component Value Date   ALT 19 05/22/2019   AST 18 05/22/2019   ALKPHOS 73 05/22/2019   BILITOT 0.5 05/22/2019    Lab Results  Component Value Date   CREATININE 0.72 05/22/2019   BUN 21 05/22/2019   NA  136 05/22/2019   K 4.1 05/22/2019   CL 102 05/22/2019   CO2 29 05/22/2019   Lab Results  Component Value Date    CHOL 175 05/22/2019   HDL 35.40 (L) 05/22/2019   LDLCALC 109 (H) 05/22/2019   LDLDIRECT 112.0 01/15/2016   TRIG 155.0 (H) 05/22/2019   CHOLHDL 5 05/22/2019   Routine general medical examination at a health care facility We discussed the importance of regular physical activity and healthy diet for prevention of chronic illness and/or complications. Preventive guidelines reviewed. Continue following with her gyn for her female preventive care. Mammogram in 06/2019. Vaccination up to date.  Ca++ and vit D supplementation recommended. Next CPE in a year.  Obesity, morbid, BMI 40.0-49.9 ([place]) She has lost wt. We discussed benefits of wt loss as well as adverse effects of obesity. Encouraged to be consist with healthful diet and physical activity.  Type 2 diabetes mellitus with hyperglycemia, without long-term current use of insulin (HCC) Problem is not well controlled. Following with endocrinologist.  Essential hypertension Today BP adequately controlled. Instructed to monitor BP regularly. No changes in current management.  Because she follows with Dr Loanne Drilling q 3-4 months,I think is is appropriate to continue following annually, before if needed.  -     lisinopril-hydrochlorothiazide (ZESTORETIC) 20-12.5 MG tablet; Take 2 tablets by mouth daily.   Hyperlipidemia associated with type 2 diabetes mellitus (HCC) Continue Atorvastatin 40 mg daily. Further recommendations according to FLP results.   Return in 1 year (on 05/21/2020).  Denyce Harr G. [place], MD  [facility]. [place] office.  Today you have you routine preventive visit. A few things to remember from today's visit:   Routine general medical examination at a health care facility  Obesity, morbid, BMI 40.0-49.9 ([place]), Chronic  Type 2 diabetes mellitus with hyperglycemia, without long-term current use of insulin ([place]), Chronic - Plan: Microalbumin / creatinine urine ratio  Essential hypertension - Plan:  lisinopril-hydrochlorothiazide (ZESTORETIC) 20-12.5 MG tablet, Comprehensive metabolic panel  Hyperlipidemia associated with type 2 diabetes mellitus ([place]) - Plan: Lipid panel, Comprehensive metabolic panel  If you need refills please call your pharmacy. Do not use My Chart to request refills or for acute issues that need immediate attention.    Please be sure medication list is accurate. If a new problem present, please set up appointment sooner than planned today.   Please call GI to schedule your colonoscopy and gyn to schedule your mammogram.    At least 150 minutes of moderate exercise per week, daily brisk walking for 15-30 min is a good exercise option. Healthy diet low in saturated (animal) fats and sweets and consisting of fresh fruits and vegetables, lean meats such as fish and white chicken and whole grains.  These are some of recommendations for screening depending of age and risk factors:  - Vaccines:  Tdap vaccine every 10 years.  Shingles vaccine recommended at age 74, could be given after 58 years of age but not sure about insurance coverage.   Pneumonia vaccines: Pneumovax at 71. Sometimes Pneumovax is giving earlier if history of smoking, lung disease,diabetes,kidney disease among some.  Screening for diabetes at age 54 and every 3 years.  Cervical cancer prevention:  Pap smear starts at 58 years of age and continues periodically until 58 years old in low risk women. Pap smear every 3 years between 35 and 2 years old. Pap smear every 3-5 years between women 48 and older if pap smear negative and HPV screening negative.   -  Breast cancer: Mammogram: There is disagreement between experts about when to start screening in low risk asymptomatic female but recent recommendations are to start screening at 44 and not later than 58 years old , every 1-2 years and after 58 yo q 2 years. Screening is recommended until 58 years old but some women can continue screening  depending of healthy issues.  Colon cancer screening: starts at 58 years old until 58 years old.  Cholesterol disorder screening at age 21 and every 3 years.N/A  Also recommended:  1. Dental visit- Brush and floss your teeth twice daily; visit your dentist twice a year. 2. Eye doctor- Get an eye exam at least every 2 years. 3. Helmet use- Always wear a helmet when riding a bicycle, motorcycle, rollerblading or skateboarding. 4. Safe sex- If you may be exposed to sexually transmitted infections, use a condom. 5. Seat belts- Seat belts can save your live; always wear one. 6. Smoke/Carbon Monoxide detectors- These detectors need to be installed on the appropriate level of your home. Replace batteries at least once a year. 7. Skin cancer- When out in the sun please cover up and use sunscreen 15 SPF or higher. 8. Violence- If anyone is threatening or hurting you, please tell your healthcare provider.  9. Drink alcohol in moderation- Limit alcohol intake to one drink or less per day. Never drink and drive.

## 2019-05-22 ENCOUNTER — Ambulatory Visit (INDEPENDENT_AMBULATORY_CARE_PROVIDER_SITE_OTHER): Payer: 59 | Admitting: Family Medicine

## 2019-05-22 ENCOUNTER — Other Ambulatory Visit: Payer: Self-pay

## 2019-05-22 ENCOUNTER — Encounter: Payer: Self-pay | Admitting: Family Medicine

## 2019-05-22 VITALS — BP 130/80 | HR 97 | Temp 97.8°F | Resp 12 | Ht 65.5 in | Wt 258.0 lb

## 2019-05-22 DIAGNOSIS — Z0000 Encounter for general adult medical examination without abnormal findings: Secondary | ICD-10-CM | POA: Diagnosis not present

## 2019-05-22 DIAGNOSIS — Z794 Long term (current) use of insulin: Secondary | ICD-10-CM

## 2019-05-22 DIAGNOSIS — E7800 Pure hypercholesterolemia, unspecified: Secondary | ICD-10-CM

## 2019-05-22 DIAGNOSIS — E1165 Type 2 diabetes mellitus with hyperglycemia: Secondary | ICD-10-CM | POA: Diagnosis not present

## 2019-05-22 DIAGNOSIS — E785 Hyperlipidemia, unspecified: Secondary | ICD-10-CM

## 2019-05-22 DIAGNOSIS — I10 Essential (primary) hypertension: Secondary | ICD-10-CM

## 2019-05-22 DIAGNOSIS — E1169 Type 2 diabetes mellitus with other specified complication: Secondary | ICD-10-CM

## 2019-05-22 LAB — COMPREHENSIVE METABOLIC PANEL
ALT: 19 U/L (ref 0–35)
AST: 18 U/L (ref 0–37)
Albumin: 4.4 g/dL (ref 3.5–5.2)
Alkaline Phosphatase: 73 U/L (ref 39–117)
BUN: 21 mg/dL (ref 6–23)
CO2: 29 mEq/L (ref 19–32)
Calcium: 9.4 mg/dL (ref 8.4–10.5)
Chloride: 102 mEq/L (ref 96–112)
Creatinine, Ser: 0.72 mg/dL (ref 0.40–1.20)
GFR: 83.1 mL/min (ref >60.00)
Glucose, Bld: 170 mg/dL — ABNORMAL HIGH (ref 70–99)
Potassium: 4.1 mEq/L (ref 3.5–5.1)
Sodium: 136 mEq/L (ref 135–145)
Total Bilirubin: 0.5 mg/dL (ref 0.2–1.2)
Total Protein: 7.3 g/dL (ref 6.0–8.3)

## 2019-05-22 LAB — MICROALBUMIN / CREATININE URINE RATIO
Creatinine,U: 48.8 mg/dL
Microalb Creat Ratio: 1.4 mg/g (ref 0.0–30.0)
Microalb, Ur: <0.7 mg/dL (ref 0.0–1.9)

## 2019-05-22 LAB — LIPID PANEL
Cholesterol: 175 mg/dL (ref 0–200)
HDL: 35.4 mg/dL — ABNORMAL LOW (ref >39.00)
LDL Cholesterol: 109 mg/dL — ABNORMAL HIGH (ref 0–99)
NonHDL: 140.08
Total CHOL/HDL Ratio: 5
Triglycerides: 155 mg/dL — ABNORMAL HIGH (ref 0.0–149.0)
VLDL: 31 mg/dL (ref 0.0–40.0)

## 2019-05-22 MED ORDER — LISINOPRIL-HYDROCHLOROTHIAZIDE 20-12.5 MG PO TABS: 2.0000 | ORAL_TABLET | Freq: Every day | ORAL | 3 refills | Status: DC

## 2019-05-22 MED ORDER — OZEMPIC (1 MG/DOSE) 2 MG/1.5ML ~~LOC~~ SOPN: 1.0000 mg | PEN_INJECTOR | SUBCUTANEOUS | 3 refills | Status: DC

## 2019-05-22 NOTE — Patient Instructions (Addendum)
Today you have you routine preventive visit. A few things to remember from today's visit:   Routine general medical examination at a health care facility  Obesity, morbid, BMI 40.0-49.9 ([place]), Chronic  Type 2 diabetes mellitus with hyperglycemia, without long-term current use of insulin ([place]), Chronic - Plan: Microalbumin / creatinine urine ratio  Essential hypertension - Plan: lisinopril-hydrochlorothiazide (ZESTORETIC) 20-12.5 MG tablet, Comprehensive metabolic panel  Hyperlipidemia associated with type 2 diabetes mellitus ([place]) - Plan: Lipid panel, Comprehensive metabolic panel  If you need refills please call your pharmacy. Do not use My Chart to request refills or for acute issues that need immediate attention.    Please be sure medication list is accurate. If a new problem present, please set up appointment sooner than planned today.   Please call GI to schedule your colonoscopy and gyn to schedule your mammogram.    At least 150 minutes of moderate exercise per week, daily brisk walking for 15-30 min is a good exercise option. Healthy diet low in saturated (animal) fats and sweets and consisting of fresh fruits and vegetables, lean meats such as fish and white chicken and whole grains.  These are some of recommendations for screening depending of age and risk factors:  - Vaccines:  Tdap vaccine every 10 years.  Shingles vaccine recommended at age 10, could be given after 58 years of age but not sure about insurance coverage.   Pneumonia vaccines: Pneumovax at 36. Sometimes Pneumovax is giving earlier if history of smoking, lung disease,diabetes,kidney disease among some.  Screening for diabetes at age 59 and every 3 years.  Cervical cancer prevention:  Pap smear starts at 58 years of age and continues periodically until 58 years old in low risk women. Pap smear every 3 years between 58 and 80 years old. Pap smear every 3-5 years between women 74 and older if pap  smear negative and HPV screening negative.   -Breast cancer: Mammogram: There is disagreement between experts about when to start screening in low risk asymptomatic female but recent recommendations are to start screening at 101 and not later than 58 years old , every 1-2 years and after 58 yo q 2 years. Screening is recommended until 58 years old but some women can continue screening depending of healthy issues.  Colon cancer screening: starts at 58 years old until 58 years old.  Cholesterol disorder screening at age 82 and every 3 years.N/A  Also recommended:  1. Dental visit- Brush and floss your teeth twice daily; visit your dentist twice a year. 2. Eye doctor- Get an eye exam at least every 2 years. 3. Helmet use- Always wear a helmet when riding a bicycle, motorcycle, rollerblading or skateboarding. 4. Safe sex- If you may be exposed to sexually transmitted infections, use a condom. 5. Seat belts- Seat belts can save your live; always wear one. 6. Smoke/Carbon Monoxide detectors- These detectors need to be installed on the appropriate level of your home. Replace batteries at least once a year. 7. Skin cancer- When out in the sun please cover up and use sunscreen 15 SPF or higher. 8. Violence- If anyone is threatening or hurting you, please tell your healthcare provider.  9. Drink alcohol in moderation- Limit alcohol intake to one drink or less per day. Never drink and drive.

## 2019-05-24 ENCOUNTER — Encounter: Payer: Self-pay | Admitting: Family Medicine

## 2019-05-24 MED ORDER — ATORVASTATIN CALCIUM 40 MG PO TABS: 40.0000 mg | ORAL_TABLET | Freq: Every day | ORAL | 3 refills | Status: DC

## 2019-06-26 ENCOUNTER — Other Ambulatory Visit: Payer: Self-pay

## 2019-06-26 ENCOUNTER — Encounter: Payer: Self-pay | Admitting: Endocrinology

## 2019-06-26 ENCOUNTER — Ambulatory Visit: Payer: 59 | Admitting: Endocrinology

## 2019-06-26 VITALS — BP 152/60 | HR 113 | Ht 65.5 in | Wt 255.0 lb

## 2019-06-26 DIAGNOSIS — Z794 Long term (current) use of insulin: Secondary | ICD-10-CM

## 2019-06-26 DIAGNOSIS — E119 Type 2 diabetes mellitus without complications: Secondary | ICD-10-CM

## 2019-06-26 LAB — POCT GLYCOSYLATED HEMOGLOBIN (HGB A1C): Hemoglobin A1C: 6.1 % — AB (ref 4.0–5.6)

## 2019-06-26 LAB — TSH: TSH: 1.25 u[IU]/mL (ref 0.35–4.50)

## 2019-06-26 MED ORDER — TOUJEO MAX SOLOSTAR 300 UNIT/ML ~~LOC~~ SOPN: 100.0000 [IU] | PEN_INJECTOR | Freq: Every day | SUBCUTANEOUS | 11 refills | Status: DC

## 2019-06-26 NOTE — Progress Notes (Signed)
Subjective:    Patient ID: Catherine Moon, female    DOB: [birth year], 59 y.o.   MRN: [id number]  HPI Pt returns for f/u of diabetes mellitus: DM type: Insulin-requiring type 2 Dx'ed: 6761 Complications: none.  Therapy: insulin since 2010, and Ozempic GDM: never  DKA: never Severe hypoglycemia: never.   Pancreatitis: never Other: DM is characterized by severe insulin resistance; she takes multiple daily injections; she works 1st shift; she has dexcom G-6 continuous glucose monitor.  Interval history: pt states she feels well in general.  She says cbg's are 80-215.  It is in general lowest in the middle of the night.  pt states she does not not miss meds.  She seldom has hypoglycemia, and these episodes are mild.   Past Medical History:  Diagnosis Date  . CTS (carpal tunnel syndrome)   . DM (diabetes mellitus) ([place])   . Dyslipidemia   . HTN (hypertension)   . Hypercholesterolemia   . Smoker   . Warts, genital     Past Surgical History:  Procedure Laterality Date  . COLONOSCOPY    . DILATION AND CURETTAGE OF UTERUS    . WISDOM TOOTH EXTRACTION      Social History   Socioeconomic History  . Marital status: Married    Spouse name: Not on file  . Number of children: Not on file  . Years of education: Not on file  . Highest education level: Not on file  Occupational History  . Not on file  Tobacco Use  . Smoking status: Former Smoker    Packs/day: 1.00    Years: 26.00    Pack years: 26.00    Types: Cigarettes    Quit date: 08/13/2007    Years since quitting: 11.8  . Smokeless tobacco: Never Used  . Tobacco comment:  She smoked one pack per day for 26 years but quit in 2009.  Substance and Sexual Activity  . Alcohol use: No    Alcohol/week: 0.0 standard drinks  . Drug use: No  . Sexual activity: Not on file  Other Topics Concern  . Not on file  Social History Narrative   Works 3rd shift in [employer], married.  She smoked one pack per day for  26 years but quit in 2009.         Social Determinants of Health   Financial Resource Strain:   . Difficulty of Paying Living Expenses:   Food Insecurity:   . Worried About Charity fundraiser in the Last Year:   . Arboriculturist in the Last Year:   Transportation Needs:   . Film/video editor (Medical):   Marland Kitchen Lack of Transportation (Non-Medical):   Physical Activity:   . Days of Exercise per Week:   . Minutes of Exercise per Session:   Stress:   . Feeling of Stress :   Social Connections:   . Frequency of Communication with Friends and Family:   . Frequency of Social Gatherings with Friends and Family:   . Attends Religious Services:   . Active Member of Clubs or Organizations:   . Attends Archivist Meetings:   Marland Kitchen Marital Status:   Intimate Partner Violence:   . Fear of Current or Ex-Partner:   . Emotionally Abused:   Marland Kitchen Physically Abused:   . Sexually Abused:     Current Outpatient Medications on File Prior to Visit  Medication Sig Dispense Refill  . acetaminophen (TYLENOL) 500 MG tablet Take  1,000 mg by mouth every 6 (six) hours as needed for mild pain, moderate pain, fever or headache.    . Ascorbic Acid (VITAMIN C) 1000 MG tablet Take 1,000 mg by mouth daily.    Marland Kitchen aspirin 81 MG EC tablet Take 81 mg by mouth daily.      Marland Kitchen atorvastatin (LIPITOR) 40 MG tablet Take 1 tablet (40 mg total) by mouth daily. 90 tablet 3  . Fexofenadine HCl (ALLEGRA PO) Take by mouth.    . fluticasone (FLONASE) 50 MCG/ACT nasal spray Place 1 spray into both nostrils daily as needed for allergies or rhinitis. Reported on 07/11/2015    . glucose blood (ONE TOUCH ULTRA TEST) test strip 1 each by Other route 3 (three) times daily. And lancets 3/day 300 each 3  . insulin lispro (HUMALOG KWIKPEN) 200 UNIT/ML KwikPen Inject 80 Units into the skin 3 (three) times daily with meals. And pen needles 4/day 15 pen 11  . Insulin Pen Needle 29G X 12MM MISC 1 each by Does not apply route 4 (four)  times daily. 360 each 3  . lisinopril-hydrochlorothiazide (ZESTORETIC) 20-12.5 MG tablet Take 2 tablets by mouth daily. 180 tablet 3  . Loratadine (CLARITIN PO) Take by mouth.    . magnesium oxide (MAG-OX) 400 MG tablet Take 400 mg by mouth daily.    . Multiple Vitamin (MULTIVITAMIN WITH MINERALS) TABS tablet Take 1 tablet by mouth daily.    . Potassium 99 MG TABS Take 1 tablet by mouth daily.    . Semaglutide, 1 MG/DOSE, (OZEMPIC, 1 MG/DOSE,) 2 MG/1.5ML SOPN Inject 1 mg into the skin once a week. 12 pen 3  . zinc gluconate 50 MG tablet Take 50 mg by mouth every other day.      No current facility-administered medications on file prior to visit.    No Known Allergies  Family History  Problem Relation Age of Onset  . Breast cancer Mother   . Heart disease Mother   . Cancer Mother        Breast  . Coronary artery disease Father        starting in his 98s. A bypass and mitral infarction  . Heart disease Father        Coronary Disease, bypass and mitral infarction  . Colon cancer Paternal Grandmother     BP (!) 152/60   Pulse (!) 113   Ht 5' 5.5" (1.664 m)   Wt 255 lb (115.7 kg)   SpO2 96%   BMI 41.79 kg/m    Review of Systems Denies LOC.      Objective:   Physical Exam VITAL SIGNS:  See vs page GENERAL: no distress Pulses: dorsalis pedis intact bilat.   MSK: no deformity of the feet CV: trace bilat leg edema Skin:  no ulcer on the feet.  normal color and temp on the feet.  Neuro: sensation is intact to touch on the feet.    Lab Results  Component Value Date   HGBA1C 6.1 (A) 06/26/2019       Assessment & Plan:  HTN: is noted today Insulin-requiring type 2 DM. Hypoglycemia, due to insulin: this limits aggressiveness of glycemic control.    Patient Instructions  Your blood pressure is high today.  Please see your primary care provider soon, to have it rechecked Please reduce the Toujeo to 100 units at bedtime, and:  Please continue the same other medications.   Blood tests are requested for you today.  We'll let you know  about the results.  check your blood sugar twice a day.  vary the time of day when you check, between before the 3 meals, and at bedtime.  also check if you have symptoms of your blood sugar being too high or too low.  please keep a record of the readings and bring it to your next appointment here (or you can bring the meter itself).  You can write it on any piece of paper.  please call us sooner if your blood sugar goes below 70, or if you have a lot of readings over 200. Please come back for a follow-up appointment in 2 months.

## 2019-06-26 NOTE — Patient Instructions (Addendum)
Your blood pressure is high today.  Please see your primary care provider soon, to have it rechecked Please reduce the Toujeo to 100 units at bedtime, and:  Please continue the same other medications.  Blood tests are requested for you today.  We'll let you know about the results.  check your blood sugar twice a day.  vary the time of day when you check, between before the 3 meals, and at bedtime.  also check if you have symptoms of your blood sugar being too high or too low.  please keep a record of the readings and bring it to your next appointment here (or you can bring the meter itself).  You can write it on any piece of paper.  please call us sooner if your blood sugar goes below 70, or if you have a lot of readings over 200. Please come back for a follow-up appointment in 2 months.

## 2019-08-29 ENCOUNTER — Other Ambulatory Visit: Payer: Self-pay

## 2019-08-29 ENCOUNTER — Encounter: Payer: Self-pay | Admitting: Endocrinology

## 2019-08-29 ENCOUNTER — Ambulatory Visit: Payer: 59 | Admitting: Endocrinology

## 2019-08-29 VITALS — BP 158/60 | HR 115 | Ht 65.5 in | Wt 251.2 lb

## 2019-08-29 DIAGNOSIS — E119 Type 2 diabetes mellitus without complications: Secondary | ICD-10-CM | POA: Diagnosis not present

## 2019-08-29 DIAGNOSIS — Z794 Long term (current) use of insulin: Secondary | ICD-10-CM

## 2019-08-29 LAB — POCT GLYCOSYLATED HEMOGLOBIN (HGB A1C): Hemoglobin A1C: 5.7 % — AB (ref 4.0–5.6)

## 2019-08-29 MED ORDER — HUMALOG KWIKPEN 200 UNIT/ML ~~LOC~~ SOPN: 70.0000 [IU] | PEN_INJECTOR | Freq: Three times a day (TID) | SUBCUTANEOUS | 3 refills | Status: DC

## 2019-08-29 NOTE — Patient Instructions (Addendum)
Your blood pressure and heart rate are high today.  Please see your primary care provider soon, to have it rechecked Please reduce the humalog to 70 units 3 times a day (just before each meal), and:  Please continue the same other medications.  check your blood sugar twice a day.  vary the time of day when you check, between before the 3 meals, and at bedtime.  also check if you have symptoms of your blood sugar being too high or too low.  please keep a record of the readings and bring it to your next appointment here (or you can bring the meter itself).  You can write it on any piece of paper.  please call us sooner if your blood sugar goes below 70, or if you have a lot of readings over 200. Please come back for a follow-up appointment in 2 months.

## 2019-08-29 NOTE — Progress Notes (Signed)
Subjective:    Patient ID: Catherine Moon, female    DOB: [birth year], 59 y.o.   MRN: [id number]  HPI Pt returns for f/u of diabetes mellitus: DM type: Insulin-requiring type 2 Dx'ed: 3614 Complications: none.  Therapy: insulin since 2010, and Ozempic GDM: never  DKA: never Severe hypoglycemia: never.   Pancreatitis: never Other: DM is characterized by severe insulin resistance; she takes multiple daily injections; she works 1st shift; she has dexcom G-6 continuous glucose monitor.  Interval history: pt states she feels well in general.  She says cbg's are 70-200.  It is in general highest fasting.  pt states she does not not miss meds.  She seldom has hypoglycemia, and these episodes are mild.  This happens in the afternoon. Past Medical History:  Diagnosis Date   CTS (carpal tunnel syndrome)    DM (diabetes mellitus) ([facility])    Dyslipidemia    HTN (hypertension)    Hypercholesterolemia    Smoker    Warts, genital     Past Surgical History:  Procedure Laterality Date   COLONOSCOPY     DILATION AND CURETTAGE OF UTERUS     WISDOM TOOTH EXTRACTION      Social History   Socioeconomic History   Marital status: Married    Spouse name: Not on file   Number of children: Not on file   Years of education: Not on file   Highest education level: Not on file  Occupational History   Not on file  Tobacco Use   Smoking status: Former Smoker    Packs/day: 1.00    Years: 26.00    Pack years: 26.00    Types: Cigarettes    Quit date: 08/13/2007    Years since quitting: 12.0   Smokeless tobacco: Never Used   Tobacco comment:  She smoked one pack per day for 26 years but quit in 2009.  Substance and Sexual Activity   Alcohol use: No    Alcohol/week: 0.0 standard drinks   Drug use: No   Sexual activity: Not on file  Other Topics Concern   Not on file  Social History Narrative   Works 3rd shift in [employer], married.  She smoked one pack  per day for 26 years but quit in 2009.         Social Determinants of Health   Financial Resource Strain:    Difficulty of Paying Living Expenses: Not on file  Food Insecurity:    Worried About Charity fundraiser in the Last Year: Not on file   [employer] of Food in the Last Year: Not on file  Transportation Needs:    Lack of Transportation (Medical): Not on file   Lack of Transportation (Non-Medical): Not on file  Physical Activity:    Days of Exercise per Week: Not on file   Minutes of Exercise per Session: Not on file  Stress:    Feeling of Stress : Not on file  Social Connections:    Frequency of Communication with Friends and Family: Not on file   Frequency of Social Gatherings with Friends and Family: Not on file   Attends Religious Services: Not on file   Active Member of Clubs or Organizations: Not on file   Attends Archivist Meetings: Not on file   Marital Status: Not on file  Intimate Partner Violence:    Fear of Current or Ex-Partner: Not on file   Emotionally Abused: Not on file   Physically  Abused: Not on file   Sexually Abused: Not on file    Current Outpatient Medications on File Prior to Visit  Medication Sig Dispense Refill   acetaminophen (TYLENOL) 500 MG tablet Take 1,000 mg by mouth every 6 (six) hours as needed for mild pain, moderate pain, fever or headache.     Ascorbic Acid (VITAMIN C) 1000 MG tablet Take 1,000 mg by mouth daily.     aspirin 81 MG EC tablet Take 81 mg by mouth daily.       atorvastatin (LIPITOR) 40 MG tablet Take 1 tablet (40 mg total) by mouth daily. 90 tablet 3   Fexofenadine HCl (ALLEGRA PO) Take by mouth.     fluticasone (FLONASE) 50 MCG/ACT nasal spray Place 1 spray into both nostrils daily as needed for allergies or rhinitis. Reported on 07/11/2015     glucose blood (ONE TOUCH ULTRA TEST) test strip 1 each by Other route 3 (three) times daily. And lancets 3/day 300 each 3   insulin glargine, 2  Unit Dial, (TOUJEO MAX SOLOSTAR) 300 UNIT/ML Solostar Pen Inject 100 Units into the skin at bedtime. 6 pen 11   Insulin Pen Needle 29G X 12MM MISC 1 each by Does not apply route 4 (four) times daily. 360 each 3   lisinopril-hydrochlorothiazide (ZESTORETIC) 20-12.5 MG tablet Take 2 tablets by mouth daily. 180 tablet 3   Loratadine (CLARITIN PO) Take by mouth.     magnesium oxide (MAG-OX) 400 MG tablet Take 400 mg by mouth daily.     Multiple Vitamin (MULTIVITAMIN WITH MINERALS) TABS tablet Take 1 tablet by mouth daily.     Potassium 99 MG TABS Take 1 tablet by mouth daily.     Semaglutide, 1 MG/DOSE, (OZEMPIC, 1 MG/DOSE,) 2 MG/1.5ML SOPN Inject 1 mg into the skin once a week. 12 pen 3   zinc gluconate 50 MG tablet Take 50 mg by mouth every other day.      No current facility-administered medications on file prior to visit.    No Known Allergies  Family History  Problem Relation Age of Onset   Breast cancer Mother    Heart disease Mother    Cancer Mother        Breast   Coronary artery disease Father        starting in his 81s. A bypass and mitral infarction   Heart disease Father        Coronary Disease, bypass and mitral infarction   Colon cancer Paternal Grandmother     BP (!) 158/60    Pulse (!) 115    Ht 5' 5.5" (1.664 m)    Wt 251 lb 3.2 oz (113.9 kg)    SpO2 95%    BMI 41.17 kg/m    Review of Systems Denies LOC    Objective:   Physical Exam VITAL SIGNS:  See vs page GENERAL: no distress Pulses: dorsalis pedis intact bilat.   MSK: no deformity of the feet CV: no leg edema Skin:  no ulcer on the feet.  normal color and temp on the feet. Neuro: sensation is intact to touch on the feet  Lab Results  Component Value Date   HGBA1C 5.7 (A) 08/29/2019    Lab Results  Component Value Date   TSH 1.25 06/26/2019   Lab Results  Component Value Date   CREATININE 0.72 05/22/2019   BUN 21 05/22/2019   NA 136 05/22/2019   K 4.1 05/22/2019   CL 102  05/22/2019  CO2 29 05/22/2019      Assessment & Plan:  HTN and tachycardia are noted today Insulin-requiring type 2 DM Hypoglycemia, due to insulin: this limits aggressiveness of glycemic control   Patient Instructions  Your blood pressure and heart rate are high today.  Please see your primary care provider soon, to have it rechecked Please reduce the humalog to 70 units 3 times a day (just before each meal), and:  Please continue the same other medications.  check your blood sugar twice a day.  vary the time of day when you check, between before the 3 meals, and at bedtime.  also check if you have symptoms of your blood sugar being too high or too low.  please keep a record of the readings and bring it to your next appointment here (or you can bring the meter itself).  You can write it on any piece of paper.  please call us sooner if your blood sugar goes below 70, or if you have a lot of readings over 200. Please come back for a follow-up appointment in 2 months.

## 2019-10-03 ENCOUNTER — Encounter: Payer: Self-pay | Admitting: Family Medicine

## 2019-10-31 ENCOUNTER — Ambulatory Visit: Payer: 59 | Admitting: Endocrinology

## 2019-10-31 ENCOUNTER — Encounter: Payer: Self-pay | Admitting: Endocrinology

## 2019-10-31 ENCOUNTER — Other Ambulatory Visit: Payer: Self-pay

## 2019-10-31 VITALS — BP 130/78 | HR 98 | Ht 65.0 in | Wt 248.4 lb

## 2019-10-31 DIAGNOSIS — E119 Type 2 diabetes mellitus without complications: Secondary | ICD-10-CM

## 2019-10-31 DIAGNOSIS — Z794 Long term (current) use of insulin: Secondary | ICD-10-CM

## 2019-10-31 LAB — POCT GLYCOSYLATED HEMOGLOBIN (HGB A1C): Hemoglobin A1C: 5.8 % — AB (ref 4.0–5.6)

## 2019-10-31 MED ORDER — HUMALOG KWIKPEN 200 UNIT/ML ~~LOC~~ SOPN: 60.0000 [IU] | PEN_INJECTOR | Freq: Three times a day (TID) | SUBCUTANEOUS | 3 refills | Status: DC

## 2019-10-31 NOTE — Progress Notes (Signed)
Subjective:    Patient ID: Catherine Moon, female    DOB: 1960-08-22, 59 y.o.   MRN: [id number]  HPI Pt returns for f/u of diabetes mellitus: DM type: Insulin-requiring type 2 Dx'ed: 5885 Complications: none.  Therapy: insulin since 2010, and Ozempic GDM: never  DKA: never Severe hypoglycemia: never.   Pancreatitis: never Other: DM is characterized by severe insulin resistance; she takes multiple daily injections; she works 1st shift; she has dexcom G-6 continuous glucose monitor.  Interval history: pt states she feels well in general.  She says cbg's are 64-220.  It is still in general highest fasting.  pt states she does not not miss meds.  She seldom has hypoglycemia, and these episodes are mild.  This happens in the afternoon.   Past Medical History:  Diagnosis Date  . CTS (carpal tunnel syndrome)   . DM (diabetes mellitus) ([place])   . Dyslipidemia   . HTN (hypertension)   . Hypercholesterolemia   . Smoker   . Warts, genital     Past Surgical History:  Procedure Laterality Date  . COLONOSCOPY    . DILATION AND CURETTAGE OF UTERUS    . WISDOM TOOTH EXTRACTION      Social History   Socioeconomic History  . Marital status: Married    Spouse name: Not on file  . Number of children: Not on file  . Years of education: Not on file  . Highest education level: Not on file  Occupational History  . Not on file  Tobacco Use  . Smoking status: Former Smoker    Packs/day: 1.00    Years: 26.00    Pack years: 26.00    Types: Cigarettes    Quit date: 08/13/2007    Years since quitting: 12.2  . Smokeless tobacco: Never Used  . Tobacco comment:  She smoked one pack per day for 26 years but quit in 2009.  Substance and Sexual Activity  . Alcohol use: No    Alcohol/week: 0.0 standard drinks  . Drug use: No  . Sexual activity: Not on file  Other Topics Concern  . Not on file  Social History Narrative   Works 3rd shift in [employer], married.  She smoked  one pack per day for 26 years but quit in 2009.         Social Determinants of Health   Financial Resource Strain:   . Difficulty of Paying Living Expenses: Not on file  Food Insecurity:   . Worried About Charity fundraiser in the Last Year: Not on file  . Ran Out of Food in the Last Year: Not on file  Transportation Needs:   . Lack of Transportation (Medical): Not on file  . Lack of Transportation (Non-Medical): Not on file  Physical Activity:   . Days of Exercise per Week: Not on file  . Minutes of Exercise per Session: Not on file  Stress:   . Feeling of Stress : Not on file  Social Connections:   . Frequency of Communication with Friends and Family: Not on file  . Frequency of Social Gatherings with Friends and Family: Not on file  . Attends Religious Services: Not on file  . Active Member of Clubs or Organizations: Not on file  . Attends Archivist Meetings: Not on file  . Marital Status: Not on file  Intimate Partner Violence:   . Fear of Current or Ex-Partner: Not on file  . Emotionally Abused: Not on file  .  Physically Abused: Not on file  . Sexually Abused: Not on file    Current Outpatient Medications on File Prior to Visit  Medication Sig Dispense Refill  . acetaminophen (TYLENOL) 500 MG tablet Take 1,000 mg by mouth every 6 (six) hours as needed for mild pain, moderate pain, fever or headache.    . Ascorbic Acid (VITAMIN C) 1000 MG tablet Take 1,000 mg by mouth daily.    Marland Kitchen aspirin 81 MG EC tablet Take 81 mg by mouth daily.      Marland Kitchen atorvastatin (LIPITOR) 40 MG tablet Take 1 tablet (40 mg total) by mouth daily. 90 tablet 3  . Fexofenadine HCl (ALLEGRA PO) Take by mouth.    . fluticasone (FLONASE) 50 MCG/ACT nasal spray Place 1 spray into both nostrils daily as needed for allergies or rhinitis. Reported on 07/11/2015    . glucose blood (ONE TOUCH ULTRA TEST) test strip 1 each by Other route 3 (three) times daily. And lancets 3/day 300 each 3  . insulin  glargine, 2 Unit Dial, (TOUJEO MAX SOLOSTAR) 300 UNIT/ML Solostar Pen Inject 100 Units into the skin at bedtime. 6 pen 11  . Insulin Pen Needle 29G X 12MM MISC 1 each by Does not apply route 4 (four) times daily. 360 each 3  . lisinopril-hydrochlorothiazide (ZESTORETIC) 20-12.5 MG tablet Take 2 tablets by mouth daily. 180 tablet 3  . Loratadine (CLARITIN PO) Take by mouth.    . magnesium oxide (MAG-OX) 400 MG tablet Take 400 mg by mouth daily.    . Multiple Vitamin (MULTIVITAMIN WITH MINERALS) TABS tablet Take 1 tablet by mouth daily.    . Potassium 99 MG TABS Take 1 tablet by mouth daily.    . Semaglutide, 1 MG/DOSE, (OZEMPIC, 1 MG/DOSE,) 2 MG/1.5ML SOPN Inject 1 mg into the skin once a week. 12 pen 3  . zinc gluconate 50 MG tablet Take 50 mg by mouth every other day.      No current facility-administered medications on file prior to visit.    No Known Allergies  Family History  Problem Relation Age of Onset  . Breast cancer Mother   . Heart disease Mother   . Cancer Mother        Breast  . Coronary artery disease Father        starting in his 73s. A bypass and mitral infarction  . Heart disease Father        Coronary Disease, bypass and mitral infarction  . Colon cancer Paternal Grandmother     BP 130/78   Pulse 98   Ht 5\' 5"  (1.651 m)   Wt 248 lb 6.4 oz (112.7 kg)   SpO2 97%   BMI 41.34 kg/m    Review of Systems     Objective:   Physical Exam VITAL SIGNS:  See vs page GENERAL: no distress Pulses: dorsalis pedis intact bilat.   MSK: no deformity of the feet CV: trace bilat leg edema Skin:  no ulcer on the feet.  normal color and temp on the feet. Neuro: sensation is intact to touch on the feet   Lab Results  Component Value Date   HGBA1C 5.8 (A) 10/31/2019       Assessment & Plan:  Insulin-requiring type 2 DM Hypoglycemia, due to insulin: this limits aggressiveness of glycemic control   Patient Instructions  Please reduce the humalog to 60 units 3  times a day (just before each meal), and:   Please continue the same other medications.  check your blood sugar twice a day.  vary the time of day when you check, between before the 3 meals, and at bedtime.  also check if you have symptoms of your blood sugar being too high or too low.  please keep a record of the readings and bring it to your next appointment here (or you can bring the meter itself).  You can write it on any piece of paper.  please call us sooner if your blood sugar goes below 70, or if you have a lot of readings over 200. Please come back for a follow-up appointment in 3 months.

## 2019-10-31 NOTE — Patient Instructions (Addendum)
Please reduce the humalog to 60 units 3 times a day (just before each meal), and:   Please continue the same other medications.   check your blood sugar twice a day.  vary the time of day when you check, between before the 3 meals, and at bedtime.  also check if you have symptoms of your blood sugar being too high or too low.  please keep a record of the readings and bring it to your next appointment here (or you can bring the meter itself).  You can write it on any piece of paper.  please call us sooner if your blood sugar goes below 70, or if you have a lot of readings over 200. Please come back for a follow-up appointment in 3 months.

## 2020-01-22 LAB — HM DIABETES EYE EXAM

## 2020-02-06 ENCOUNTER — Ambulatory Visit: Payer: 59 | Admitting: Endocrinology

## 2020-03-03 ENCOUNTER — Other Ambulatory Visit: Payer: Self-pay

## 2020-03-05 ENCOUNTER — Ambulatory Visit: Payer: 59 | Admitting: Endocrinology

## 2020-03-05 ENCOUNTER — Other Ambulatory Visit: Payer: Self-pay

## 2020-03-05 VITALS — BP 140/70 | HR 99 | Ht 66.0 in | Wt 250.0 lb

## 2020-03-05 DIAGNOSIS — E119 Type 2 diabetes mellitus without complications: Secondary | ICD-10-CM | POA: Diagnosis not present

## 2020-03-05 DIAGNOSIS — Z794 Long term (current) use of insulin: Secondary | ICD-10-CM

## 2020-03-05 LAB — POCT GLYCOSYLATED HEMOGLOBIN (HGB A1C): Hemoglobin A1C: 6.1 % — AB (ref 4.0–5.6)

## 2020-03-05 MED ORDER — TOUJEO MAX SOLOSTAR 300 UNIT/ML ~~LOC~~ SOPN: 80.0000 [IU] | PEN_INJECTOR | Freq: Every day | SUBCUTANEOUS | 3 refills | Status: DC

## 2020-03-05 NOTE — Patient Instructions (Addendum)
I have sent a prescription to your pharmacy, to reduce the Toujeo to 80 units at bedtime, and please continue the same other medications.   check your blood sugar twice a day.  vary the time of day when you check, between before the 3 meals, and at bedtime.  also check if you have symptoms of your blood sugar being too high or too low.  please keep a record of the readings and bring it to your next appointment here (or you can bring the meter itself).  You can write it on any piece of paper.  please call us sooner if your blood sugar goes below 70, or if you have a lot of readings over 200.   Please come back for a follow-up appointment in 4 months.

## 2020-03-05 NOTE — Progress Notes (Signed)
Subjective:    Patient ID: Catherine Moon, female    DOB: 1960-07-20, 60 y.o.   MRN: [id number]  HPI Pt returns for f/u of diabetes mellitus: DM type: Insulin-requiring type 2 Dx'ed: 3299 Complications: none.  Therapy: insulin since 2010, and Ozempic.   GDM: never  DKA: never Severe hypoglycemia: never.   Pancreatitis: never.   Other: DM is characterized by severe insulin resistance; she takes multiple daily injections; she works 1st shift; she has dexcom G-6 continuous glucose monitor.  Interval history: pt states she feels well in general.  She says cbg's are 64-234.  She has mild hypoglycemia approx BIW.  This happens fasting, or when a meal is delayed.  pt states she does not not miss meds.   Past Medical History:  Diagnosis Date  . CTS (carpal tunnel syndrome)   . DM (diabetes mellitus) ([place])   . Dyslipidemia   . HTN (hypertension)   . Hypercholesterolemia   . Smoker   . Warts, genital     Past Surgical History:  Procedure Laterality Date  . COLONOSCOPY    . DILATION AND CURETTAGE OF UTERUS    . WISDOM TOOTH EXTRACTION      Social History   Socioeconomic History  . Marital status: Married    Spouse name: Not on file  . Number of children: Not on file  . Years of education: Not on file  . Highest education level: Not on file  Occupational History  . Not on file  Tobacco Use  . Smoking status: Former Smoker    Packs/day: 1.00    Years: 26.00    Pack years: 26.00    Types: Cigarettes    Quit date: 08/13/2007    Years since quitting: 12.5  . Smokeless tobacco: Never Used  . Tobacco comment:  She smoked one pack per day for 26 years but quit in 2009.  Substance and Sexual Activity  . Alcohol use: No    Alcohol/week: 0.0 standard drinks  . Drug use: No  . Sexual activity: Not on file  Other Topics Concern  . Not on file  Social History Narrative   Works 3rd shift in [employer], married.  She smoked one pack per day for 26 years but  quit in 2009.         Social Determinants of Health   Financial Resource Strain: Not on file  Food Insecurity: Not on file  Transportation Needs: Not on file  Physical Activity: Not on file  Stress: Not on file  Social Connections: Not on file  Intimate Partner Violence: Not on file    Current Outpatient Medications on File Prior to Visit  Medication Sig Dispense Refill  . acetaminophen (TYLENOL) 500 MG tablet Take 1,000 mg by mouth every 6 (six) hours as needed for mild pain, moderate pain, fever or headache.    . Ascorbic Acid (VITAMIN C) 1000 MG tablet Take 1,000 mg by mouth daily.    Marland Kitchen aspirin 81 MG EC tablet Take 81 mg by mouth daily.    Marland Kitchen atorvastatin (LIPITOR) 40 MG tablet Take 1 tablet (40 mg total) by mouth daily. 90 tablet 3  . Fexofenadine HCl (ALLEGRA PO) Take by mouth.    . fluticasone (FLONASE) 50 MCG/ACT nasal spray Place 1 spray into both nostrils daily as needed for allergies or rhinitis. Reported on 07/11/2015    . glucose blood (ONE TOUCH ULTRA TEST) test strip 1 each by Other route 3 (three) times daily. And  lancets 3/day 300 each 3  . insulin lispro (HUMALOG KWIKPEN) 200 UNIT/ML KwikPen Inject 60 Units into the skin 3 (three) times daily with meals. And pen needles 4/day 180 mL 3  . Insulin Pen Needle 29G X 12MM MISC 1 each by Does not apply route 4 (four) times daily. 360 each 3  . lisinopril-hydrochlorothiazide (ZESTORETIC) 20-12.5 MG tablet Take 2 tablets by mouth daily. 180 tablet 3  . Loratadine (CLARITIN PO) Take by mouth.    . magnesium oxide (MAG-OX) 400 MG tablet Take 400 mg by mouth daily.    . Multiple Vitamin (MULTIVITAMIN WITH MINERALS) TABS tablet Take 1 tablet by mouth daily.    . Potassium 99 MG TABS Take 1 tablet by mouth daily.    . Semaglutide, 1 MG/DOSE, (OZEMPIC, 1 MG/DOSE,) 2 MG/1.5ML SOPN Inject 1 mg into the skin once a week. 12 pen 3  . zinc gluconate 50 MG tablet Take 50 mg by mouth every other day.      No current facility-administered  medications on file prior to visit.    No Known Allergies  Family History  Problem Relation Age of Onset  . Breast cancer Mother   . Heart disease Mother   . Cancer Mother        Breast  . Coronary artery disease Father        starting in his 44s. A bypass and mitral infarction  . Heart disease Father        Coronary Disease, bypass and mitral infarction  . Colon cancer Paternal Grandmother     BP 140/70 (BP Location: Right Arm, Patient Position: Sitting, Cuff Size: Large)   Pulse 99   Ht 5\' 6"  (1.676 m)   Wt 250 lb (113.4 kg)   SpO2 95%   BMI 40.35 kg/m    Review of Systems Denies LOC    Objective:   Physical Exam VITAL SIGNS:  See vs page GENERAL: no distress Pulses: dorsalis pedis absent bilat (poss due to edema) MSK: no deformity of the feet CV: 1+ bilat leg edema Skin:  no ulcer on the feet.  normal color and temp on the feet.  Neuro: sensation is intact to touch on the feet.    Lab Results  Component Value Date   CREATININE 0.72 05/22/2019   BUN 21 05/22/2019   NA 136 05/22/2019   K 4.1 05/22/2019   CL 102 05/22/2019   CO2 29 05/22/2019       Assessment & Plan:  Insulin-requiring type 2 DM: overcontrolled Hypoglycemia, due to insulin.   Patient Instructions  I have sent a prescription to your pharmacy, to reduce the Toujeo to 80 units at bedtime, and please continue the same other medications.   check your blood sugar twice a day.  vary the time of day when you check, between before the 3 meals, and at bedtime.  also check if you have symptoms of your blood sugar being too high or too low.  please keep a record of the readings and bring it to your next appointment here (or you can bring the meter itself).  You can write it on any piece of paper.  please call us sooner if your blood sugar goes below 70, or if you have a lot of readings over 200.   Please come back for a follow-up appointment in 4 months.

## 2020-04-17 ENCOUNTER — Telehealth: Payer: Self-pay | Admitting: Endocrinology

## 2020-04-17 NOTE — Telephone Encounter (Signed)
Recv'd records from [place] forwarded 2 pages to Dr. Renato Shin 4/14/22fbg

## 2020-06-28 ENCOUNTER — Other Ambulatory Visit: Payer: Self-pay | Admitting: Family Medicine

## 2020-06-28 DIAGNOSIS — I10 Essential (primary) hypertension: Secondary | ICD-10-CM

## 2020-07-09 ENCOUNTER — Ambulatory Visit: Payer: 59 | Admitting: Endocrinology

## 2020-12-10 ENCOUNTER — Encounter: Payer: Self-pay | Admitting: Family Medicine

## 2020-12-10 ENCOUNTER — Ambulatory Visit (INDEPENDENT_AMBULATORY_CARE_PROVIDER_SITE_OTHER): Payer: BC Managed Care – PPO | Admitting: Family Medicine

## 2020-12-10 VITALS — BP 132/72 | HR 90 | Temp 97.8°F | Resp 16 | Ht 66.0 in | Wt 188.2 lb

## 2020-12-10 DIAGNOSIS — Z6830 Body mass index (BMI) 30.0-30.9, adult: Secondary | ICD-10-CM

## 2020-12-10 DIAGNOSIS — L02411 Cutaneous abscess of right axilla: Secondary | ICD-10-CM

## 2020-12-10 DIAGNOSIS — E1165 Type 2 diabetes mellitus with hyperglycemia: Secondary | ICD-10-CM

## 2020-12-10 DIAGNOSIS — Z23 Encounter for immunization: Secondary | ICD-10-CM

## 2020-12-10 DIAGNOSIS — E1169 Type 2 diabetes mellitus with other specified complication: Secondary | ICD-10-CM

## 2020-12-10 DIAGNOSIS — E7800 Pure hypercholesterolemia, unspecified: Secondary | ICD-10-CM

## 2020-12-10 DIAGNOSIS — E6609 Other obesity due to excess calories: Secondary | ICD-10-CM

## 2020-12-10 DIAGNOSIS — E785 Hyperlipidemia, unspecified: Secondary | ICD-10-CM | POA: Diagnosis not present

## 2020-12-10 DIAGNOSIS — I10 Essential (primary) hypertension: Secondary | ICD-10-CM

## 2020-12-10 DIAGNOSIS — Z0000 Encounter for general adult medical examination without abnormal findings: Secondary | ICD-10-CM | POA: Diagnosis not present

## 2020-12-10 DIAGNOSIS — R011 Cardiac murmur, unspecified: Secondary | ICD-10-CM

## 2020-12-10 DIAGNOSIS — L723 Sebaceous cyst: Secondary | ICD-10-CM

## 2020-12-10 LAB — COMPREHENSIVE METABOLIC PANEL
ALT: 7 U/L (ref 0–35)
AST: 10 U/L (ref 0–37)
Albumin: 3.9 g/dL (ref 3.5–5.2)
Alkaline Phosphatase: 93 U/L (ref 39–117)
BUN: 11 mg/dL (ref 6–23)
CO2: 21 mEq/L (ref 19–32)
Calcium: 9.5 mg/dL (ref 8.4–10.5)
Chloride: 95 mEq/L — ABNORMAL LOW (ref 96–112)
Creatinine, Ser: 0.51 mg/dL (ref 0.40–1.20)
GFR: 101.84 mL/min (ref >60.00)
Glucose, Bld: 311 mg/dL — ABNORMAL HIGH (ref 70–99)
Potassium: 4.2 mEq/L (ref 3.5–5.1)
Sodium: 130 mEq/L — ABNORMAL LOW (ref 135–145)
Total Bilirubin: 0.6 mg/dL (ref 0.2–1.2)
Total Protein: 6.9 g/dL (ref 6.0–8.3)

## 2020-12-10 LAB — LDL CHOLESTEROL, DIRECT: Direct LDL: 137 mg/dL

## 2020-12-10 LAB — LIPID PANEL
Cholesterol: 438 mg/dL — ABNORMAL HIGH (ref 0–200)
HDL: 39.5 mg/dL (ref >39.00)
Total CHOL/HDL Ratio: 11
Triglycerides: 1490 mg/dL — ABNORMAL HIGH (ref 0.0–149.0)

## 2020-12-10 LAB — HEMOGLOBIN A1C: Hgb A1c MFr Bld: 14.4 % — ABNORMAL HIGH (ref 4.6–6.5)

## 2020-12-10 MED ORDER — LISINOPRIL-HYDROCHLOROTHIAZIDE 20-12.5 MG PO TABS: 1.0000 | ORAL_TABLET | Freq: Every day | ORAL | 2 refills | Status: DC

## 2020-12-10 MED ORDER — DOXYCYCLINE HYCLATE 100 MG PO TABS: 100.0000 mg | ORAL_TABLET | Freq: Two times a day (BID) | ORAL | 0 refills | Status: AC

## 2020-12-10 MED ORDER — ATORVASTATIN CALCIUM 40 MG PO TABS: 40.0000 mg | ORAL_TABLET | Freq: Every day | ORAL | 3 refills | Status: DC

## 2020-12-10 NOTE — Patient Instructions (Addendum)
A few things to remember from today's visit:  Routine general medical examination at a health care facility  Hyperlipidemia associated with type 2 diabetes mellitus ([place])  Essential hypertension  Need for influenza vaccination - Plan: Flu Vaccine QUAD 48mo+IM (Fluarix, Fluzone & Alfiuria Quad PF)  If you need refills please call your pharmacy. Do not use My Chart to request refills or for acute issues that need immediate attention.   Doxycycline for armpit abscess. Catherine Moon (long acting insulin) sample given to start at same dose of Toujeo until you get new prescriptions from your endocrinologist. Resume blood pressure med but one tab instead 2 tabs. Monitor blood pressure at home.  Please be sure medication list is accurate. If a new problem present, please set up appointment sooner than planned today. Health Maintenance, Female Adopting a healthy lifestyle and getting preventive care are important in promoting health and wellness. Ask your health care provider about: The right schedule for you to have regular tests and exams. Things you can do on your own to prevent diseases and keep yourself healthy. What should I know about diet, weight, and exercise? Eat a healthy diet  Eat a diet that includes plenty of vegetables, fruits, low-fat dairy products, and lean protein. Do not eat a lot of foods that are high in solid fats, added sugars, or sodium. Maintain a healthy weight Body mass index (BMI) is used to identify weight problems. It estimates body fat based on height and weight. Your health care provider can help determine your BMI and help you achieve or maintain a healthy weight. Get regular exercise Get regular exercise. This is one of the most important things you can do for your health. Most adults should: Exercise for at least 150 minutes each week. The exercise should increase your heart rate and make you sweat (moderate-intensity exercise). Do strengthening exercises at  least twice a week. This is in addition to the moderate-intensity exercise. Spend less time sitting. Even light physical activity can be beneficial. Watch cholesterol and blood lipids Have your blood tested for lipids and cholesterol at 59 years of age, then have this test every 5 years. Have your cholesterol levels checked more often if: Your lipid or cholesterol levels are high. You are older than 59 years of age. You are at high risk for heart disease. What should I know about cancer screening? Depending on your health history and family history, you may need to have cancer screening at various ages. This may include screening for: Breast cancer. Cervical cancer. Colorectal cancer. Skin cancer. Lung cancer. What should I know about heart disease, diabetes, and high blood pressure? Blood pressure and heart disease High blood pressure causes heart disease and increases the risk of stroke. This is more likely to develop in people who have high blood pressure readings or are overweight. Have your blood pressure checked: Every 3-5 years if you are 53-87 years of age. Every year if you are 65 years old or older. Diabetes Have regular diabetes screenings. This checks your fasting blood sugar level. Have the screening done: Once every three years after age 91 if you are at a normal weight and have a low risk for diabetes. More often and at a younger age if you are overweight or have a high risk for diabetes. What should I know about preventing infection? Hepatitis B If you have a higher risk for hepatitis B, you should be screened for this virus. Talk with your health care provider to find out if  you are at risk for hepatitis B infection. Hepatitis C Testing is recommended for: Everyone born from [birth year] through 1964. Anyone with known risk factors for hepatitis C. Sexually transmitted infections (STIs) Get screened for STIs, including gonorrhea and chlamydia, if: You are sexually active  and are younger than 59 years of age. You are older than 59 years of age and your health care provider tells you that you are at risk for this type of infection. Your sexual activity has changed since you were last screened, and you are at increased risk for chlamydia or gonorrhea. Ask your health care provider if you are at risk. Ask your health care provider about whether you are at high risk for HIV. Your health care provider may recommend a prescription medicine to help prevent HIV infection. If you choose to take medicine to prevent HIV, you should first get tested for HIV. You should then be tested every 3 months for as long as you are taking the medicine. Pregnancy If you are about to stop having your period (premenopausal) and you may become pregnant, seek counseling before you get pregnant. Take 400 to 800 micrograms (mcg) of folic acid every day if you become pregnant. Ask for birth control (contraception) if you want to prevent pregnancy. Osteoporosis and menopause Osteoporosis is a disease in which the bones lose minerals and strength with aging. This can result in bone fractures. If you are 33 years old or older, or if you are at risk for osteoporosis and fractures, ask your health care provider if you should: Be screened for bone loss. Take a calcium or vitamin D supplement to lower your risk of fractures. Be given hormone replacement therapy (HRT) to treat symptoms of menopause. Follow these instructions at home: Alcohol use Do not drink alcohol if: Your health care provider tells you not to drink. You are pregnant, may be pregnant, or are planning to become pregnant. If you drink alcohol: Limit how much you have to: 0-1 drink a day. Know how much alcohol is in your drink. In the [place] one drink equals one 12 oz bottle of beer (355 mL), one 5 oz glass of wine (148 mL), or one 1 oz glass of hard liquor (44 mL). Lifestyle Do not use any products that contain nicotine or tobacco.  These products include cigarettes, chewing tobacco, and vaping devices, such as e-cigarettes. If you need help quitting, ask your health care provider. Do not use street drugs. Do not share needles. Ask your health care provider for help if you need support or information about quitting drugs. General instructions Schedule regular health, dental, and eye exams. Stay current with your vaccines. Tell your health care provider if: You often feel depressed. You have ever been abused or do not feel safe at home. Summary Adopting a healthy lifestyle and getting preventive care are important in promoting health and wellness. Follow your health care provider's instructions about healthy diet, exercising, and getting tested or screened for diseases. Follow your health care provider's instructions on monitoring your cholesterol and blood pressure. This information is not intended to replace advice given to you by your health care provider. Make sure you discuss any questions you have with your health care provider. Document Revised: 05/12/2020 Document Reviewed: 05/12/2020 Elsevier Patient Education  Catherine Moon.

## 2020-12-10 NOTE — Assessment & Plan Note (Signed)
She has an appt with endocrinologist tomorrow. Tresiba samples given today to start at same Tojeo  dose she was taking.

## 2020-12-10 NOTE — Assessment & Plan Note (Signed)
Resume Atorvastatin 40 mg daily. Continue low fat diet.

## 2020-12-10 NOTE — Progress Notes (Signed)
HPI: Catherine Moon is a 60 y.o. female, who is here today for her routine physical.  Last CPE: 05/22/19  Regular exercise 3 or more time per week: She has not been consistent. Following a healthy diet: She has not been consistent. She lives with her husband.   Chronic medical problems: DM II,HLD,HTN,allergies,and adrenal hyperplasia among some. Sleeping about 8 hours.  Immunization History  Administered Date(s) Administered   Influenza Split 09/23/2011   Influenza Whole 12/18/2009   Influenza,inj,Quad PF,6+ Mos 09/25/2013, 11/01/2014, 01/15/2016, 10/12/2016, 12/07/2017, 12/10/2020   PFIZER(Purple Top)SARS-COV-2 Vaccination 03/29/2019, 04/24/2019   Pneumococcal Polysaccharide-23 12/18/2009, 01/15/2016   Td 02/04/2006   Tdap 05/09/2013   Unspecified SARS-COV-2 Vaccination 01/09/2020   Zoster Recombinat (Shingrix) 08/20/2018, 11/28/2018  Shingrix vaccination has been completed.  Health Maintenance  Topic Date Due   Pneumococcal Vaccine 65-76 Years old (2 - PCV) 01/14/2017   COLONOSCOPY (Pts 45-14yrs Insurance coverage will need to be confirmed)  02/16/2018   COVID-19 Vaccine (4 - Booster for Pfizer series) 03/05/2020   OPHTHALMOLOGY EXAM  01/21/2021   FOOT EXAM  03/05/2021   HEMOGLOBIN A1C  06/11/2021   MAMMOGRAM  08/07/2021   PAP SMEAR-Modifier  08/08/2022   TETANUS/TDAP  05/10/2023   INFLUENZA VACCINE  Completed   Hepatitis C Screening  Completed   HIV Screening  Completed   Zoster Vaccines- Shingrix  Completed   HPV VACCINES  Aged Out  Colonoscopy in 02/2015: Moderate diverticulosis 6 small polyps removed Internal and external hemorrhoids.  She sees Dr Carlis Abbott for her gyn preventive care. Last seen on 08/08/19.  Stopped because she could not afford them, did not have health insurance.  Hypertension:  Medications:She was on Lisinopril-HCTZ 20-12.5 mg bid. BP readings at home:Not checking. Side effects:None. Lab Results  Component Value Date    CREATININE 0.72 05/22/2019   BUN 21 05/22/2019   NA 136 05/22/2019   K 4.1 05/22/2019   CL 102 05/22/2019   CO2 29 05/22/2019   Hyperlipidemia: She was on Atorvastatin 40 mg daily. Side effects from medication:none Lab Results  Component Value Date   CHOL 175 05/22/2019   HDL 35.40 (L) 05/22/2019   LDLCALC 109 (H) 05/22/2019   LDLDIRECT 112.0 01/15/2016   TRIG 155.0 (H) 05/22/2019   CHOLHDL 5 05/22/2019   DM2: She follows with Dr. Loanne Drilling. She has not followed in over 6 months, has an appt tomorrow. Lab Results  Component Value Date   HGBA1C 6.1 (A) 03/05/2020   Right axilla bump for years but after COVID 19 infection ,a few months ago,she has noted purulent drainage intermittently. Erythematous, sore a couple weeks ago but better after draining. Negative for fever,chills,and myalgia.  Review of Systems  Constitutional:  Positive for fatigue. Negative for appetite change and fever.  HENT:  Negative for mouth sores and sore throat.   Eyes:  Negative for pain and visual disturbance.  Respiratory:  Negative for cough, shortness of breath and wheezing.   Cardiovascular:  Negative for chest pain and leg swelling.  Gastrointestinal:  Negative for abdominal pain, nausea and vomiting.       No changes in bowel habits.  Endocrine: Positive for polydipsia, polyphagia and polyuria. Negative for cold intolerance and heat intolerance.  Genitourinary:  Negative for decreased urine volume, dysuria, hematuria, vaginal bleeding and vaginal discharge.  Musculoskeletal:  Negative for gait problem and myalgias.  Skin:  Negative for color change and rash.  Allergic/Immunologic: Positive for environmental allergies.  Neurological:  Negative for syncope, weakness and  headaches.  Hematological:  Negative for adenopathy. Does not bruise/bleed easily.  Psychiatric/Behavioral:  Negative for confusion. The patient is not nervous/anxious.   All other systems reviewed and are negative.  Current  Outpatient Medications on File Prior to Visit  Medication Sig Dispense Refill   acetaminophen (TYLENOL) 500 MG tablet Take 1,000 mg by mouth every 6 (six) hours as needed for mild pain, moderate pain, fever or headache.     Ascorbic Acid (VITAMIN C) 1000 MG tablet Take 1,000 mg by mouth daily.     aspirin 81 MG EC tablet Take 81 mg by mouth daily.     Fexofenadine HCl (ALLEGRA PO) Take by mouth.     fluticasone (FLONASE) 50 MCG/ACT nasal spray Place 1 spray into both nostrils daily as needed for allergies or rhinitis. Reported on 07/11/2015     glucose blood (ONE TOUCH ULTRA TEST) test strip 1 each by Other route 3 (three) times daily. And lancets 3/day 300 each 3   Insulin Pen Needle 29G X 12MM MISC 1 each by Does not apply route 4 (four) times daily. 360 each 3   Loratadine (CLARITIN PO) Take by mouth.     magnesium oxide (MAG-OX) 400 MG tablet Take 400 mg by mouth daily.     Multiple Vitamin (MULTIVITAMIN WITH MINERALS) TABS tablet Take 1 tablet by mouth daily.     Potassium 99 MG TABS Take 1 tablet by mouth daily.     zinc gluconate 50 MG tablet Take 50 mg by mouth every other day.     No current facility-administered medications on file prior to visit.   Past Medical History:  Diagnosis Date   CTS (carpal tunnel syndrome)    DM (diabetes mellitus) (HCC)    Dyslipidemia    HTN (hypertension)    Hypercholesterolemia    Smoker    Warts, genital    Past Surgical History:  Procedure Laterality Date   COLONOSCOPY     DILATION AND CURETTAGE OF UTERUS     WISDOM TOOTH EXTRACTION     No Known Allergies  Family History  Problem Relation Age of Onset   Breast cancer Mother    Heart disease Mother    Cancer Mother        Breast   Coronary artery disease Father        starting in his 24s. A bypass and mitral infarction   Heart disease Father        Coronary Disease, bypass and mitral infarction   Colon cancer Paternal Grandmother    Social History   Socioeconomic History    Marital status: Married    Spouse name: Not on file   Number of children: Not on file   Years of education: Not on file   Highest education level: Not on file  Occupational History   Not on file  Tobacco Use   Smoking status: Former    Packs/day: 1.00    Years: 26.00    Pack years: 26.00    Types: Cigarettes    Quit date: 08/13/2007    Years since quitting: 13.3   Smokeless tobacco: Never   Tobacco comments:     She smoked one pack per day for 26 years but quit in 2009.  Substance and Sexual Activity   Alcohol use: No    Alcohol/week: 0.0 standard drinks   Drug use: No   Sexual activity: Not on file  Other Topics Concern   Not on file  Social History Narrative  Works 3rd shift in [employer], married.  She smoked one pack per day for 26 years but quit in 2009.         Social Determinants of Health   Financial Resource Strain: Not on file  Food Insecurity: Not on file  Transportation Needs: Not on file  Physical Activity: Not on file  Stress: Not on file  Social Connections: Not on file   Vitals:   12/10/20 1357  BP: 132/72  Pulse: 90  Resp: 16  Temp: 97.8 F (36.6 C)  SpO2: 98%   Body mass index is 30.38 kg/m.  Wt Readings from Last 3 Encounters:  12/10/20 188 lb 3.2 oz (85.4 kg)  03/05/20 250 lb (113.4 kg)  10/31/19 248 lb 6.4 oz (112.7 kg)   Physical Exam Vitals and nursing note reviewed.  Constitutional:      General: She is not in acute distress.    Appearance: She is well-developed.  HENT:     Head: Normocephalic and atraumatic.     Right Ear: Hearing, tympanic membrane, ear canal and external ear normal.     Left Ear: Hearing, tympanic membrane, ear canal and external ear normal.     Mouth/Throat:     Mouth: Mucous membranes are moist.     Pharynx: Oropharynx is clear. Uvula midline.  Eyes:     Extraocular Movements: Extraocular movements intact.     Conjunctiva/sclera: Conjunctivae normal.     Pupils: Pupils are equal, round, and  reactive to light.  Neck:     Thyroid: No thyromegaly.     Trachea: No tracheal deviation.  Cardiovascular:     Rate and Rhythm: Normal rate and regular rhythm.     Pulses:          Dorsalis pedis pulses are 2+ on the right side and 2+ on the left side.     Heart sounds: Murmur (SEM I-II/VI RUSB and LUSB) heard.  Pulmonary:     Effort: Pulmonary effort is normal. No respiratory distress.     Breath sounds: Normal breath sounds.  Abdominal:     Palpations: Abdomen is soft. There is no hepatomegaly or mass.     Tenderness: There is no abdominal tenderness.  Genitourinary:    Comments: Deferred to gyn. Musculoskeletal:     Right lower leg: 1+ Pitting Edema present.     Left lower leg: 1+ Pitting Edema present.     Comments: No major deformity or signs of synovitis appreciated.  Lymphadenopathy:     Cervical: No cervical adenopathy.     Upper Body:     Right upper body: No supraclavicular adenopathy.     Left upper body: No supraclavicular adenopathy.  Skin:    General: Skin is warm.     Findings: No erythema or rash.     Comments: Right axillar nodular erythematous lesion, purulent drainage, mildly tender.  Neurological:     General: No focal deficit present.     Mental Status: She is alert and oriented to person, place, and time.     Cranial Nerves: No cranial nerve deficit.     Coordination: Coordination normal.     Gait: Gait normal.     Deep Tendon Reflexes:     Reflex Scores:      Bicep reflexes are 2+ on the right side and 2+ on the left side.      Patellar reflexes are 2+ on the right side and 2+ on the left side. Psychiatric:  Speech: Speech normal.     Comments: Well groomed, good eye contact.   ASSESSMENT AND PLAN:  Catherine Moon was here today annual physical examination.  Orders Placed This Encounter  Procedures   Flu Vaccine QUAD 15mo+IM (Fluarix, Fluzone & Alfiuria Quad PF)   Comprehensive metabolic panel   Microalbumin / creatinine  urine ratio   Lipid panel   Hemoglobin A1c   LDL cholesterol, direct   Ambulatory referral to General Surgery   ECHOCARDIOGRAM COMPLETE   Lab Results  Component Value Date   CREATININE 0.51 12/10/2020   BUN 11 12/10/2020   NA 130 (L) 12/10/2020   K 4.2 12/10/2020   CL 95 (L) 12/10/2020   CO2 21 12/10/2020   Lab Results  Component Value Date   CHOL 438 (H) 12/10/2020   HDL 39.50 12/10/2020   LDLCALC 109 (H) 05/22/2019   LDLDIRECT 137.0 12/10/2020   TRIG (H) 12/10/2020    1490.0 Triglyceride is over 400; calculations on Lipids are invalid.   CHOLHDL 11 12/10/2020   Lab Results  Component Value Date   ALT 7 12/10/2020   AST 10 12/10/2020   ALKPHOS 93 12/10/2020   BILITOT 0.6 12/10/2020   Diabetes ([place]) She has an appt with endocrinologist tomorrow. Tresiba samples given today to start at same [place]  dose she was taking.  Essential hypertension BP adequately controlled today. Resume Lisinopril-HCTZ but lower dose. Monitor blood pressure at home.  Hyperlipidemia associated with type 2 diabetes mellitus (HCC) Resume Atorvastatin 40 mg daily. Continue low fat diet.  Routine general medical examination at a health care facility We discussed the importance of regular physical activity and healthy diet for prevention of chronic illness and/or complications. Preventive guidelines reviewed. Continue female preventive care with ehr gyn. Vaccination up to date. Ca++ and vit D supplementation recommended. Next CPE in a year.  Need for influenza vaccination -     Flu Vaccine QUAD 43mo+IM (Fluarix, Fluzone & Alfiuria Quad PF)  Abscess of axilla, right Intermittently draining for a few months, no fluctuant area to lance appreciated today. Doxycycline x 7 days. Continue local warm compresses.  -     doxycycline (VIBRA-TABS) 100 MG tablet; Take 1 tablet (100 mg total) by mouth 2 (two) times daily for 7 days.  Sebaceous cyst of axilla Educated about Dx and possible  complications. Surgery referral placed.  Heart murmur Heard today during examination, no prior hx. Echo will be arranged. Instructed about warning signs.  Class 1 obesity due to excess calories with serious comorbidity and body mass index (BMI) of 30.0 to 30.9 in adult She has lost some wt sine her last visit, which could be in part because poorly controlled DM. We discussed benefits of wt loss as well as adverse effects of obesity. Consistency with healthy diet and physical activity recommended.   Return in 5 months (on 05/10/2021).  Nawal Burling G. [place], MD  [facility]. [place] office.

## 2020-12-10 NOTE — Assessment & Plan Note (Signed)
BP adequately controlled today. Resume Lisinopril-HCTZ but lower dose. Monitor blood pressure at home.

## 2020-12-10 NOTE — Assessment & Plan Note (Addendum)
We discussed the importance of regular physical activity and healthy diet for prevention of chronic illness and/or complications. Preventive guidelines reviewed. Continue female preventive care with ehr gyn. Vaccination up to date. Ca++ and vit D supplementation recommended. Next CPE in a year.

## 2020-12-11 ENCOUNTER — Encounter: Payer: Self-pay | Admitting: Endocrinology

## 2020-12-11 ENCOUNTER — Ambulatory Visit: Payer: BC Managed Care – PPO | Admitting: Endocrinology

## 2020-12-11 VITALS — BP 150/64 | HR 100 | Ht 66.0 in | Wt 191.0 lb

## 2020-12-11 DIAGNOSIS — E119 Type 2 diabetes mellitus without complications: Secondary | ICD-10-CM

## 2020-12-11 DIAGNOSIS — Z794 Long term (current) use of insulin: Secondary | ICD-10-CM

## 2020-12-11 LAB — POCT GLYCOSYLATED HEMOGLOBIN (HGB A1C): Hemoglobin A1C: 14.6 % — AB (ref 4.0–5.6)

## 2020-12-11 LAB — MICROALBUMIN / CREATININE URINE RATIO
Creatinine,U: 25.4 mg/dL
Microalb Creat Ratio: 6.9 mg/g (ref 0.0–30.0)
Microalb, Ur: 1.8 mg/dL (ref 0.0–1.9)

## 2020-12-11 MED ORDER — HUMALOG KWIKPEN 200 UNIT/ML ~~LOC~~ SOPN: 60.0000 [IU] | PEN_INJECTOR | Freq: Three times a day (TID) | SUBCUTANEOUS | 3 refills | Status: DC

## 2020-12-11 MED ORDER — TOUJEO MAX SOLOSTAR 300 UNIT/ML ~~LOC~~ SOPN: 80.0000 [IU] | PEN_INJECTOR | Freq: Every day | SUBCUTANEOUS | 3 refills | Status: DC

## 2020-12-11 MED ORDER — TOUJEO MAX SOLOSTAR 300 UNIT/ML ~~LOC~~ SOPN: 20.0000 [IU] | PEN_INJECTOR | Freq: Every day | SUBCUTANEOUS | 3 refills | Status: DC

## 2020-12-11 MED ORDER — OZEMPIC (1 MG/DOSE) 2 MG/1.5ML ~~LOC~~ SOPN
1.0000 mg | PEN_INJECTOR | SUBCUTANEOUS | 3 refills | Status: DC
Start: 2020-12-11 — End: 2021-02-11

## 2020-12-11 MED ORDER — HUMALOG KWIKPEN 200 UNIT/ML ~~LOC~~ SOPN: 20.0000 [IU] | PEN_INJECTOR | Freq: Three times a day (TID) | SUBCUTANEOUS | 3 refills | Status: DC

## 2020-12-11 NOTE — Patient Instructions (Signed)
I have sent a prescription to your pharmacy, to reduce the insulins to the numbers listed below.   check your blood sugar twice a day.  vary the time of day when you check, between before the 3 meals, and at bedtime.  also check if you have symptoms of your blood sugar being too high or too low.  please keep a record of the readings and bring it to your next appointment here (or you can bring the meter itself).  You can write it on any piece of paper.  please call us sooner if your blood sugar goes below 70, or if you have a lot of readings over 200.   Please come back for a follow-up appointment in 2 months.

## 2020-12-11 NOTE — Progress Notes (Signed)
Subjective:    Patient ID: Catherine Moon, female    DOB: [birth year], 60 y.o.   MRN: [id number]  HPI Pt returns for f/u of diabetes mellitus: DM type: Insulin-requiring type 2 Dx'ed: 6283 Complications: none.  Therapy: insulin since 2010, and Ozempic.   GDM: never  DKA: never Severe hypoglycemia: never.   Pancreatitis: never.   Other: DM is characterized by severe insulin resistance; she takes multiple daily injections; she works 1st shift; she has dexcom G-6 continuous glucose monitor.   Interval history: Pt has been off her insulin x 9 mos, due to job loss.  She has since regained a job and insurance.  She has lost 59 lbs since last ov.    Past Medical History:  Diagnosis Date   CTS (carpal tunnel syndrome)    DM (diabetes mellitus) (HCC)    Dyslipidemia    HTN (hypertension)    Hypercholesterolemia    Smoker    Warts, genital     Past Surgical History:  Procedure Laterality Date   COLONOSCOPY     DILATION AND CURETTAGE OF UTERUS     WISDOM TOOTH EXTRACTION      Social History   Socioeconomic History   Marital status: Married    Spouse name: Not on file   Number of children: Not on file   Years of education: Not on file   Highest education level: Not on file  Occupational History   Not on file  Tobacco Use   Smoking status: Former    Packs/day: 1.00    Years: 26.00    Pack years: 26.00    Types: Cigarettes    Quit date: 08/13/2007    Years since quitting: 13.3   Smokeless tobacco: Never   Tobacco comments:     She smoked one pack per day for 26 years but quit in 2009.  Substance and Sexual Activity   Alcohol use: No    Alcohol/week: 0.0 standard drinks   Drug use: No   Sexual activity: Not on file  Other Topics Concern   Not on file  Social History Narrative   Works 3rd shift in [employer], married.  She smoked one pack per day for 26 years but quit in 2009.         Social Determinants of Health   Financial Resource Strain:  Not on file  Food Insecurity: Not on file  Transportation Needs: Not on file  Physical Activity: Not on file  Stress: Not on file  Social Connections: Not on file  Intimate Partner Violence: Not on file    Current Outpatient Medications on File Prior to Visit  Medication Sig Dispense Refill   acetaminophen (TYLENOL) 500 MG tablet Take 1,000 mg by mouth every 6 (six) hours as needed for mild pain, moderate pain, fever or headache.     Ascorbic Acid (VITAMIN C) 1000 MG tablet Take 1,000 mg by mouth daily.     aspirin 81 MG EC tablet Take 81 mg by mouth daily.     atorvastatin (LIPITOR) 40 MG tablet Take 1 tablet (40 mg total) by mouth daily. 90 tablet 3   doxycycline (VIBRA-TABS) 100 MG tablet Take 1 tablet (100 mg total) by mouth 2 (two) times daily for 7 days. 14 tablet 0   Fexofenadine HCl (ALLEGRA PO) Take by mouth.     fluticasone (FLONASE) 50 MCG/ACT nasal spray Place 1 spray into both nostrils daily as needed for allergies or rhinitis. Reported on 07/11/2015  glucose blood (ONE TOUCH ULTRA TEST) test strip 1 each by Other route 3 (three) times daily. And lancets 3/day 300 each 3   Insulin Pen Needle 29G X 12MM MISC 1 each by Does not apply route 4 (four) times daily. 360 each 3   lisinopril-hydrochlorothiazide (ZESTORETIC) 20-12.5 MG tablet Take 1 tablet by mouth daily. 90 tablet 2   Loratadine (CLARITIN PO) Take by mouth.     magnesium oxide (MAG-OX) 400 MG tablet Take 400 mg by mouth daily.     Multiple Vitamin (MULTIVITAMIN WITH MINERALS) TABS tablet Take 1 tablet by mouth daily.     Potassium 99 MG TABS Take 1 tablet by mouth daily.     zinc gluconate 50 MG tablet Take 50 mg by mouth every other day.     No current facility-administered medications on file prior to visit.    No Known Allergies  Family History  Problem Relation Age of Onset   Breast cancer Mother    Heart disease Mother    Cancer Mother        Breast   Coronary artery disease Father        starting in  his 41s. A bypass and mitral infarction   Heart disease Father        Coronary Disease, bypass and mitral infarction   Colon cancer Paternal Grandmother     BP (!) 150/64   Pulse 100   Ht 5\' 6"  (1.676 m)   Wt 191 lb (86.6 kg)   SpO2 96%   BMI 30.83 kg/m    Review of Systems     Objective:   Physical Exam  Lab Results  Component Value Date   CREATININE 0.51 12/10/2020   BUN 11 12/10/2020   NA 130 (L) 12/10/2020   K 4.2 12/10/2020   CL 95 (L) 12/10/2020   CO2 21 12/10/2020    Lab Results  Component Value Date   HGBA1C 14.4 (H) 12/10/2020      Assessment & Plan:  Insulin-requiring type 2 DM: uncontrolled off rx.   Patient Instructions  I have sent a prescription to your pharmacy, to reduce the insulins to the numbers listed below.   check your blood sugar twice a day.  vary the time of day when you check, between before the 3 meals, and at bedtime.  also check if you have symptoms of your blood sugar being too high or too low.  please keep a record of the readings and bring it to your next appointment here (or you can bring the meter itself).  You can write it on any piece of paper.  please call us sooner if your blood sugar goes below 70, or if you have a lot of readings over 200.   Please come back for a follow-up appointment in 2 months.

## 2020-12-12 ENCOUNTER — Other Ambulatory Visit: Payer: Self-pay

## 2020-12-12 ENCOUNTER — Telehealth: Payer: Self-pay

## 2020-12-12 ENCOUNTER — Other Ambulatory Visit (HOSPITAL_COMMUNITY): Payer: Self-pay

## 2020-12-12 MED ORDER — HUMALOG KWIKPEN 200 UNIT/ML ~~LOC~~ SOPN: 20.0000 [IU] | PEN_INJECTOR | Freq: Three times a day (TID) | SUBCUTANEOUS | 3 refills | Status: DC

## 2020-12-12 NOTE — Telephone Encounter (Signed)
Patient Advocate Encounter   Received notification from [facility] that prior authorization for Ozempic pen injector is required by his/her insurance [employer].   PA submitted on 12/12/2020  Key#: [id number]  Status is pending    [facility] will continue to follow:  Patient Advocate Fax:  [phone]

## 2020-12-13 ENCOUNTER — Encounter: Payer: Self-pay | Admitting: Endocrinology

## 2020-12-15 NOTE — Telephone Encounter (Signed)
Patient Advocate Encounter  Received fax from [facility] for more information. Recent office notes and A1C lab report was requested.   Faxed back to [phone] as requested.

## 2020-12-16 ENCOUNTER — Other Ambulatory Visit (HOSPITAL_COMMUNITY): Payer: Self-pay

## 2020-12-16 NOTE — Telephone Encounter (Signed)
Patient Advocate Encounter  Prior Authorization for [facility] 1MG  has been approved.    Effective dates: 12/15/20 through 12/15/22  Per Test Claim Patients co-pay is $0.   Spoke with Pharmacy to Process.  Pharmacy will order for pt.

## 2021-01-06 ENCOUNTER — Ambulatory Visit (HOSPITAL_COMMUNITY): Payer: BC Managed Care – PPO | Attending: Cardiovascular Disease

## 2021-01-06 ENCOUNTER — Other Ambulatory Visit: Payer: Self-pay

## 2021-01-06 DIAGNOSIS — R011 Cardiac murmur, unspecified: Secondary | ICD-10-CM | POA: Diagnosis not present

## 2021-01-06 LAB — ECHOCARDIOGRAM COMPLETE
AR max vel: 1.87 cm2
AV Area VTI: 1.96 cm2
AV Area mean vel: 1.84 cm2
AV Mean grad: 6.7 mmHg
AV Peak grad: 12.5 mmHg
Ao pk vel: 1.77 m/s
Area-P 1/2: 4.97 cm2
S' Lateral: 2.7 cm

## 2021-01-14 ENCOUNTER — Encounter: Payer: Self-pay | Admitting: Endocrinology

## 2021-01-14 DIAGNOSIS — Z794 Long term (current) use of insulin: Secondary | ICD-10-CM

## 2021-01-14 DIAGNOSIS — E119 Type 2 diabetes mellitus without complications: Secondary | ICD-10-CM

## 2021-01-15 ENCOUNTER — Other Ambulatory Visit: Payer: Self-pay | Admitting: Endocrinology

## 2021-01-15 MED ORDER — FREESTYLE LIBRE 2 SENSOR MISC: 1.0000 | 3 refills | Status: DC

## 2021-01-15 MED ORDER — FREESTYLE LIBRE 2 READER DEVI: 1.0000 | Freq: Once | 1 refills | Status: AC

## 2021-01-16 MED ORDER — DEXCOM G6 TRANSMITTER MISC: 3 refills | Status: DC

## 2021-01-16 MED ORDER — DEXCOM G6 RECEIVER DEVI: 0 refills | Status: DC

## 2021-01-16 MED ORDER — DEXCOM G6 SENSOR MISC: 3 refills | Status: DC

## 2021-01-23 ENCOUNTER — Other Ambulatory Visit: Payer: Self-pay | Admitting: Endocrinology

## 2021-01-23 ENCOUNTER — Encounter: Payer: Self-pay | Admitting: Endocrinology

## 2021-01-23 DIAGNOSIS — E119 Type 2 diabetes mellitus without complications: Secondary | ICD-10-CM

## 2021-01-26 ENCOUNTER — Telehealth: Payer: Self-pay

## 2021-01-26 ENCOUNTER — Other Ambulatory Visit (HOSPITAL_COMMUNITY): Payer: Self-pay

## 2021-01-26 NOTE — Telephone Encounter (Signed)
Patient Advocate Encounter   Received notification from patient calls that prior authorization for Dexcom G6 Transmitter is required by his/her insurance [employer].   PA submitted on 01/26/21  Key#: [id number]   Status is pending    [facility] will continue to follow:  Patient Advocate Fax: [phone]

## 2021-01-26 NOTE — Telephone Encounter (Signed)
Patient Advocate Encounter   Received notification from patient calls that prior authorization for Dexcom G6 Receiver is required by his/her insurance [employer].   PA submitted on 01/26/21  Key#: [id number]  Status is pending    [facility] will continue to follow:  Patient Advocate Fax: [phone]

## 2021-01-26 NOTE — Telephone Encounter (Signed)
Patient Advocate Encounter   Received notification from patient calls that prior authorization for Dexcom G6 Sensors is required by his/her insurance [employer].   PA submitted on 01/26/21  Key#: [id number]  Status is pending    [facility] will continue to follow:  Patient Advocate Fax: [phone]

## 2021-01-27 ENCOUNTER — Other Ambulatory Visit (HOSPITAL_COMMUNITY): Payer: Self-pay

## 2021-01-27 NOTE — Telephone Encounter (Signed)
Patient Advocate Encounter  Received notification from [employer] that the request for prior authorization for Dexcom has been denied due to not having a type 1 diabetes diagnosis.     Just says not a covered benefit for Type 2 diabetes.  [facility] Pharm[facility] Patient Advocate Fax:  [phone]

## 2021-01-30 ENCOUNTER — Other Ambulatory Visit (HOSPITAL_COMMUNITY): Payer: Self-pay

## 2021-02-11 ENCOUNTER — Other Ambulatory Visit: Payer: Self-pay

## 2021-02-11 ENCOUNTER — Ambulatory Visit: Payer: BC Managed Care – PPO | Admitting: Endocrinology

## 2021-02-11 VITALS — BP 130/70 | HR 95 | Ht 66.0 in | Wt 208.8 lb

## 2021-02-11 DIAGNOSIS — E119 Type 2 diabetes mellitus without complications: Secondary | ICD-10-CM | POA: Diagnosis not present

## 2021-02-11 DIAGNOSIS — Z794 Long term (current) use of insulin: Secondary | ICD-10-CM

## 2021-02-11 LAB — POCT GLYCOSYLATED HEMOGLOBIN (HGB A1C): Hemoglobin A1C: 9.8 % — AB (ref 4.0–5.6)

## 2021-02-11 MED ORDER — TOUJEO MAX SOLOSTAR 300 UNIT/ML ~~LOC~~ SOPN: 40.0000 [IU] | PEN_INJECTOR | Freq: Every day | SUBCUTANEOUS | 3 refills | Status: DC

## 2021-02-11 MED ORDER — HUMALOG KWIKPEN 200 UNIT/ML ~~LOC~~ SOPN: 30.0000 [IU] | PEN_INJECTOR | Freq: Three times a day (TID) | SUBCUTANEOUS | 3 refills | Status: DC

## 2021-02-11 MED ORDER — OZEMPIC (2 MG/DOSE) 8 MG/3ML ~~LOC~~ SOPN: 2.0000 mg | PEN_INJECTOR | SUBCUTANEOUS | 3 refills | Status: DC

## 2021-02-11 NOTE — Patient Instructions (Addendum)
I have sent prescriptions to your pharmacy, to change the insulins to the numbers listed below, and to increase the Ozempic.   check your blood sugar twice a day.  vary the time of day when you check, between before the 3 meals, and at bedtime.  also check if you have symptoms of your blood sugar being too high or too low.  please keep a record of the readings and bring it to your next appointment here (or you can bring the meter itself).  You can write it on any piece of paper.  please call us sooner if your blood sugar goes below 70, or if you have a lot of readings over 200.   Please come back for a follow-up appointment in 2 months.

## 2021-02-11 NOTE — Progress Notes (Signed)
Subjective:    Patient ID: Catherine Moon, female    DOB: 08/11/1960, 61 y.o.   MRN: [id number]  HPI Pt returns for f/u of diabetes mellitus: DM type: Insulin-requiring type 2 Dx'ed: 7893 Complications: none.  Therapy: insulin since 2010, and Ozempic.   GDM: never  DKA: never Severe hypoglycemia: never.   Pancreatitis: never.   SDOH: ins declines to refill continuous glucose monitor.   Other: DM is characterized by severe insulin resistance; she takes multiple daily injections; she works 2nd shift.   Interval history: back on insulin, pt states she feels well in general.  She has regained some weight.  no cbg record, but states cbg's varies from 79-402.  It is in general highest fasting, and lowest at HS.  Humalog is 35-40 units 3 times a day (just before each meal), and Toujeo is 20/d.   Past Medical History:  Diagnosis Date   CTS (carpal tunnel syndrome)    DM (diabetes mellitus) (HCC)    Dyslipidemia    HTN (hypertension)    Hypercholesterolemia    Smoker    Warts, genital     Past Surgical History:  Procedure Laterality Date   COLONOSCOPY     DILATION AND CURETTAGE OF UTERUS     WISDOM TOOTH EXTRACTION      Social History   Socioeconomic History   Marital status: Married    Spouse name: Not on file   Number of children: Not on file   Years of education: Not on file   Highest education level: Not on file  Occupational History   Not on file  Tobacco Use   Smoking status: Former    Packs/day: 1.00    Years: 26.00    Pack years: 26.00    Types: Cigarettes    Quit date: 08/13/2007    Years since quitting: 13.5   Smokeless tobacco: Never   Tobacco comments:     She smoked one pack per day for 26 years but quit in 2009.  Substance and Sexual Activity   Alcohol use: No    Alcohol/week: 0.0 standard drinks   Drug use: No   Sexual activity: Not on file  Other Topics Concern   Not on file  Social History Narrative   Works 3rd shift in [employer], married.  She smoked one pack per day for 26 years but quit in 2009.         Social Determinants of Health   Financial Resource Strain: Not on file  Food Insecurity: Not on file  Transportation Needs: Not on file  Physical Activity: Not on file  Stress: Not on file  Social Connections: Not on file  Intimate Partner Violence: Not on file    Current Outpatient Medications on File Prior to Visit  Medication Sig Dispense Refill   acetaminophen (TYLENOL) 500 MG tablet Take 1,000 mg by mouth every 6 (six) hours as needed for mild pain, moderate pain, fever or headache.     Ascorbic Acid (VITAMIN C) 1000 MG tablet Take 1,000 mg by mouth daily.     aspirin 81 MG EC tablet Take 81 mg by mouth daily.     atorvastatin (LIPITOR) 40 MG tablet Take 1 tablet (40 mg total) by mouth daily. 90 tablet 3   Fexofenadine HCl (ALLEGRA PO) Take by mouth.     fluticasone (FLONASE) 50 MCG/ACT nasal spray Place 1 spray into both nostrils daily as needed for allergies or rhinitis. Reported on 07/11/2015  glucose blood (ONE TOUCH ULTRA TEST) test strip 1 each by Other route 3 (three) times daily. And lancets 3/day 300 each 3   Insulin Pen Needle 29G X 12MM MISC 1 each by Does not apply route 4 (four) times daily. 360 each 3   lisinopril-hydrochlorothiazide (ZESTORETIC) 20-12.5 MG tablet Take 1 tablet by mouth daily. 90 tablet 2   Loratadine (CLARITIN PO) Take by mouth.     magnesium oxide (MAG-OX) 400 MG tablet Take 400 mg by mouth daily.     Multiple Vitamin (MULTIVITAMIN WITH MINERALS) TABS tablet Take 1 tablet by mouth daily.     Potassium 99 MG TABS Take 1 tablet by mouth daily.     zinc gluconate 50 MG tablet Take 50 mg by mouth every other day.     Continuous Blood Gluc Receiver (DEXCOM G6 RECEIVER) DEVI Use as instructed to check blood sugars 1 each 0   Continuous Blood Gluc Sensor (DEXCOM G6 SENSOR) MISC Use as instructed to check blood sugars, change every 10 days 9 each 3   Continuous  Blood Gluc Sensor (FREESTYLE LIBRE 2 SENSOR) MISC 1 Device by Does not apply route every 14 (fourteen) days. 6 each 3   Continuous Blood Gluc Transmit (DEXCOM G6 TRANSMITTER) MISC Use as instructed to check blood sugar, replace every 3 months 1 each 3   No current facility-administered medications on file prior to visit.    No Known Allergies  Family History  Problem Relation Age of Onset   Breast cancer Mother    Heart disease Mother    Cancer Mother        Breast   Coronary artery disease Father        starting in his 9s. A bypass and mitral infarction   Heart disease Father        Coronary Disease, bypass and mitral infarction   Colon cancer Paternal Grandmother     BP 130/70    Pulse 95    Ht 5\' 6"  (1.676 m)    Wt 208 lb 12.8 oz (94.7 kg)    SpO2 97%    BMI 33.70 kg/m    Review of Systems She denies hypoglycemia/N/V/HB/bloating.      Objective:   Physical Exam VITAL SIGNS:  See vs page.   GENERAL: no distress.      A1c=9.8%    Assessment & Plan:  Insulin-requiring type 2 DM: uncontrolled.   Patient Instructions  I have sent prescriptions to your pharmacy, to change the insulins to the numbers listed below, and to increase the Ozempic.   check your blood sugar twice a day.  vary the time of day when you check, between before the 3 meals, and at bedtime.  also check if you have symptoms of your blood sugar being too high or too low.  please keep a record of the readings and bring it to your next appointment here (or you can bring the meter itself).  You can write it on any piece of paper.  please call us sooner if your blood sugar goes below 70, or if you have a lot of readings over 200.   Please come back for a follow-up appointment in 2 months.

## 2021-02-20 NOTE — Progress Notes (Signed)
HPI: [name] is a 61 y.o. female, who is here today to follow on recent echocardiogram results and HLD. She was last seen on 12/10/21, at that time echo was ordered because heart murmur noted on examination. Since her last visit she has seen her endocrinologist and started on Ozempic.  She is not having CP,SOB,palpitations,PND,orthopnea,or LE edema. Echo was done on 01/06/21: LVEF 41-28%,NOMVE I diastolic dysfunction.Left atrium severely dilated.Trivial MR and TR, moderate calcification of aortic valve.  She started walking for 15 min almost daily and trying to follow a healthier diet. HLD on  Atorvastatin 40 mg daily. She is tolerating medication well.  Lab Results  Component Value Date   CHOL 438 (H) 12/10/2020   HDL 39.50 12/10/2020   LDLCALC 109 (H) 05/22/2019   LDLDIRECT 137.0 12/10/2020   TRIG (H) 12/10/2020    1490.0 Triglyceride is over 400; calculations on Lipids are invalid.   CHOLHDL 11 12/10/2020   HTN on Lisinopril-HCTZ 20-12.5 mg daily. Tolerating medication well.  Lab Results  Component Value Date   CREATININE 0.51 12/10/2020   BUN 11 12/10/2020   NA 130 (L) 12/10/2020   K 4.2 12/10/2020   CL 95 (L) 12/10/2020   CO2 21 12/10/2020   Review of Systems  Constitutional:  Negative for activity change, appetite change and fever.  HENT:  Negative for mouth sores and nosebleeds.   Eyes:  Negative for redness and visual disturbance.  Respiratory:  Negative for cough and wheezing.   Gastrointestinal:  Negative for abdominal pain, nausea and vomiting.       Negative for changes in bowel habits.  Genitourinary:  Negative for decreased urine volume and hematuria.  Neurological:  Negative for syncope, weakness and headaches.  Rest see pertinent positives and negatives per HPI.  Current Outpatient Medications on File Prior to Visit  Medication Sig Dispense Refill   acetaminophen (TYLENOL) 500 MG tablet Take 1,000 mg by mouth every 6 (six) hours as  needed for mild pain, moderate pain, fever or headache.     Ascorbic Acid (VITAMIN C) 1000 MG tablet Take 1,000 mg by mouth daily.     aspirin 81 MG EC tablet Take 81 mg by mouth daily.     atorvastatin (LIPITOR) 40 MG tablet Take 1 tablet (40 mg total) by mouth daily. 90 tablet 3   Fexofenadine HCl (ALLEGRA PO) Take by mouth.     fluticasone (FLONASE) 50 MCG/ACT nasal spray Place 1 spray into both nostrils daily as needed for allergies or rhinitis. Reported on 07/11/2015     glucose blood (ONE TOUCH ULTRA TEST) test strip 1 each by Other route 3 (three) times daily. And lancets 3/day 300 each 3   insulin glargine, 2 Unit Dial, (TOUJEO MAX SOLOSTAR) 300 UNIT/ML Solostar Pen Inject 40 Units into the skin at bedtime. 24 mL 3   insulin lispro (HUMALOG KWIKPEN) 200 UNIT/ML KwikPen Inject 30 Units into the skin 3 (three) times daily with meals. And pen needles 4/day 90 mL 3   Insulin Pen Needle 29G X 12MM MISC 1 each by Does not apply route 4 (four) times daily. 360 each 3   lisinopril-hydrochlorothiazide (ZESTORETIC) 20-12.5 MG tablet Take 1 tablet by mouth daily. 90 tablet 2   Loratadine (CLARITIN PO) Take by mouth.     magnesium oxide (MAG-OX) 400 MG tablet Take 400 mg by mouth daily.     Multiple Vitamin (MULTIVITAMIN WITH MINERALS) TABS tablet Take 1 tablet by mouth daily.     Potassium 99  MG TABS Take 1 tablet by mouth daily.     Semaglutide, 2 MG/DOSE, (OZEMPIC, 2 MG/DOSE,) 8 MG/3ML SOPN Inject 2 mg into the skin once a week. 9 mL 3   zinc gluconate 50 MG tablet Take 50 mg by mouth every other day.     No current facility-administered medications on file prior to visit.   Past Medical History:  Diagnosis Date   CTS (carpal tunnel syndrome)    DM (diabetes mellitus) (HCC)    Dyslipidemia    HTN (hypertension)    Hypercholesterolemia    Smoker    Warts, genital    No Known Allergies  Social History   Socioeconomic History   Marital status: Married    Spouse name: Not on file    Number of children: Not on file   Years of education: Not on file   Highest education level: Associate degree: academic program  Occupational History   Not on file  Tobacco Use   Smoking status: Former    Packs/day: 1.00    Years: 26.00    Pack years: 26.00    Types: Cigarettes    Quit date: 08/13/2007    Years since quitting: 13.5   Smokeless tobacco: Never   Tobacco comments:     She smoked one pack per day for 26 years but quit in 2009.  Substance and Sexual Activity   Alcohol use: No    Alcohol/week: 0.0 standard drinks   Drug use: No   Sexual activity: Not on file  Other Topics Concern   Not on file  Social History Narrative   Works 3rd shift in [employer], married.  She smoked one pack per day for 26 years but quit in 2009.         Social Determinants of Health   Financial Resource Strain: Low Risk    Difficulty of Paying Living Expenses: Not hard at all  Food Insecurity: No Food Insecurity   Worried About Charity fundraiser in the Last Year: Never true   [place] in the Last Year: Never true  Transportation Needs: No Transportation Needs   Lack of Transportation (Medical): No   Lack of Transportation (Non-Medical): No  Physical Activity: Insufficiently Active   Days of Exercise per Week: 4 days   Minutes of Exercise per Session: 20 min  Stress: Stress Concern Present   Feeling of Stress : To some extent  Social Connections: Unknown   Frequency of Communication with Friends and Family: Once a week   Frequency of Social Gatherings with Friends and Family: Patient refused   Attends Religious Services: Never   Marine scientist or Organizations: No   Attends Archivist Meetings: Not on file   Marital Status: Married   Vitals:   02/23/21 1056  BP: 126/80  Pulse: 92  Resp: 16  SpO2: 97%   Wt Readings from Last 3 Encounters:  02/23/21 208 lb (94.3 kg)  02/11/21 208 lb 12.8 oz (94.7 kg)  12/11/20 191 lb (86.6 kg)   Body  mass index is 33.57 kg/m.  Physical Exam Vitals and nursing note reviewed.  Constitutional:      General: She is not in acute distress.    Appearance: She is well-developed.  HENT:     Head: Normocephalic and atraumatic.     Mouth/Throat:     Mouth: Mucous membranes are moist.     Pharynx: Oropharynx is clear.  Eyes:     Conjunctiva/sclera:  Conjunctivae normal.  Cardiovascular:     Rate and Rhythm: Normal rate and regular rhythm.     Pulses:          Dorsalis pedis pulses are 2+ on the right side and 2+ on the left side.     Heart sounds: Murmur (SEM I/VI RUSB and LUSB) heard.  Pulmonary:     Effort: Pulmonary effort is normal. No respiratory distress.     Breath sounds: Normal breath sounds.  Abdominal:     Palpations: Abdomen is soft. There is no hepatomegaly or mass.     Tenderness: There is no abdominal tenderness.  Skin:    General: Skin is warm.     Findings: No erythema or rash.  Neurological:     General: No focal deficit present.     Mental Status: She is alert and oriented to person, place, and time.     Cranial Nerves: No cranial nerve deficit.     Gait: Gait normal.  Psychiatric:     Comments: Well groomed, good eye contact.   ASSESSMENT AND PLAN:  Ms.Sophiea was seen today for results.  Diagnoses and all orders for this visit: Orders Placed This Encounter  Procedures   EKG 12-Lead   Lab Results  Component Value Date   CHOL 196 02/23/2021   HDL 56.80 02/23/2021   LDLCALC 102 (H) 02/23/2021   LDLDIRECT 137.0 12/10/2020   TRIG 188.0 (H) 02/23/2021   CHOLHDL 3 02/23/2021   Lab Results  Component Value Date   ALT 13 02/23/2021   Hyperlipidemia associated with type 2 diabetes mellitus (HCC) Continue Atorvastatin 40 mg daily and low fat diet. Further recommendations according to FLP result.  Essential hypertension BP adequately controlled. Continue Lisinopril-HCTZ 20-12.5 mg daily and low salt diet. Monitor BP at home.  Left atrial  enlargement Noted on echo. No palpitations or symptoms suggestive or cardiac arrhythmia, pulmonary artery pressure normal. Could be related to chronic diastolic dysfunction. EKG today: SR, normal axis and intervals, LAE. Compared with EKG done in 07/2014 sinus tach is not longer present, rest with no significant changes.  Class 1 obesity due to excess calories with serious comorbidity and body mass index (BMI) of 30.0 to 30.9 in adult Encouraged consistency with healthy diet and physical activity. Ozempic will also help.  Chronic heart failure with preserved ejection fraction (HCC) We discussed echo findings. Continue low salt diet and adequate BP controlled.  Return in about 6 months (around 08/23/2021) for follow up.  Eugena Rhue G. Martin[place], MD  [facility]. [place] office.

## 2021-02-23 ENCOUNTER — Encounter: Payer: Self-pay | Admitting: Family Medicine

## 2021-02-23 ENCOUNTER — Ambulatory Visit: Payer: BC Managed Care – PPO | Admitting: Family Medicine

## 2021-02-23 VITALS — BP 126/80 | HR 92 | Resp 16 | Ht 66.0 in | Wt 208.0 lb

## 2021-02-23 DIAGNOSIS — I517 Cardiomegaly: Secondary | ICD-10-CM

## 2021-02-23 DIAGNOSIS — Z6830 Body mass index (BMI) 30.0-30.9, adult: Secondary | ICD-10-CM

## 2021-02-23 DIAGNOSIS — E785 Hyperlipidemia, unspecified: Secondary | ICD-10-CM

## 2021-02-23 DIAGNOSIS — I10 Essential (primary) hypertension: Secondary | ICD-10-CM

## 2021-02-23 DIAGNOSIS — E1169 Type 2 diabetes mellitus with other specified complication: Secondary | ICD-10-CM | POA: Diagnosis not present

## 2021-02-23 DIAGNOSIS — I5030 Unspecified diastolic (congestive) heart failure: Secondary | ICD-10-CM | POA: Insufficient documentation

## 2021-02-23 DIAGNOSIS — E6609 Other obesity due to excess calories: Secondary | ICD-10-CM | POA: Diagnosis not present

## 2021-02-23 DIAGNOSIS — I5032 Chronic diastolic (congestive) heart failure: Secondary | ICD-10-CM

## 2021-02-23 LAB — LIPID PANEL
Cholesterol: 196 mg/dL (ref 0–200)
HDL: 56.8 mg/dL (ref >39.00)
LDL Cholesterol: 102 mg/dL — ABNORMAL HIGH (ref 0–99)
NonHDL: 139.47
Total CHOL/HDL Ratio: 3
Triglycerides: 188 mg/dL — ABNORMAL HIGH (ref 0.0–149.0)
VLDL: 37.6 mg/dL (ref 0.0–40.0)

## 2021-02-23 LAB — ALT: ALT: 13 U/L (ref 0–35)

## 2021-02-23 NOTE — Patient Instructions (Signed)
A few things to remember from today's visit:   Hyperlipidemia associated with type 2 diabetes mellitus ([place]) - Plan: ALT, Lipid panel  Essential hypertension - Plan: EKG 12-Lead  Left atrial enlargement  If you need refills please call your pharmacy. Do not use My Chart to request refills or for acute issues that need immediate attention.   No changes today.  Please be sure medication list is accurate. If a new problem present, please set up appointment sooner than planned today.

## 2021-02-27 ENCOUNTER — Other Ambulatory Visit: Payer: Self-pay

## 2021-02-27 MED ORDER — ATORVASTATIN CALCIUM 80 MG PO TABS: 80.0000 mg | ORAL_TABLET | Freq: Every day | ORAL | 3 refills | Status: DC

## 2021-03-03 ENCOUNTER — Other Ambulatory Visit: Payer: Self-pay | Admitting: General Surgery

## 2021-03-17 NOTE — Progress Notes (Signed)
Called Catherine Moon at [facility] to inform Dr. Donne Hazel of patients most recent A1C. ?

## 2021-03-18 ENCOUNTER — Other Ambulatory Visit: Payer: Self-pay

## 2021-03-18 ENCOUNTER — Encounter (HOSPITAL_BASED_OUTPATIENT_CLINIC_OR_DEPARTMENT_OTHER): Payer: Self-pay | Admitting: General Surgery

## 2021-03-24 ENCOUNTER — Encounter (HOSPITAL_BASED_OUTPATIENT_CLINIC_OR_DEPARTMENT_OTHER)
Admission: RE | Admit: 2021-03-24 | Discharge: 2021-03-24 | Disposition: A | Discharge status: Home / Self Care | Payer: BC Managed Care – PPO | Source: Ambulatory Visit | Attending: General Surgery | Admitting: General Surgery

## 2021-03-24 DIAGNOSIS — Z87891 Personal history of nicotine dependence: Secondary | ICD-10-CM | POA: Diagnosis not present

## 2021-03-24 DIAGNOSIS — Z8616 Personal history of COVID-19: Secondary | ICD-10-CM | POA: Diagnosis not present

## 2021-03-24 DIAGNOSIS — E119 Type 2 diabetes mellitus without complications: Secondary | ICD-10-CM | POA: Insufficient documentation

## 2021-03-24 DIAGNOSIS — Z01812 Encounter for preprocedural laboratory examination: Secondary | ICD-10-CM | POA: Insufficient documentation

## 2021-03-24 DIAGNOSIS — Z794 Long term (current) use of insulin: Secondary | ICD-10-CM | POA: Insufficient documentation

## 2021-03-24 DIAGNOSIS — L720 Epidermal cyst: Secondary | ICD-10-CM | POA: Diagnosis present

## 2021-03-24 DIAGNOSIS — I10 Essential (primary) hypertension: Secondary | ICD-10-CM | POA: Diagnosis not present

## 2021-03-24 LAB — BASIC METABOLIC PANEL
Anion gap: 8 (ref 5–15)
BUN: 19 mg/dL (ref 6–20)
CO2: 26 mmol/L (ref 22–32)
Calcium: 9.2 mg/dL (ref 8.9–10.3)
Chloride: 102 mmol/L (ref 98–111)
Creatinine, Ser: 0.76 mg/dL (ref 0.44–1.00)
GFR, Estimated: >60 mL/min (ref >60)
Glucose, Bld: 285 mg/dL — ABNORMAL HIGH (ref 70–99)
Potassium: 4.2 mmol/L (ref 3.5–5.1)
Sodium: 136 mmol/L (ref 135–145)

## 2021-03-24 MED ORDER — ENSURE PRE-SURGERY PO LIQD: 296.0000 mL | Freq: Once | ORAL | Status: DC

## 2021-03-24 NOTE — Progress Notes (Signed)

## 2021-03-25 NOTE — Progress Notes (Signed)
Chart reviewed with Dr. Valma Cava, okay to proceed with surgery. Take CBG day of surgery when patient arrives.  ?

## 2021-03-25 NOTE — Anesthesia Preprocedure Evaluation (Addendum)
Anesthesia Evaluation  ?Patient identified by MRN, date of birth, ID band ?Patient awake ? ? ? ?Reviewed: ?Allergy & Precautions, H&P , NPO status , Patient's Chart, lab work & pertinent test results ? ?Airway ?Mallampati: II ? ?TM Distance: >3 FB ?Neck ROM: Full ? ? ? Dental ?  ?Pulmonary ?neg pulmonary ROS, former smoker,  ?  ?Pulmonary exam normal ? ? ? ? ? ? ? Cardiovascular ?Exercise Tolerance: Good ?hypertension, Pt. on medications ?Normal cardiovascular exam ? ? ?  ?Neuro/Psych ?negative neurological ROS ? negative psych ROS  ? GI/Hepatic ?negative GI ROS, Neg liver ROS,   ?Endo/Other  ?diabetes ? Renal/GU ?negative Renal ROS  ?negative genitourinary ?  ?Musculoskeletal ? ? Abdominal ?  ?Peds ? Hematology ?negative hematology ROS ?(+)   ?Anesthesia Other Findings ?ECHO 1/23 ?1. Left ventricular ejection fraction, by estimation, is 60 to 65%. The  ?left ventricle has normal function. The left ventricle has no regional  ?wall motion abnormalities. Left ventricular diastolic parameters are  ?consistent with Grade I diastolic  ?dysfunction (impaired relaxation).  ??2. Right ventricular systolic function is normal. The right ventricular  ?size is normal. There is normal pulmonary artery systolic pressure.  ??3. Left atrial size was severely dilated.  ??4. The mitral valve is normal in structure. Trivial mitral valve  ?regurgitation. No evidence of mitral stenosis.  ??5. 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 The aortic valve is calcified. There is  ?moderate calcification of the aortic valve. There is moderate thickening  ?of the aortic valve. Aortic valve  ?regurgitation is trivial. No aortic stenosis is present. Aortic valve  ?area, by VTI measures 1.96 cm?Marland Kitchen Aortic valve mean gradient measures 6.7  ?mmHg. Aortic valve Vmax measures 1.77 m/s.  ??6. The inferior vena cava is normal in size with greater than 50%   ?respiratory variability, suggesting right atrial pressure of 3 mmHg.  ? Reproductive/Obstetrics ?negative OB ROS ? ?  ? ? ? ? ? ? ? ? ? ? ? ? ? ?  ?  ? ? ? ? ? ? ? ?Anesthesia Physical ?Anesthesia Plan ? ?ASA: 2 ? ?Anesthesia Plan: MAC  ? ?Post-op Pain Management: Minimal or no pain anticipated  ? ?Induction: Intravenous ? ?PONV Risk Score and Plan: 2 and Propofol infusion ? ?Airway Management Planned: Natural Airway and Nasal Cannula ? ?Additional Equipment: None ? ?Intra-op Plan:  ? ?Post-operative Plan:  ? ?Informed Consent: I have reviewed the patients History and Physical, chart, labs and discussed the procedure including the risks, benefits and alternatives for the proposed anesthesia with the patient or authorized representative who has indicated his/her understanding and acceptance.  ? ? ? ? ? ?Plan Discussed with: Anesthesiologist and CRNA ? ?Anesthesia Plan Comments:   ? ? ? ? ? ?Anesthesia Quick Evaluation ? ?

## 2021-03-26 ENCOUNTER — Encounter (HOSPITAL_BASED_OUTPATIENT_CLINIC_OR_DEPARTMENT_OTHER)
Admission: RE | Disposition: A | Discharge status: Home / Self Care | Payer: Self-pay | Source: Home / Self Care | Attending: General Surgery

## 2021-03-26 ENCOUNTER — Ambulatory Visit (HOSPITAL_BASED_OUTPATIENT_CLINIC_OR_DEPARTMENT_OTHER): Payer: BC Managed Care – PPO | Admitting: Anesthesiology

## 2021-03-26 ENCOUNTER — Encounter (HOSPITAL_BASED_OUTPATIENT_CLINIC_OR_DEPARTMENT_OTHER): Payer: Self-pay | Admitting: General Surgery

## 2021-03-26 ENCOUNTER — Ambulatory Visit (HOSPITAL_BASED_OUTPATIENT_CLINIC_OR_DEPARTMENT_OTHER)
Admission: RE | Admit: 2021-03-26 | Discharge: 2021-03-26 | Disposition: A | Discharge status: Home / Self Care | Payer: BC Managed Care – PPO | Attending: General Surgery | Admitting: General Surgery

## 2021-03-26 ENCOUNTER — Other Ambulatory Visit: Payer: Self-pay

## 2021-03-26 DIAGNOSIS — I10 Essential (primary) hypertension: Secondary | ICD-10-CM | POA: Insufficient documentation

## 2021-03-26 DIAGNOSIS — Z8616 Personal history of COVID-19: Secondary | ICD-10-CM | POA: Insufficient documentation

## 2021-03-26 DIAGNOSIS — Z794 Long term (current) use of insulin: Secondary | ICD-10-CM | POA: Insufficient documentation

## 2021-03-26 DIAGNOSIS — E119 Type 2 diabetes mellitus without complications: Secondary | ICD-10-CM | POA: Insufficient documentation

## 2021-03-26 DIAGNOSIS — Z87891 Personal history of nicotine dependence: Secondary | ICD-10-CM | POA: Insufficient documentation

## 2021-03-26 DIAGNOSIS — L720 Epidermal cyst: Secondary | ICD-10-CM | POA: Insufficient documentation

## 2021-03-26 HISTORY — DX: Other seasonal allergic rhinitis: J30.2

## 2021-03-26 HISTORY — DX: Cardiac murmur, unspecified: R01.1

## 2021-03-26 HISTORY — PX: CYST EXCISION: SHX5701

## 2021-03-26 LAB — GLUCOSE, CAPILLARY
Glucose-Capillary: 243 mg/dL — ABNORMAL HIGH (ref 70–99)
Glucose-Capillary: 223 mg/dL — ABNORMAL HIGH (ref 70–99)

## 2021-03-26 SURGERY — CYST REMOVAL
Anesthesia: Monitor Anesthesia Care | Site: Axilla | Laterality: Right

## 2021-03-26 MED ORDER — ONDANSETRON HCL 4 MG/2ML IJ SOLN: 4.0000 mg | Freq: Once | INTRAMUSCULAR | Status: DC | PRN

## 2021-03-26 MED ORDER — LIDOCAINE HCL 1 % IJ SOLN
INTRAMUSCULAR | Status: DC | PRN
  Administered 2021-03-26: 50 mg via INTRADERMAL

## 2021-03-26 MED ORDER — CEFAZOLIN SODIUM-DEXTROSE 2-4 GM/100ML-% IV SOLN
2.0000 g | INTRAVENOUS | Status: AC
  Administered 2021-03-26: 2 g via INTRAVENOUS

## 2021-03-26 MED ORDER — CHLORHEXIDINE GLUCONATE CLOTH 2 % EX PADS: 6.0000 | MEDICATED_PAD | Freq: Once | CUTANEOUS | Status: DC

## 2021-03-26 MED ORDER — ONDANSETRON HCL 4 MG/2ML IJ SOLN
INTRAMUSCULAR | Status: DC | PRN
  Administered 2021-03-26: 4 mg via INTRAVENOUS

## 2021-03-26 MED ORDER — LIDOCAINE-EPINEPHRINE (PF) 1 %-1:200000 IJ SOLN
INTRAMUSCULAR | Status: DC | PRN
  Administered 2021-03-26: 28 mL via SUBCUTANEOUS

## 2021-03-26 MED ORDER — ACETAMINOPHEN 160 MG/5ML PO SOLN: 325.0000 mg | ORAL | Status: DC | PRN

## 2021-03-26 MED ORDER — CEFAZOLIN SODIUM-DEXTROSE 2-4 GM/100ML-% IV SOLN
INTRAVENOUS | Status: AC
  Filled 2021-03-26: qty 100

## 2021-03-26 MED ORDER — PROPOFOL 10 MG/ML IV BOLUS
INTRAVENOUS | Status: DC | PRN
  Administered 2021-03-26 (×2): 10 mg via INTRAVENOUS

## 2021-03-26 MED ORDER — DEXAMETHASONE SODIUM PHOSPHATE 10 MG/ML IJ SOLN
INTRAMUSCULAR | Status: AC
  Filled 2021-03-26: qty 1

## 2021-03-26 MED ORDER — MIDAZOLAM HCL 5 MG/5ML IJ SOLN
INTRAMUSCULAR | Status: DC | PRN
Start: 2021-03-26 — End: 2021-03-26
  Administered 2021-03-26 (×2): 1 mg via INTRAVENOUS

## 2021-03-26 MED ORDER — ONDANSETRON HCL 4 MG/2ML IJ SOLN
INTRAMUSCULAR | Status: AC
  Filled 2021-03-26: qty 2

## 2021-03-26 MED ORDER — PROPOFOL 500 MG/50ML IV EMUL
INTRAVENOUS | Status: AC
  Filled 2021-03-26: qty 50

## 2021-03-26 MED ORDER — ACETAMINOPHEN 500 MG PO TABS
ORAL_TABLET | ORAL | Status: AC
  Filled 2021-03-26: qty 2

## 2021-03-26 MED ORDER — PROPOFOL 500 MG/50ML IV EMUL
INTRAVENOUS | Status: DC | PRN
  Administered 2021-03-26: 50 ug/kg/min via INTRAVENOUS

## 2021-03-26 MED ORDER — DEXAMETHASONE SODIUM PHOSPHATE 10 MG/ML IJ SOLN
INTRAMUSCULAR | Status: DC | PRN
  Administered 2021-03-26: 4 mg via INTRAVENOUS

## 2021-03-26 MED ORDER — OXYCODONE HCL 5 MG/5ML PO SOLN: 5.0000 mg | Freq: Once | ORAL | Status: DC | PRN

## 2021-03-26 MED ORDER — ACETAMINOPHEN 500 MG PO TABS
1000.0000 mg | ORAL_TABLET | ORAL | Status: AC
Start: 2021-03-26 — End: 2021-03-26
  Administered 2021-03-26: 1000 mg via ORAL

## 2021-03-26 MED ORDER — PROPOFOL 10 MG/ML IV BOLUS
INTRAVENOUS | Status: AC
  Filled 2021-03-26: qty 20

## 2021-03-26 MED ORDER — MEPERIDINE HCL 25 MG/ML IJ SOLN: 6.2500 mg | INTRAMUSCULAR | Status: DC | PRN

## 2021-03-26 MED ORDER — LACTATED RINGERS IV SOLN: INTRAVENOUS | Status: DC

## 2021-03-26 MED ORDER — FENTANYL CITRATE (PF) 100 MCG/2ML IJ SOLN: 25.0000 ug | INTRAMUSCULAR | Status: DC | PRN

## 2021-03-26 MED ORDER — LIDOCAINE 2% (20 MG/ML) 5 ML SYRINGE
INTRAMUSCULAR | Status: AC
Start: 2021-03-26 — End: ?
  Filled 2021-03-26: qty 5

## 2021-03-26 MED ORDER — ACETAMINOPHEN 325 MG PO TABS: 325.0000 mg | ORAL_TABLET | ORAL | Status: DC | PRN

## 2021-03-26 MED ORDER — MIDAZOLAM HCL 2 MG/2ML IJ SOLN
INTRAMUSCULAR | Status: AC
  Filled 2021-03-26: qty 2

## 2021-03-26 MED ORDER — FENTANYL CITRATE (PF) 100 MCG/2ML IJ SOLN
INTRAMUSCULAR | Status: AC
  Filled 2021-03-26: qty 2

## 2021-03-26 MED ORDER — OXYCODONE HCL 5 MG PO TABS: 5.0000 mg | ORAL_TABLET | Freq: Once | ORAL | Status: DC | PRN

## 2021-03-26 MED ORDER — FENTANYL CITRATE (PF) 100 MCG/2ML IJ SOLN
INTRAMUSCULAR | Status: DC | PRN
  Administered 2021-03-26: 25 ug via INTRAVENOUS
  Administered 2021-03-26: 50 ug via INTRAVENOUS
  Administered 2021-03-26: 25 ug via INTRAVENOUS

## 2021-03-26 MED ORDER — LIDOCAINE-EPINEPHRINE 1 %-1:200000 IJ SOLN
INTRAMUSCULAR | Status: AC
Start: 2021-03-26 — End: ?
  Filled 2021-03-26: qty 30

## 2021-03-26 SURGICAL SUPPLY — 48 items
ADH SKN CLS APL DERMABOND .7 (GAUZE/BANDAGES/DRESSINGS) ×1
APL PRP STRL LF DISP 70% ISPRP (MISCELLANEOUS) ×1
BLADE CLIPPER SURG (BLADE) IMPLANT
BLADE SURG 15 STRL LF DISP TIS (BLADE) ×2 IMPLANT
BLADE SURG 15 STRL SS (BLADE) ×2
CANISTER SUCT 1200ML W/VALVE (MISCELLANEOUS) IMPLANT
CHLORAPREP W/TINT 26 (MISCELLANEOUS) ×3 IMPLANT
COVER BACK TABLE 60X90IN (DRAPES) ×3 IMPLANT
COVER MAYO STAND STRL (DRAPES) ×3 IMPLANT
DERMABOND ADVANCED (GAUZE/BANDAGES/DRESSINGS) ×1
DERMABOND ADVANCED .7 DNX12 (GAUZE/BANDAGES/DRESSINGS) ×2 IMPLANT
DRAPE LAPAROTOMY 100X72 PEDS (DRAPES) ×3 IMPLANT
DRSG TEGADERM 4X4.75 (GAUZE/BANDAGES/DRESSINGS) ×1 IMPLANT
ELECT COATED BLADE 2.86 ST (ELECTRODE) ×1 IMPLANT
ELECT REM PT RETURN 9FT ADLT (ELECTROSURGICAL) ×2
ELECTRODE REM PT RTRN 9FT ADLT (ELECTROSURGICAL) ×2 IMPLANT
GAUZE 4X4 16PLY ~~LOC~~+RFID DBL (SPONGE) ×1 IMPLANT
GAUZE PACKING IODOFORM 1/4X15 (PACKING) IMPLANT
GAUZE SPONGE 4X4 12PLY STRL LF (GAUZE/BANDAGES/DRESSINGS) IMPLANT
GLOVE SURG ENC MOIS LTX SZ7 (GLOVE) ×3 IMPLANT
GLOVE SURG POLYISO LF SZ6.5 (GLOVE) ×1 IMPLANT
GLOVE SURG UNDER POLY LF SZ7 (GLOVE) ×2 IMPLANT
GLOVE SURG UNDER POLY LF SZ7.5 (GLOVE) ×3 IMPLANT
GOWN STRL REUS W/ TWL LRG LVL3 (GOWN DISPOSABLE) ×6 IMPLANT
GOWN STRL REUS W/TWL LRG LVL3 (GOWN DISPOSABLE) ×8
NDL HYPO 25X1 1.5 SAFETY (NEEDLE) ×2 IMPLANT
NEEDLE HYPO 25X1 1.5 SAFETY (NEEDLE) ×2 IMPLANT
NS IRRIG 1000ML POUR BTL (IV SOLUTION) IMPLANT
PACK BASIN DAY SURGERY FS (CUSTOM PROCEDURE TRAY) ×3 IMPLANT
PENCIL SMOKE EVACUATOR (MISCELLANEOUS) ×3 IMPLANT
SPIKE FLUID TRANSFER (MISCELLANEOUS) IMPLANT
STRIP CLOSURE SKIN 1/2X4 (GAUZE/BANDAGES/DRESSINGS) ×2 IMPLANT
SUT ETHILON 2 0 FS 18 (SUTURE) IMPLANT
SUT MNCRL AB 4-0 PS2 18 (SUTURE) ×3 IMPLANT
SUT SILK 2 0 SH (SUTURE) IMPLANT
SUT VIC AB 2-0 SH 27 (SUTURE) ×2
SUT VIC AB 2-0 SH 27XBRD (SUTURE) IMPLANT
SUT VIC AB 3-0 SH 27 (SUTURE) ×2
SUT VIC AB 3-0 SH 27X BRD (SUTURE) IMPLANT
SUT VICRYL 3-0 CR8 SH (SUTURE) IMPLANT
SUT VICRYL 4-0 PS2 18IN ABS (SUTURE) IMPLANT
SWAB COLLECTION DEVICE MRSA (MISCELLANEOUS) IMPLANT
SWAB CULTURE ESWAB REG 1ML (MISCELLANEOUS) IMPLANT
SYR CONTROL 10ML LL (SYRINGE) ×3 IMPLANT
TOWEL GREEN STERILE FF (TOWEL DISPOSABLE) ×3 IMPLANT
TUBE CONNECTING 20X1/4 (TUBING) IMPLANT
UNDERPAD 30X36 HEAVY ABSORB (UNDERPADS AND DIAPERS) ×1 IMPLANT
YANKAUER SUCT BULB TIP NO VENT (SUCTIONS) IMPLANT

## 2021-03-26 NOTE — H&P (Signed)
?61 y.o. female with a history of diabetes which she is working on getting better controlled right now. She has a history of a right axillary cyst that has been stable for many years. However recent past after she had COVID in September she has had a lot of drainage out of this. This has finally stopped but she has been on antibiotics and she has had a fair amount of drainage out of the cyst. She is here to discuss her options today. ?She is not up-to-date on her mammogram at this point. ? ?Review of Systems: ?A complete review of systems was obtained from the patient. I have reviewed this information and discussed as appropriate with the patient. See HPI as well for other ROS. ? ?Review of Systems  ?HENT: Positive for congestion.  ? ? ?Medical History: ?Past Medical History:  ?Diagnosis Date  ? Diabetes mellitus without complication (CMS-HCC)  ? Hypertension  ? ?History reviewed. No pertinent surgical history.  ? ?No Known Allergies ? ?Current Outpatient Medications on File Prior to Visit  ?Medication Sig Dispense Refill  ? atorvastatin (LIPITOR) 40 MG tablet atorvastatin 40 mg tablet ?TAKE 1 TABLET BY MOUTH EVERY DAY  ? insulin glargine U-300 conc (TOUJEO MAX U-300 SOLOSTAR) 300 unit/mL (3 mL) InPn Toujeo Max U-300 SoloStar 300 unit/mL (3 mL) subcutaneous insulin pen ?ADMINISTER 130 UNITS UNDER THE SKIN AT BEDTIME  ? lisinopriL-hydrochlorothiazide (ZESTORETIC) 20-12.5 mg tablet lisinopril 20 mg-hydrochlorothiazide 12.5 mg tablet ?TAKE 2 TABLETS BY MOUTH EVERY DAY  ? semaglutide (OZEMPIC) 1 mg/dose (4 mg/3 mL) pen injector Ozempic 1 mg/dose (4 mg/3 mL) subcutaneous pen injector ?INJECT '1MG'$  INTO THE SKIN ONCE WEEKLY  ? ?Family History  ?Problem Relation Age of Onset  ? High blood pressure (Hypertension) Mother  ? Hyperlipidemia (Elevated cholesterol) Mother  ? Deep vein thrombosis (DVT or abnormal blood clot formation) Mother  ? Breast cancer Mother  ? Hyperlipidemia (Elevated cholesterol) Father  ? High blood  pressure (Hypertension) Father  ? High blood pressure (Hypertension) Sister  ? Hyperlipidemia (Elevated cholesterol) Sister  ? Coronary Artery Disease (Blocked arteries around heart) Brother  ? Deep vein thrombosis (DVT or abnormal blood clot formation) Brother  ? Hyperlipidemia (Elevated cholesterol) Brother  ? High blood pressure (Hypertension) Brother  ? ? ?Social History  ? ?Tobacco Use  ?Smoking Status Former  ? Types: Cigarettes  ? Quit date: 2009  ? Years since quitting: 14.1  ?Smokeless Tobacco Never  ? ?Social History  ? ?Socioeconomic History  ? Marital status: Married  ?Tobacco Use  ? Smoking status: Former  ?Types: Cigarettes  ?Quit date: 2009  ?Years since quitting: 14.1  ? Smokeless tobacco: Never  ?Substance and Sexual Activity  ? Alcohol use: Not Currently  ? Drug use: Not Currently  ? ?Objective:  ? ?Body mass index is 33.77 kg/m?. ? ?Physical Exam ?Constitutional:  ?Appearance: Normal appearance.  ?Chest:  ?Comments: Right axilla with 1x2 cm subq mass c/w likely sebaceous cyst, not currently infected ?Lymphadenopathy:  ?Upper Body:  ?Right upper body: No axillary adenopathy.  ?Neurological:  ?Mental Status: She is alert.  ? ? ?Assessment and Plan:  ? ?Sebaceous cyst of right axilla ? ?We discussed her options today. We discussed observation versus excision. Due to the fact that she has began having symptoms related to this I think the safest thing to do would be to excise this. We discussed excision under monitored anesthesia care. We discussed risks as well as recovery. Were going to proceed likely in March per her  schedule. ?

## 2021-03-26 NOTE — Anesthesia Postprocedure Evaluation (Signed)
Anesthesia Post Note ? ?Patient: Catherine Moon ? ?Procedure(s) Performed: RIGHT AXILLARY SEBACEOUS CYST EXCISION (Right: Axilla) ? ?  ? ?Patient location during evaluation: PACU ?Anesthesia Type: MAC ?Level of consciousness: awake and alert ?Pain management: pain level controlled ?Vital Signs Assessment: post-procedure vital signs reviewed and stable ?Respiratory status: spontaneous breathing, nonlabored ventilation, respiratory function stable and patient connected to nasal cannula oxygen ?Cardiovascular status: stable and blood pressure returned to baseline ?Postop Assessment: no apparent nausea or vomiting ?Anesthetic complications: no ? ? ?No notable events documented. ? ?Last Vitals:  ?Vitals:  ? 03/26/21 1245 03/26/21 1328  ?BP: 137/65 136/69  ?Pulse: 93 93  ?Resp: 11 20  ?Temp:  36.6 ?C  ?SpO2: 92% 94%  ?  ?Last Pain:  ?Vitals:  ? 03/26/21 1328  ?TempSrc: Oral  ?PainSc: 0-No pain  ? ? ?  ?  ?  ?  ?  ?  ? ?[name] ? ? ? ? ?

## 2021-03-26 NOTE — Discharge Instructions (Addendum)
[facility] ?Office Phone Number [phone] ? ? POST OP INSTRUCTIONS ?Take 400 mg of ibuprofen every 8 hours or 650 mg tylenol every 6 hours for next 72 hours then as needed. Use ice several times daily also. ? ? ?You may take acetaminophen (Tylenol), naprosyn (Alleve) or ibuprofen (Advil) as needed. ?Take your usually prescribed medications unless otherwise directed ?You should eat very light the first 24 hours after surgery, such as soup, crackers, pudding, etc.  Resume your normal diet the day after surgery. ?Most patients will experience some swelling and bruising .  Ice packs will help.  Swelling and bruising can take several days to resolve.  ?It is common to experience some constipation if taking pain medication after surgery.  Increasing fluid intake and taking a stool softener will usually help or prevent this problem from occurring.  A mild laxative (Milk of Magnesia or Miralax) should be taken according to package directions if there are no bowel movements after 48 hours. ?I used skin glue on the incision, you may shower in 24 hours.  The glue will flake off over the next 2-3 weeks.  Any sutures or staples will be removed at the office during your follow-up visit. ?ACTIVITIES:  You may resume regular daily activities (gradually increasing) beginning the next day.  ?You may drive when you no longer are taking prescription pain medication, you can comfortably wear a seatbelt, and you can safely maneuver your car and apply brakes. ?RETURN TO WORK:  ______________________________________________________________________________________ ?You should see your doctor in the office for a follow-up appointment approximately two weeks after your surgery.  Your doctor?s nurse will typically make your follow-up appointment when she calls you with your pathology report.  Expect your pathology report 3-4 business days after your surgery.  You may call to check if you do not hear from [place] after three  days. ?OTHER INSTRUCTIONS: _______________________________________________________________________________________________ _____________________________________________________________________________________________________________________________________ ?_____________________________________________________________________________________________________________________________________ ?_____________________________________________________________________________________________________________________________________ ? ?WHEN TO CALL DR [place]KEFIELD: ?Fever over 101.0 ?Nausea and/or vomiting. ?Extreme swelling or bruising. ?Continued bleeding from incision. ?Increased pain, redness, or drainage from the incision. ? ?The clinic staff is available to answer your questions during regular business hours.  Please don?t hesitate to call and ask to speak to one of the nurses for clinical concerns.  If you have a medical emergency, go to the nearest emergency room or call 911.  A surgeon from [facility] is always on call at the hospital. ? ?For further questions, please visit [email or web address] ? ?Post Anesthesia Home Care Instructions ? ?Activity: ?Get plenty of rest for the remainder of the day. A responsible individual must stay with you for 24 hours following the procedure.  ?For the next 24 hours, DO NOT: ?-Drive a car ?-Paediatric nurse ?-Drink alcoholic beverages ?-Take any medication unless instructed by your physician ?-Make any legal decisions or sign important papers. ? ?Meals: ?Start with liquid foods such as gelatin or soup. Progress to regular foods as tolerated. Avoid greasy, spicy, heavy foods. If nausea and/or vomiting occur, drink only clear liquids until the nausea and/or vomiting subsides. Call your physician if vomiting continues. ? ?Special Instructions/Symptoms: ?Your throat may feel dry or sore from the anesthesia or the breathing tube placed in your throat during surgery.  If this causes discomfort, gargle with warm salt water. The discomfort should disappear within 24 hours. ? ?If you had a scopolamine patch placed behind your ear for the management of post- operative nausea and/or vomiting: ? ?1. The medication in the patch is effective for  72 hours, after which it should be removed.  Wrap patch in a tissue and discard in the trash. Wash hands thoroughly with soap and water. ?2. You may remove the patch earlier than 72 hours if you experience unpleasant side effects which may include dry mouth, dizziness or visual disturbances. ?3. Avoid touching the patch. Wash your hands with soap and water after contact with the patch. ?    ? ? ? ? ?

## 2021-03-26 NOTE — Interval H&P Note (Signed)
History and Physical Interval Note: ? ?03/26/2021 ?11:01 AM ? ?Catherine Moon  has presented today for surgery, with the diagnosis of RIGHT AXILLARY SEBACEOUS CYST.  The various methods of treatment have been discussed with the patient and family. After consideration of risks, benefits and other options for treatment, the patient has consented to  Procedure(s) with comments: ?RIGHT AXILLARY SEBACEOUS CYST EXCISION (Right) - LOCAL as a surgical intervention.  The patient's history has been reviewed, patient examined, no change in status, stable for surgery.  I have reviewed the patient's chart and labs.  Questions were answered to the patient's satisfaction.   ? ? ?Rolm Bookbinder ? ? ?

## 2021-03-26 NOTE — Transfer of Care (Signed)
Immediate Anesthesia Transfer of Care Note ? ?Patient: Catherine Moon ? ?Procedure(s) Performed: RIGHT AXILLARY SEBACEOUS CYST EXCISION (Right: Axilla) ? ?Patient Location: PACU ? ?Anesthesia Type:MAC ? ?Level of Consciousness: awake, alert , oriented and patient cooperative ? ?Airway & Oxygen Therapy: Patient Spontanous Breathing and Patient connected to face mask oxygen ? ?Post-op Assessment: Report given to RN and Post -op Vital signs reviewed and stable ? ?Post vital signs: Reviewed and stable ? ?Last Vitals:  ?Vitals Value Taken Time  ?BP 137/62 03/26/21 1208  ?Temp    ?Pulse 95 03/26/21 1210  ?Resp 17 03/26/21 1210  ?SpO2 98 % 03/26/21 1210  ?Vitals shown include unvalidated device data. ? ?Last Pain:  ?Vitals:  ? 03/26/21 0959  ?TempSrc: Oral  ?PainSc: 0-No pain  ?   ? ?Patients Stated Pain Goal: 3 (03/26/21 0959) ? ?Complications: No notable events documented. ?

## 2021-03-26 NOTE — Op Note (Signed)
Preoperative diagnosis: right axillary sebaceous cyst ?Postoperative diagnosis: Same as above ?Procedure: Right axillary sebaceous cyst excision, 2x2 cm ?Surgeon: Dr. Serita Grammes ?Anesthesia: General ?Specimens:Right axillary cyst ?Complications: None ?Drains: None ?Estimated blood loss: Minimal ?Special count was correct at completion ?Disposition to recovery stable condition ? ?Indications:61 y.o. female with a history of diabetes which she is working on getting better controlled right now. She has a history of a right axillary cyst that has been stable for many years. However recent past after she had COVID in September she has had a lot of drainage out of this. This has finally stopped but she has been on antibiotics and she has had a fair amount of drainage out of the cyst. She is here to discuss her options today.we elected to proceed with excision.  ? ?Procedure: After informed consent was obtained the patient was taken to the OR and placed under MAC. She was given antibiotics.  She was prepped and draped in the standard sterile surgical fashion.  A surgical timeout was then performed. ? ?I made an elliptical incision around the chronically inflamed cyst. I then removed the overlying skin and the mass .  There was no visible cyst remaining.    I then obtained hemostasis.  This was closed with 2-0 Vicryl, 3-0 Vicryl, and 4-0 Monocryl.  Glue and Steri-Strips were applied.  She tolerated this well was extubated transferred to recovery stable. ?

## 2021-03-27 ENCOUNTER — Encounter (HOSPITAL_BASED_OUTPATIENT_CLINIC_OR_DEPARTMENT_OTHER): Payer: Self-pay | Admitting: General Surgery

## 2021-03-27 LAB — SURGICAL PATHOLOGY

## 2021-03-30 NOTE — Addendum Note (Signed)
Addendum  created 03/30/21 1611 by Janeece Riggers, MD  ? [place] recorded in [place], [place] accepted, Intraprocedure Attestations filed  ?  ?

## 2021-04-02 ENCOUNTER — Ambulatory Visit: Payer: BC Managed Care – PPO | Admitting: Endocrinology

## 2021-04-02 VITALS — BP 148/70 | HR 93 | Ht 66.0 in | Wt 216.2 lb

## 2021-04-02 DIAGNOSIS — Z794 Long term (current) use of insulin: Secondary | ICD-10-CM | POA: Diagnosis not present

## 2021-04-02 DIAGNOSIS — E119 Type 2 diabetes mellitus without complications: Secondary | ICD-10-CM | POA: Diagnosis not present

## 2021-04-02 MED ORDER — TOUJEO MAX SOLOSTAR 300 UNIT/ML ~~LOC~~ SOPN: 60.0000 [IU] | PEN_INJECTOR | Freq: Every day | SUBCUTANEOUS | 3 refills | Status: DC

## 2021-04-02 NOTE — Progress Notes (Signed)
? ?Subjective:  ? ? Patient ID: Catherine Moon, female    DOB: 06/02/1960, 61 y.o.   MRN: [id number] ? ?HPI ?Pt returns for f/u of diabetes mellitus: ?DM type: Insulin-requiring type 2 ?Dx'ed: 1997 ?Complications: none.  ?Therapy: insulin since 2010, and Ozempic.   ?GDM: never  ?DKA: never ?Severe hypoglycemia: never.   ?Pancreatitis: never.   ?SDOH: ins declines to refill continuous glucose monitor.   ?Other: DM is characterized by severe insulin resistance; she takes multiple daily injections; she works 2nd shift.   ?Interval history: pt states she feels well in general.  no cbg record, but states cbg's varies from 180-285.  It is in general highest fasting.   ?Past Medical History:  ?Diagnosis Date  ? CTS (carpal tunnel syndrome)   ? DM (diabetes mellitus) ([place])   ? Dyslipidemia   ? Heart murmur   ? had ECHO 01-2021  ? HTN (hypertension)   ? Hypercholesterolemia   ? Seasonal allergies   ? Smoker   ? Warts, genital   ? ? ?Past Surgical History:  ?Procedure Laterality Date  ? COLONOSCOPY    ? CYST EXCISION Right 03/26/2021  ? Procedure: RIGHT AXILLARY SEBACEOUS CYST EXCISION;  Surgeon: Rolm Bookbinder, MD;  Location: [facility];  Service: General;  Laterality: Right;  LOCAL  ? DILATION AND CURETTAGE OF UTERUS    ? WISDOM TOOTH EXTRACTION    ? ? ?Social History  ? ?Socioeconomic History  ? Marital status: Married  ?  Spouse name: Not on file  ? Number of children: Not on file  ? Years of education: Not on file  ? Highest education level: Associate degree: academic program  ?Occupational History  ? Not on file  ?Tobacco Use  ? Smoking status: Former  ?  Packs/day: 1.00  ?  Years: 26.00  ?  Pack years: 26.00  ?  Types: Cigarettes  ?  Quit date: 08/13/2007  ?  Years since quitting: 13.6  ? Smokeless tobacco: Never  ? Tobacco comments:  ?   She smoked one pack per day for 26 years but quit in 2009.  ?Substance and Sexual Activity  ? Alcohol use: Yes  ?  Comment: social  ? Drug use: No  ? Sexual  activity: Not on file  ?Other Topics Concern  ? Not on file  ?Social History Narrative  ? Works 3rd shift in [employer], married.  She smoked one pack per day for 26 years but quit in 2009.  ?   ?   ? ?Social Determinants of Health  ? ?Financial Resource Strain: Low Risk   ? Difficulty of Paying Living Expenses: Not hard at all  ?Food Insecurity: No Food Insecurity  ? Worried About Charity fundraiser in the Last Year: Never true  ? Ran Out of Food in the Last Year: Never true  ?Transportation Needs: No Transportation Needs  ? Lack of Transportation (Medical): No  ? Lack of Transportation (Non-Medical): No  ?Physical Activity: Insufficiently Active  ? Days of Exercise per Week: 4 days  ? Minutes of Exercise per Session: 20 min  ?Stress: Stress Concern Present  ? Feeling of Stress : To some extent  ?Social Connections: Unknown  ? Frequency of Communication with Friends and Family: Once a week  ? Frequency of Social Gatherings with Friends and Family: Patient refused  ? Attends Religious Services: Never  ? Active Member of Clubs or Organizations: No  ? Attends Archivist Meetings: Not  on file  ? Marital Status: Married  ?Intimate Partner Violence: Not on file  ? ? ?Current Outpatient Medications on File Prior to Visit  ?Medication Sig Dispense Refill  ? acetaminophen (TYLENOL) 500 MG tablet Take 1,000 mg by mouth every 6 (six) hours as needed for mild pain, moderate pain, fever or headache.    ? Ascorbic Acid (VITAMIN C) 1000 MG tablet Take 1,000 mg by mouth daily.    ? aspirin 81 MG EC tablet Take 81 mg by mouth daily.    ? atorvastatin (LIPITOR) 80 MG tablet Take 1 tablet (80 mg total) by mouth daily. 90 tablet 3  ? Fexofenadine HCl (ALLEGRA PO) Take by mouth.    ? fluticasone (FLONASE) 50 MCG/ACT nasal spray Place 1 spray into both nostrils daily as needed for allergies or rhinitis. Reported on 07/11/2015    ? glucose blood (ONE TOUCH ULTRA TEST) test strip 1 each by Other route 3 (three) times  daily. And lancets 3/day 300 each 3  ? insulin lispro (HUMALOG KWIKPEN) 200 UNIT/ML KwikPen Inject 30 Units into the skin 3 (three) times daily with meals. And pen needles 4/day 90 mL 3  ? Insulin Pen Needle 29G X 12MM MISC 1 each by Does not apply route 4 (four) times daily. 360 each 3  ? lisinopril-hydrochlorothiazide (ZESTORETIC) 20-12.5 MG tablet Take 1 tablet by mouth daily. 90 tablet 2  ? Loratadine (CLARITIN PO) Take by mouth.    ? magnesium oxide (MAG-OX) 400 MG tablet Take 400 mg by mouth daily.    ? Multiple Vitamin (MULTIVITAMIN WITH MINERALS) TABS tablet Take 1 tablet by mouth daily.    ? Potassium 99 MG TABS Take 1 tablet by mouth daily.    ? Semaglutide, 2 MG/DOSE, (OZEMPIC, 2 MG/DOSE,) 8 MG/3ML SOPN Inject 2 mg into the skin once a week. 9 mL 3  ? zinc gluconate 50 MG tablet Take 50 mg by mouth every other day.    ? ?No current facility-administered medications on file prior to visit.  ? ? ?No Known Allergies ? ?Family History  ?Problem Relation Age of Onset  ? Breast cancer Mother   ? Heart disease Mother   ? Cancer Mother   ?     Breast  ? Coronary artery disease Father   ?     starting in his 48s. A bypass and mitral infarction  ? Heart disease Father   ?     Coronary Disease, bypass and mitral infarction  ? Colon cancer Paternal Grandmother   ? ? ?BP (!) 148/70 (BP Location: Left Arm, Patient Position: Sitting, Cuff Size: Normal)   Pulse 93   Ht '5\' 6"'$  (1.676 m)   Wt 216 lb 3.2 oz (98.1 kg)   SpO2 97%   BMI 34.90 kg/m?  ? ? ?Review of Systems ? ?   ?Objective:  ? Physical Exam ?VITAL SIGNS:  See vs page ?GENERAL: no distress ? ? ? ?   ?Assessment & Plan:  ?Insulin-requiring type 2 DM: uncontrolled.  ? ?Patient Instructions  ?I have sent prescriptions to your pharmacy, to increase the Toujeo to 60 units at bedtime, and:  ?Please continue the same other medications.   ?check your blood sugar twice a day.  vary the time of day when you check, between before the 3 meals, and at bedtime.  also  check if you have symptoms of your blood sugar being too high or too low.  please keep a record of the readings and bring  it to your next appointment here (or you can bring the meter itself).  You can write it on any piece of paper.  please call us sooner if your blood sugar goes below 70, or if you have a lot of readings over 200.   ?Please come back for a follow-up appointment in 6 weeks.   ? ? ?

## 2021-04-02 NOTE — Patient Instructions (Addendum)
I have sent prescriptions to your pharmacy, to increase the Toujeo to 60 units at bedtime, and:  ?Please continue the same other medications.   ?check your blood sugar twice a day.  vary the time of day when you check, between before the 3 meals, and at bedtime.  also check if you have symptoms of your blood sugar being too high or too low.  please keep a record of the readings and bring it to your next appointment here (or you can bring the meter itself).  You can write it on any piece of paper.  please call us sooner if your blood sugar goes below 70, or if you have a lot of readings over 200.   ?Please come back for a follow-up appointment in 6 weeks.   ?

## 2021-05-14 ENCOUNTER — Ambulatory Visit: Payer: BC Managed Care – PPO | Admitting: Endocrinology

## 2021-05-18 ENCOUNTER — Ambulatory Visit: Payer: BC Managed Care – PPO | Admitting: Internal Medicine

## 2021-05-18 ENCOUNTER — Encounter: Payer: Self-pay | Admitting: Internal Medicine

## 2021-05-18 VITALS — BP 140/90 | HR 96 | Ht 66.0 in | Wt 225.4 lb

## 2021-05-18 DIAGNOSIS — Z794 Long term (current) use of insulin: Secondary | ICD-10-CM | POA: Diagnosis not present

## 2021-05-18 DIAGNOSIS — E1169 Type 2 diabetes mellitus with other specified complication: Secondary | ICD-10-CM

## 2021-05-18 DIAGNOSIS — E1165 Type 2 diabetes mellitus with hyperglycemia: Secondary | ICD-10-CM | POA: Diagnosis not present

## 2021-05-18 DIAGNOSIS — E785 Hyperlipidemia, unspecified: Secondary | ICD-10-CM

## 2021-05-18 LAB — POCT GLYCOSYLATED HEMOGLOBIN (HGB A1C): Hemoglobin A1C: 8.6 % — AB (ref 4.0–5.6)

## 2021-05-18 MED ORDER — TOUJEO MAX SOLOSTAR 300 UNIT/ML ~~LOC~~ SOPN: 80.0000 [IU] | PEN_INJECTOR | Freq: Every day | SUBCUTANEOUS | 3 refills | Status: DC

## 2021-05-18 MED ORDER — INSULIN PEN NEEDLE 32G X 4 MM MISC: 3 refills | Status: AC

## 2021-05-18 NOTE — Patient Instructions (Addendum)
Please increase: ?- Toujeo 80 units at bedtime ? ?Continue: ?- Humalog 30 units 3x a day before meals ?- Ozempic 2 mg weekly ? ?If you eat a snack with >15g of carbs, you need a Humalog bolus - ~8-10 units. ? ?Try to stop snacking at night. ? ?Please return in 3-4 months with your sugar log.  ? ?

## 2021-05-18 NOTE — Progress Notes (Signed)
Patient ID: Catherine Moon, female   DOB: 08/09/1960, 61 y.o.   MRN: [id number] ? ?HPI: ?Catherine Moon is a 61 y.o.-year-old female, returning for follow-up for DM2, dx in 1997, insulin-dependent since 2010, uncontrolled, without long-term complications.  She previously saw Dr. Loanne Drilling, last visit 1.5 months ago. ? ?Reviewed HbA1c: ?Lab Results  ?Component Value Date  ? HGBA1C 9.8 (A) 02/11/2021  ? HGBA1C 14.6 (A) 12/11/2020  ? HGBA1C 14.4 (H) 12/10/2020  ? HGBA1C 6.1 (A) 03/05/2020  ? HGBA1C 5.8 (A) 10/31/2019  ? HGBA1C 5.7 (A) 08/29/2019  ? HGBA1C 6.1 (A) 06/26/2019  ? HGBA1C 8.3 (A) 04/10/2019  ? HGBA1C 9.3 (A) 02/07/2019  ? HGBA1C 8.4 (A) 02/21/2018  ? ?Pt is on a regimen of: ?- Toujeo 60 units at bedtime (2-3  am) ?- Humalog 30-40 units 3x a day before meals ?- Ozempic 2 mg weekly ? ?Pt checks her sugars 0-1x a day and they are: ?- am: 200-273 ?- 2h after b'fast: n/c ?- before lunch: n/c ?- 2h after lunch: n/c ?- before dinner: n/c ?- 2h after dinner: up to 300 ?- bedtime: n/c  ?- nighttime: n/c ?Lowest sugar was 209; she has hypoglycemia awareness at 70.  ?Highest sugar was 303. ? ?Glucometer: One Touch ultra ? ?- no CKD, last BUN/creatinine:  ?Lab Results  ?Component Value Date  ? BUN 19 03/24/2021  ? BUN 11 12/10/2020  ? CREATININE 0.76 03/24/2021  ? CREATININE 0.51 12/10/2020  ?On lisinopril 20 mg daily. ? ?-+ Hyperlipidemia including hypertriglyceridemia; last set of lipids: ?Lab Results  ?Component Value Date  ? CHOL 196 02/23/2021  ? HDL 56.80 02/23/2021  ? LDLCALC 102 (H) 02/23/2021  ? LDLDIRECT 137.0 12/10/2020  ? TRIG 188.0 (H) 02/23/2021  ? CHOLHDL 3 02/23/2021  ?On Lipitor 80 mg daily. ? ?- last eye exam was in 01/2020. No DR. Dr. Idolina Primer. ? ?- no numbness and tingling in her feet. ? ?On ASA 81. ? ?Patient works second shift. ? ?ROS: ?+ see HPI ?No increased urination, blurry vision, nausea, chest pain. ? ?Past Medical History:  ?Diagnosis Date  ? CTS (carpal tunnel syndrome)   ? DM  (diabetes mellitus) ([place])   ? Dyslipidemia   ? Heart murmur   ? had ECHO 01-2021  ? HTN (hypertension)   ? Hypercholesterolemia   ? Seasonal allergies   ? Smoker   ? Warts, genital   ? ?Past Surgical History:  ?Procedure Laterality Date  ? COLONOSCOPY    ? CYST EXCISION Right 03/26/2021  ? Procedure: RIGHT AXILLARY SEBACEOUS CYST EXCISION;  Surgeon: Rolm Bookbinder, MD;  Location: [facility];  Service: General;  Laterality: Right;  LOCAL  ? DILATION AND CURETTAGE OF UTERUS    ? WISDOM TOOTH EXTRACTION    ? ?Social History  ? ?Socioeconomic History  ? Marital status: Married  ?  Spouse name: Not on file  ? Number of children: Not on file  ? Years of education: Not on file  ? Highest education level: Associate degree: academic program  ?Occupational History  ? Not on file  ?Tobacco Use  ? Smoking status: Former  ?  Packs/day: 1.00  ?  Years: 26.00  ?  Pack years: 26.00  ?  Types: Cigarettes  ?  Quit date: 08/13/2007  ?  Years since quitting: 13.7  ? Smokeless tobacco: Never  ? Tobacco comments:  ?   She smoked one pack per day for 26 years but quit in 2009.  ?Substance and  Sexual Activity  ? Alcohol use: Yes  ?  Comment: social  ? Drug use: No  ? Sexual activity: Not on file  ?Other Topics Concern  ? Not on file  ?Social History Narrative  ? Works 3rd shift in [employer], married.  She smoked one pack per day for 26 years but quit in 2009.  ?   ?   ? ?Social Determinants of Health  ? ?Financial Resource Strain: Low Risk   ? Difficulty of Paying Living Expenses: Not hard at all  ?Food Insecurity: No Food Insecurity  ? Worried About Charity fundraiser in the Last Year: Never true  ? Ran Out of Food in the Last Year: Never true  ?Transportation Needs: No Transportation Needs  ? Lack of Transportation (Medical): No  ? Lack of Transportation (Non-Medical): No  ?Physical Activity: Insufficiently Active  ? Days of Exercise per Week: 4 days  ? Minutes of Exercise per Session: 20 min  ?Stress: Stress  Concern Present  ? Feeling of Stress : To some extent  ?Social Connections: Unknown  ? Frequency of Communication with Friends and Family: Once a week  ? Frequency of Social Gatherings with Friends and Family: Patient refused  ? Attends Religious Services: Never  ? Active Member of Clubs or Organizations: No  ? Attends Archivist Meetings: Not on file  ? Marital Status: Married  ?Intimate Partner Violence: Not on file  ? ?Current Outpatient Medications on File Prior to Visit  ?Medication Sig Dispense Refill  ? acetaminophen (TYLENOL) 500 MG tablet Take 1,000 mg by mouth every 6 (six) hours as needed for mild pain, moderate pain, fever or headache.    ? Ascorbic Acid (VITAMIN C) 1000 MG tablet Take 1,000 mg by mouth daily.    ? aspirin 81 MG EC tablet Take 81 mg by mouth daily.    ? atorvastatin (LIPITOR) 80 MG tablet Take 1 tablet (80 mg total) by mouth daily. 90 tablet 3  ? Fexofenadine HCl (ALLEGRA PO) Take by mouth.    ? fluticasone (FLONASE) 50 MCG/ACT nasal spray Place 1 spray into both nostrils daily as needed for allergies or rhinitis. Reported on 07/11/2015    ? glucose blood (ONE TOUCH ULTRA TEST) test strip 1 each by Other route 3 (three) times daily. And lancets 3/day 300 each 3  ? insulin glargine, 2 Unit Dial, (TOUJEO MAX SOLOSTAR) 300 UNIT/ML Solostar Pen Inject 60 Units into the skin at bedtime. 24 mL 3  ? insulin lispro (HUMALOG KWIKPEN) 200 UNIT/ML KwikPen Inject 30 Units into the skin 3 (three) times daily with meals. And pen needles 4/day 90 mL 3  ? Insulin Pen Needle 29G X 12MM MISC 1 each by Does not apply route 4 (four) times daily. 360 each 3  ? lisinopril-hydrochlorothiazide (ZESTORETIC) 20-12.5 MG tablet Take 1 tablet by mouth daily. 90 tablet 2  ? Loratadine (CLARITIN PO) Take by mouth.    ? magnesium oxide (MAG-OX) 400 MG tablet Take 400 mg by mouth daily.    ? Multiple Vitamin (MULTIVITAMIN WITH MINERALS) TABS tablet Take 1 tablet by mouth daily.    ? Potassium 99 MG TABS Take 1  tablet by mouth daily.    ? Semaglutide, 2 MG/DOSE, (OZEMPIC, 2 MG/DOSE,) 8 MG/3ML SOPN Inject 2 mg into the skin once a week. 9 mL 3  ? zinc gluconate 50 MG tablet Take 50 mg by mouth every other day.    ? ?No current facility-administered medications on  file prior to visit.  ? ?No Known Allergies ?Family History  ?Problem Relation Age of Onset  ? Breast cancer Mother   ? Heart disease Mother   ? Cancer Mother   ?     Breast  ? Coronary artery disease Father   ?     starting in his 67s. A bypass and mitral infarction  ? Heart disease Father   ?     Coronary Disease, bypass and mitral infarction  ? Colon cancer Paternal Grandmother   ? ?PE: ?BP 140/90 (BP Location: Left Arm, Patient Position: Sitting, Cuff Size: Normal)   Pulse 96   Ht '5\' 6"'$  (1.676 m)   Wt 225 lb 6.4 oz (102.2 kg)   SpO2 98%   BMI 36.38 kg/m?  ?Wt Readings from Last 3 Encounters:  ?05/18/21 225 lb 6.4 oz (102.2 kg)  ?04/02/21 216 lb 3.2 oz (98.1 kg)  ?03/26/21 214 lb 4.6 oz (97.2 kg)  ? ?Constitutional: overweight, in NAD ?Eyes: PERRLA, EOMI, no exophthalmos ?ENT: moist mucous membranes, no thyromegaly, no cervical lymphadenopathy ?Cardiovascular: RRR, No MRG ?Respiratory: CTA B ?Musculoskeletal: no deformities, strength intact in all 4 ?Skin: moist, warm, no rashes ?Neurological: no tremor with outstretched hands, DTR normal in all 4 ? ?ASSESSMENT: ?1. DM2, non-insulin-dependent, uncontrolled, without long-term complications, but with hyperglycemia ? ?2. HL ? ?PLAN:  ?1. Patient with long-standing, uncontrolled diabetes, on injectable antidiabetic regimen, with improvement of her blood sugars and HbA1c in the last several months.  HbA1c at last visit was still high, though, at 9.8%, but improved from 14.4%. ?Today - 8.6% (lower). ?-At today's visit, we reviewed her blood sugars at home and they are still very high, in the 200s to 300s.  Upon questioning, she had a nighttime chronic rhythm, working second shift and going to bed around 2 to 3  AM.  She takes Toujeo at bedtime.  She snacks throughout the evening including chips and other highly processed foods.  We discussed that this is most likely the reason why her sugars stay still high.  We disc

## 2021-06-15 LAB — HM DIABETES EYE EXAM

## 2021-06-18 ENCOUNTER — Encounter: Payer: Self-pay | Admitting: Internal Medicine

## 2021-06-18 DIAGNOSIS — Z794 Long term (current) use of insulin: Secondary | ICD-10-CM

## 2021-06-19 MED ORDER — HUMALOG KWIKPEN 200 UNIT/ML ~~LOC~~ SOPN: 30.0000 [IU] | PEN_INJECTOR | Freq: Three times a day (TID) | SUBCUTANEOUS | 3 refills | Status: DC

## 2021-07-13 ENCOUNTER — Encounter: Payer: Self-pay | Admitting: Internal Medicine

## 2021-08-25 ENCOUNTER — Other Ambulatory Visit: Payer: Self-pay | Admitting: Family Medicine

## 2021-08-25 DIAGNOSIS — I10 Essential (primary) hypertension: Secondary | ICD-10-CM

## 2021-09-01 NOTE — Progress Notes (Signed)
HPI: Catherine Moon is a 61 y.o. female, who is here today for follow up. She was last seen on 02/23/21.  Hypertension:  Medications:Lisinopril-HCTZ 20-12.5 mg daily. BP readings at home:Not checking. Side effects:None. Negative for unusual or severe headache, visual changes, exertional chest pain, dyspnea,  focal weakness, or edema.  Lab Results  Component Value Date   CREATININE 0.76 03/24/2021   BUN 19 03/24/2021   NA 136 03/24/2021   K 4.2 03/24/2021   CL 102 03/24/2021   CO2 26 03/24/2021   She does not exercise regularly and for the past few months she has not been consistent with following a healthful diet. Working from home. She is not cooking. She has gained some wt since her last visit.  DM II: Her endocrinologist retired and she has established with Dr Cruzita Lederer. Lab Results  Component Value Date   HGBA1C 8.6 (A) 05/18/2021   HLD: She is on Atorvastatin 80 mg daily, dose was increased last visit. Tolerating medication well. Component     Latest Ref Rng 02/23/2021  Cholesterol     0 - 200 mg/dL 196   Triglycerides     0.0 - 149.0 mg/dL 188.0 (H)   HDL Cholesterol     >39.00 mg/dL 56.80   VLDL     0.0 - 40.0 mg/dL 37.6   LDL (calc)     0 - 99 mg/dL 102 (H)   Total CHOL/HDL Ratio 3   NonHDL 139.47     Former smoker. Quit 14 years ago and smoke for 26 years 1 PPD.  Review of Systems  Constitutional:  Negative for activity change, appetite change and fever.  HENT:  Negative for mouth sores, nosebleeds and sore throat.   Respiratory:  Negative for cough and wheezing.   Gastrointestinal:  Negative for abdominal pain, nausea and vomiting.       Negative for changes in bowel habits.  Genitourinary:  Negative for decreased urine volume, dysuria and hematuria.  Neurological:  Negative for syncope and facial asymmetry.  Psychiatric/Behavioral:  Negative for confusion. The patient is not nervous/anxious.   Rest see pertinent positives and negatives  per HPI.  Current Outpatient Medications on File Prior to Visit  Medication Sig Dispense Refill   acetaminophen (TYLENOL) 500 MG tablet Take 1,000 mg by mouth every 6 (six) hours as needed for mild pain, moderate pain, fever or headache.     Ascorbic Acid (VITAMIN C) 1000 MG tablet Take 1,000 mg by mouth daily.     aspirin 81 MG EC tablet Take 81 mg by mouth daily.     atorvastatin (LIPITOR) 80 MG tablet Take 1 tablet (80 mg total) by mouth daily. 90 tablet 3   Fexofenadine HCl (ALLEGRA PO) Take by mouth.     fluticasone (FLONASE) 50 MCG/ACT nasal spray Place 1 spray into both nostrils daily as needed for allergies or rhinitis. Reported on 07/11/2015     glucose blood (ONE TOUCH ULTRA TEST) test strip 1 each by Other route 3 (three) times daily. And lancets 3/day 300 each 3   insulin glargine, 2 Unit Dial, (TOUJEO MAX SOLOSTAR) 300 UNIT/ML Solostar Pen Inject 80 Units into the skin at bedtime. 24 mL 3   insulin lispro (HUMALOG KWIKPEN) 200 UNIT/ML KwikPen Inject 30 Units into the skin 3 (three) times daily with meals. And pen needles 4/day 90 mL 3   Insulin Pen Needle 32G X 4 MM MISC Use 4x a day 400 each 3   lisinopril-hydrochlorothiazide (ZESTORETIC) 20-12.5  MG tablet TAKE 1 TABLET BY MOUTH EVERY DAY 90 tablet 2   Loratadine (CLARITIN PO) Take by mouth.     magnesium oxide (MAG-OX) 400 MG tablet Take 400 mg by mouth daily.     Multiple Vitamin (MULTIVITAMIN WITH MINERALS) TABS tablet Take 1 tablet by mouth daily.     Potassium 99 MG TABS Take 1 tablet by mouth daily.     Semaglutide, 2 MG/DOSE, (OZEMPIC, 2 MG/DOSE,) 8 MG/3ML SOPN Inject 2 mg into the skin once a week. 9 mL 3   zinc gluconate 50 MG tablet Take 50 mg by mouth every other day.     No current facility-administered medications on file prior to visit.   Past Medical History:  Diagnosis Date   CTS (carpal tunnel syndrome)    DM (diabetes mellitus) (HCC)    Dyslipidemia    Heart murmur    had ECHO 01-2021   HTN (hypertension)     Hypercholesterolemia    Seasonal allergies    Smoker    Warts, genital    No Known Allergies  Social History   Socioeconomic History   Marital status: Married    Spouse name: Not on file   Number of children: Not on file   Years of education: Not on file   Highest education level: Associate degree: academic program  Occupational History   Not on file  Tobacco Use   Smoking status: Former    Packs/day: 1.00    Years: 26.00    Total pack years: 26.00    Types: Cigarettes    Quit date: 08/13/2007    Years since quitting: 14.0   Smokeless tobacco: Never   Tobacco comments:     She smoked one pack per day for 26 years but quit in 2009.  Substance and Sexual Activity   Alcohol use: Yes    Comment: social   Drug use: No   Sexual activity: Not on file  Other Topics Concern   Not on file  Social History Narrative   Works 3rd shift in [employer], married.  She smoked one pack per day for 26 years but quit in 2009.         Social Determinants of Health   Financial Resource Strain: Low Risk  (02/19/2021)   Overall Financial Resource Strain (CARDIA)    Difficulty of Paying Living Expenses: Not hard at all  Food Insecurity: No Food Insecurity (02/19/2021)   Hunger Vital Sign    Worried About Running Out of Food in the Last Year: Never true    Ran Out of Food in the Last Year: Never true  Transportation Needs: No Transportation Needs (02/19/2021)   PRAPARE - Hydrologist (Medical): No    Lack of Transportation (Non-Medical): No  Physical Activity: Insufficiently Active (02/19/2021)   Exercise Vital Sign    Days of Exercise per Week: 4 days    Minutes of Exercise per Session: 20 min  Stress: Stress Concern Present (02/19/2021)   [place]    Feeling of Stress : To some extent  Social Connections: Unknown (02/19/2021)   Social Connection and Isolation Panel [NHANES]     Frequency of Communication with Friends and Family: Once a week    Frequency of Social Gatherings with Friends and Family: Patient refused    Attends Religious Services: Never    Marine scientist or Organizations: No    Attends Club or  Organization Meetings: Not on file    Marital Status: Married   Vitals:   09/02/21 1426  BP: 128/72  Pulse: 100  Resp: 16  SpO2: 93%   Wt Readings from Last 3 Encounters:  09/02/21 240 lb 2 oz (108.9 kg)  05/18/21 225 lb 6.4 oz (102.2 kg)  04/02/21 216 lb 3.2 oz (98.1 kg)  Body mass index is 38.76 kg/m.  Physical Exam Vitals and nursing note reviewed.  Constitutional:      General: She is not in acute distress.    Appearance: She is well-developed.  HENT:     Head: Normocephalic and atraumatic.     Mouth/Throat:     Mouth: Mucous membranes are moist.     Pharynx: Oropharynx is clear.  Eyes:     Conjunctiva/sclera: Conjunctivae normal.  Cardiovascular:     Rate and Rhythm: Normal rate and regular rhythm.     Pulses:          Dorsalis pedis pulses are 2+ on the right side and 2+ on the left side.     Heart sounds: Murmur (Soft SEM LUSB) heard.  Pulmonary:     Effort: Pulmonary effort is normal. No respiratory distress.     Breath sounds: Normal breath sounds.  Abdominal:     Palpations: Abdomen is soft. There is no hepatomegaly or mass.     Tenderness: There is no abdominal tenderness.  Musculoskeletal:     Right lower leg: 1+ Pitting Edema present.     Left lower leg: 1+ Pitting Edema present.  Lymphadenopathy:     Cervical: No cervical adenopathy.  Skin:    General: Skin is warm.     Findings: No erythema or rash.  Neurological:     General: No focal deficit present.     Mental Status: She is alert and oriented to person, place, and time.     Cranial Nerves: No cranial nerve deficit.     Gait: Gait normal.  Psychiatric:     Comments: Well groomed, good eye contact.   ASSESSMENT AND PLAN:  Catherine Moon was seen today  for follow-up.  Diagnoses and all orders for this visit: Orders Placed This Encounter  Procedures   Comprehensive metabolic panel   Lipid panel   Ambulatory Referral for Lung Cancer Scre   Lab Results  Component Value Date   CREATININE 0.69 09/02/2021   BUN 18 09/02/2021   NA 138 09/02/2021   K 3.8 09/02/2021   CL 99 09/02/2021   CO2 29 09/02/2021   Lab Results  Component Value Date   ALT 23 09/02/2021   AST 19 09/02/2021   ALKPHOS 83 09/02/2021   BILITOT 0.8 09/02/2021   Lab Results  Component Value Date   CHOL 166 09/02/2021   HDL 42.90 09/02/2021   LDLCALC 99 09/02/2021   LDLDIRECT 137.0 12/10/2020   TRIG 124.0 09/02/2021   CHOLHDL 4 09/02/2021   Essential hypertension BP adequately controlled. Continue Lisinopril-HCTZ 20-12.5 mg daily and low salt diet. Monitor BP regularly.  Encounter for screening for lung cancer -     Ambulatory Referral for Lung Cancer Scre  Hyperlipidemia associated with type 2 diabetes mellitus (HCC) Continue Atorvastatin 80 mg daily. Low fat diet. Further recommendations according to FLP result.  Return in about 6 months (around 03/04/2022) for cpe.  Umer Harig G. [place], MD  [facility]. [place] office.

## 2021-09-02 ENCOUNTER — Ambulatory Visit: Payer: BC Managed Care – PPO | Admitting: Family Medicine

## 2021-09-02 ENCOUNTER — Encounter: Payer: Self-pay | Admitting: Family Medicine

## 2021-09-02 VITALS — BP 128/72 | HR 100 | Resp 16 | Ht 66.0 in | Wt 240.1 lb

## 2021-09-02 DIAGNOSIS — E1169 Type 2 diabetes mellitus with other specified complication: Secondary | ICD-10-CM | POA: Diagnosis not present

## 2021-09-02 DIAGNOSIS — Z122 Encounter for screening for malignant neoplasm of respiratory organs: Secondary | ICD-10-CM

## 2021-09-02 DIAGNOSIS — I10 Essential (primary) hypertension: Secondary | ICD-10-CM | POA: Diagnosis not present

## 2021-09-02 DIAGNOSIS — E785 Hyperlipidemia, unspecified: Secondary | ICD-10-CM

## 2021-09-02 NOTE — Patient Instructions (Signed)
A few things to remember from today's visit:  Encounter for screening for lung cancer - Plan: Ambulatory Referral for Lung Cancer Scre  Essential hypertension - Plan: Comprehensive metabolic panel  Hyperlipidemia associated with type 2 diabetes mellitus ([place]) - Plan: Lipid panel  If you need refills please call your pharmacy. Do not use My Chart to request refills or for acute issues that need immediate attention.   No changes today.  Please be sure medication list is accurate. If a new problem present, please set up appointment sooner than planned today.

## 2021-09-03 LAB — COMPREHENSIVE METABOLIC PANEL
ALT: 23 U/L (ref 0–35)
AST: 19 U/L (ref 0–37)
Albumin: 4.3 g/dL (ref 3.5–5.2)
Alkaline Phosphatase: 83 U/L (ref 39–117)
BUN: 18 mg/dL (ref 6–23)
CO2: 29 mEq/L (ref 19–32)
Calcium: 9.6 mg/dL (ref 8.4–10.5)
Chloride: 99 mEq/L (ref 96–112)
Creatinine, Ser: 0.69 mg/dL (ref 0.40–1.20)
GFR: 94.2 mL/min (ref >60.00)
Glucose, Bld: 257 mg/dL — ABNORMAL HIGH (ref 70–99)
Potassium: 3.8 mEq/L (ref 3.5–5.1)
Sodium: 138 mEq/L (ref 135–145)
Total Bilirubin: 0.8 mg/dL (ref 0.2–1.2)
Total Protein: 7.4 g/dL (ref 6.0–8.3)

## 2021-09-03 LAB — LIPID PANEL
Cholesterol: 166 mg/dL (ref 0–200)
HDL: 42.9 mg/dL (ref >39.00)
LDL Cholesterol: 99 mg/dL (ref 0–99)
NonHDL: 123.5
Total CHOL/HDL Ratio: 4
Triglycerides: 124 mg/dL (ref 0.0–149.0)
VLDL: 24.8 mg/dL (ref 0.0–40.0)

## 2021-09-14 ENCOUNTER — Ambulatory Visit: Payer: BC Managed Care – PPO | Admitting: Internal Medicine

## 2021-09-14 ENCOUNTER — Other Ambulatory Visit (HOSPITAL_COMMUNITY): Payer: Self-pay

## 2021-09-14 ENCOUNTER — Telehealth: Payer: Self-pay

## 2021-09-14 ENCOUNTER — Encounter: Payer: Self-pay | Admitting: Internal Medicine

## 2021-09-14 VITALS — BP 150/84 | HR 106 | Ht 66.0 in | Wt 237.4 lb

## 2021-09-14 DIAGNOSIS — Z794 Long term (current) use of insulin: Secondary | ICD-10-CM

## 2021-09-14 DIAGNOSIS — E113291 Type 2 diabetes mellitus with mild nonproliferative diabetic retinopathy without macular edema, right eye: Secondary | ICD-10-CM | POA: Diagnosis not present

## 2021-09-14 DIAGNOSIS — E785 Hyperlipidemia, unspecified: Secondary | ICD-10-CM | POA: Diagnosis not present

## 2021-09-14 DIAGNOSIS — E1169 Type 2 diabetes mellitus with other specified complication: Secondary | ICD-10-CM

## 2021-09-14 LAB — POCT GLYCOSYLATED HEMOGLOBIN (HGB A1C): Hemoglobin A1C: 10 % — AB (ref 4.0–5.6)

## 2021-09-14 MED ORDER — MOUNJARO 7.5 MG/0.5ML ~~LOC~~ SOAJ
7.5000 mg | SUBCUTANEOUS | 2 refills | Status: DC
Start: 2021-09-14 — End: 2021-10-23

## 2021-09-14 NOTE — Progress Notes (Signed)
Patient ID: Catherine Moon, female   DOB: Mar 09, 1960, 61 y.o.   MRN: [id number]  HPI: Catherine Moon is a 61 y.o.-year-old female, returning for follow-up for DM2, dx in 1997, insulin-dependent since 2010, uncontrolled, with complications (diabetic retinopathy).  She previously saw Dr. Loanne Drilling.  Last visit with me 4 months ago.  Interim history: No increased urination, blurry vision, nausea, chest pain.  Reviewed HbA1c: Lab Results  Component Value Date   HGBA1C 8.6 (A) 05/18/2021   HGBA1C 9.8 (A) 02/11/2021   HGBA1C 14.6 (A) 12/11/2020   HGBA1C 14.4 (H) 12/10/2020   HGBA1C 6.1 (A) 03/05/2020   HGBA1C 5.8 (A) 10/31/2019   HGBA1C 5.7 (A) 08/29/2019   HGBA1C 6.1 (A) 06/26/2019   HGBA1C 8.3 (A) 04/10/2019   HGBA1C 9.3 (A) 02/07/2019   Pt is on a regimen of: - Toujeo 60 units at bedtime (2-3  am) >> 80 >> 90 units daily - Humalog 30-40 units 3x a day before meals - Ozempic 2 mg weekly  Pt checks her sugars 2x a day and they are: - am: 200-273 >> 163, 200-280 - 2h after b'fast: n/c - before lunch: n/c - 2h after lunch: n/c - before dinner: n/c - 2h after dinner: up to 300 >> up to 300 - bedtime: n/c  - nighttime: n/c Lowest sugar was 209 >> 163; she has hypoglycemia awareness at 70.  Highest sugar was 303 >> 400 (icecream but no Humalog).  Glucometer: One Touch ultra  - no CKD, last BUN/creatinine:  Lab Results  Component Value Date   BUN 18 09/02/2021   BUN 19 03/24/2021   CREATININE 0.69 09/02/2021   CREATININE 0.76 03/24/2021  On lisinopril 20 mg daily.  -+ Hyperlipidemia including hypertriglyceridemia; last set of lipids: Lab Results  Component Value Date   CHOL 166 09/02/2021   HDL 42.90 09/02/2021   LDLCALC 99 09/02/2021   LDLDIRECT 137.0 12/10/2020   TRIG 124.0 09/02/2021   CHOLHDL 4 09/02/2021  On Lipitor 80 mg daily.  - last eye exam was on 06/15/2021. 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 Dr.  Idolina Primer.   - no numbness and tingling in her feet.  On ASA 81.  Patient works second shift - 4-12 pm. Will switch to daytime soon.  ROS: + see HPI  Past Medical History:  Diagnosis Date   CTS (carpal tunnel syndrome)    DM (diabetes mellitus) ([place])    Dyslipidemia    Heart murmur    had ECHO 01-2021   HTN (hypertension)    Hypercholesterolemia    Seasonal allergies    Smoker    Warts, genital    Past Surgical History:  Procedure Laterality Date   COLONOSCOPY     CYST EXCISION Right 03/26/2021   Procedure: RIGHT AXILLARY SEBACEOUS CYST EXCISION;  Surgeon: Rolm Bookbinder, MD;  Location: [place];  Service: General;  Laterality: Right;  LOCAL   DILATION AND CURETTAGE OF UTERUS     WISDOM TOOTH EXTRACTION     Social History   Socioeconomic History   Marital status: Married    Spouse name: Not on file   Number of children: Not on file   Years of education: Not on file   Highest education level: Associate degree: academic program  Occupational History   Not on file  Tobacco Use   Smoking status: Former    Packs/day: 1.00    Years: 26.00    Total pack years: 26.00  Types: Cigarettes    Quit date: 08/13/2007    Years since quitting: 14.0   Smokeless tobacco: Never   Tobacco comments:     She smoked one pack per day for 26 years but quit in 2009.  Substance and Sexual Activity   Alcohol use: Yes    Comment: social   Drug use: No   Sexual activity: Not on file  Other Topics Concern   Not on file  Social History Narrative   Works 3rd shift in [employer], married.  She smoked one pack per day for 26 years but quit in 2009.         Social Determinants of Health   Financial Resource Strain: Low Risk  (02/19/2021)   Overall Financial Resource Strain (CARDIA)    Difficulty of Paying Living Expenses: Not hard at all  Food Insecurity: No Food Insecurity (02/19/2021)   Hunger Vital Sign    Worried About Running Out of Food in the Last  Year: Never true    Ran Out of Food in the Last Year: Never true  Transportation Needs: No Transportation Needs (02/19/2021)   PRAPARE - Hydrologist (Medical): No    Lack of Transportation (Non-Medical): No  Physical Activity: Insufficiently Active (02/19/2021)   Exercise Vital Sign    Days of Exercise per Week: 4 days    Minutes of Exercise per Session: 20 min  Stress: Stress Concern Present (02/19/2021)   [place]    Feeling of Stress : To some extent  Social Connections: Unknown (02/19/2021)   Social Connection and Isolation Panel [NHANES]    Frequency of Communication with Friends and Family: Once a week    Frequency of Social Gatherings with Friends and Family: Patient refused    Attends Religious Services: Never    Marine scientist or Organizations: No    Attends Music therapist: Not on file    Marital Status: Married  Human resources officer Violence: Not on file   Current Outpatient Medications on File Prior to Visit  Medication Sig Dispense Refill   acetaminophen (TYLENOL) 500 MG tablet Take 1,000 mg by mouth every 6 (six) hours as needed for mild pain, moderate pain, fever or headache.     Ascorbic Acid (VITAMIN C) 1000 MG tablet Take 1,000 mg by mouth daily.     aspirin 81 MG EC tablet Take 81 mg by mouth daily.     atorvastatin (LIPITOR) 80 MG tablet Take 1 tablet (80 mg total) by mouth daily. 90 tablet 3   Fexofenadine HCl (ALLEGRA PO) Take by mouth.     fluticasone (FLONASE) 50 MCG/ACT nasal spray Place 1 spray into both nostrils daily as needed for allergies or rhinitis. Reported on 07/11/2015     glucose blood (ONE TOUCH ULTRA TEST) test strip 1 each by Other route 3 (three) times daily. And lancets 3/day 300 each 3   insulin glargine, 2 Unit Dial, (TOUJEO MAX SOLOSTAR) 300 UNIT/ML Solostar Pen Inject 80 Units into the skin at bedtime. 24 mL 3   insulin lispro  (HUMALOG KWIKPEN) 200 UNIT/ML KwikPen Inject 30 Units into the skin 3 (three) times daily with meals. And pen needles 4/day 90 mL 3   Insulin Pen Needle 32G X 4 MM MISC Use 4x a day 400 each 3   lisinopril-hydrochlorothiazide (ZESTORETIC) 20-12.5 MG tablet TAKE 1 TABLET BY MOUTH EVERY DAY 90 tablet 2   Loratadine (CLARITIN  PO) Take by mouth.     magnesium oxide (MAG-OX) 400 MG tablet Take 400 mg by mouth daily.     Multiple Vitamin (MULTIVITAMIN WITH MINERALS) TABS tablet Take 1 tablet by mouth daily.     Potassium 99 MG TABS Take 1 tablet by mouth daily.     Semaglutide, 2 MG/DOSE, (OZEMPIC, 2 MG/DOSE,) 8 MG/3ML SOPN Inject 2 mg into the skin once a week. 9 mL 3   zinc gluconate 50 MG tablet Take 50 mg by mouth every other day.     No current facility-administered medications on file prior to visit.   No Known Allergies Family History  Problem Relation Age of Onset   Breast cancer Mother    Heart disease Mother    Cancer Mother        Breast   Coronary artery disease Father        starting in his 51s. A bypass and mitral infarction   Heart disease Father        Coronary Disease, bypass and mitral infarction   Colon cancer Paternal Grandmother    PE: BP (!) 150/84 (BP Location: Right Arm, Patient Position: Sitting, Cuff Size: Normal)   Pulse (!) 106   Ht '5\' 6"'$  (1.676 m)   Wt 237 lb 6.4 oz (107.7 kg)   SpO2 95%   BMI 38.32 kg/m  Wt Readings from Last 3 Encounters:  09/14/21 237 lb 6.4 oz (107.7 kg)  09/02/21 240 lb 2 oz (108.9 kg)  05/18/21 225 lb 6.4 oz (102.2 kg)   Constitutional: overweight, in NAD Eyes: no exophthalmos ENT: moist mucous membranes, no masses palpated in neck, no cervical lymphadenopathy Cardiovascular: RRR, No MRG Respiratory: CTA B Musculoskeletal: no deformities Skin: moist, warm, no rashes Neurological: no tremor with outstretched hands Diabetic Foot Exam - Simple   Simple Foot Form Diabetic Foot exam was performed with the following findings: Yes  09/14/2021  2:28 PM  Visual Inspection No deformities, no ulcerations, no other skin breakdown bilaterally: Yes Sensation Testing Intact to touch and monofilament testing bilaterally: Yes Pulse Check Posterior Tibialis and Dorsalis pulse intact bilaterally: Yes Comments    ASSESSMENT: 1. DM2, insulin-dependent, uncontrolled, with complications - mild NPDR OD, without macular edema  2. HL  PLAN:  1. Patient with longstanding, uncontrolled, diabetes, on injectable antidiabetic regimen with basal/bolus insulin and weekly GLP-1 receptor agonist, with improving control.  At last visit HbA1c was lower, at 8.6%, decreased gradually from 14.4%.  Sugars at home are still high in the 200s to 300s but at that time she was mentioning that she was staying up at night and snacking throughout the evening including chips and other highly processed foods.  We discussed about trying to decrease and then stop the snacks but if she continued with them to cover them with a low dose of Humalog.  We also increased her Toujeo dose.  I advised her to vary the dose of Humalog depending on the size and consistency of the meal. -Today's visit, sugars remain high.  In fact, some of the blood sugars are higher than before despite increasing her Toujeo dose.  At this point, she is almost at 200 units of insulin total daily dose so we discussed that U-500 insulin is an option.  However, I first recommended to try to switch from Ozempic to [facility], since she does not feel an effect of Ozempic on her appetite and blood sugars anymore.  If the [employer] is not covered, then I advised her  to let me know and I need to send a prescription for U-500 to her pharmacy. - I suggested to:  Patient Instructions  Please continue: - Toujeo 90 units daily - Humalog 30-40 units 3x a day before meals  Please switch from Ozempic to Mounjaro 7.5 mg weekly.  Let me know if we can increase the dose of Mounjaro.   If D[employer] is not covered,  we may need to switch to U500 insulin.  Please return in 3 months with your sugar log.   - we checked her HbA1c: 10% (higher) - advised to check sugars at different times of the day - 4x a day, rotating check times - advised for yearly eye exams >> she is UTD - return to clinic in 3-4 months  2. HL -Reviewed latest lipid panel from 08/2021: Significant improvement.  Her triglycerides have been up to 1400 as before, now at goal: Lab Results  Component Value Date   CHOL 166 09/02/2021   HDL 42.90 09/02/2021   LDLCALC 99 09/02/2021   LDLDIRECT 137.0 12/10/2020   TRIG 124.0 09/02/2021   CHOLHDL 4 09/02/2021  -She continues on Lipitor 80 mg daily without side effects  Philemon Kingdom, MD PhD [facility] Endocrinology

## 2021-09-14 NOTE — Telephone Encounter (Signed)
Patient Advocate Encounter   Received notification that prior authorization is required for Catherine Moon 7.'5MG'$ /0.5ML pen-injectors  Submitted: 09-14-2021 Key [id number]  Status is pending

## 2021-09-14 NOTE — Patient Instructions (Addendum)
Please continue: - Toujeo 90 units daily - Humalog 30-40 units 3x a day before meals  Please switch from Ozempic to Mounjaro 7.5 mg weekly.  Let me know if we can increase the dose of Mounjaro.   If Catherine Moon is not covered, we may need to switch to U500 insulin.  Please return in 3 months with your sugar log.

## 2021-09-15 ENCOUNTER — Encounter: Payer: Self-pay | Admitting: Internal Medicine

## 2021-09-15 DIAGNOSIS — E113291 Type 2 diabetes mellitus with mild nonproliferative diabetic retinopathy without macular edema, right eye: Secondary | ICD-10-CM

## 2021-09-23 ENCOUNTER — Other Ambulatory Visit (HOSPITAL_COMMUNITY): Payer: Self-pay

## 2021-09-24 ENCOUNTER — Other Ambulatory Visit (HOSPITAL_COMMUNITY): Payer: Self-pay

## 2021-09-25 ENCOUNTER — Other Ambulatory Visit (HOSPITAL_COMMUNITY): Payer: Self-pay

## 2021-09-25 NOTE — Telephone Encounter (Signed)
Called insurance company to follow up on the PA. Insurance requested additional info and was faxed to them.

## 2021-09-28 ENCOUNTER — Other Ambulatory Visit (HOSPITAL_COMMUNITY): Payer: Self-pay

## 2021-09-28 NOTE — Telephone Encounter (Signed)
Patient Advocate Encounter  Prior Authorization for [employer] 7.'5MG'$ /0.5ML pen-injectors has been approved.     Effective: 09-25-2021 to 10-04-2022

## 2021-10-08 MED ORDER — HUMULIN R U-500 KWIKPEN 500 UNIT/ML ~~LOC~~ SOPN: PEN_INJECTOR | SUBCUTANEOUS | 3 refills | Status: DC

## 2021-10-08 NOTE — Telephone Encounter (Signed)

## 2021-10-09 ENCOUNTER — Other Ambulatory Visit (HOSPITAL_COMMUNITY): Payer: Self-pay

## 2021-10-09 ENCOUNTER — Telehealth: Payer: Self-pay

## 2021-10-09 NOTE — Telephone Encounter (Signed)
Patient Advocate Encounter   Received notification that prior authorization for Ozempic (2 MG/DOSE) '8MG'$ /3ML pen-injectors is required/requested.  Per Test Claim: PA is required. Also tried [employer]. It is now covered but patient's copay is $875.00 (confirmed with pharmacy)   PA submitted on 10/09/21 to [employer] via CoverMyMeds Key [id number]  Status is pending

## 2021-10-13 ENCOUNTER — Other Ambulatory Visit (HOSPITAL_COMMUNITY): Payer: Self-pay

## 2021-10-13 NOTE — Telephone Encounter (Signed)
Received a fax that the Ozempic PA was termed due to [facility] being approved and notes that the Ozempic is no longer effective.   Per test claim, our system has a e-voucher that will allow the pt to get the [facility] at a much lower cost. Office will f/u with the pt to see if she wants to transfer to one of the [employer]. If not, we'll have to try again to get the Ozempic approved or see if the pt can get manufacturer assistance.

## 2021-10-20 ENCOUNTER — Other Ambulatory Visit: Payer: Self-pay

## 2021-10-20 DIAGNOSIS — Z87891 Personal history of nicotine dependence: Secondary | ICD-10-CM

## 2021-10-20 DIAGNOSIS — Z122 Encounter for screening for malignant neoplasm of respiratory organs: Secondary | ICD-10-CM

## 2021-10-21 ENCOUNTER — Other Ambulatory Visit (HOSPITAL_COMMUNITY): Payer: Self-pay

## 2021-10-22 NOTE — Telephone Encounter (Signed)
Called and left a detailed message for pt to call back and let us know if she wants to transfer Rx to a [facility].

## 2021-10-23 ENCOUNTER — Other Ambulatory Visit (HOSPITAL_COMMUNITY): Payer: Self-pay

## 2021-10-23 MED ORDER — MOUNJARO 7.5 MG/0.5ML ~~LOC~~ SOAJ
7.5000 mg | SUBCUTANEOUS | 2 refills | Status: DC
  Filled 2021-10-23: qty 2, 28d supply, fill #0
  Filled 2021-11-18: qty 2, 28d supply, fill #1
  Filled 2021-12-16: qty 2, 28d supply, fill #2

## 2021-10-23 NOTE — Addendum Note (Signed)
Addended by: Lauralyn Primes on: 10/23/2021 08:38 AM   Modules accepted: Orders

## 2021-10-26 ENCOUNTER — Other Ambulatory Visit: Payer: Self-pay

## 2021-11-17 ENCOUNTER — Encounter: Payer: BC Managed Care – PPO | Admitting: Acute Care

## 2021-11-19 ENCOUNTER — Other Ambulatory Visit (HOSPITAL_COMMUNITY): Payer: Self-pay

## 2021-12-15 ENCOUNTER — Ambulatory Visit (INDEPENDENT_AMBULATORY_CARE_PROVIDER_SITE_OTHER): Payer: BC Managed Care – PPO | Admitting: Acute Care

## 2021-12-15 ENCOUNTER — Encounter: Payer: Self-pay | Admitting: Acute Care

## 2021-12-15 DIAGNOSIS — Z87891 Personal history of nicotine dependence: Secondary | ICD-10-CM | POA: Diagnosis not present

## 2021-12-15 NOTE — Progress Notes (Signed)
Virtual Visit via Video Note  I connected with [name] on 12/15/21 at 11:30 AM EST by a video enabled telemedicine application and verified that I am speaking with the correct person using two identifiers.  Location: Patient:  At home Provider:  [place], [place], [place], [address]    I discussed the limitations of evaluation and management by telemedicine and the availability of in person appointments. The patient expressed understanding and agreed to proceed.   Shared Decision Making Visit Lung Cancer Screening Program [phone])   Eligibility: Age 61 y.o. Pack Years Smoking History Calculation 26 pack year smoking history (# packs/per year x # years smoked) Recent History of coughing up blood  no Unexplained weight loss? no ( >Than 15 pounds within the last 6 months ) Prior History Lung / other cancer no (Diagnosis within the last 5 years already requiring surveillance chest CT Scans). Smoking Status Former Smoker Former Smokers: Years since quit: 14 years  Quit Date: 2009  Visit Components: Discussion included one or more decision making aids. yes Discussion included risk/benefits of screening. yes Discussion included potential follow up diagnostic testing for abnormal scans. yes Discussion included meaning and risk of over diagnosis. yes Discussion included meaning and risk of False Positives. yes Discussion included meaning of total radiation exposure. yes  Counseling Included: Importance of adherence to annual lung cancer LDCT screening. yes Impact of comorbidities on ability to participate in the program. yes Ability and willingness to under diagnostic treatment. yes  Smoking Cessation Counseling: Current Smokers:  Discussed importance of smoking cessation. yes Information about tobacco cessation classes and interventions provided to patient. yes Patient provided with "ticket" for LDCT Scan. yes Symptomatic Patient. no  Counseling  NA Diagnosis Code: Tobacco Use Z72.0 Asymptomatic Patient yes  Counseling (Intermediate counseling: > three minutes counseling) E9937 Former Smokers:  Discussed the importance of maintaining cigarette abstinence. yes Diagnosis Code: Personal History of Nicotine Dependence. J69.678 Information about tobacco cessation classes and interventions provided to patient. Yes Patient provided with "ticket" for LDCT Scan. yes Written Order for Lung Cancer Screening with LDCT placed in Epic. Yes (CT Chest Lung Cancer Screening Low Dose W/O CM) [id number] Z12.2-Screening of respiratory organs Z87.891-Personal history of nicotine dependence  I spent 25 minutes of face to face time/virtual visit time  with  Ms. Nachtigal discussing the risks and benefits of lung cancer screening. We took the time to pause the power point at intervals to allow for questions to be asked and answered to ensure understanding. We discussed that she had taken the single most powerful action possible to decrease her risk of developing lung cancer when she quit smoking. I counseled her to remain smoke free, and to contact me if she ever had the desire to smoke again so that I can provide resources and tools to help support the effort to remain smoke free. We discussed the time and location of the scan, and that either  Doroteo Glassman RN, Joella Prince, RN or I  or I will call / send a letter with the results within  24-72 hours of receiving them. She has the office contact information in the event she needs to speak with me,  she verbalized understanding of all of the above and had no further questions upon leaving the office.     I explained to the patient that there has been a high incidence of coronary artery disease noted on these exams. I explained that this is a non-gated exam therefore degree  or severity cannot be determined. This patient is on statin therapy. I have asked the patient to follow-up with their PCP regarding any incidental  finding of coronary artery disease and management with diet or medication as they feel is clinically indicated. The patient verbalized understanding of the above and had no further questions.     Magdalen Spatz, NP 12/15/2021

## 2021-12-15 NOTE — Patient Instructions (Signed)
Thank you for participating in the [facility]. It was our pleasure to meet you today. We will call you with the results of your scan within the next few days. Your scan will be assigned a Lung RADS category score by the physicians reading the scans.  This Lung RADS score determines follow up scanning.  See below for description of categories, and follow up screening recommendations. We will be in touch to schedule your follow up screening annually or based on recommendations of our providers. We will fax a copy of your scan results to your Primary Care Physician, or the physician who referred you to the program, to ensure they have the results. Please call the office if you have any questions or concerns regarding your scanning experience or results.  Our office number is [phone]. Please speak with Denise Phelps, RN. , or  Denise Buckner RN, They are  our Lung Cancer Screening RN.'s If They are unavailable when you call, Please leave a message on the voice mail. We will return your call at our earliest convenience.This voice mail is monitored several times a day.  Remember, if your scan is normal, we will scan you annually as long as you continue to meet the criteria for the program. (Age 55-77, Current smoker or smoker who has quit within the last 15 years). If you are a smoker, remember, quitting is the single most powerful action that you can take to decrease your risk of lung cancer and other pulmonary, breathing related problems. We know quitting is hard, and we are here to help.  Please let us know if there is anything we can do to help you meet your goal of quitting. If you are a former smoker, congratulations. We are proud of you! Remain smoke free! Remember you can refer friends or family members through the number above.  We will screen them to make sure they meet criteria for the program. Thank you for helping us take better care of you by  participating in Lung Screening.  You can receive free nicotine replacement therapy ( patches, gum or mints) by calling 1-800-QUIT NOW. Please call so we can get you on the path to becoming  a non-smoker. I know it is hard, but you can do this!  Lung RADS Categories:  Lung RADS 1: no nodules or definitely non-concerning nodules.  Recommendation is for a repeat annual scan in 12 months.  Lung RADS 2:  nodules that are non-concerning in appearance and behavior with a very low likelihood of becoming an active cancer. Recommendation is for a repeat annual scan in 12 months.  Lung RADS 3: nodules that are probably non-concerning , includes nodules with a low likelihood of becoming an active cancer.  Recommendation is for a 6-month repeat screening scan. Often noted after an upper respiratory illness. We will be in touch to make sure you have no questions, and to schedule your 6-month scan.  Lung RADS 4 A: nodules with concerning findings, recommendation is most often for a follow up scan in 3 months or additional testing based on our provider's assessment of the scan. We will be in touch to make sure you have no questions and to schedule the recommended 3 month follow up scan.  Lung RADS 4 B:  indicates findings that are concerning. We will be in touch with you to schedule additional diagnostic testing based on our provider's  assessment of the scan.  Other options for assistance in smoking cessation (   As covered by your insurance benefits)  Hypnosis for smoking cessation  [employer] [phone]  Acupuncture for smoking cessation  [facility] [phone]   

## 2021-12-17 ENCOUNTER — Ambulatory Visit
Admission: RE | Admit: 2021-12-17 | Discharge: 2021-12-17 | Disposition: A | Discharge status: Home / Self Care | Payer: BC Managed Care – PPO | Source: Ambulatory Visit | Attending: Family Medicine | Admitting: Family Medicine

## 2021-12-17 DIAGNOSIS — Z122 Encounter for screening for malignant neoplasm of respiratory organs: Secondary | ICD-10-CM

## 2021-12-17 DIAGNOSIS — Z87891 Personal history of nicotine dependence: Secondary | ICD-10-CM

## 2021-12-18 ENCOUNTER — Ambulatory Visit: Payer: BC Managed Care – PPO | Admitting: Internal Medicine

## 2021-12-18 ENCOUNTER — Encounter: Payer: Self-pay | Admitting: Internal Medicine

## 2021-12-18 VITALS — BP 140/76 | HR 102 | Ht 66.0 in | Wt 242.4 lb

## 2021-12-18 DIAGNOSIS — E785 Hyperlipidemia, unspecified: Secondary | ICD-10-CM | POA: Diagnosis not present

## 2021-12-18 DIAGNOSIS — E1169 Type 2 diabetes mellitus with other specified complication: Secondary | ICD-10-CM

## 2021-12-18 DIAGNOSIS — E113291 Type 2 diabetes mellitus with mild nonproliferative diabetic retinopathy without macular edema, right eye: Secondary | ICD-10-CM

## 2021-12-18 DIAGNOSIS — Z794 Long term (current) use of insulin: Secondary | ICD-10-CM

## 2021-12-18 LAB — POCT GLYCOSYLATED HEMOGLOBIN (HGB A1C): Hemoglobin A1C: 9.6 % — AB (ref 4.0–5.6)

## 2021-12-18 NOTE — Progress Notes (Signed)
Patient ID: Catherine Moon, female   DOB: 1960-01-23, 61 y.o.   MRN: [id number]  HPI: [name] is a 61 y.o.-year-old female, returning for follow-up for DM2, dx in 1997, insulin-dependent since 2010, uncontrolled, with complications (diabetic retinopathy).  She previously saw Dr. Loanne Drilling.  Last visit with me 3 months ago.  Interim history: No increased urination, blurry vision, nausea, chest pain. She has some fatigue and also eructations the 2nd day after taking Mounjaro.  Reviewed HbA1c: Lab Results  Component Value Date   HGBA1C 10.0 (A) 09/14/2021   HGBA1C 8.6 (A) 05/18/2021   HGBA1C 9.8 (A) 02/11/2021   HGBA1C 14.6 (A) 12/11/2020   HGBA1C 14.4 (H) 12/10/2020   HGBA1C 6.1 (A) 03/05/2020   HGBA1C 5.8 (A) 10/31/2019   HGBA1C 5.7 (A) 08/29/2019   HGBA1C 6.1 (A) 06/26/2019   HGBA1C 8.3 (A) 04/10/2019   At last visit, she was on a regimen of: - Toujeo 60 units at bedtime (2-3  am) >> 80 >> 90 units daily - Humalog 30-40 units 3x a day before meals - Ozempic 2 mg weekly   Now on: - Mounjaro 7.5 mg weekly - U500: 100 units in am, 50 units before lunch and dinner  Pt checks her sugars 2x a day and they are: - am: 200-273 >> 163, 200-280 >> 240-260 - 2h after b'fast: n/c - before lunch: n/c >> 140-160 - 2h after lunch: n/c - before dinner: n/c - 2h after dinner: up to 300 >> up to 300 >> ? - bedtime: n/c  - nighttime: n/c Lowest sugar was 209 >> 163 >> 130s; she has hypoglycemia awareness at 70.  Highest sugar was 303 >> 400 (icecream, no Humalog) >> 299.  Glucometer: One Touch ultra  - no CKD, last BUN/creatinine:  Lab Results  Component Value Date   BUN 18 09/02/2021   BUN 19 03/24/2021   CREATININE 0.69 09/02/2021   CREATININE 0.76 03/24/2021  On lisinopril 20 mg daily.  -+ Hyperlipidemia including hypertriglyceridemia; last set of lipids: Lab Results  Component Value Date   CHOL 166 09/02/2021   HDL 42.90 09/02/2021   LDLCALC 99  09/02/2021   LDLDIRECT 137.0 12/10/2020   TRIG 124.0 09/02/2021   CHOLHDL 4 09/02/2021  On Lipitor 80 mg daily.  - last eye exam was on 06/15/2021. 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 Dr. Idolina Primer.   - no numbness and tingling in her feet.  Last foot exam 09/14/2021.  On ASA 81.  ROS: + see HPI  Past Medical History:  Diagnosis Date   CTS (carpal tunnel syndrome)    DM (diabetes mellitus) ([place])    Dyslipidemia    Heart murmur    had ECHO 01-2021   HTN (hypertension)    Hypercholesterolemia    Seasonal allergies    Smoker    Warts, genital    Past Surgical History:  Procedure Laterality Date   COLONOSCOPY     CYST EXCISION Right 03/26/2021   Procedure: RIGHT AXILLARY SEBACEOUS CYST EXCISION;  Surgeon: Rolm Bookbinder, MD;  Location: [place];  Service: General;  Laterality: Right;  LOCAL   DILATION AND CURETTAGE OF UTERUS     WISDOM TOOTH EXTRACTION     Social History   Socioeconomic History   Marital status: Married    Spouse name: Not on file   Number of children: Not on file   Years of education: Not on file   Highest education  level: Associate degree: academic program  Occupational History   Not on file  Tobacco Use   Smoking status: Former    Packs/day: 1.00    Years: 26.00    Total pack years: 26.00    Types: Cigarettes    Quit date: 08/13/2007    Years since quitting: 14.3   Smokeless tobacco: Never   Tobacco comments:     She smoked one pack per day for 26 years but quit in 2009.  Substance and Sexual Activity   Alcohol use: Yes    Comment: social   Drug use: No   Sexual activity: Not on file  Other Topics Concern   Not on file  Social History Narrative   Works 3rd shift in [employer], married.  She smoked one pack per day for 26 years but quit in 2009.         Social Determinants of Health   Financial Resource Strain: Low Risk  (02/19/2021)   Overall Financial Resource  Strain (CARDIA)    Difficulty of Paying Living Expenses: Not hard at all  Food Insecurity: No Food Insecurity (02/19/2021)   Hunger Vital Sign    Worried About Running Out of Food in the Last Year: Never true    Ran Out of Food in the Last Year: Never true  Transportation Needs: No Transportation Needs (02/19/2021)   PRAPARE - Hydrologist (Medical): No    Lack of Transportation (Non-Medical): No  Physical Activity: Insufficiently Active (02/19/2021)   Exercise Vital Sign    Days of Exercise per Week: 4 days    Minutes of Exercise per Session: 20 min  Stress: Stress Concern Present (02/19/2021)   [place]    Feeling of Stress : To some extent  Social Connections: Unknown (02/19/2021)   Social Connection and Isolation Panel [NHANES]    Frequency of Communication with Friends and Family: Once a week    Frequency of Social Gatherings with Friends and Family: Patient refused    Attends Religious Services: Never    Marine scientist or Organizations: No    Attends Music therapist: Not on file    Marital Status: Married  Human resources officer Violence: Not on file   Current Outpatient Medications on File Prior to Visit  Medication Sig Dispense Refill   acetaminophen (TYLENOL) 500 MG tablet Take 1,000 mg by mouth every 6 (six) hours as needed for mild pain, moderate pain, fever or headache.     Ascorbic Acid (VITAMIN C) 1000 MG tablet Take 1,000 mg by mouth daily.     aspirin 81 MG EC tablet Take 81 mg by mouth daily.     atorvastatin (LIPITOR) 80 MG tablet Take 1 tablet (80 mg total) by mouth daily. 90 tablet 3   Fexofenadine HCl (ALLEGRA PO) Take by mouth.     fluticasone (FLONASE) 50 MCG/ACT nasal spray Place 1 spray into both nostrils daily as needed for allergies or rhinitis. Reported on 07/11/2015     glucose blood (ONE TOUCH ULTRA TEST) test strip 1 each by Other route 3 (three)  times daily. And lancets 3/day 300 each 3   insulin glargine, 2 Unit Dial, (TOUJEO MAX SOLOSTAR) 300 UNIT/ML Solostar Pen Inject 80 Units into the skin at bedtime. 24 mL 3   insulin lispro (HUMALOG KWIKPEN) 200 UNIT/ML KwikPen Inject 30 Units into the skin 3 (three) times daily with meals. And pen needles 4/day  90 mL 3   Insulin Pen Needle 32G X 4 MM MISC Use 4x a day 400 each 3   insulin regular human CONCENTRATED (HUMULIN R U-500 KWIKPEN) 500 UNIT/ML KwikPen Inject 80-100 units under skin before breakfast and 30-50 units before lunch and dinner 12 mL 3   lisinopril-hydrochlorothiazide (ZESTORETIC) 20-12.5 MG tablet TAKE 1 TABLET BY MOUTH EVERY DAY 90 tablet 2   Loratadine (CLARITIN PO) Take by mouth.     magnesium oxide (MAG-OX) 400 MG tablet Take 400 mg by mouth daily.     Multiple Vitamin (MULTIVITAMIN WITH MINERALS) TABS tablet Take 1 tablet by mouth daily.     Potassium 99 MG TABS Take 1 tablet by mouth daily.     Semaglutide, 2 MG/DOSE, (OZEMPIC, 2 MG/DOSE,) 8 MG/3ML SOPN Inject 2 mg into the skin once a week. 9 mL 3   tirzepatide (MOUNJARO) 7.5 MG/0.5ML Pen Inject 7.5 mg into the skin once a week. 2 mL 2   zinc gluconate 50 MG tablet Take 50 mg by mouth every other day.     No current facility-administered medications on file prior to visit.   No Known Allergies Family History  Problem Relation Age of Onset   Breast cancer Mother    Heart disease Mother    Cancer Mother        Breast   Coronary artery disease Father        starting in his 57s. A bypass and mitral infarction   Heart disease Father        Coronary Disease, bypass and mitral infarction   Colon cancer Paternal Grandmother    PE: BP (!) 140/76 (BP Location: Left Arm, Patient Position: Sitting, Cuff Size: Normal)   Pulse (!) 102   Ht '5\' 6"'$  (1.676 m)   Wt 242 lb 6.4 oz (110 kg)   SpO2 95%   BMI 39.12 kg/m  Wt Readings from Last 3 Encounters:  12/18/21 242 lb 6.4 oz (110 kg)  09/14/21 237 lb 6.4 oz (107.7 kg)   09/02/21 240 lb 2 oz (108.9 kg)   Constitutional: overweight, in NAD Eyes: no exophthalmos ENT: no masses palpated in neck, no cervical lymphadenopathy Cardiovascular: Tachycardia, RR, No MRG Respiratory: CTA B Musculoskeletal: no deformities Skin:no rashes Neurological: no tremor with outstretched hands  ASSESSMENT: 1. DM2, insulin-dependent, uncontrolled, with complications - mild NPDR OD, without macular edema  2. HL  PLAN:  1. Patient with longstanding, uncontrolled, type 2 diabetes, very insulin resistant, on basal bolus insulin regimen and weekly GLP-1 receptor agonist, with still poor control.  At last visit, HbA1c was higher, at 10.0%.  At that time, sugars remain very high.  I advised her to try to switch from Ozempic to [facility] but this was not affordable despite being covered by insurance.  At that time, we switched her to U-500 insulin for more potent effect on the blood sugars.  However, she was then able to start this after a PA was approved.  She now continues on Mounjaro and U-500 insulin.  She is not checking sugars consistently, but they are still elevated in the morning.  They appear to be definitely improved later in the day.  She is not taking at bedtime, which is late for her, between 2 to 3 AM and I strongly advised her to do so. -Based on the blood sugar patterns, though, I advised her to make the changes below: Increased lunchtime and dinnertime U-500 insulin.  I did advise her to change the doses  if needed if the sugars remain high. -She has eructations 2 days after injecting Mounjaro.  I advised her to try Mylanta. - I suggested to:  Patient Instructions  Please continue: - Mounjaro 7.5 mg weekly  Please adjust U500: - 100 units in am  - 65 units before lunch - 65 units before dinner  Check sugars at bedtime  Try Mylanta.  Please return in 3-4 months with your sugar log.   - we checked her HbA1c: 9.6% (slightly better, but still high) - advised to  check sugars at different times of the day - 3x a day, rotating check times - advised for yearly eye exams >> she is UTD - return to clinic in 3-4 months  2. HL -Reviewed latest lipid panel from 08/2021: Significant improvement.  Her triglycerides have been up to 1400 before, but now at goal, Lab Results  Component Value Date   CHOL 166 09/02/2021   HDL 42.90 09/02/2021   LDLCALC 99 09/02/2021   LDLDIRECT 137.0 12/10/2020   TRIG 124.0 09/02/2021   CHOLHDL 4 09/02/2021  -She continues on Lipitor 80 mg daily without side effects  Philemon Kingdom, MD PhD [facility] Endocrinology

## 2021-12-18 NOTE — Patient Instructions (Addendum)
Please continue: - Mounjaro 7.5 mg weekly  Please adjust U500: - 100 units in am  - 65 units before lunch - 65 units before dinner  Check sugars at bedtime  Try Mylanta.  Please return in 3-4 months with your sugar log.

## 2021-12-21 ENCOUNTER — Other Ambulatory Visit: Payer: Self-pay

## 2021-12-21 DIAGNOSIS — Z122 Encounter for screening for malignant neoplasm of respiratory organs: Secondary | ICD-10-CM

## 2021-12-21 DIAGNOSIS — Z87891 Personal history of nicotine dependence: Secondary | ICD-10-CM

## 2022-01-18 ENCOUNTER — Other Ambulatory Visit (HOSPITAL_COMMUNITY): Payer: Self-pay

## 2022-01-18 ENCOUNTER — Other Ambulatory Visit: Payer: Self-pay | Admitting: Internal Medicine

## 2022-01-18 DIAGNOSIS — Z794 Long term (current) use of insulin: Secondary | ICD-10-CM

## 2022-01-18 MED ORDER — MOUNJARO 7.5 MG/0.5ML ~~LOC~~ SOAJ
7.5000 mg | SUBCUTANEOUS | 2 refills | Status: DC
  Filled 2022-01-18: qty 2, 28d supply, fill #0
  Filled 2022-02-10: qty 2, 28d supply, fill #1
  Filled 2022-03-11: qty 2, 28d supply, fill #2

## 2022-01-19 ENCOUNTER — Other Ambulatory Visit (HOSPITAL_COMMUNITY): Payer: Self-pay

## 2022-01-28 ENCOUNTER — Other Ambulatory Visit: Payer: Self-pay | Admitting: Internal Medicine

## 2022-02-15 ENCOUNTER — Other Ambulatory Visit (HOSPITAL_COMMUNITY): Payer: Self-pay

## 2022-02-24 ENCOUNTER — Other Ambulatory Visit: Payer: Self-pay | Admitting: Family Medicine

## 2022-04-08 ENCOUNTER — Other Ambulatory Visit: Payer: Self-pay | Admitting: Internal Medicine

## 2022-04-08 DIAGNOSIS — Z794 Long term (current) use of insulin: Secondary | ICD-10-CM

## 2022-04-09 ENCOUNTER — Other Ambulatory Visit (HOSPITAL_COMMUNITY): Payer: Self-pay

## 2022-04-09 MED ORDER — MOUNJARO 7.5 MG/0.5ML ~~LOC~~ SOAJ
7.5000 mg | SUBCUTANEOUS | 2 refills | Status: DC
  Filled 2022-04-09: qty 2, 28d supply, fill #0
  Filled 2022-05-08: qty 2, 28d supply, fill #1
  Filled 2022-06-05: qty 2, 28d supply, fill #2

## 2022-04-19 ENCOUNTER — Ambulatory Visit: Payer: BC Managed Care – PPO | Admitting: Internal Medicine

## 2022-05-04 ENCOUNTER — Ambulatory Visit: Payer: BC Managed Care – PPO | Admitting: Internal Medicine

## 2022-05-04 ENCOUNTER — Encounter: Payer: Self-pay | Admitting: Internal Medicine

## 2022-05-04 VITALS — BP 140/58 | HR 100 | Ht 66.0 in | Wt 251.8 lb

## 2022-05-04 DIAGNOSIS — E1169 Type 2 diabetes mellitus with other specified complication: Secondary | ICD-10-CM | POA: Diagnosis not present

## 2022-05-04 DIAGNOSIS — E113291 Type 2 diabetes mellitus with mild nonproliferative diabetic retinopathy without macular edema, right eye: Secondary | ICD-10-CM | POA: Diagnosis not present

## 2022-05-04 DIAGNOSIS — E785 Hyperlipidemia, unspecified: Secondary | ICD-10-CM | POA: Diagnosis not present

## 2022-05-04 DIAGNOSIS — Z794 Long term (current) use of insulin: Secondary | ICD-10-CM | POA: Diagnosis not present

## 2022-05-04 LAB — POCT GLYCOSYLATED HEMOGLOBIN (HGB A1C): Hemoglobin A1C: 8.9 % — AB (ref 4.0–5.6)

## 2022-05-04 MED ORDER — FREESTYLE LIBRE 3 SENSOR MISC: 3 refills | Status: DC

## 2022-05-04 NOTE — Patient Instructions (Addendum)
Please continue: - Mounjaro 7.5 mg weekly  Please increase: - U500: - 100 units in am  - 90-100 units before lunch - 80-90 units before dinner  Check sugars at bedtime.  Try to get the Freestyle libre 3 CGM.  Please return in 3-4 months with your sugar log.

## 2022-05-04 NOTE — Progress Notes (Signed)
Patient ID: Catherine Moon, female   DOB: Feb 20, 1960, 62 y.o.   MRN: [id number]  HPI: Catherine Moon is a 62 y.o.-year-old female, returning for follow-up for DM2, dx in 1997, insulin-dependent since 2010, uncontrolled, with complications (diabetic retinopathy).  She previously saw Dr. Everardo All.  Last visit with me 3 months ago.  Interim history: No increased urination, blurry vision, nausea, chest pain. At last visit, she had eructations the 2nd day after taking Mounjaro.  I recommended Mylanta, however, the sxs improved.  Reviewed HbA1c: Lab Results  Component Value Date   HGBA1C 9.6 (A) 12/18/2021   HGBA1C 10.0 (A) 09/14/2021   HGBA1C 8.6 (A) 05/18/2021   HGBA1C 9.8 (A) 02/11/2021   HGBA1C 14.6 (A) 12/11/2020   HGBA1C 14.4 (H) 12/10/2020   HGBA1C 6.1 (A) 03/05/2020   HGBA1C 5.8 (A) 10/31/2019   HGBA1C 5.7 (A) 08/29/2019   HGBA1C 6.1 (A) 06/26/2019   Previously on: - Toujeo 60 units at bedtime (2-3  am) >> 80 >> 90 units daily - Humalog 30-40 units 3x a day before meals - Ozempic 2 mg weekly >> reflux were worse  Now on: - Mounjaro 7.5 mg weekly - U500: 100 units in am, 50 units before lunch and dinner >>  - 100 units in am  - 65 >> 80-90 units before lunch - 65 >> 80 units before dinner  Pt checks her sugars 2x a day and they are: - am: 200-273 >> 163, 200-280 >> 240-260 >> 121-315, ave 170-230 - 2h after b'fast: n/c - before lunch: n/c >> 140-160 >> n/c - 2h after lunch: n/c - before dinner: n/c >> 160-230 - 2h after dinner: up to 300 >> up to 300 >> 318 - bedtime: n/c  - nighttime: n/c Lowest sugar was 209 >> 163 >> 130s >> 121; she has hypoglycemia awareness at 70.  Highest sugar was 303 >> 400 (icecream, no Humalog) >> 299 >> 318.  Glucometer: One Touch ultra  - no CKD, last BUN/creatinine:  Lab Results  Component Value Date   BUN 18 09/02/2021   BUN 19 03/24/2021   CREATININE 0.69 09/02/2021   CREATININE 0.76 03/24/2021  On lisinopril 20  mg daily.  -+ Hyperlipidemia including hypertriglyceridemia; last set of lipids: Lab Results  Component Value Date   CHOL 166 09/02/2021   HDL 42.90 09/02/2021   LDLCALC 99 09/02/2021   LDLDIRECT 137.0 12/10/2020   TRIG 124.0 09/02/2021   CHOLHDL 4 09/02/2021  On Lipitor 80 mg daily.  - last eye exam was on 06/15/2021. 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 Dr. Shea Evans.   - no numbness and tingling in her feet.  Last foot exam 09/14/2021.  On ASA 81.  ROS: + see HPI  Past Medical History:  Diagnosis Date   CTS (carpal tunnel syndrome)    DM (diabetes mellitus) (HCC)    Dyslipidemia    Heart murmur    had ECHO 01-2021   HTN (hypertension)    Hypercholesterolemia    Seasonal allergies    Smoker    Warts, genital    Past Surgical History:  Procedure Laterality Date   COLONOSCOPY     CYST EXCISION Right 03/26/2021   Procedure: RIGHT AXILLARY SEBACEOUS CYST EXCISION;  Surgeon: Emelia Loron, MD;  Location: [facility];  Service: General;  Laterality: Right;  LOCAL   DILATION AND CURETTAGE OF UTERUS     WISDOM TOOTH EXTRACTION     Social History  Socioeconomic History   Marital status: Married    Spouse name: Not on file   Number of children: Not on file   Years of education: Not on file   Highest education level: Associate degree: academic program  Occupational History   Not on file  Tobacco Use   Smoking status: Former    Packs/day: 1.00    Years: 26.00    Additional pack years: 0.00    Total pack years: 26.00    Types: Cigarettes    Quit date: 08/13/2007    Years since quitting: 14.7   Smokeless tobacco: Never   Tobacco comments:     She smoked one pack per day for 26 years but quit in 2009.  Substance and Sexual Activity   Alcohol use: Yes    Comment: social   Drug use: No   Sexual activity: Not on file  Other Topics Concern   Not on file  Social History Narrative   Works 3rd shift in [employer], married.  She smoked one pack per day for 26 years but quit in 2009.         Social Determinants of Health   Financial Resource Strain: Low Risk  (02/19/2021)   Overall Financial Resource Strain (CARDIA)    Difficulty of Paying Living Expenses: Not hard at all  Food Insecurity: No Food Insecurity (02/19/2021)   Hunger Vital Sign    Worried About Running Out of Food in the Last Year: Never true    Ran Out of Food in the Last Year: Never true  Transportation Needs: No Transportation Needs (02/19/2021)   PRAPARE - Administrator, Civil Service (Medical): No    Lack of Transportation (Non-Medical): No  Physical Activity: Insufficiently Active (02/19/2021)   Exercise Vital Sign    Days of Exercise per Week: 4 days    Minutes of Exercise per Session: 20 min  Stress: Stress Concern Present (02/19/2021)   [employer] of Occupational Health - Occupational Stress Questionnaire    Feeling of Stress : To some extent  Social Connections: Unknown (02/19/2021)   Social Connection and Isolation Panel [NHANES]    Frequency of Communication with Friends and Family: Once a week    Frequency of Social Gatherings with Friends and Family: Patient declined    Attends Religious Services: Never    Database administrator or Organizations: No    Attends Engineer, structural: Not on file    Marital Status: Married  Catering manager Violence: Not on file   Current Outpatient Medications on File Prior to Visit  Medication Sig Dispense Refill   acetaminophen (TYLENOL) 500 MG tablet Take 1,000 mg by mouth every 6 (six) hours as needed for mild pain, moderate pain, fever or headache.     Ascorbic Acid (VITAMIN C) 1000 MG tablet Take 1,000 mg by mouth daily.     aspirin 81 MG EC tablet Take 81 mg by mouth daily.     atorvastatin (LIPITOR) 80 MG tablet TAKE 1 TABLET BY MOUTH EVERY DAY 90 tablet 1   Fexofenadine HCl (ALLEGRA PO) Take by mouth.     fluticasone (FLONASE) 50  MCG/ACT nasal spray Place 1 spray into both nostrils daily as needed for allergies or rhinitis. Reported on 07/11/2015     glucose blood (ONE TOUCH ULTRA TEST) test strip 1 each by Other route 3 (three) times daily. And lancets 3/day 300 each 3   Insulin Pen Needle 32G X 4 MM MISC  Use 4x a day 400 each 3   insulin regular human CONCENTRATED (HUMULIN R U-500 KWIKPEN) 500 UNIT/ML KwikPen INJECT 80-100 UNITS UNDER SKIN BEFORE BREAKFAST AND 30-50 UNITS BEFORE LUNCH AND DINNER 12 mL 3   lisinopril-hydrochlorothiazide (ZESTORETIC) 20-12.5 MG tablet TAKE 1 TABLET BY MOUTH EVERY DAY 90 tablet 2   Loratadine (CLARITIN PO) Take by mouth.     magnesium oxide (MAG-OX) 400 MG tablet Take 400 mg by mouth daily.     Multiple Vitamin (MULTIVITAMIN WITH MINERALS) TABS tablet Take 1 tablet by mouth daily.     Potassium 99 MG TABS Take 1 tablet by mouth daily.     tirzepatide (MOUNJARO) 7.5 MG/0.5ML Pen Inject 7.5 mg into the skin once a week. 2 mL 2   zinc gluconate 50 MG tablet Take 50 mg by mouth every other day.     No current facility-administered medications on file prior to visit.   No Known Allergies Family History  Problem Relation Age of Onset   Breast cancer Mother    Heart disease Mother    Cancer Mother        Breast   Coronary artery disease Father        starting in his 70s. A bypass and mitral infarction   Heart disease Father        Coronary Disease, bypass and mitral infarction   Colon cancer Paternal Grandmother    PE: BP (!) 140/58 (BP Location: Right Arm, Patient Position: Sitting, Cuff Size: Normal)   Pulse 100   Ht 5\' 6"  (1.676 m)   Wt 251 lb 12.8 oz (114.2 kg)   SpO2 98%   BMI 40.64 kg/m  Wt Readings from Last 3 Encounters:  05/04/22 251 lb 12.8 oz (114.2 kg)  12/18/21 242 lb 6.4 oz (110 kg)  09/14/21 237 lb 6.4 oz (107.7 kg)   Constitutional: overweight, in NAD Eyes: no exophthalmos ENT: no masses palpated in neck, no cervical lymphadenopathy Cardiovascular:  tachycardia, RR, No MRG Respiratory: CTA B Musculoskeletal: no deformities Skin:no rashes Neurological: no tremor with outstretched hands  ASSESSMENT: 1. DM2, insulin-dependent, uncontrolled, with complications - mild NPDR OD, without macular edema  2. HL  PLAN:  1. Patient with longstanding, uncontrolled, type 2 diabetes, very insulin resistant, previously on basal bolus insulin regimen and weekly GLP-1 receptor agonist and then GLP-1/GIP receptor agonist with still poor control.  At last visit, HbA1c was slightly lower, but still quite high, at 9.6%.  I recommended to switch to U-500 insulin and continue GLP-1/GIP receptor agonist. -At today's visit, she is not checking sugars consistently.  When she checks, they are usually higher.  She does admit to dietary indiscretions.  We discussed about the importance of cutting this down.  Also, she needs to check her blood sugars more consistently.  I again recommended a CGM.  Dexcom was not approved for her but we will try to send a freestyle libre 3 CGM to her pharmacy. -At today's visit, I advised her to check some blood sugars before lunch as otherwise it is impossible to adjust her a.m. insulin.  I recommended a slightly higher dose of U-500 insulin before lunch and also before dinner. - I suggested to:  Patient Instructions  Please continue: - Mounjaro 7.5 mg weekly  Please increase: - U500: - 100 units in am  - 90-100 units before lunch - 80-90 units before dinner  Check sugars at bedtime.  Try to get the Freestyle libre 3 CGM.  Please return in  3-4 months with your sugar log.   - we checked her HbA1c: 8.9% (lower) - advised to check sugars at different times of the day - 4x a day, rotating check times - advised for yearly eye exams >> she is UTD - return to clinic in 3-4 months  2. HL -Reviewed latest lipid panel from 08/2021: Significant improvement.  Her triglycerides have been up to 1400s before, but now at goal: Lab  Results  Component Value Date   CHOL 166 09/02/2021   HDL 42.90 09/02/2021   LDLCALC 99 09/02/2021   LDLDIRECT 137.0 12/10/2020   TRIG 124.0 09/02/2021   CHOLHDL 4 09/02/2021  -She continues on Lipitor 80 mg daily without side effects  Carlus Pavlov, MD PhD [facility]

## 2022-05-12 ENCOUNTER — Other Ambulatory Visit: Payer: Self-pay | Admitting: Internal Medicine

## 2022-05-23 ENCOUNTER — Other Ambulatory Visit: Payer: Self-pay | Admitting: Family Medicine

## 2022-05-23 DIAGNOSIS — I10 Essential (primary) hypertension: Secondary | ICD-10-CM

## 2022-06-09 ENCOUNTER — Encounter: Payer: Self-pay | Admitting: Internal Medicine

## 2022-06-09 DIAGNOSIS — E113291 Type 2 diabetes mellitus with mild nonproliferative diabetic retinopathy without macular edema, right eye: Secondary | ICD-10-CM

## 2022-06-10 MED ORDER — HUMULIN R U-500 KWIKPEN 500 UNIT/ML ~~LOC~~ SOPN: PEN_INJECTOR | SUBCUTANEOUS | 1 refills | Status: DC

## 2022-07-02 ENCOUNTER — Other Ambulatory Visit (HOSPITAL_COMMUNITY): Payer: Self-pay

## 2022-07-02 ENCOUNTER — Other Ambulatory Visit: Payer: Self-pay | Admitting: Internal Medicine

## 2022-07-02 DIAGNOSIS — Z794 Long term (current) use of insulin: Secondary | ICD-10-CM

## 2022-07-02 MED ORDER — MOUNJARO 7.5 MG/0.5ML ~~LOC~~ SOAJ
7.5000 mg | SUBCUTANEOUS | 3 refills | Status: DC
  Filled 2022-07-02: qty 2, 28d supply, fill #0
  Filled 2022-07-30: qty 2, 28d supply, fill #1
  Filled 2022-08-28: qty 2, 28d supply, fill #2

## 2022-07-05 ENCOUNTER — Other Ambulatory Visit (HOSPITAL_COMMUNITY): Payer: Self-pay

## 2022-07-22 LAB — HM DIABETES EYE EXAM

## 2022-07-23 ENCOUNTER — Encounter: Payer: Self-pay | Admitting: Internal Medicine

## 2022-08-03 ENCOUNTER — Encounter: Payer: Self-pay | Admitting: Internal Medicine

## 2022-08-20 ENCOUNTER — Other Ambulatory Visit: Payer: Self-pay | Admitting: Internal Medicine

## 2022-08-20 DIAGNOSIS — Z794 Long term (current) use of insulin: Secondary | ICD-10-CM

## 2022-09-14 ENCOUNTER — Encounter: Payer: Self-pay | Admitting: Internal Medicine

## 2022-09-14 ENCOUNTER — Other Ambulatory Visit (HOSPITAL_COMMUNITY): Payer: Self-pay

## 2022-09-14 ENCOUNTER — Ambulatory Visit: Payer: BC Managed Care – PPO | Admitting: Internal Medicine

## 2022-09-14 VITALS — BP 138/70 | HR 107 | Ht 66.0 in | Wt 252.0 lb

## 2022-09-14 DIAGNOSIS — Z7985 Long-term (current) use of injectable non-insulin antidiabetic drugs: Secondary | ICD-10-CM | POA: Diagnosis not present

## 2022-09-14 DIAGNOSIS — E113291 Type 2 diabetes mellitus with mild nonproliferative diabetic retinopathy without macular edema, right eye: Secondary | ICD-10-CM | POA: Diagnosis not present

## 2022-09-14 DIAGNOSIS — Z794 Long term (current) use of insulin: Secondary | ICD-10-CM

## 2022-09-14 DIAGNOSIS — E785 Hyperlipidemia, unspecified: Secondary | ICD-10-CM

## 2022-09-14 DIAGNOSIS — E1169 Type 2 diabetes mellitus with other specified complication: Secondary | ICD-10-CM

## 2022-09-14 LAB — HEMOGLOBIN A1C: Hemoglobin A1C: 8.8

## 2022-09-14 MED ORDER — FREESTYLE LIBRE 3 SENSOR MISC
3 refills | Status: DC
Start: 2022-09-14 — End: 2022-12-20
  Filled 2022-09-14: qty 6, 14d supply, fill #0
  Filled 2022-10-31: qty 6, 84d supply, fill #0

## 2022-09-14 MED ORDER — TIRZEPATIDE 10 MG/0.5ML ~~LOC~~ SOAJ
10.0000 mg | SUBCUTANEOUS | 3 refills | Status: DC
  Filled 2022-09-14 – 2022-09-15 (×2): qty 2, 28d supply, fill #0
  Filled 2022-10-21: qty 2, 28d supply, fill #1
  Filled 2022-11-18: qty 2, 28d supply, fill #2
  Filled 2022-12-30: qty 2, 28d supply, fill #3
  Filled 2023-01-28: qty 2, 28d supply, fill #4
  Filled 2023-02-24: qty 2, 28d supply, fill #5
  Filled 2023-03-25: qty 2, 28d supply, fill #6
  Filled 2023-04-21: qty 2, 28d supply, fill #7
  Filled 2023-05-20: qty 2, 28d supply, fill #8
  Filled 2023-06-17: qty 2, 28d supply, fill #9
  Filled 2023-07-22 – 2023-08-05 (×4): qty 2, 28d supply, fill #10

## 2022-09-14 MED ORDER — HUMULIN R U-500 KWIKPEN 500 UNIT/ML ~~LOC~~ SOPN
80.0000 [IU] | PEN_INJECTOR | Freq: Three times a day (TID) | SUBCUTANEOUS | 3 refills | Status: DC
  Filled 2022-09-14 – 2022-10-14 (×6): qty 18, 30d supply, fill #0
  Filled 2022-11-24 – 2022-12-30 (×2): qty 18, 30d supply, fill #1

## 2022-09-14 NOTE — Patient Instructions (Addendum)
Please increase: - Mounjaro 10 mg weekly  Continue: - U500: - 100 units in am  - 90-100 units before lunch - 80-90 units before dinner  Check sugars at bedtime.  Try to get the Freestyle libre 3 CGM.  Please return in 3-4 months with your sugar log.

## 2022-09-14 NOTE — Progress Notes (Unsigned)
Patient ID: Catherine Moon, female   DOB: Aug 31, 1960, 62 y.o.   MRN: [id number]  HPI: Catherine Moon is a 62 y.o.-year-old female, returning for follow-up for DM2, dx in 1997, insulin-dependent since 2010, uncontrolled, with complications (diabetic retinopathy).  She previously saw Dr. Everardo All.  Last visit with me 4 months ago.  Interim history: No increased urination, blurry vision, nausea, chest pain. She prev. had eructations the 2nd day after taking Mounjaro.  I recommended Mylanta, however, the sxs resolved. She is staying up late and wakes up late.  She works from home.  Reviewed HbA1c: Lab Results  Component Value Date   HGBA1C 8.9 (A) 05/04/2022   HGBA1C 9.6 (A) 12/18/2021   HGBA1C 10.0 (A) 09/14/2021   HGBA1C 8.6 (A) 05/18/2021   HGBA1C 9.8 (A) 02/11/2021   HGBA1C 14.6 (A) 12/11/2020   HGBA1C 14.4 (H) 12/10/2020   HGBA1C 6.1 (A) 03/05/2020   HGBA1C 5.8 (A) 10/31/2019   HGBA1C 5.7 (A) 08/29/2019   Previously on: - Toujeo 60 units at bedtime (2-3  am) >> 80 >> 90 units daily - Humalog 30-40 units 3x a day before meals - Ozempic 2 mg weekly >> reflux worse  Now on: - Mounjaro 7.5 mg weekly - U500: 100 units in am, 50 units before lunch and dinner >>  - 100 units in am  - 90-100 units before lunch - 80-90 units before dinner She was on Metformin >> GI distress.  Pt checks her sugars 2x a day and they are: - am: 200-273 >> 163, 200-280 >> 240-260 >> 121-315, ave 170-230 >> 165-228, 336 - 2h after b'fast: n/c - before lunch: n/c >> 140-160 >> n/c  - 2h after lunch: n/c - before dinner: n/c >> 160-230 >> 85, 166-284 - 2h after dinner: up to 300 >> up to 300 >> 318 >> n/c - bedtime: n/c  - nighttime: n/c Lowest sugar was 209 >> 163 >> 130s >> 121 >> 85; she has hypoglycemia awareness at 70.  Highest sugar was 400 (icecream, no Humalog) >> 299 >> 318 >> 336.  Glucometer: One Touch ultra  - no CKD, last BUN/creatinine:  Lab Results  Component Value  Date   BUN 18 09/02/2021   BUN 19 03/24/2021   CREATININE 0.69 09/02/2021   CREATININE 0.76 03/24/2021   Lab Results  Component Value Date   MICRALBCREAT 6.9 12/10/2020   MICRALBCREAT 1.4 05/22/2019   MICRALBCREAT 2.0 01/15/2016   MICRALBCREAT 0.9 11/01/2014   MICRALBCREAT 0.7 03/14/2013   MICRALBCREAT 0.6 09/20/2011   MICRALBCREAT 2.1 11/18/2009   MICRALBCREAT 6.9 11/05/2005  On lisinopril 20 mg daily.  -+ Hyperlipidemia including hypertriglyceridemia; last set of lipids: Lab Results  Component Value Date   CHOL 166 09/02/2021   HDL 42.90 09/02/2021   LDLCALC 99 09/02/2021   LDLDIRECT 137.0 12/10/2020   TRIG 124.0 09/02/2021   CHOLHDL 4 09/02/2021  On Lipitor 80 mg daily.  - last eye exam was on  07/23/2022. 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 Dr. Shea Evans.   - no numbness and tingling in her feet.  Last foot exam 09/14/2021.  On ASA 81.  ROS: + see HPI  Past Medical History:  Diagnosis Date   CTS (carpal tunnel syndrome)    DM (diabetes mellitus) (HCC)    Dyslipidemia    Heart murmur    had ECHO 01-2021   HTN (hypertension)    Hypercholesterolemia    Seasonal allergies    Smoker    Warts, genital  Past Surgical History:  Procedure Laterality Date   COLONOSCOPY     CYST EXCISION Right 03/26/2021   Procedure: RIGHT AXILLARY SEBACEOUS CYST EXCISION;  Surgeon: Emelia Loron, MD;  Location: [facility];  Service: General;  Laterality: Right;  LOCAL   DILATION AND CURETTAGE OF UTERUS     WISDOM TOOTH EXTRACTION     Social History   Socioeconomic History   Marital status: Married    Spouse name: Not on file   Number of children: Not on file   Years of education: Not on file   Highest education level: Associate degree: academic program  Occupational History   Not on file  Tobacco Use   Smoking status: Former    Current packs/day: 0.00    Average packs/day: 1 pack/day for 26.0 years (26.0 ttl pk-yrs)    Types: Cigarettes    Start  date: 08/12/1981    Quit date: 08/13/2007    Years since quitting: 15.0   Smokeless tobacco: Never   Tobacco comments:     She smoked one pack per day for 26 years but quit in 2009.  Substance and Sexual Activity   Alcohol use: Yes    Comment: social   Drug use: No   Sexual activity: Not on file  Other Topics Concern   Not on file  Social History Narrative   Works 3rd shift in [employer], married.  She smoked one pack per day for 26 years but quit in 2009.         Social Determinants of Health   Financial Resource Strain: Low Risk  (02/19/2021)   Overall Financial Resource Strain (CARDIA)    Difficulty of Paying Living Expenses: Not hard at all  Food Insecurity: No Food Insecurity (02/19/2021)   Hunger Vital Sign    Worried About Running Out of Food in the Last Year: Never true    Ran Out of Food in the Last Year: Never true  Transportation Needs: No Transportation Needs (02/19/2021)   PRAPARE - Administrator, Civil Service (Medical): No    Lack of Transportation (Non-Medical): No  Physical Activity: Insufficiently Active (02/19/2021)   Exercise Vital Sign    Days of Exercise per Week: 4 days    Minutes of Exercise per Session: 20 min  Stress: Stress Concern Present (02/19/2021)   [employer] of Occupational Health - Occupational Stress Questionnaire    Feeling of Stress : To some extent  Social Connections: Unknown (02/19/2021)   Social Connection and Isolation Panel [NHANES]    Frequency of Communication with Friends and Family: Once a week    Frequency of Social Gatherings with Friends and Family: Patient declined    Attends Religious Services: Never    Database administrator or Organizations: No    Attends Engineer, structural: Not on file    Marital Status: Married  Catering manager Violence: Not on file   Current Outpatient Medications on File Prior to Visit  Medication Sig Dispense Refill   acetaminophen (TYLENOL) 500 MG tablet  Take 1,000 mg by mouth every 6 (six) hours as needed for mild pain, moderate pain, fever or headache.     Ascorbic Acid (VITAMIN C) 1000 MG tablet Take 1,000 mg by mouth daily.     aspirin 81 MG EC tablet Take 81 mg by mouth daily.     atorvastatin (LIPITOR) 80 MG tablet TAKE 1 TABLET BY MOUTH EVERY DAY 90 tablet 1   Continuous Glucose  Sensor (FREESTYLE LIBRE 3 SENSOR) MISC Place 1 sensor on the skin every 14 days. Use to check glucose continuously 6 each 3   Fexofenadine HCl (ALLEGRA PO) Take by mouth.     fluticasone (FLONASE) 50 MCG/ACT nasal spray Place 1 spray into both nostrils daily as needed for allergies or rhinitis. Reported on 07/11/2015     glucose blood (ONE TOUCH ULTRA TEST) test strip 1 each by Other route 3 (three) times daily. And lancets 3/day 300 each 3   Insulin Pen Needle 32G X 4 MM MISC Use 4x a day 400 each 3   insulin regular human CONCENTRATED (HUMULIN R U-500 KWIKPEN) 500 UNIT/ML KwikPen INJECT 80-100 UNITS UNDER SKIN BEFORE BREAKFAST AND 80-90 UNITS BEFORE LUNCH AND DINNER 12 mL 2   lisinopril-hydrochlorothiazide (ZESTORETIC) 20-12.5 MG tablet TAKE 1 TABLET BY MOUTH EVERY DAY 90 tablet 1   Loratadine (CLARITIN PO) Take by mouth.     magnesium oxide (MAG-OX) 400 MG tablet Take 400 mg by mouth daily.     Multiple Vitamin (MULTIVITAMIN WITH MINERALS) TABS tablet Take 1 tablet by mouth daily.     Potassium 99 MG TABS Take 1 tablet by mouth daily.     tirzepatide (MOUNJARO) 7.5 MG/0.5ML Pen Inject 7.5 mg into the skin once a week. 6 mL 3   zinc gluconate 50 MG tablet Take 50 mg by mouth every other day.     No current facility-administered medications on file prior to visit.   No Known Allergies Family History  Problem Relation Age of Onset   Breast cancer Mother    Heart disease Mother    Cancer Mother        Breast   Coronary artery disease Father        starting in his 29s. A bypass and mitral infarction   Heart disease Father        Coronary Disease, bypass and  mitral infarction   Colon cancer Paternal Grandmother    PE: BP 138/70   Pulse (!) 107   Ht 5\' 6"  (1.676 m)   Wt 252 lb (114.3 kg)   SpO2 95%   BMI 40.67 kg/m  Wt Readings from Last 3 Encounters:  09/14/22 252 lb (114.3 kg)  05/04/22 251 lb 12.8 oz (114.2 kg)  12/18/21 242 lb 6.4 oz (110 kg)   Constitutional: overweight, in NAD Eyes: no exophthalmos ENT: no masses palpated in neck, no cervical lymphadenopathy Cardiovascular: tachycardia, RR, No MRG, + mild pitting edema LE edema Respiratory: CTA B Musculoskeletal: no deformities Skin:no rashes Neurological: no tremor with outstretched hands  ASSESSMENT: 1. DM2, insulin-dependent, uncontrolled, with complications - mild NPDR OD, without macular edema  2. HL  PLAN:  1. Patient with longstanding, uncontrolled, type 2 diabetes, very insulin resistant, previously on basal/bolus insulin regimen and weekly GLP-1 receptor agonist, now on U-500 concentrated insulin and GLP-1/GIP receptor agonist, with still poor control.  At last visit, HbA1c was lower, but still above target, at 8.9%.  At that time, we adjusted her U-500 insulin doses before lunch and dinner, I advised her to also check blood sugars at bedtime and recommended a freestyle libre 3 CGM.  I sent a prescription for this to her pharmacy.  She was having dietary indiscretions and we discussed about trying to reduce these. - at today's OV, she is not on the CGM - she did not get it from the pharmacy (?).  I again strongly advised her to try to obtain it-I sent a prescription  to [facility]. - She did not check sugars at home, at waking up and before dinner, but she is staying up late and we do not have sugars at that time.  Blood sugars overall appear to be quite fluctuating and is difficult to discern trends.  Getting a CGM would greatly help.  For now, my suggestion would be to increase the Mounjaro dose, as she is tolerating the current dose well.  Will continue the  rest of the regimen. - I suggested to:  Patient Instructions  Please increase: - Mounjaro 10 mg weekly  Continue: - U500: - 100 units in am  - 90-100 units before lunch - 80-90 units before dinner  Check sugars at bedtime.  Try to get the Freestyle libre 3 CGM.  Please return in 3-4 months with your sugar log.   - we checked her HbA1c: 8.8% (slightly better) - advised to check sugars at different times of the day - 4x a day, rotating check times - advised for yearly eye exams >> she is UTD - will check annual labs today - return to clinic in 3-4 months  2. HL -Latest lipid panel from 08/2021 showed significant improvement.  Triglycerides have been up to 1400 in the past. Lab Results  Component Value Date   CHOL 166 09/02/2021   HDL 42.90 09/02/2021   LDLCALC 99 09/02/2021   LDLDIRECT 137.0 12/10/2020   TRIG 124.0 09/02/2021   CHOLHDL 4 09/02/2021  -She continues on Lipitor 80 mg daily without side effects - will check another lipid level today  Carlus Pavlov, MD PhD [facility]

## 2022-09-15 ENCOUNTER — Other Ambulatory Visit (HOSPITAL_COMMUNITY): Payer: Self-pay

## 2022-09-15 ENCOUNTER — Other Ambulatory Visit: Payer: Self-pay

## 2022-09-15 LAB — COMPREHENSIVE METABOLIC PANEL
ALT: 22 U/L (ref 0–35)
AST: 19 U/L (ref 0–37)
Albumin: 4.1 g/dL (ref 3.5–5.2)
Alkaline Phosphatase: 90 U/L (ref 39–117)
BUN: 16 mg/dL (ref 6–23)
CO2: 27 meq/L (ref 19–32)
Calcium: 9.6 mg/dL (ref 8.4–10.5)
Chloride: 101 meq/L (ref 96–112)
Creatinine, Ser: 0.87 mg/dL (ref 0.40–1.20)
GFR: 71.8 mL/min (ref >60.00)
Glucose, Bld: 265 mg/dL — ABNORMAL HIGH (ref 70–99)
Potassium: 4.3 meq/L (ref 3.5–5.1)
Sodium: 138 meq/L (ref 135–145)
Total Bilirubin: 0.6 mg/dL (ref 0.2–1.2)
Total Protein: 6.9 g/dL (ref 6.0–8.3)

## 2022-09-15 LAB — LIPID PANEL
Cholesterol: 176 mg/dL (ref 0–200)
HDL: 39.3 mg/dL (ref >39.00)
LDL Cholesterol: 105 mg/dL — ABNORMAL HIGH (ref 0–99)
NonHDL: 136.38
Total CHOL/HDL Ratio: 4
Triglycerides: 159 mg/dL — ABNORMAL HIGH (ref 0.0–149.0)
VLDL: 31.8 mg/dL (ref 0.0–40.0)

## 2022-09-15 LAB — MICROALBUMIN / CREATININE URINE RATIO
Creatinine,U: 69.2 mg/dL
Microalb Creat Ratio: 4 mg/g (ref 0.0–30.0)
Microalb, Ur: 2.8 mg/dL — ABNORMAL HIGH (ref 0.0–1.9)

## 2022-09-18 ENCOUNTER — Encounter: Payer: Self-pay | Admitting: Internal Medicine

## 2022-09-19 ENCOUNTER — Other Ambulatory Visit (HOSPITAL_COMMUNITY): Payer: Self-pay

## 2022-09-20 ENCOUNTER — Other Ambulatory Visit (HOSPITAL_COMMUNITY): Payer: Self-pay

## 2022-09-20 MED ORDER — EZETIMIBE 10 MG PO TABS: 10.0000 mg | ORAL_TABLET | Freq: Every day | ORAL | 3 refills | Status: DC

## 2022-09-20 NOTE — Telephone Encounter (Signed)
Pt agrees with Zetia please advise which dosage

## 2022-10-01 ENCOUNTER — Other Ambulatory Visit (HOSPITAL_COMMUNITY): Payer: Self-pay

## 2022-10-01 ENCOUNTER — Other Ambulatory Visit: Payer: Self-pay | Admitting: Family Medicine

## 2022-10-13 ENCOUNTER — Other Ambulatory Visit (HOSPITAL_COMMUNITY): Payer: Self-pay

## 2022-10-15 ENCOUNTER — Other Ambulatory Visit (HOSPITAL_COMMUNITY): Payer: Self-pay

## 2022-10-20 ENCOUNTER — Other Ambulatory Visit: Payer: Self-pay

## 2022-10-20 ENCOUNTER — Other Ambulatory Visit (HOSPITAL_COMMUNITY): Payer: Self-pay

## 2022-10-21 ENCOUNTER — Encounter: Payer: Self-pay | Admitting: Internal Medicine

## 2022-10-21 ENCOUNTER — Other Ambulatory Visit (HOSPITAL_COMMUNITY): Payer: Self-pay

## 2022-10-22 ENCOUNTER — Telehealth: Payer: Self-pay

## 2022-10-22 ENCOUNTER — Other Ambulatory Visit (HOSPITAL_COMMUNITY): Payer: Self-pay

## 2022-10-22 NOTE — Telephone Encounter (Signed)
Pharmacy Patient Advocate Encounter   Received notification from Pt Calls Messages that prior authorization for Humulin U500 is required/requested.   Per test claim: PA required; PA submitted to [employer] via CoverMyMeds Key/confirmation #/EOC [id number] Status is pending

## 2022-10-22 NOTE — Telephone Encounter (Signed)
Patient is in the need of a Urgent PA for Humulin U-500.

## 2022-10-25 ENCOUNTER — Other Ambulatory Visit: Payer: Self-pay

## 2022-10-25 ENCOUNTER — Other Ambulatory Visit (HOSPITAL_COMMUNITY): Payer: Self-pay

## 2022-10-26 ENCOUNTER — Other Ambulatory Visit (HOSPITAL_COMMUNITY): Payer: Self-pay

## 2022-10-29 NOTE — Telephone Encounter (Signed)
Pharmacy Patient Advocate Encounter  Received notification from  [employer]  that Prior Authorization for Humulin has been APPROVED through 10/21/2023

## 2022-11-01 ENCOUNTER — Other Ambulatory Visit: Payer: Self-pay

## 2022-11-05 ENCOUNTER — Other Ambulatory Visit (HOSPITAL_COMMUNITY): Payer: Self-pay

## 2022-11-05 DIAGNOSIS — I2510 Atherosclerotic heart disease of native coronary artery without angina pectoris: Secondary | ICD-10-CM

## 2022-11-05 HISTORY — DX: Atherosclerotic heart disease of native coronary artery without angina pectoris: I25.10

## 2022-11-25 ENCOUNTER — Encounter (HOSPITAL_COMMUNITY): Payer: Self-pay

## 2022-11-25 ENCOUNTER — Emergency Department (HOSPITAL_COMMUNITY): Payer: BC Managed Care – PPO

## 2022-11-25 ENCOUNTER — Ambulatory Visit: Payer: BC Managed Care – PPO | Admitting: Family Medicine

## 2022-11-25 ENCOUNTER — Other Ambulatory Visit: Payer: Self-pay

## 2022-11-25 ENCOUNTER — Inpatient Hospital Stay (HOSPITAL_COMMUNITY)
Admission: EM | Admit: 2022-11-25 | Discharge: 2022-12-04 | DRG: 234 | Disposition: A | Discharge status: Home / Self Care | Payer: BC Managed Care – PPO | Source: Ambulatory Visit | Attending: Thoracic Surgery (Cardiothoracic Vascular Surgery) | Admitting: Thoracic Surgery (Cardiothoracic Vascular Surgery)

## 2022-11-25 VITALS — BP 200/90 | HR 132 | Temp 99.2°F | Ht 66.0 in

## 2022-11-25 DIAGNOSIS — E785 Hyperlipidemia, unspecified: Secondary | ICD-10-CM | POA: Diagnosis present

## 2022-11-25 DIAGNOSIS — I200 Unstable angina: Secondary | ICD-10-CM | POA: Diagnosis not present

## 2022-11-25 DIAGNOSIS — N950 Postmenopausal bleeding: Secondary | ICD-10-CM

## 2022-11-25 DIAGNOSIS — E66812 Obesity, class 2: Secondary | ICD-10-CM | POA: Diagnosis present

## 2022-11-25 DIAGNOSIS — R011 Cardiac murmur, unspecified: Secondary | ICD-10-CM

## 2022-11-25 DIAGNOSIS — Z794 Long term (current) use of insulin: Secondary | ICD-10-CM | POA: Diagnosis not present

## 2022-11-25 DIAGNOSIS — Z79899 Other long term (current) drug therapy: Secondary | ICD-10-CM | POA: Diagnosis not present

## 2022-11-25 DIAGNOSIS — J9811 Atelectasis: Secondary | ICD-10-CM | POA: Diagnosis not present

## 2022-11-25 DIAGNOSIS — I252 Old myocardial infarction: Secondary | ICD-10-CM

## 2022-11-25 DIAGNOSIS — E7800 Pure hypercholesterolemia, unspecified: Secondary | ICD-10-CM | POA: Diagnosis present

## 2022-11-25 DIAGNOSIS — R7989 Other specified abnormal findings of blood chemistry: Secondary | ICD-10-CM

## 2022-11-25 DIAGNOSIS — I25110 Atherosclerotic heart disease of native coronary artery with unstable angina pectoris: Secondary | ICD-10-CM | POA: Diagnosis present

## 2022-11-25 DIAGNOSIS — I214 Non-ST elevation (NSTEMI) myocardial infarction: Secondary | ICD-10-CM | POA: Diagnosis present

## 2022-11-25 DIAGNOSIS — I5032 Chronic diastolic (congestive) heart failure: Secondary | ICD-10-CM | POA: Diagnosis present

## 2022-11-25 DIAGNOSIS — D500 Iron deficiency anemia secondary to blood loss (chronic): Secondary | ICD-10-CM | POA: Diagnosis present

## 2022-11-25 DIAGNOSIS — Z7982 Long term (current) use of aspirin: Secondary | ICD-10-CM | POA: Diagnosis not present

## 2022-11-25 DIAGNOSIS — E1165 Type 2 diabetes mellitus with hyperglycemia: Secondary | ICD-10-CM | POA: Diagnosis not present

## 2022-11-25 DIAGNOSIS — E119 Type 2 diabetes mellitus without complications: Secondary | ICD-10-CM | POA: Diagnosis not present

## 2022-11-25 DIAGNOSIS — E1169 Type 2 diabetes mellitus with other specified complication: Secondary | ICD-10-CM | POA: Diagnosis present

## 2022-11-25 DIAGNOSIS — Z803 Family history of malignant neoplasm of breast: Secondary | ICD-10-CM

## 2022-11-25 DIAGNOSIS — I161 Hypertensive emergency: Secondary | ICD-10-CM | POA: Diagnosis not present

## 2022-11-25 DIAGNOSIS — Z8249 Family history of ischemic heart disease and other diseases of the circulatory system: Secondary | ICD-10-CM | POA: Diagnosis not present

## 2022-11-25 DIAGNOSIS — I10 Essential (primary) hypertension: Secondary | ICD-10-CM | POA: Diagnosis not present

## 2022-11-25 DIAGNOSIS — I160 Hypertensive urgency: Secondary | ICD-10-CM | POA: Diagnosis present

## 2022-11-25 DIAGNOSIS — D72829 Elevated white blood cell count, unspecified: Secondary | ICD-10-CM | POA: Diagnosis not present

## 2022-11-25 DIAGNOSIS — R091 Pleurisy: Secondary | ICD-10-CM | POA: Diagnosis not present

## 2022-11-25 DIAGNOSIS — Z951 Presence of aortocoronary bypass graft: Secondary | ICD-10-CM

## 2022-11-25 DIAGNOSIS — Z7985 Long-term (current) use of injectable non-insulin antidiabetic drugs: Secondary | ICD-10-CM

## 2022-11-25 DIAGNOSIS — I6523 Occlusion and stenosis of bilateral carotid arteries: Secondary | ICD-10-CM | POA: Diagnosis present

## 2022-11-25 DIAGNOSIS — Z800 Family history of malignant neoplasm of digestive organs: Secondary | ICD-10-CM

## 2022-11-25 DIAGNOSIS — Z01810 Encounter for preprocedural cardiovascular examination: Secondary | ICD-10-CM | POA: Diagnosis not present

## 2022-11-25 DIAGNOSIS — R072 Precordial pain: Secondary | ICD-10-CM | POA: Diagnosis present

## 2022-11-25 DIAGNOSIS — D519 Vitamin B12 deficiency anemia, unspecified: Secondary | ICD-10-CM | POA: Diagnosis present

## 2022-11-25 DIAGNOSIS — Q2381 Bicuspid aortic valve: Secondary | ICD-10-CM | POA: Diagnosis not present

## 2022-11-25 DIAGNOSIS — I110 Hypertensive heart disease with heart failure: Secondary | ICD-10-CM | POA: Diagnosis present

## 2022-11-25 DIAGNOSIS — Z87891 Personal history of nicotine dependence: Secondary | ICD-10-CM | POA: Diagnosis not present

## 2022-11-25 DIAGNOSIS — I350 Nonrheumatic aortic (valve) stenosis: Secondary | ICD-10-CM | POA: Diagnosis not present

## 2022-11-25 DIAGNOSIS — R079 Chest pain, unspecified: Secondary | ICD-10-CM

## 2022-11-25 DIAGNOSIS — I5030 Unspecified diastolic (congestive) heart failure: Secondary | ICD-10-CM | POA: Diagnosis present

## 2022-11-25 DIAGNOSIS — I2510 Atherosclerotic heart disease of native coronary artery without angina pectoris: Secondary | ICD-10-CM | POA: Diagnosis not present

## 2022-11-25 DIAGNOSIS — E88819 Insulin resistance, unspecified: Secondary | ICD-10-CM | POA: Diagnosis present

## 2022-11-25 DIAGNOSIS — Z6841 Body Mass Index (BMI) 40.0 and over, adult: Secondary | ICD-10-CM | POA: Diagnosis not present

## 2022-11-25 LAB — CBC
HCT: 25.2 % — ABNORMAL LOW (ref 36.0–46.0)
Hemoglobin: 7.9 g/dL — ABNORMAL LOW (ref 12.0–15.0)
MCH: 32 pg (ref 26.0–34.0)
MCHC: 31.3 g/dL (ref 30.0–36.0)
MCV: 102 fL — ABNORMAL HIGH (ref 80.0–100.0)
Platelets: 439 10*3/uL — ABNORMAL HIGH (ref 150–400)
RBC: 2.47 MIL/uL — ABNORMAL LOW (ref 3.87–5.11)
RDW: 18 % — ABNORMAL HIGH (ref 11.5–15.5)
WBC: 14.3 10*3/uL — ABNORMAL HIGH (ref 4.0–10.5)
nRBC: 0.4 % — ABNORMAL HIGH (ref 0.0–0.2)

## 2022-11-25 LAB — BASIC METABOLIC PANEL
Anion gap: 9 (ref 5–15)
BUN: 10 mg/dL (ref 8–23)
CO2: 23 mmol/L (ref 22–32)
Calcium: 9.5 mg/dL (ref 8.9–10.3)
Chloride: 103 mmol/L (ref 98–111)
Creatinine, Ser: 0.88 mg/dL (ref 0.44–1.00)
GFR, Estimated: >60 mL/min (ref >60)
Glucose, Bld: 316 mg/dL — ABNORMAL HIGH (ref 70–99)
Potassium: 3.9 mmol/L (ref 3.5–5.1)
Sodium: 135 mmol/L (ref 135–145)

## 2022-11-25 LAB — TROPONIN I (HIGH SENSITIVITY)
Troponin I (High Sensitivity): 138 ng/L (ref <18)
Troponin I (High Sensitivity): 140 ng/L (ref <18)

## 2022-11-25 LAB — GLUCOSE, CAPILLARY: Glucose-Capillary: 192 mg/dL — ABNORMAL HIGH (ref 70–99)

## 2022-11-25 MED ORDER — EZETIMIBE 10 MG PO TABS
10.0000 mg | ORAL_TABLET | Freq: Every day | ORAL | Status: DC
  Administered 2022-11-26 – 2022-12-04 (×8): 10 mg via ORAL
  Filled 2022-11-25 (×8): qty 1

## 2022-11-25 MED ORDER — LORATADINE 10 MG PO TABS
10.0000 mg | ORAL_TABLET | Freq: Every day | ORAL | Status: DC
  Administered 2022-11-26 – 2022-11-29 (×4): 10 mg via ORAL
  Filled 2022-11-25 (×4): qty 1

## 2022-11-25 MED ORDER — IOHEXOL 300 MG/ML  SOLN
90.0000 mL | Freq: Once | INTRAMUSCULAR | Status: AC | PRN
  Administered 2022-11-25: 90 mL via INTRAVENOUS

## 2022-11-25 MED ORDER — HYDROCHLOROTHIAZIDE 12.5 MG PO TABS
12.5000 mg | ORAL_TABLET | Freq: Every day | ORAL | Status: DC
  Administered 2022-11-26 – 2022-11-29 (×4): 12.5 mg via ORAL
  Filled 2022-11-25 (×4): qty 1

## 2022-11-25 MED ORDER — LISINOPRIL 20 MG PO TABS
20.0000 mg | ORAL_TABLET | Freq: Every day | ORAL | Status: DC
  Administered 2022-11-26 – 2022-11-28 (×3): 20 mg via ORAL
  Filled 2022-11-25 (×3): qty 1

## 2022-11-25 MED ORDER — NITROGLYCERIN 2 % TD OINT: 0.5000 [in_us] | TOPICAL_OINTMENT | Freq: Four times a day (QID) | TRANSDERMAL | Status: DC | PRN

## 2022-11-25 MED ORDER — SODIUM CHLORIDE 0.9% FLUSH
3.0000 mL | Freq: Two times a day (BID) | INTRAVENOUS | Status: DC
  Administered 2022-11-26 – 2022-11-29 (×3): 3 mL via INTRAVENOUS

## 2022-11-25 MED ORDER — FERROUS SULFATE 325 (65 FE) MG PO TABS
325.0000 mg | ORAL_TABLET | Freq: Every day | ORAL | Status: DC
  Administered 2022-11-26 – 2022-11-29 (×4): 325 mg via ORAL
  Filled 2022-11-25 (×4): qty 1

## 2022-11-25 MED ORDER — SODIUM CHLORIDE 0.9% IV SOLUTION: Freq: Once | INTRAVENOUS | Status: AC

## 2022-11-25 MED ORDER — INSULIN NPH (HUMAN) (ISOPHANE) 100 UNIT/ML ~~LOC~~ SUSP
25.0000 [IU] | Freq: Two times a day (BID) | SUBCUTANEOUS | Status: DC
  Administered 2022-11-26: 25 [IU] via SUBCUTANEOUS
  Filled 2022-11-25: qty 10

## 2022-11-25 MED ORDER — ASPIRIN 81 MG PO CHEW
81.0000 mg | CHEWABLE_TABLET | Freq: Every day | ORAL | Status: DC
  Administered 2022-11-26 – 2022-11-29 (×4): 81 mg via ORAL
  Filled 2022-11-25 (×4): qty 1

## 2022-11-25 MED ORDER — ACETAMINOPHEN 500 MG PO TABS: 1000.0000 mg | ORAL_TABLET | Freq: Four times a day (QID) | ORAL | Status: DC | PRN

## 2022-11-25 MED ORDER — INSULIN ASPART 100 UNIT/ML IJ SOLN: 0.0000 [IU] | Freq: Three times a day (TID) | INTRAMUSCULAR | Status: DC

## 2022-11-25 MED ORDER — ATORVASTATIN CALCIUM 80 MG PO TABS
80.0000 mg | ORAL_TABLET | Freq: Every day | ORAL | Status: DC
  Administered 2022-11-26 – 2022-12-03 (×9): 80 mg via ORAL
  Filled 2022-11-25 (×11): qty 1

## 2022-11-25 MED ORDER — ASPIRIN 81 MG PO CHEW
324.0000 mg | CHEWABLE_TABLET | Freq: Once | ORAL | Status: AC
  Administered 2022-11-25: 324 mg via ORAL
  Filled 2022-11-25: qty 4

## 2022-11-25 MED ORDER — LISINOPRIL-HYDROCHLOROTHIAZIDE 20-12.5 MG PO TABS: 1.0000 | ORAL_TABLET | Freq: Every day | ORAL | Status: DC

## 2022-11-25 NOTE — Patient Instructions (Signed)
Given your chest pain with radiation into neck, back, and shoulders along with tachycardia (132), and elevated blood pressure reading of 200/90 will have patient proceed to nearest emergency department for further evaluation.  Murmur also appreciated on exam, will need repeat echo.

## 2022-11-25 NOTE — Progress Notes (Signed)
Established Patient Office Visit   Subjective  Patient ID: Catherine Moon, female    DOB: 1960-10-29  Age: 62 y.o. MRN: [id number]  Chief Complaint  Patient presents with   Back Pain    Patient came in today for neck and back pain shoulder, patient came in and could catch her breath, patient is clammy, yelling with the pain      Patient is a 62 year old female followed by Dr. Swaziland and seen for acute concern.  Patient endorses intermittent sharp chest pain that goes up into her neck, shoulders, and back x 4 days.  Started Sunday. Denies change in activity, heavy lifting, pushing, pulling.  SOB during episodes of pain.  Taking bp medications regularly.  Working night shift, just took medication 1 hour prior to appointment.  While being roomed patient tachycardic 130s, yelling out in hallway due to chest pain.  Of note patient mentions heavy postmenopausal bleeding with large clots.  Seen by OB/GYN.  Started on Megace Monday.  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, with long-term current use of insulin (HCC) 02/10/2019   Hand pain, right 03/12/2014   Routine general medical examination at a health care facility 03/14/2013   Pain of left heel 11/23/2011   Menopausal state 09/23/2011   CHEST PAIN 12/18/2009   TOBACCO USE, QUIT 12/18/2009   Hyperlipidemia associated with type 2 diabetes mellitus (HCC) 06/25/2008   Allergic rhinitis 04/04/2007   Essential hypertension 08/20/2006   Past Medical History:  Diagnosis Date   CTS (carpal tunnel syndrome)    DM (diabetes mellitus) (HCC)    Dyslipidemia    Heart murmur    had ECHO 01-2021   HTN (hypertension)    Hypercholesterolemia    Seasonal allergies    Smoker    Warts, genital    Past Surgical History:   Procedure Laterality Date   COLONOSCOPY     CYST EXCISION Right 03/26/2021   Procedure: RIGHT AXILLARY SEBACEOUS CYST EXCISION;  Surgeon: Emelia Loron, MD;  Location: [facility];  Service: General;  Laterality: Right;  LOCAL   DILATION AND CURETTAGE OF UTERUS     WISDOM TOOTH EXTRACTION     Social History   Tobacco Use   Smoking status: Former    Current packs/day: 0.00    Average packs/day: 1 pack/day for 26.0 years (26.0 ttl pk-yrs)    Types: Cigarettes    Start date: 08/12/1981    Quit date: 08/13/2007    Years since quitting: 15.2   Smokeless tobacco: Never   Tobacco comments:     She smoked one pack per day for 26 years but quit in 2009.  Substance Use Topics   Alcohol use: Yes    Comment: social   Drug use: No   Family History  Problem Relation Age of Onset   Breast cancer Mother    Heart disease Mother    Cancer Mother        Breast   Coronary artery disease Father        starting in his 23s. A bypass and mitral infarction   Heart disease Father        Coronary Disease, bypass and mitral infarction   Colon cancer Paternal Grandmother    No Known Allergies    Review of  Systems  Musculoskeletal:  Positive for back pain.   Negative unless stated above    Objective:     BP (!) 200/90 (BP Location: Left Arm, Patient Position: Sitting, Cuff Size: Large)   Pulse (!) 132   Temp 99.2 F (37.3 C) (Oral)   Ht 5\' 6"  (1.676 m)   SpO2 99%   BMI 40.67 kg/m  BP Readings from Last 3 Encounters:  11/25/22 (!) 200/90  09/14/22 138/70  05/04/22 (!) 140/58   Wt Readings from Last 3 Encounters:  09/14/22 252 lb (114.3 kg)  05/04/22 251 lb 12.8 oz (114.2 kg)  12/18/21 242 lb 6.4 oz (110 kg)      Physical Exam Constitutional:      Appearance: Normal appearance. She is obese.  HENT:     Head: Normocephalic and atraumatic.     Mouth/Throat:     Mouth: Mucous membranes are moist.  Eyes:     Extraocular Movements: Extraocular movements intact.      Conjunctiva/sclera: Conjunctivae normal.  Cardiovascular:     Rate and Rhythm: Tachycardia present.     Heart sounds: Murmur heard.     Systolic murmur is present with a grade of 4/6.  Chest:     Comments: No TTP of chest wall. Musculoskeletal:     Cervical back: Normal.     Thoracic back: Normal.     Lumbar back: Normal.     Comments: No TTP of bilateral shoulders, cervical, thoracic, lumbar spine midline.  Neurological:     Mental Status: She is alert.     No results found for any visits on 11/25/22.    Assessment & Plan:  Hypertensive emergency -     EKG 12-Lead  Heart murmur  Chest pain, unspecified type  Postmenopausal bleeding  Patient with acute onset intermittent chest pain with radiation into neck and back.  EKG this visit Echo done 01/06/2021 with EF 60-65%, grade 1 diastolic dysfunction, severely dilated LA.  Moderately calcified aortic valve with moderate thickening.  EKG done this visit with sinus tachycardia, VR 1 teens.  Wide P wave in lead II possibly from LA normality.  No T wave inversion or ST elevation noted.  Prior EKGs from 02/23/2021 reviewed for comparison.  Given patient's current symptoms discussed proceeding to the emergency department for further evaluation.  Anemia may be contributing to symptoms.  Return if symptoms worsen or fail to improve.   Deeann Saint, MD

## 2022-11-25 NOTE — ED Provider Notes (Signed)
[place] EMERGENCY DEPARTMENT AT [facility] Provider Note   CSN: [id number] Arrival date & time: 11/25/22  1451     History  Chief Complaint  Patient presents with   Chest Pain    Catherine Moon is a 62 y.o. female.   Chest Pain Patient is a 62 year old female with a past medical history of HTN, HLD, diabetes, who presented to the ED for chest pain at the recommendation of her PCP.  Patient reports over the past several days, she has had intermittent episodes of exertional chest pain that became worse today.  Reports around 1 PM she started having sharp left-sided chest pain with radiation into her neck and shoulders that worsen when she moves around and resolves when she stops moving.  Denies diaphoresis or vomiting.  Denies fevers, cough, congestion, pleuritic component to her pain.  Otherwise denies abdominal pain, diarrhea, constipation.  Denies a history of MI or having pain like this in the past.  Denies history of PE, DVT or taking blood thinners.     Home Medications Prior to Admission medications   Medication Sig Start Date End Date Taking? Authorizing Provider  acetaminophen (TYLENOL) 500 MG tablet Take 1,000 mg by mouth every 6 (six) hours as needed for mild pain, moderate pain, fever or headache.   Yes [provider]  aspirin 81 MG EC tablet Take 81 mg by mouth daily.   Yes [provider]  atorvastatin (LIPITOR) 80 MG tablet Take 1 tablet (80 mg total) by mouth daily. Due for physical. 10/01/22  Yes Swaziland, Betty G, MD  calcium carbonate (OSCAL) 1500 (600 Ca) MG TABS tablet Take 1,200 mg of elemental calcium by mouth daily with breakfast.   Yes [provider]  ezetimibe (ZETIA) 10 MG tablet Take 1 tablet (10 mg total) by mouth daily. 09/20/22  Yes Carlus Pavlov, MD  ferrous sulfate 325 (65 FE) MG tablet Take 325 mg by mouth daily with breakfast.   Yes [provider]  Fexofenadine HCl (ALLEGRA PO) Take 120 mg  by mouth daily.   Yes [provider]  insulin regular human CONCENTRATED (HUMULIN R U-500 KWIKPEN) 500 UNIT/ML KwikPen Inject 80-100 Units into the skin 3 (three) times daily before meals. Patient taking differently: Inject 100 Units into the skin 3 (three) times daily before meals. 09/14/22  Yes Carlus Pavlov, MD  lisinopril-hydrochlorothiazide (ZESTORETIC) 20-12.5 MG tablet TAKE 1 TABLET BY MOUTH EVERY DAY 05/24/22  Yes [place], Betty G, MD  Loratadine (CLARITIN PO) Take 10 mg by mouth daily.   Yes [provider]  magnesium oxide (MAG-OX) 400 MG tablet Take 400 mg by mouth daily.   Yes [provider]  megestrol (MEGACE) 40 MG tablet Take 40 mg by mouth daily. TAKE ONE TABLET BY MOUTH TWO TIMES A DAY FOR 3 DAYS AND THEN 1 TABLET DAILY FOR 15 DAYS 11/22/22  Yes [provider]  Multiple Vitamin (MULTIVITAMIN WITH MINERALS) TABS tablet Take 1 tablet by mouth daily.   Yes [provider]  Potassium 99 MG TABS Take 1 tablet by mouth daily.   Yes [provider]  tirzepatide Greggory Keen) 10 MG/0.5ML Pen Inject 10 mg into the skin once a week. Patient taking differently: Inject 10 mg into the skin once a week. Thursday 09/14/22  Yes Carlus Pavlov, MD  zinc gluconate 50 MG tablet Take 50 mg by mouth daily.   Yes [provider]  Continuous Glucose Sensor (FREESTYLE LIBRE 3 SENSOR) MISC Place 1 sensor  on the skin every 14 days. Use to check glucose continuously 09/14/22   Carlus Pavlov, MD  Insulin Pen Needle 32G X 4 MM MISC Use 4x a day 05/18/21   Carlus Pavlov, MD      Allergies    Patient has no known allergies.    Review of Systems   Review of Systems  Cardiovascular:  Positive for chest pain.    Physical Exam Updated Vital Signs BP (!) 133/51   Pulse (!) 103   Temp 98.7 F (37.1 C) (Oral)   Resp (!) 24   Ht 5\' 6"  (1.676 m)   Wt 113.4 kg   SpO2 99%   BMI 40.35 kg/m  Physical Exam Constitutional:      General:  She is not in acute distress.    Appearance: She is not ill-appearing.  HENT:     Head: Normocephalic and atraumatic.  Eyes:     Extraocular Movements: Extraocular movements intact.     Pupils: Pupils are equal, round, and reactive to light.  Cardiovascular:     Rate and Rhythm: Regular rhythm. Tachycardia present.     Heart sounds: Murmur heard.     Systolic murmur is present.     No friction rub. No gallop.     Comments: Patient reports she has been told she has a murmur in the past Pulmonary:     Effort: Pulmonary effort is normal.     Breath sounds: No wheezing, rhonchi or rales.  Musculoskeletal:        General: Normal range of motion.     Cervical back: Normal range of motion and neck supple.     Right lower leg: Edema present.     Left lower leg: Edema present.  Skin:    General: Skin is warm and dry.     Capillary Refill: Capillary refill takes less than 2 seconds.  Neurological:     General: No focal deficit present.     Mental Status: She is alert.     Comments: Bilateral radial pulses 2+, strength symmetric and sensation intact in all 4 extremities     ED Results / Procedures / Treatments   Labs (all labs ordered are listed, but only abnormal results are displayed) Labs Reviewed  BASIC METABOLIC PANEL - Abnormal; Notable for the following components:      Result Value   Glucose, Bld 316 (*)    All other components within normal limits  CBC - Abnormal; Notable for the following components:   WBC 14.3 (*)    RBC 2.47 (*)    Hemoglobin 7.9 (*)    HCT 25.2 (*)    MCV 102.0 (*)    RDW 18.0 (*)    Platelets 439 (*)    nRBC 0.4 (*)    All other components within normal limits  TROPONIN I (HIGH SENSITIVITY) - Abnormal; Notable for the following components:   Troponin I (High Sensitivity) 138 (*)    All other components within normal limits  TROPONIN I (HIGH SENSITIVITY) - Abnormal; Notable for the following components:   Troponin I (High Sensitivity) 140 (*)     All other components within normal limits    EKG EKG Interpretation Date/Time:  Thursday November 25 2022 14:57:00 EST Ventricular Rate:  119 PR Interval:  126 QRS Duration:  86 QT Interval:  352 QTC Calculation: 495 R Axis:   19  Text Interpretation: Sinus tachycardia Nonspecific ST abnormality Abnormal ECG When compared with ECG of 16-Jul-2014 12:33, PREVIOUS  ECG IS PRESENT Confirmed by Beckey Downing [phone]) on 11/25/2022 5:37:29 PM  Radiology CT Angio Chest PE W and/or Wo Contrast  Result Date: 11/25/2022 CLINICAL DATA:  Chest pain.  Concern for pulmonary edema. EXAM: CT ANGIOGRAPHY CHEST WITH CONTRAST TECHNIQUE: Multidetector CT imaging of the chest was performed using the standard protocol during bolus administration of intravenous contrast. Multiplanar CT image reconstructions and MIPs were obtained to evaluate the vascular anatomy. RADIATION DOSE REDUCTION: This exam was performed according to the departmental dose-optimization program which includes automated exposure control, adjustment of the mA and/or kV according to patient size and/or use of iterative reconstruction technique. CONTRAST:  90mL OMNIPAQUE IOHEXOL 300 MG/ML  SOLN COMPARISON:  Chest radiograph dated 11/25/2022. CT dated 12/17/2021. FINDINGS: Evaluation of this exam is limited due to respiratory motion. Cardiovascular: There is no cardiomegaly or pericardial effusion. Two vessel coronary vascular calcification. Mild atherosclerotic calcification of the thoracic aorta. No aneurysmal dilatation or dissection. Evaluation of the pulmonary arteries is limited due to respiratory motion. No pulmonary artery embolus identified. Mediastinum/Nodes: No hilar or mediastinal adenopathy. The esophagus and the thyroid gland are grossly unremarkable. No mediastinal fluid collection. Lungs/Pleura: The lungs are clear. There is no pleural effusion or pneumothorax. The central airways are patent. Upper Abdomen: No acute abnormality.  Musculoskeletal: No acute osseous pathology. Review of the MIP images confirms the above findings. IMPRESSION: 1. No acute intrathoracic pathology. No CT evidence of pulmonary artery embolus. 2.  Aortic Atherosclerosis (ICD10-I70.0). Electronically Signed   By: Elgie Collard M.D.   On: 11/25/2022 20:28   DG Chest 2 View  Result Date: 11/25/2022 CLINICAL DATA:  Chest pain EXAM: CHEST - 2 VIEW COMPARISON:  X-ray 07/16/2014 FINDINGS: The heart size and mediastinal contours are within normal limits. No consolidation, pneumothorax or effusion. No edema. Normal cardiopericardial silhouette. Degenerative changes of the spine. IMPRESSION: No acute cardiopulmonary disease. Electronically Signed   By: Karen Kays M.D.   On: 11/25/2022 17:13    Procedures Procedures    Medications Ordered in ED Medications  aspirin chewable tablet 324 mg (324 mg Oral Given 11/25/22 1743)  iohexol (OMNIPAQUE) 300 MG/ML solution 90 mL (90 mLs Intravenous Contrast Given 11/25/22 2004)    ED Course/ Medical Decision Making/ A&P                                 Medical Decision Making Amount and/or Complexity of Data Reviewed Labs: ordered. Radiology: ordered.  Risk OTC drugs. Prescription drug management. Decision regarding hospitalization.   BMP with no gross metabolic or electrolyte abnormality.  CBC with hemoglobin 7.9, decreased from 15.1 6 years ago.  Troponin 138, 140 on repeat.  Anemia likely secondary to abnormal uterine bleeding that patient reports has resolved since starting megestrol.  EKG overall reassuring.  Patient administered 324 mg aspirin.  CTA PE was obtained, which showed no evidence of acute pulmonary embolism.  Patient discussed with the cardiology service via phone call, who recommended admission to medicine for cardiac workup and deferring heparin administration as this is likely not representative of a type I NSTEMI.  Patient discussed with hospital service, who agreed to admit this  patient.  Patient and husband were updated on results of laboratory evaluations, imaging, and the plan, and were in agreement.        Final Clinical Impression(s) / ED Diagnoses Final diagnoses:  Precordial pain  Elevated troponin    Rx / DC Orders ED Discharge  Orders     None         Janyth Pupa, MD 11/25/22 2252    Durwin Glaze, MD 11/26/22 [phone]

## 2022-11-25 NOTE — ED Triage Notes (Signed)
Pt c/o chest pain that radiates up both sides of neck and down left arm. Pt has had shortness of breath, denies nausea/vomiting. Pt states pain increases with exertion and decreases once she sits down. Pt is taking megastrol for postmenopausal bleeding. Pt denies injury.

## 2022-11-25 NOTE — ED Notes (Signed)
ED TO INPATIENT HANDOFF REPORT  ED Nurse Name and Phone #: Trinna Post [phone]  S Name/Age/Gender [name] 62 y.o. female Room/Bed: 018C/018C  Code Status   Code Status: Not on file  Home/SNF/Other Home Patient oriented to: self, place, time, and situation Is this baseline? Yes   Triage Complete: Triage complete  Chief Complaint NSTEMI (non-ST elevated myocardial infarction) [facility]) [I21.4]  Triage Note Pt c/o chest pain that radiates up both sides of neck and down left arm. Pt has had shortness of breath, denies nausea/vomiting. Pt states pain increases with exertion and decreases once she sits down. Pt is taking megastrol for postmenopausal bleeding. Pt denies injury.    Allergies No Known Allergies  Level of Care/Admitting Diagnosis ED Disposition     ED Disposition  Admit   Condition  --   Comment  Hospital Area: [facility] [[id number]]  Level of Care: Telemetry Cardiac [[id number]]  May admit patient to [facility] or [facility] if equivalent level of care is available:: No  Covid Evaluation: Asymptomatic - no recent exposure (last 10 days) testing not required  Diagnosis: NSTEMI (non-ST elevated myocardial infarction) [facility]) [660630]  Admitting Physician: Dolly Rias [[id number]]  Attending Physician: Dolly Rias [[id number]]  Certification:: I certify this patient will need inpatient services for at least 2 midnights  Expected Medical Readiness: 11/27/2022          B Medical/Surgery History Past Medical History:  Diagnosis Date   CTS (carpal tunnel syndrome)    DM (diabetes mellitus) (HCC)    Dyslipidemia    Heart murmur    had ECHO 01-2021   HTN (hypertension)    Hypercholesterolemia    Seasonal allergies    Smoker    Warts, genital    Past Surgical History:  Procedure Laterality Date   COLONOSCOPY     CYST EXCISION Right 03/26/2021   Procedure: RIGHT AXILLARY SEBACEOUS CYST EXCISION;  Surgeon: Emelia Loron, MD;   Location: [facility];  Service: General;  Laterality: Right;  LOCAL   DILATION AND CURETTAGE OF UTERUS     WISDOM TOOTH EXTRACTION       A IV Location/Drains/Wounds Patient Lines/Drains/Airways Status     Active Line/Drains/Airways     Name Placement date Placement time Site Days   Peripheral IV 11/25/22 20 G Anterior;Left;Proximal Forearm 11/25/22  1928  Forearm  less than 1   Incision (Closed) 03/26/21 Axilla Left 03/26/21  1141  -- 609            Intake/Output Last 24 hours No intake or output data in the 24 hours ending 11/25/22 2213  Labs/Imaging Results for orders placed or performed during the hospital encounter of 11/25/22 (from the past 48 hour(s))  Basic metabolic panel     Status: Abnormal   Collection Time: 11/25/22  3:33 PM  Result Value Ref Range   Sodium 135 135 - 145 mmol/L   Potassium 3.9 3.5 - 5.1 mmol/L   Chloride 103 98 - 111 mmol/L   CO2 23 22 - 32 mmol/L   Glucose, Bld 316 (H) 70 - 99 mg/dL    Comment: Glucose reference range applies only to samples taken after fasting for at least 8 hours.   BUN 10 8 - 23 mg/dL   Creatinine, Ser 2.02 0.44 - 1.00 mg/dL   Calcium 9.5 8.9 - 54.2 mg/dL   GFR, Estimated >70 >62 mL/min    Comment: (NOTE) Calculated using the CKD-EPI Creatinine Equation (2021)  Anion gap 9 5 - 15    Comment: Performed at [facility], [address], [place], [place] [address]  CBC     Status: Abnormal   Collection Time: 11/25/22  3:33 PM  Result Value Ref Range   WBC 14.3 (H) 4.0 - 10.5 K/uL   RBC 2.47 (L) 3.87 - 5.11 MIL/uL   Hemoglobin 7.9 (L) 12.0 - 15.0 g/dL   HCT 56.2 (L) 13.0 - 86.5 %   MCV 102.0 (H) 80.0 - 100.0 fL   MCH 32.0 26.0 - 34.0 pg   MCHC 31.3 30.0 - 36.0 g/dL   RDW 78.4 (H) 69.6 - 29.5 %   Platelets 439 (H) 150 - 400 K/uL   nRBC 0.4 (H) 0.0 - 0.2 %    Comment: Performed at [facility], [address] [place], [place] [address]  Troponin I (High Sensitivity)     Status: Abnormal    Collection Time: 11/25/22  3:33 PM  Result Value Ref Range   Troponin I (High Sensitivity) 138 (HH) <18 ng/L    Comment: CRITICAL RESULT CALLED TO, READ BACK BY AND VERIFIED WITH P.PULLIAM,RN @1658  11/25/2022 VANG.J (NOTE) Elevated high sensitivity troponin I (hsTnI) values and significant  changes across serial measurements may suggest ACS but many other  chronic and acute conditions are known to elevate hsTnI results.  Refer to the "Links" section for chest pain algorithms and additional  guidance. Performed at [facility], [address], [place], [place] [address]   Troponin I (High Sensitivity)     Status: Abnormal   Collection Time: 11/25/22  5:30 PM  Result Value Ref Range   Troponin I (High Sensitivity) 140 (HH) <18 ng/L    Comment: CRITICAL VALUE NOTED.  VALUE IS CONSISTENT WITH PREVIOUSLY REPORTED AND CALLED VALUE. (NOTE) Elevated high sensitivity troponin I (hsTnI) values and significant  changes across serial measurements may suggest ACS but many other  chronic and acute conditions are known to elevate hsTnI results.  Refer to the "Links" section for chest pain algorithms and additional  guidance. Performed at [facility], [address], [place], [place] [address]    CT Angio Chest PE W and/or Wo Contrast  Result Date: 11/25/2022 CLINICAL DATA:  Chest pain.  Concern for pulmonary edema. EXAM: CT ANGIOGRAPHY CHEST WITH CONTRAST TECHNIQUE: Multidetector CT imaging of the chest was performed using the standard protocol during bolus administration of intravenous contrast. Multiplanar CT image reconstructions and MIPs were obtained to evaluate the vascular anatomy. RADIATION DOSE REDUCTION: This exam was performed according to the departmental dose-optimization program which includes automated exposure control, adjustment of the mA and/or kV according to patient size and/or use of iterative reconstruction technique. CONTRAST:  90mL OMNIPAQUE IOHEXOL 300 MG/ML   SOLN COMPARISON:  Chest radiograph dated 11/25/2022. CT dated 12/17/2021. FINDINGS: Evaluation of this exam is limited due to respiratory motion. Cardiovascular: There is no cardiomegaly or pericardial effusion. Two vessel coronary vascular calcification. Mild atherosclerotic calcification of the thoracic aorta. No aneurysmal dilatation or dissection. Evaluation of the pulmonary arteries is limited due to respiratory motion. No pulmonary artery embolus identified. Mediastinum/Nodes: No hilar or mediastinal adenopathy. The esophagus and the thyroid gland are grossly unremarkable. No mediastinal fluid collection. Lungs/Pleura: The lungs are clear. There is no pleural effusion or pneumothorax. The central airways are patent. Upper Abdomen: No acute abnormality. Musculoskeletal: No acute osseous pathology. Review of the MIP images confirms the above findings. IMPRESSION: 1. No acute intrathoracic  pathology. No CT evidence of pulmonary artery embolus. 2.  Aortic Atherosclerosis (ICD10-I70.0). Electronically Signed   By: Elgie Collard M.D.   On: 11/25/2022 20:28   DG Chest 2 View  Result Date: 11/25/2022 CLINICAL DATA:  Chest pain EXAM: CHEST - 2 VIEW COMPARISON:  X-ray 07/16/2014 FINDINGS: The heart size and mediastinal contours are within normal limits. No consolidation, pneumothorax or effusion. No edema. Normal cardiopericardial silhouette. Degenerative changes of the spine. IMPRESSION: No acute cardiopulmonary disease. Electronically Signed   By: Karen Kays M.D.   On: 11/25/2022 17:13    Pending Labs Unresulted Labs (From admission, onward)    None       Vitals/Pain Today's Vitals   11/25/22 1650 11/25/22 1729 11/25/22 2045 11/25/22 2053  BP: (!) 149/60  (!) 138/53   Pulse: (!) 113  (!) 103   Resp: 20  (!) 24   Temp: 98.5 F (36.9 C)   98.7 F (37.1 C)  TempSrc: Oral   Oral  SpO2: 97%  98%   Weight:      Height:      PainSc:  0-No pain      Isolation Precautions No active  isolations  Medications Medications  aspirin chewable tablet 324 mg (324 mg Oral Given 11/25/22 1743)  iohexol (OMNIPAQUE) 300 MG/ML solution 90 mL (90 mLs Intravenous Contrast Given 11/25/22 2004)    Mobility walks     Focused Assessments     R Recommendations: See Admitting Provider Note  Report given to:   Additional Notes:

## 2022-11-25 NOTE — ED Notes (Signed)
Awaiting patient from lobby 

## 2022-11-26 ENCOUNTER — Encounter (HOSPITAL_COMMUNITY)
Admission: EM | Disposition: A | Discharge status: Home / Self Care | Payer: Self-pay | Source: Ambulatory Visit | Attending: Thoracic Surgery (Cardiothoracic Vascular Surgery)

## 2022-11-26 ENCOUNTER — Inpatient Hospital Stay (HOSPITAL_COMMUNITY): Payer: BC Managed Care – PPO

## 2022-11-26 DIAGNOSIS — I2510 Atherosclerotic heart disease of native coronary artery without angina pectoris: Secondary | ICD-10-CM

## 2022-11-26 DIAGNOSIS — I350 Nonrheumatic aortic (valve) stenosis: Secondary | ICD-10-CM | POA: Diagnosis not present

## 2022-11-26 DIAGNOSIS — I214 Non-ST elevation (NSTEMI) myocardial infarction: Secondary | ICD-10-CM | POA: Diagnosis not present

## 2022-11-26 DIAGNOSIS — I200 Unstable angina: Secondary | ICD-10-CM

## 2022-11-26 DIAGNOSIS — R7989 Other specified abnormal findings of blood chemistry: Secondary | ICD-10-CM | POA: Diagnosis not present

## 2022-11-26 DIAGNOSIS — I25110 Atherosclerotic heart disease of native coronary artery with unstable angina pectoris: Secondary | ICD-10-CM | POA: Diagnosis not present

## 2022-11-26 DIAGNOSIS — I10 Essential (primary) hypertension: Secondary | ICD-10-CM | POA: Diagnosis not present

## 2022-11-26 HISTORY — PX: LEFT HEART CATH AND CORONARY ANGIOGRAPHY: CATH118249

## 2022-11-26 LAB — FERRITIN: Ferritin: 33 ng/mL (ref 11–307)

## 2022-11-26 LAB — VITAMIN B12: Vitamin B-12: 267 pg/mL (ref 180–914)

## 2022-11-26 LAB — ECHOCARDIOGRAM COMPLETE
AR max vel: 2.02 cm2
AV Area VTI: 2.15 cm2
AV Area mean vel: 2.02 cm2
AV Mean grad: 8 mm[Hg]
AV Peak grad: 14 mm[Hg]
Ao pk vel: 1.87 m/s
Area-P 1/2: 3.74 cm2
Height: 66 in
S' Lateral: 2.9 cm
Weight: 3837.77 [oz_av]

## 2022-11-26 LAB — TROPONIN I (HIGH SENSITIVITY)
Troponin I (High Sensitivity): 279 ng/L (ref <18)
Troponin I (High Sensitivity): 233 ng/L (ref <18)

## 2022-11-26 LAB — GLUCOSE, CAPILLARY
Glucose-Capillary: 258 mg/dL — ABNORMAL HIGH (ref 70–99)
Glucose-Capillary: 228 mg/dL — ABNORMAL HIGH (ref 70–99)
Glucose-Capillary: 257 mg/dL — ABNORMAL HIGH (ref 70–99)
Glucose-Capillary: 208 mg/dL — ABNORMAL HIGH (ref 70–99)
Glucose-Capillary: 301 mg/dL — ABNORMAL HIGH (ref 70–99)

## 2022-11-26 LAB — LIPID PANEL
Cholesterol: 102 mg/dL (ref 0–200)
HDL: 36 mg/dL — ABNORMAL LOW (ref >40)
LDL Cholesterol: 42 mg/dL (ref 0–99)
Total CHOL/HDL Ratio: 2.8 {ratio}
Triglycerides: 122 mg/dL (ref <150)
VLDL: 24 mg/dL (ref 0–40)

## 2022-11-26 LAB — BASIC METABOLIC PANEL
Anion gap: 10 (ref 5–15)
BUN: 9 mg/dL (ref 8–23)
CO2: 22 mmol/L (ref 22–32)
Calcium: 9.1 mg/dL (ref 8.9–10.3)
Chloride: 103 mmol/L (ref 98–111)
Creatinine, Ser: 0.67 mg/dL (ref 0.44–1.00)
GFR, Estimated: >60 mL/min (ref >60)
Glucose, Bld: 281 mg/dL — ABNORMAL HIGH (ref 70–99)
Potassium: 3.9 mmol/L (ref 3.5–5.1)
Sodium: 135 mmol/L (ref 135–145)

## 2022-11-26 LAB — PREPARE RBC (CROSSMATCH)

## 2022-11-26 LAB — CBC
HCT: 26.9 % — ABNORMAL LOW (ref 36.0–46.0)
Hemoglobin: 8.5 g/dL — ABNORMAL LOW (ref 12.0–15.0)
MCH: 31.4 pg (ref 26.0–34.0)
MCHC: 31.6 g/dL (ref 30.0–36.0)
MCV: 99.3 fL (ref 80.0–100.0)
Platelets: 417 10*3/uL — ABNORMAL HIGH (ref 150–400)
RBC: 2.71 MIL/uL — ABNORMAL LOW (ref 3.87–5.11)
RDW: 18.2 % — ABNORMAL HIGH (ref 11.5–15.5)
WBC: 12.4 10*3/uL — ABNORMAL HIGH (ref 4.0–10.5)
nRBC: 0.3 % — ABNORMAL HIGH (ref 0.0–0.2)

## 2022-11-26 LAB — IRON AND TIBC
Iron: 60 ug/dL (ref 28–170)
Saturation Ratios: 15 % (ref 10.4–31.8)
TIBC: 414 ug/dL (ref 250–450)
UIBC: 354 ug/dL

## 2022-11-26 LAB — FOLATE: Folate: >40 ng/mL (ref >5.9)

## 2022-11-26 LAB — PHOSPHORUS: Phosphorus: 4.5 mg/dL (ref 2.5–4.6)

## 2022-11-26 LAB — ABO/RH: ABO/RH(D): O POS

## 2022-11-26 LAB — PROTIME-INR
INR: 1 (ref 0.8–1.2)
Prothrombin Time: 13.3 s (ref 11.4–15.2)

## 2022-11-26 LAB — HIV ANTIBODY (ROUTINE TESTING W REFLEX): HIV Screen 4th Generation wRfx: NONREACTIVE

## 2022-11-26 LAB — TRANSFERRIN: Transferrin: 296 mg/dL (ref 192–382)

## 2022-11-26 LAB — HEMOGLOBIN A1C
Hgb A1c MFr Bld: 7.5 % — ABNORMAL HIGH (ref 4.8–5.6)
Mean Plasma Glucose: 168.55 mg/dL

## 2022-11-26 LAB — MAGNESIUM: Magnesium: 1.9 mg/dL (ref 1.7–2.4)

## 2022-11-26 SURGERY — LEFT HEART CATH AND CORONARY ANGIOGRAPHY
Anesthesia: LOCAL

## 2022-11-26 MED ORDER — LIDOCAINE HCL (PF) 1 % IJ SOLN
INTRAMUSCULAR | Status: AC
  Filled 2022-11-26: qty 30

## 2022-11-26 MED ORDER — INSULIN ASPART 100 UNIT/ML IJ SOLN
0.0000 [IU] | Freq: Every day | INTRAMUSCULAR | Status: DC
  Administered 2022-11-26 – 2022-11-27 (×2): 4 [IU] via SUBCUTANEOUS
  Administered 2022-11-28 – 2022-11-29 (×2): 2 [IU] via SUBCUTANEOUS

## 2022-11-26 MED ORDER — VERAPAMIL HCL 2.5 MG/ML IV SOLN
INTRAVENOUS | Status: AC
  Filled 2022-11-26: qty 2

## 2022-11-26 MED ORDER — VERAPAMIL HCL 2.5 MG/ML IV SOLN
INTRAVENOUS | Status: DC | PRN
  Administered 2022-11-26: 10 mL via INTRA_ARTERIAL

## 2022-11-26 MED ORDER — SODIUM CHLORIDE 0.9 % WEIGHT BASED INFUSION: 1.0000 mL/kg/h | INTRAVENOUS | Status: DC

## 2022-11-26 MED ORDER — SODIUM CHLORIDE 0.9% FLUSH
3.0000 mL | Freq: Two times a day (BID) | INTRAVENOUS | Status: DC
  Administered 2022-11-27 – 2022-11-29 (×5): 3 mL via INTRAVENOUS

## 2022-11-26 MED ORDER — HEPARIN SODIUM (PORCINE) 1000 UNIT/ML IJ SOLN
INTRAMUSCULAR | Status: DC | PRN
  Administered 2022-11-26: 5000 [IU] via INTRAVENOUS

## 2022-11-26 MED ORDER — HEPARIN SODIUM (PORCINE) 5000 UNIT/ML IJ SOLN
5000.0000 [IU] | Freq: Three times a day (TID) | INTRAMUSCULAR | Status: DC
  Administered 2022-11-26 – 2022-11-29 (×11): 5000 [IU] via SUBCUTANEOUS
  Filled 2022-11-26 (×12): qty 1

## 2022-11-26 MED ORDER — ONDANSETRON HCL 4 MG/2ML IJ SOLN
4.0000 mg | Freq: Four times a day (QID) | INTRAMUSCULAR | Status: DC | PRN
  Filled 2022-11-26: qty 2

## 2022-11-26 MED ORDER — INSULIN ASPART 100 UNIT/ML IJ SOLN
0.0000 [IU] | Freq: Three times a day (TID) | INTRAMUSCULAR | Status: DC
Start: 2022-11-26 — End: 2022-11-30
  Administered 2022-11-26: 11 [IU] via SUBCUTANEOUS
  Administered 2022-11-26 – 2022-11-27 (×4): 7 [IU] via SUBCUTANEOUS
  Administered 2022-11-27 – 2022-11-28 (×2): 11 [IU] via SUBCUTANEOUS
  Administered 2022-11-28: 7 [IU] via SUBCUTANEOUS
  Administered 2022-11-28: 4 [IU] via SUBCUTANEOUS
  Administered 2022-11-29: 7 [IU] via SUBCUTANEOUS
  Administered 2022-11-29 (×2): 4 [IU] via SUBCUTANEOUS

## 2022-11-26 MED ORDER — SODIUM CHLORIDE 0.9 % WEIGHT BASED INFUSION
3.0000 mL/kg/h | INTRAVENOUS | Status: DC
  Administered 2022-11-26: 3 mL/kg/h via INTRAVENOUS

## 2022-11-26 MED ORDER — INSULIN ASPART 100 UNIT/ML IJ SOLN
6.0000 [IU] | Freq: Three times a day (TID) | INTRAMUSCULAR | Status: DC
  Administered 2022-11-26 – 2022-11-27 (×3): 6 [IU] via SUBCUTANEOUS

## 2022-11-26 MED ORDER — HEPARIN (PORCINE) IN NACL 1000-0.9 UT/500ML-% IV SOLN
INTRAVENOUS | Status: DC | PRN
  Administered 2022-11-26 (×2): 500 mL

## 2022-11-26 MED ORDER — SODIUM CHLORIDE 0.9 % IV SOLN: 250.0000 mL | INTRAVENOUS | Status: AC | PRN

## 2022-11-26 MED ORDER — LIDOCAINE HCL (PF) 1 % IJ SOLN
INTRAMUSCULAR | Status: DC | PRN
  Administered 2022-11-26: 2 mL via INTRADERMAL

## 2022-11-26 MED ORDER — CYANOCOBALAMIN 1000 MCG/ML IJ SOLN
1000.0000 ug | Freq: Every day | INTRAMUSCULAR | Status: DC
  Administered 2022-11-26 – 2022-11-29 (×4): 1000 ug via INTRAMUSCULAR
  Filled 2022-11-26 (×5): qty 1

## 2022-11-26 MED ORDER — INSULIN DETEMIR 100 UNIT/ML ~~LOC~~ SOLN
50.0000 [IU] | Freq: Two times a day (BID) | SUBCUTANEOUS | Status: DC
  Administered 2022-11-26 – 2022-11-29 (×8): 50 [IU] via SUBCUTANEOUS
  Filled 2022-11-26 (×11): qty 0.5

## 2022-11-26 MED ORDER — FENTANYL CITRATE (PF) 100 MCG/2ML IJ SOLN
INTRAMUSCULAR | Status: AC
  Filled 2022-11-26: qty 2

## 2022-11-26 MED ORDER — MIDAZOLAM HCL 2 MG/2ML IJ SOLN
INTRAMUSCULAR | Status: AC
  Filled 2022-11-26: qty 2

## 2022-11-26 MED ORDER — HEPARIN SODIUM (PORCINE) 1000 UNIT/ML IJ SOLN
INTRAMUSCULAR | Status: AC
  Filled 2022-11-26: qty 10

## 2022-11-26 MED ORDER — METOPROLOL TARTRATE 25 MG PO TABS
25.0000 mg | ORAL_TABLET | Freq: Two times a day (BID) | ORAL | Status: DC
  Administered 2022-11-26 – 2022-11-29 (×8): 25 mg via ORAL
  Filled 2022-11-26 (×8): qty 1

## 2022-11-26 MED ORDER — IOHEXOL 350 MG/ML SOLN
INTRAVENOUS | Status: DC | PRN
  Administered 2022-11-26: 60 mL

## 2022-11-26 MED ORDER — INSULIN DETEMIR 100 UNIT/ML ~~LOC~~ SOLN
30.0000 [IU] | Freq: Two times a day (BID) | SUBCUTANEOUS | Status: DC
  Filled 2022-11-26 (×2): qty 0.3

## 2022-11-26 MED ORDER — SODIUM CHLORIDE 0.9% FLUSH: 3.0000 mL | INTRAVENOUS | Status: DC | PRN

## 2022-11-26 MED ORDER — MIDAZOLAM HCL 2 MG/2ML IJ SOLN
INTRAMUSCULAR | Status: DC | PRN
  Administered 2022-11-26: 1 mg via INTRAVENOUS

## 2022-11-26 MED ORDER — FENTANYL CITRATE (PF) 100 MCG/2ML IJ SOLN
INTRAMUSCULAR | Status: DC | PRN
  Administered 2022-11-26: 50 ug via INTRAVENOUS

## 2022-11-26 SURGICAL SUPPLY — 7 items
CATH INFINITI AMBI 5FR JK (CATHETERS) IMPLANT
DEVICE RAD COMP TR BAND LRG (VASCULAR PRODUCTS) IMPLANT
GLIDESHEATH SLEND SS 6F .021 (SHEATH) IMPLANT
GUIDEWIRE INQWIRE 1.5J.035X260 (WIRE) IMPLANT
INQWIRE 1.5J .035X260CM (WIRE) ×1
PACK CARDIAC CATHETERIZATION (CUSTOM PROCEDURE TRAY) ×2 IMPLANT
SET ATX-X65L (MISCELLANEOUS) IMPLANT

## 2022-11-26 NOTE — Consult Note (Signed)
Reason for Consult:left main disease Referring Physician: Dr. [name] is an 62 y.o. female.  HPI: 62 yo woman with multiple cardiac risk factors including insulin dependent DM, hyperlipidemia, hypertension, hx of smoking and obesity.  Know mild AS.  Presented with 6 day history of stuttering CP.  Finally did not resolve and became severe 10/10.  R/I for non STEMI.  She underwent cardiac cath which revealed a 70% left main stenosis in a left dominant circulation.  EF 50-55%.  LVEDP elevated at 33 mmHg  Echo done 11/22- reading pending Past Medical History:  Diagnosis Date   CTS (carpal tunnel syndrome)    DM (diabetes mellitus) (HCC)    Dyslipidemia    Heart murmur    had ECHO 01-2021   HTN (hypertension)    Hypercholesterolemia    Seasonal allergies    Smoker    Warts, genital     Past Surgical History:  Procedure Laterality Date   COLONOSCOPY     CYST EXCISION Right 03/26/2021   Procedure: RIGHT AXILLARY SEBACEOUS CYST EXCISION;  Surgeon: Emelia Loron, MD;  Location: [facility];  Service: General;  Laterality: Right;  LOCAL   DILATION AND CURETTAGE OF UTERUS     WISDOM TOOTH EXTRACTION      Family History  Problem Relation Age of Onset   Breast cancer Mother    Heart disease Mother    Cancer Mother        Breast   Coronary artery disease Father        starting in his 87s. A bypass and mitral infarction   Heart disease Father        Coronary Disease, bypass and mitral infarction   Colon cancer Paternal Grandmother     Social History:  reports that she quit smoking about 15 years ago. Her smoking use included cigarettes. She started smoking about 41 years ago. She has a 26 pack-year smoking history. She has never used smokeless tobacco. She reports current alcohol use. She reports that she does not use drugs.  Allergies: No Known Allergies  Medications: Scheduled:  aspirin  81 mg Oral Daily   atorvastatin  80 mg Oral Daily    cyanocobalamin  1,000 mcg Intramuscular Daily   ezetimibe  10 mg Oral Daily   ferrous sulfate  325 mg Oral Q breakfast   heparin injection (subcutaneous)  5,000 Units Subcutaneous Q8H   lisinopril  20 mg Oral Daily   And   hydrochlorothiazide  12.5 mg Oral Daily   insulin aspart  0-20 Units Subcutaneous TID WC   insulin aspart  0-5 Units Subcutaneous QHS   insulin aspart  6 Units Subcutaneous TID WC   insulin detemir  50 Units Subcutaneous BID   loratadine  10 mg Oral Daily   metoprolol tartrate  25 mg Oral BID   sodium chloride flush  3 mL Intravenous Q12H   sodium chloride flush  3 mL Intravenous Q12H    Results for orders placed or performed during the hospital encounter of 11/25/22 (from the past 48 hour(s))  Basic metabolic panel     Status: Abnormal   Collection Time: 11/25/22  3:33 PM  Result Value Ref Range   Sodium 135 135 - 145 mmol/L   Potassium 3.9 3.5 - 5.1 mmol/L   Chloride 103 98 - 111 mmol/L   CO2 23 22 - 32 mmol/L   Glucose, Bld 316 (H) 70 - 99 mg/dL    Comment: Glucose reference  range applies only to samples taken after fasting for at least 8 hours.   BUN 10 8 - 23 mg/dL   Creatinine, Ser 0.34 0.44 - 1.00 mg/dL   Calcium 9.5 8.9 - 74.2 mg/dL   GFR, Estimated >59 >56 mL/min    Comment: (NOTE) Calculated using the CKD-EPI Creatinine Equation (2021)    Anion gap 9 5 - 15    Comment: Performed at [facility], [address] [place], [place] [address]  CBC     Status: Abnormal   Collection Time: 11/25/22  3:33 PM  Result Value Ref Range   WBC 14.3 (H) 4.0 - 10.5 K/uL   RBC 2.47 (L) 3.87 - 5.11 MIL/uL   Hemoglobin 7.9 (L) 12.0 - 15.0 g/dL   HCT 43.3 (L) 29.5 - 18.8 %   MCV 102.0 (H) 80.0 - 100.0 fL   MCH 32.0 26.0 - 34.0 pg   MCHC 31.3 30.0 - 36.0 g/dL   RDW 41.6 (H) 60.6 - 30.1 %   Platelets 439 (H) 150 - 400 K/uL   nRBC 0.4 (H) 0.0 - 0.2 %    Comment: Performed at [facility], [address], [place], [place] [address]  Troponin I (High  Sensitivity)     Status: Abnormal   Collection Time: 11/25/22  3:33 PM  Result Value Ref Range   Troponin I (High Sensitivity) 138 (HH) <18 ng/L    Comment: CRITICAL RESULT CALLED TO, READ BACK BY AND VERIFIED WITH P.PULLIAM,RN @1658  11/25/2022 VANG.J (NOTE) Elevated high sensitivity troponin I (hsTnI) values and significant  changes across serial measurements may suggest ACS but many other  chronic and acute conditions are known to elevate hsTnI results.  Refer to the "Links" section for chest pain algorithms and additional  guidance. Performed at [facility], [address], [place], [place] [address]   ABO/Rh     Status: None   Collection Time: 11/25/22  3:33 PM  Result Value Ref Range   ABO/RH(D)      O POS Performed at [facility], [address], [place], [place] [address]   Troponin I (High Sensitivity)     Status: Abnormal   Collection Time: 11/25/22  5:30 PM  Result Value Ref Range   Troponin I (High Sensitivity) 140 (HH) <18 ng/L    Comment: CRITICAL VALUE NOTED.  VALUE IS CONSISTENT WITH PREVIOUSLY REPORTED AND CALLED VALUE. (NOTE) Elevated high sensitivity troponin I (hsTnI) values and significant  changes across serial measurements may suggest ACS but many other  chronic and acute conditions are known to elevate hsTnI results.  Refer to the "Links" section for chest pain algorithms and additional  guidance. Performed at [facility], [address] [place], [place] [address]   Troponin I (High Sensitivity)     Status: Abnormal   Collection Time: 11/25/22 11:22 PM  Result Value Ref Range   Troponin I (High Sensitivity) 279 (HH) <18 ng/L    Comment: CRITICAL VALUE NOTED. VALUE IS CONSISTENT WITH PREVIOUSLY REPORTED/CALLED VALUE (NOTE) Elevated high sensitivity troponin I (hsTnI) values and significant  changes across serial measurements may suggest ACS but many other  chronic and acute conditions are known to elevate hsTnI results.  Refer to  the "Links" section for chest pain algorithms and additional  guidance. Performed at [facility], [address], [place], [place] [address]   Type and screen [facility]     Status: None (  Preliminary result)   Collection Time: 11/25/22 11:22 PM  Result Value Ref Range   ABO/RH(D) O POS    Antibody Screen NEG    Sample Expiration 11/28/2022,2359    Unit Number [id number]    Blood Component Type RBC LR PHER1    Unit division 00    Status of Unit ISSUED    Transfusion Status OK TO TRANSFUSE    Crossmatch Result      Compatible Performed at [facility], [address], [place], [place] [address]   Ferritin     Status: None   Collection Time: 11/25/22 11:22 PM  Result Value Ref Range   Ferritin 33 11 - 307 ng/mL    Comment: Performed at [facility], [address], [place], [place] [address]  Folate     Status: None   Collection Time: 11/25/22 11:22 PM  Result Value Ref Range   Folate >40.0 >5.9 ng/mL    Comment: Performed at [facility], [address] [place], [place] [address]  Iron and TIBC     Status: None   Collection Time: 11/25/22 11:22 PM  Result Value Ref Range   Iron 60 28 - 170 ug/dL   TIBC 254 270 - 623 ug/dL   Saturation Ratios 15 10.4 - 31.8 %   UIBC 354 ug/dL    Comment: Performed at [facility], [address], [place], [place] [address]  Transferrin     Status: None   Collection Time: 11/25/22 11:22 PM  Result Value Ref Range   Transferrin 296 192 - 382 mg/dL    Comment: Performed at [facility], [address] [place], [place] [address]  Prepare RBC (crossmatch)     Status: None   Collection Time: 11/25/22 11:28 PM  Result Value Ref Range   Order Confirmation      ORDER PROCESSED BY BLOOD BANK Performed at [facility], [address], [place], [place] [address]   Vitamin B12     Status: None   Collection Time: 11/25/22 11:28 PM  Result Value Ref Range   Vitamin B-12 267 180 - 914  pg/mL    Comment: (NOTE) This assay is not validated for testing neonatal or myeloproliferative syndrome specimens for Vitamin B12 levels. Performed at [facility], [address] [place], [place] [address]   Glucose, capillary     Status: Abnormal   Collection Time: 11/25/22 11:46 PM  Result Value Ref Range   Glucose-Capillary 192 (H) 70 - 99 mg/dL    Comment: Glucose reference range applies only to samples taken after fasting for at least 8 hours.  HIV Antibody (routine testing w rflx)     Status: None   Collection Time: 11/26/22  6:19 AM  Result Value Ref Range   HIV Screen 4th Generation wRfx Non Reactive Non Reactive    Comment: Performed at [facility], [address] [place], [place] [address]  Basic metabolic panel     Status: Abnormal   Collection Time: 11/26/22  6:19 AM  Result Value Ref Range   Sodium 135 135 - 145 mmol/L   Potassium 3.9 3.5 - 5.1 mmol/L   Chloride 103 98 - 111 mmol/L   CO2 22 22 - 32 mmol/L   Glucose, Bld 281 (H) 70 - 99 mg/dL    Comment: Glucose reference range applies only to samples taken after fasting for at least 8 hours.   BUN  9 8 - 23 mg/dL   Creatinine, Ser 1.61 0.44 - 1.00 mg/dL   Calcium 9.1 8.9 - 09.6 mg/dL   GFR, Estimated >04 >54 mL/min    Comment: (NOTE) Calculated using the CKD-EPI Creatinine Equation (2021)    Anion gap 10 5 - 15    Comment: Performed at [facility], [address] [place], [place] [address]  CBC     Status: Abnormal   Collection Time: 11/26/22  6:19 AM  Result Value Ref Range   WBC 12.4 (H) 4.0 - 10.5 K/uL   RBC 2.71 (L) 3.87 - 5.11 MIL/uL   Hemoglobin 8.5 (L) 12.0 - 15.0 g/dL   HCT 91.4 (L) 78.2 - 95.6 %   MCV 99.3 80.0 - 100.0 fL   MCH 31.4 26.0 - 34.0 pg   MCHC 31.6 30.0 - 36.0 g/dL   RDW 21.3 (H) 08.6 - 57.8 %   Platelets 417 (H) 150 - 400 K/uL   nRBC 0.3 (H) 0.0 - 0.2 %    Comment: Performed at [facility], [address], [place], [place] [address]  Magnesium      Status: None   Collection Time: 11/26/22  6:19 AM  Result Value Ref Range   Magnesium 1.9 1.7 - 2.4 mg/dL    Comment: Performed at [facility], [address], [place], [place] [address]  Phosphorus     Status: None   Collection Time: 11/26/22  6:19 AM  Result Value Ref Range   Phosphorus 4.5 2.5 - 4.6 mg/dL    Comment: Performed at [facility], [address] [place], [place] [address]  Protime-INR     Status: None   Collection Time: 11/26/22  6:19 AM  Result Value Ref Range   Prothrombin Time 13.3 11.4 - 15.2 seconds   INR 1.0 0.8 - 1.2    Comment: (NOTE) INR goal varies based on device and disease states. Performed at [facility], [address] [place], [place] [address]   Hemoglobin A1c     Status: Abnormal   Collection Time: 11/26/22  6:19 AM  Result Value Ref Range   Hgb A1c MFr Bld 7.5 (H) 4.8 - 5.6 %    Comment: (NOTE) Pre diabetes:          5.7%-6.4%  Diabetes:              >6.4%  Glycemic control for   <7.0% adults with diabetes    Mean Plasma Glucose 168.55 mg/dL    Comment: Performed at [facility], [address] [place], [place] [address]  Lipid panel     Status: Abnormal   Collection Time: 11/26/22  6:19 AM  Result Value Ref Range   Cholesterol 102 0 - 200 mg/dL   Triglycerides 440 <347 mg/dL   HDL 36 (L) >42 mg/dL   Total CHOL/HDL Ratio 2.8 RATIO   VLDL 24 0 - 40 mg/dL   LDL Cholesterol 42 0 - 99 mg/dL    Comment:        Total Cholesterol/HDL:CHD Risk Coronary Heart Disease Risk Table                     Men   Women  1/2 Average Risk   3.4   3.3  Average Risk       5.0   4.4  2 X Average Risk   9.6   7.1  3 X Average  Risk  23.4   11.0        Use the calculated Patient Ratio above and the CHD Risk Table to determine the patient's CHD Risk.        ATP III CLASSIFICATION (LDL):  <100     mg/dL   Optimal  161-096  mg/dL   Near or Above                    Optimal  130-159  mg/dL   Borderline  045-409  mg/dL    High  >811     mg/dL   Very High Performed at [facility], [address] [place], [place] [address]   Troponin I (High Sensitivity)     Status: Abnormal   Collection Time: 11/26/22  6:19 AM  Result Value Ref Range   Troponin I (High Sensitivity) 233 (HH) <18 ng/L    Comment: CRITICAL VALUE NOTED. VALUE IS CONSISTENT WITH PREVIOUSLY REPORTED/CALLED VALUE (NOTE) Elevated high sensitivity troponin I (hsTnI) values and significant  changes across serial measurements may suggest ACS but many other  chronic and acute conditions are known to elevate hsTnI results.  Refer to the "Links" section for chest pain algorithms and additional  guidance. Performed at [facility], [address], [place], [place] [address]   Glucose, capillary     Status: Abnormal   Collection Time: 11/26/22  7:39 AM  Result Value Ref Range   Glucose-Capillary 258 (H) 70 - 99 mg/dL    Comment: Glucose reference range applies only to samples taken after fasting for at least 8 hours.  Glucose, capillary     Status: Abnormal   Collection Time: 11/26/22 10:26 AM  Result Value Ref Range   Glucose-Capillary 228 (H) 70 - 99 mg/dL    Comment: Glucose reference range applies only to samples taken after fasting for at least 8 hours.  Glucose, capillary     Status: Abnormal   Collection Time: 11/26/22 12:41 PM  Result Value Ref Range   Glucose-Capillary 257 (H) 70 - 99 mg/dL    Comment: Glucose reference range applies only to samples taken after fasting for at least 8 hours.  Glucose, capillary     Status: Abnormal   Collection Time: 11/26/22  4:40 PM  Result Value Ref Range   Glucose-Capillary 208 (H) 70 - 99 mg/dL    Comment: Glucose reference range applies only to samples taken after fasting for at least 8 hours.    CARDIAC CATHETERIZATION  Result Date: 11/26/2022   Prox RCA lesion is 60% stenosed.   Prox RCA to Mid RCA lesion is 90% stenosed.   Prox Cx to Mid Cx lesion is 30% stenosed.   Prox LAD to  Mid LAD lesion is 30% stenosed.   Mid LAD lesion is 20% stenosed.   Ost LM lesion is 40% stenosed.   Mid LM to Dist LM lesion is 70% stenosed.   LPAV lesion is 60% stenosed.   LV end diastolic pressure is severely elevated.   The left ventricular ejection fraction is 50-55% by visual estimate. 1.  Left dominant coronary arteries with significant eccentric distal left main stenosis.  The RCA is also very diseased but nondominant. 2.  Low normal LV systolic function with an EF of 50 to 55%.  Severely elevated left ventricular end-diastolic pressure at 33 mmHg. Recommendations: Recommend evaluation for CABG given distal left main involvement and diabetic status. LVEDP severely elevated but the patient received aggressive precath  hydration.    CT Angio Chest PE W and/or Wo Contrast  Result Date: 11/25/2022 CLINICAL DATA:  Chest pain.  Concern for pulmonary edema. EXAM: CT ANGIOGRAPHY CHEST WITH CONTRAST TECHNIQUE: Multidetector CT imaging of the chest was performed using the standard protocol during bolus administration of intravenous contrast. Multiplanar CT image reconstructions and MIPs were obtained to evaluate the vascular anatomy. RADIATION DOSE REDUCTION: This exam was performed according to the departmental dose-optimization program which includes automated exposure control, adjustment of the mA and/or kV according to patient size and/or use of iterative reconstruction technique. CONTRAST:  90mL OMNIPAQUE IOHEXOL 300 MG/ML  SOLN COMPARISON:  Chest radiograph dated 11/25/2022. CT dated 12/17/2021. FINDINGS: Evaluation of this exam is limited due to respiratory motion. Cardiovascular: There is no cardiomegaly or pericardial effusion. Two vessel coronary vascular calcification. Mild atherosclerotic calcification of the thoracic aorta. No aneurysmal dilatation or dissection. Evaluation of the pulmonary arteries is limited due to respiratory motion. No pulmonary artery embolus identified. Mediastinum/Nodes: No  hilar or mediastinal adenopathy. The esophagus and the thyroid gland are grossly unremarkable. No mediastinal fluid collection. Lungs/Pleura: The lungs are clear. There is no pleural effusion or pneumothorax. The central airways are patent. Upper Abdomen: No acute abnormality. Musculoskeletal: No acute osseous pathology. Review of the MIP images confirms the above findings. IMPRESSION: 1. No acute intrathoracic pathology. No CT evidence of pulmonary artery embolus. 2.  Aortic Atherosclerosis (ICD10-I70.0). Electronically Signed   By: Elgie Collard M.D.   On: 11/25/2022 20:28   DG Chest 2 View  Result Date: 11/25/2022 CLINICAL DATA:  Chest pain EXAM: CHEST - 2 VIEW COMPARISON:  X-ray 07/16/2014 FINDINGS: The heart size and mediastinal contours are within normal limits. No consolidation, pneumothorax or effusion. No edema. Normal cardiopericardial silhouette. Degenerative changes of the spine. IMPRESSION: No acute cardiopulmonary disease. Electronically Signed   By: Karen Kays M.D.   On: 11/25/2022 17:13    Review of Systems  HENT:  Negative for trouble swallowing and voice change.   Respiratory:  Positive for shortness of breath.   Cardiovascular:  Positive for chest pain and leg swelling. Negative for palpitations.  Neurological:  Negative for syncope and weakness.   Blood pressure 127/60, pulse 81, temperature 98 F (36.7 C), temperature source Oral, resp. rate 17, height 5\' 6"  (1.676 m), weight 108.8 kg, SpO2 94%. Physical Exam Vitals reviewed.  Constitutional:      General: She is not in acute distress.    Appearance: She is obese.  HENT:     Head: Normocephalic and atraumatic.  Eyes:     General: No scleral icterus.    Extraocular Movements: Extraocular movements intact.  Cardiovascular:     Rate and Rhythm: Normal rate and regular rhythm.     Heart sounds: Murmur (2/6 systolic) heard.  Pulmonary:     Effort: Pulmonary effort is normal. No respiratory distress.     Breath  sounds: Normal breath sounds.  Abdominal:     General: There is no distension.     Palpations: Abdomen is soft.  Skin:    General: Skin is warm and dry.  Neurological:     General: No focal deficit present.     Mental Status: She is oriented to person, place, and time.     Cranial Nerves: No cranial nerve deficit.   Mild edema BLE  Assessment/Plan: 62 yo woman with multiple cardiac risk factors presents with unstable CP and r/i for non-STEMI.  Cath shows 70% distal left main stenosis.  Severe disease  in small non dominant RCA.  EF mildly impaired.  CABG indicated for survival benefit and relief of symptoms.  I discussed the general nature of the procedure, including the need for general anesthesia, the incisions to be used, the use of cardiopulmonary bypass and the use of drainage tubes and temporary pacemaker wires with Catherine Moon and her husband.  We discussed the expected hospital stay, overall recovery and short and long term outcomes. I informed her of the indications, risks, benefits and alternatives.  She understands the risks include, but are not limited to death, stroke, MI, DVT/PE, bleeding, possible need for transfusion, infections, cardiac arrhythmias, as well as other organ system dysfunction including respiratory, renal, or GI complications.  Await echo report re: severity of aortic stenosis  Tentative plan for CABG Tuesday with Dr. Phyllis Ginger 11/26/2022, 6:01 PM

## 2022-11-26 NOTE — Progress Notes (Signed)
[facility] PROGRESS NOTE    Progress Note  Catherine Moon  WUJ:[id number] DOB: Mar 25, 1960 DOA: 11/25/2022 PCP: [place], Catherine Moon     Brief Narrative:   Catherine Moon is an 62 y.o. female past medical history of essential hypertension, hyperlipidemia, diabetes former smoker comes in with chest pain and with severe changes in her blood pressure with a systolic greater than 200 and tachycardic in the 130s, she relates that her symptoms started about 4 days prior to admission with new exertional chest pain, resolved with rest episodes lasting about 15 minutes.  Assessment/Plan:   Unstable angina/NSTEMI (non-ST elevated myocardial infarction) (HCC) Multiple risk factor with a classical history of exertional chest pain better with rest. Troponins in the 200.  Lead EKG is unremarkable. Cardiology was consulted recommended against heparin. Started on aspirin, 2D echo has been sent. Given Nitropaste, and her chest pain improved.   Start oral metoprolol nitroglycerin as needed. Keep NPO. Chest pain is resolved awaiting cardiology further recommendations.  Macrocytic anemia: In the setting of heavy menstrual periods. Folate greater than 40 B12 272.  Start B12 repletion. Try to keep hemoglobin greater than 8  Hypertensive urgency: Pressure at the PCP was greater than 200. Was recently started on Megace which can cause increase in blood pressure. She was started on her home regimen her blood pressure this morning is significantly improved 128/55 Question if her chest pain was likely due to elevated blood pressure, would likely need ischemic workup.  Hyperlipidemia: Continue statins.  Diabetes mellitus type 2 with hyperglycemia: At home she is on U-500 as well as Mounjaro.  POCS: Noted.    DVT prophylaxis: lovenox Family Communication:none Status is: Inpatient Remains inpatient appropriate because: Unstable angina    Code Status:     Code Status  Orders  (From admission, onward)           Start     Ordered   11/25/22 2305  Full code  Continuous       Question:  By:  Answer:  Consent: discussion documented in EHR   11/25/22 2307           Code Status History     This patient has a current code status but no historical code status.         IV Access:   Peripheral IV   Procedures and diagnostic studies:   CT Angio Chest PE W and/or Wo Contrast  Result Date: 11/25/2022 CLINICAL DATA:  Chest pain.  Concern for pulmonary edema. EXAM: CT ANGIOGRAPHY CHEST WITH CONTRAST TECHNIQUE: Multidetector CT imaging of the chest was performed using the standard protocol during bolus administration of intravenous contrast. Multiplanar CT image reconstructions and MIPs were obtained to evaluate the vascular anatomy. RADIATION DOSE REDUCTION: This exam was performed according to the departmental dose-optimization program which includes automated exposure control, adjustment of the mA and/or kV according to patient size and/or use of iterative reconstruction technique. CONTRAST:  90mL OMNIPAQUE IOHEXOL 300 MG/ML  SOLN COMPARISON:  Chest radiograph dated 11/25/2022. CT dated 12/17/2021. FINDINGS: Evaluation of this exam is limited due to respiratory motion. Cardiovascular: There is no cardiomegaly or pericardial effusion. Two vessel coronary vascular calcification. Mild atherosclerotic calcification of the thoracic aorta. No aneurysmal dilatation or dissection. Evaluation of the pulmonary arteries is limited due to respiratory motion. No pulmonary artery embolus identified. Mediastinum/Nodes: No hilar or mediastinal adenopathy. The esophagus and the thyroid gland are grossly unremarkable. No mediastinal fluid collection. Lungs/Pleura: The lungs are clear. There is  no pleural effusion or pneumothorax. The central airways are patent. Upper Abdomen: No acute abnormality. Musculoskeletal: No acute osseous pathology. Review of the MIP images confirms  the above findings. IMPRESSION: 1. No acute intrathoracic pathology. No CT evidence of pulmonary artery embolus. 2.  Aortic Atherosclerosis (ICD10-I70.0). Electronically Signed   By: Elgie Collard M.D.   On: 11/25/2022 20:28   DG Chest 2 View  Result Date: 11/25/2022 CLINICAL DATA:  Chest pain EXAM: CHEST - 2 VIEW COMPARISON:  X-ray 07/16/2014 FINDINGS: The heart size and mediastinal contours are within normal limits. No consolidation, pneumothorax or effusion. No edema. Normal cardiopericardial silhouette. Degenerative changes of the spine. IMPRESSION: No acute cardiopulmonary disease. Electronically Signed   By: Karen Kays M.D.   On: 11/25/2022 17:13     Medical Consultants:   None.   Subjective:    Catherine Moon relates she has been chest pain-free shortness of breath free.  Objective:    Vitals:   11/26/22 0335 11/26/22 0400 11/26/22 0402 11/26/22 0432  BP: (!) 132/49 (!) 150/58 (!) 150/58   Pulse: (!) 102 100 100   Resp: (!) 22  20   Temp: 98.3 F (36.8 C)  98.6 F (37 C)   TempSrc: Axillary  Oral   SpO2: 95% 97% 97%   Weight:    108.8 kg  Height:       SpO2: 97 %   Intake/Output Summary (Last 24 hours) at 11/26/2022 0730 Last data filed at 11/26/2022 0335 Gross per 24 hour  Intake 566 ml  Output --  Net 566 ml   Filed Weights   11/25/22 1518 11/25/22 2303 11/26/22 0432  Weight: 113.4 kg 108.8 kg 108.8 kg    Exam: General exam: In no acute distress. Respiratory system: Good air movement and clear to auscultation. Cardiovascular system: S1 & S2 heard, RRR. No JVD. Gastrointestinal system: Abdomen is nondistended, soft and nontender.  Extremities: No pedal edema. Skin: No rashes, lesions or ulcers Psychiatry: Judgement and insight appear normal. Mood & affect appropriate.    Data Reviewed:    Labs: Basic Metabolic Panel: Recent Labs  Lab 11/25/22 1533 11/26/22 0619  NA 135 135  K 3.9 3.9  CL 103 103  CO2 23 22  GLUCOSE 316*  281*  BUN 10 9  CREATININE 0.88 0.67  CALCIUM 9.5 9.1  MG  --  1.9  PHOS  --  4.5   GFR Estimated Creatinine Clearance: 92.2 mL/min (by C-G formula based on SCr of 0.67 mg/dL). Liver Function Tests: No results for input(s): "AST", "ALT", "ALKPHOS", "BILITOT", "PROT", "ALBUMIN" in the last 168 hours. No results for input(s): "LIPASE", "AMYLASE" in the last 168 hours. No results for input(s): "AMMONIA" in the last 168 hours. Coagulation profile Recent Labs  Lab 11/26/22 0619  INR 1.0   COVID-19 Labs  Recent Labs    11/25/22 2322  FERRITIN 33    No results found for: "SARSCOV2NAA"  CBC: Recent Labs  Lab 11/25/22 1533 11/26/22 0619  WBC 14.3* 12.4*  HGB 7.9* 8.5*  HCT 25.2* 26.9*  MCV 102.0* 99.3  PLT 439* 417*   Cardiac Enzymes: No results for input(s): "CKTOTAL", "CKMB", "CKMBINDEX", "TROPONINI" in the last 168 hours. BNP (last 3 results) No results for input(s): "PROBNP" in the last 8760 hours. CBG: Recent Labs  Lab 11/25/22 2346  GLUCAP 192*   D-Dimer: No results for input(s): "DDIMER" in the last 72 hours. Hgb A1c: No results for input(s): "HGBA1C" in the last 72 hours. Lipid  Profile: Recent Labs    11/26/22 0619  CHOL 102  HDL 36*  LDLCALC 42  TRIG 409  CHOLHDL 2.8   Thyroid function studies: No results for input(s): "TSH", "T4TOTAL", "T3FREE", "THYROIDAB" in the last 72 hours.  Invalid input(s): "FREET3" Anemia work up: Recent Labs    11/25/22 2322 11/25/22 2328  VITAMINB12  --  267  FOLATE >40.0  --   FERRITIN 33  --   TIBC 414  --   IRON 60  --    Sepsis Labs: Recent Labs  Lab 11/25/22 1533 11/26/22 0619  WBC 14.3* 12.4*   Microbiology No results found for this or any previous visit (from the past 240 hour(s)).   Medications:    aspirin  81 mg Oral Daily   atorvastatin  80 mg Oral Daily   ezetimibe  10 mg Oral Daily   ferrous sulfate  325 mg Oral Q breakfast   heparin injection (subcutaneous)  5,000 Units  Subcutaneous Q8H   lisinopril  20 mg Oral Daily   And   hydrochlorothiazide  12.5 mg Oral Daily   insulin aspart  0-20 Units Subcutaneous TID WC   insulin NPH Human  25 Units Subcutaneous BID AC & HS   loratadine  10 mg Oral Daily   sodium chloride flush  3 mL Intravenous Q12H   Continuous Infusions:    LOS: 1 day   Marinda Elk  [facility]  11/26/2022, 7:30 AM

## 2022-11-26 NOTE — Consult Note (Addendum)
Cardiology Consultation   Patient ID: Sutten Junes MRN: [id number]; DOB: 1960-05-12  Admit date: 11/25/2022 Date of Consult: 11/26/2022  PCP:  [place], Betty G, MD   [facility] Providers Cardiologist:  None        Patient Profile:   Catherine Moon is a 62 y.o. female with a hx of DM, HTN, HLD, tob use, who is being seen 11/26/2022 for the evaluation of chest pain at the request of Dr David Stall.  History of Present Illness:   Ms. Sullens 2012 for atypical chest pain, had ETT with no ST changes and no CP w/ exertion.  She has not had chest pain since then.  She had an echo 01/2021 performed because of a new murmur, she had mild aortic stenosis.  Starting last Sunday, 6 days ago, she began having chest pain.  It would come on with exertion such as walking around the house or doing housework.  It would start in her left shoulder, move across her chest, and go up into her neck and upper back.  It was associated with shortness of breath and mild diaphoresis, but no nausea or vomiting.  Each episode would last 10 to 15 minutes.  She did not try any medications, but would sit down and try to be very calm and take deep breaths.  She felt this helped.  Over the last several days, she has had approximately 10 episodes of this chest pain.  Last p.m., she had the worst of the episodes where the pain reached a 10/10.  That is why she came to the hospital.  She got aspirin 324 mg, and was started on subcu heparin.  She is currently pain-free.  She has never had pain like this before.  All of note, she has had adrenal problems with excess testosterone.  She also had postmenopausal vaginal bleeding, but this improved greatly on megestrol.  Her Mounjaro injection is on Thursdays.   Past Medical History:  Diagnosis Date   CTS (carpal tunnel syndrome)    DM (diabetes mellitus) (HCC)    Dyslipidemia    Heart murmur    had ECHO 01-2021   HTN  (hypertension)    Hypercholesterolemia    Seasonal allergies    Smoker    Warts, genital     Past Surgical History:  Procedure Laterality Date   COLONOSCOPY     CYST EXCISION Right 03/26/2021   Procedure: RIGHT AXILLARY SEBACEOUS CYST EXCISION;  Surgeon: Emelia Loron, MD;  Location: [facility];  Service: General;  Laterality: Right;  LOCAL   DILATION AND CURETTAGE OF UTERUS     WISDOM TOOTH EXTRACTION       Home Medications:  Prior to Admission medications   Medication Sig Start Date End Date Taking? Authorizing Provider  acetaminophen (TYLENOL) 500 MG tablet Take 1,000 mg by mouth every 6 (six) hours as needed for mild pain, moderate pain, fever or headache.   Yes [provider]  aspirin 81 MG EC tablet Take 81 mg by mouth daily.   Yes [provider]  atorvastatin (LIPITOR) 80 MG tablet Take 1 tablet (80 mg total) by mouth daily. Due for physical. 10/01/22  Yes [place], Betty G, MD  calcium carbonate (OSCAL) 1500 (600 Ca) MG TABS tablet Take 1,200 mg of elemental calcium by mouth daily with breakfast.   Yes [provider]  ezetimibe (ZETIA) 10 MG tablet Take 1 tablet (10 mg total) by mouth daily. 09/20/22  Yes Gherghe,  Silvestre Mesi, MD  ferrous sulfate 325 (65 FE) MG tablet Take 325 mg by mouth daily with breakfast.   Yes [provider]  Fexofenadine HCl (ALLEGRA PO) Take 120 mg by mouth daily.   Yes [provider]  insulin regular human CONCENTRATED (HUMULIN R U-500 KWIKPEN) 500 UNIT/ML KwikPen Inject 80-100 Units into the skin 3 (three) times daily before meals. Patient taking differently: Inject 100 Units into the skin 3 (three) times daily before meals. 09/14/22  Yes Carlus Pavlov, MD  lisinopril-hydrochlorothiazide (ZESTORETIC) 20-12.5 MG tablet TAKE 1 TABLET BY MOUTH EVERY DAY 05/24/22  Yes [place], Betty G, MD  Loratadine (CLARITIN PO) Take 10 mg by mouth daily.   Yes [provider]  magnesium oxide  (MAG-OX) 400 MG tablet Take 400 mg by mouth daily.   Yes [provider]  megestrol (MEGACE) 40 MG tablet Take 40 mg by mouth daily. TAKE ONE TABLET BY MOUTH TWO TIMES A DAY FOR 3 DAYS AND THEN 1 TABLET DAILY FOR 15 DAYS 11/22/22  Yes [provider]  Multiple Vitamin (MULTIVITAMIN WITH MINERALS) TABS tablet Take 1 tablet by mouth daily.   Yes [provider]  Potassium 99 MG TABS Take 1 tablet by mouth daily.   Yes [provider]  tirzepatide Greggory Keen) 10 MG/0.5ML Pen Inject 10 mg into the skin once a week. Patient taking differently: Inject 10 mg into the skin once a week. Thursday 09/14/22  Yes Carlus Pavlov, MD  zinc gluconate 50 MG tablet Take 50 mg by mouth daily.   Yes [provider]  Continuous Glucose Sensor (FREESTYLE LIBRE 3 SENSOR) MISC Place 1 sensor on the skin every 14 days. Use to check glucose continuously 09/14/22   Carlus Pavlov, MD  Insulin Pen Needle 32G X 4 MM MISC Use 4x a day 05/18/21   Carlus Pavlov, MD    Inpatient Medications: Scheduled Meds:  aspirin  81 mg Oral Daily   atorvastatin  80 mg Oral Daily   cyanocobalamin  1,000 mcg Intramuscular Daily   ezetimibe  10 mg Oral Daily   ferrous sulfate  325 mg Oral Q breakfast   heparin injection (subcutaneous)  5,000 Units Subcutaneous Q8H   lisinopril  20 mg Oral Daily   And   hydrochlorothiazide  12.5 mg Oral Daily   insulin aspart  0-20 Units Subcutaneous TID WC   insulin aspart  0-5 Units Subcutaneous QHS   insulin aspart  6 Units Subcutaneous TID WC   insulin detemir  50 Units Subcutaneous BID   loratadine  10 mg Oral Daily   metoprolol tartrate  25 mg Oral BID   sodium chloride flush  3 mL Intravenous Q12H   Continuous Infusions:  PRN Meds: acetaminophen, nitroGLYCERIN  Allergies:   No Known Allergies  Social History:   Social History   Socioeconomic History   Marital status: Married    Spouse name: Not on file   Number of children: Not on  file   Years of education: Not on file   Highest education level: Associate degree: academic program  Occupational History   Not on file  Tobacco Use   Smoking status: Former    Current packs/day: 0.00    Average packs/day: 1 pack/day for 26.0 years (26.0 ttl pk-yrs)    Types: Cigarettes    Start date: 08/12/1981    Quit date: 08/13/2007    Years since quitting: 15.2   Smokeless tobacco: Never   Tobacco comments:     She smoked one  pack per day for 26 years but quit in 2009.  Vaping Use   Vaping status: Never Used  Substance and Sexual Activity   Alcohol use: Yes    Comment: social   Drug use: No   Sexual activity: Not on file  Other Topics Concern   Not on file  Social History Narrative   Works 3rd shift in [employer], married.  She smoked one pack per day for 26 years but quit in 2009.         Social Determinants of Health   Financial Resource Strain: Low Risk  (11/25/2022)   Overall Financial Resource Strain (CARDIA)    Difficulty of Paying Living Expenses: Not hard at all  Food Insecurity: No Food Insecurity (11/25/2022)   Hunger Vital Sign    Worried About Running Out of Food in the Last Year: Never true    Ran Out of Food in the Last Year: Never true  Transportation Needs: No Transportation Needs (11/25/2022)   PRAPARE - Administrator, Civil Service (Medical): No    Lack of Transportation (Non-Medical): No  Physical Activity: Unknown (11/25/2022)   Exercise Vital Sign    Days of Exercise per Week: Patient declined    Minutes of Exercise per Session: Not on file  Stress: No Stress Concern Present (11/25/2022)   [employer] of Occupational Health - Occupational Stress Questionnaire    Feeling of Stress : Only a little  Social Connections: Socially Isolated (11/25/2022)   Social Connection and Isolation Panel [NHANES]    Frequency of Communication with Friends and Family: Once a week    Frequency of Social Gatherings with Friends and  Family: Never    Attends Religious Services: Never    Database administrator or Organizations: No    Attends Engineer, structural: Not on file    Marital Status: Married  Catering manager Violence: Not on file    Family History:   Family History  Problem Relation Age of Onset   Breast cancer Mother    Heart disease Mother    Cancer Mother        Breast   Coronary artery disease Father        starting in his 12s. A bypass and mitral infarction   Heart disease Father        Coronary Disease, bypass and mitral infarction   Colon cancer Paternal Grandmother      ROS:  Please see the history of present illness.  All other ROS reviewed and negative.     Physical Exam/Data:   Vitals:   11/26/22 0400 11/26/22 0402 11/26/22 0432 11/26/22 0711  BP: (!) 150/58 (!) 150/58  (!) 128/55  Pulse: 100 100  91  Resp:  20  17  Temp:  98.6 F (37 C)  97.8 F (36.6 C)  TempSrc:  Oral  Oral  SpO2: 97% 97%  96%  Weight:   108.8 kg   Height:        Intake/Output Summary (Last 24 hours) at 11/26/2022 1150 Last data filed at 11/26/2022 0829 Gross per 24 hour  Intake 569 ml  Output --  Net 569 ml      11/26/2022    4:32 AM 11/25/2022   11:03 PM 11/25/2022    3:18 PM  Last 3 Weights  Weight (lbs) 239 lb 13.8 oz 239 lb 13.8 oz 250 lb  Weight (kg) 108.8 kg 108.8 kg 113.399 kg     Body mass  index is 38.71 kg/m.  General:  Well nourished, well developed, obese female, in no acute distress HEENT: normal Neck: no JVD Vascular: No carotid bruits; 4/4 Distal pulses 2+ bilaterally Cardiac:  normal S1, S2; RRR; soft murmur  Lungs:  clear to auscultation bilaterally, no wheezing, rhonchi or rales  Abd: soft, nontender, no hepatomegaly  Ext: no edema Musculoskeletal:  No deformities, BUE and BLE strength normal and equal Skin: warm and dry  Neuro:  CNs 2-12 intact, no focal abnormalities noted Psych:  Normal affect   EKG:  The EKG was personally reviewed and demonstrates:  ST,  HR 111, mild ST depression in lead I, T wave changes in avL Telemetry:  Telemetry was personally reviewed and demonstrates:  ST  Relevant CV Studies:  ECHO: Ordered  ECHO: 01/06/2021  1. Left ventricular ejection fraction, by estimation, is 60 to 65%. The left ventricle has normal function. The left ventricle has no regional wall motion abnormalities. Left ventricular diastolic parameters are consistent with Grade I diastolic dysfunction (impaired relaxation).   2. Right ventricular systolic function is normal. The right ventricular size is normal. There is normal pulmonary artery systolic pressure.   3. Left atrial size was severely dilated.   4. The mitral valve is normal in structure. Trivial mitral valve  regurgitation. No evidence of mitral stenosis.   5. Functionally bicuspid with calcification and partial fusion of the left and noncoronary cusps. The aortic valve is calcified. There is moderate calcification of the aortic valve. There is moderate thickening of the aortic valve. Aortic valve  regurgitation is trivial. No aortic stenosis is present. Aortic valve  area, by VTI measures 1.96 cm. Aortic valve mean gradient measures 6.7 mmHg. Aortic valve peak gradient measures  12.5 mmHg. Aortic valve Vmax measures 1.77 m/s.   6. The inferior vena cava is normal in size with greater than 50% respiratory variability, suggesting RA pressure of 3 mmHg.   Laboratory Data:  High Sensitivity Troponin:   Recent Labs  Lab 11/25/22 1533 11/25/22 1730 11/25/22 2322 11/26/22 0619  TROPONINIHS 138* 140* 279* 233*     Chemistry Recent Labs  Lab 11/25/22 1533 11/26/22 0619  NA 135 135  K 3.9 3.9  CL 103 103  CO2 23 22  GLUCOSE 316* 281*  BUN 10 9  CREATININE 0.88 0.67  CALCIUM 9.5 9.1  MG  --  1.9  GFRNONAA >60 >60  ANIONGAP 9 10    No results for input(s): "PROT", "ALBUMIN", "AST", "ALT", "ALKPHOS", "BILITOT" in the last 168 hours. Lipids  Recent Labs  Lab 11/26/22 0619  CHOL  102  TRIG 122  HDL 36*  LDLCALC 42  CHOLHDL 2.8    Hematology Recent Labs  Lab 11/25/22 1533 11/26/22 0619  WBC 14.3* 12.4*  RBC 2.47* 2.71*  HGB 7.9* 8.5*  HCT 25.2* 26.9*  MCV 102.0* 99.3  MCH 32.0 31.4  MCHC 31.3 31.6  RDW 18.0* 18.2*  PLT 439* 417*   Thyroid No results for input(s): "TSH", "FREET4" in the last 168 hours.  BNPNo results for input(s): "BNP", "PROBNP" in the last 168 hours.  DDimer No results for input(s): "DDIMER" in the last 168 hours.   Radiology/Studies:  CT Angio Chest PE W and/or Wo Contrast  Result Date: 11/25/2022 CLINICAL DATA:  Chest pain.  Concern for pulmonary edema. EXAM: CT ANGIOGRAPHY CHEST WITH CONTRAST TECHNIQUE: Multidetector CT imaging of the chest was performed using the standard protocol during bolus administration of intravenous contrast. Multiplanar CT image reconstructions and  MIPs were obtained to evaluate the vascular anatomy. RADIATION DOSE REDUCTION: This exam was performed according to the departmental dose-optimization program which includes automated exposure control, adjustment of the mA and/or kV according to patient size and/or use of iterative reconstruction technique. CONTRAST:  90mL OMNIPAQUE IOHEXOL 300 MG/ML  SOLN COMPARISON:  Chest radiograph dated 11/25/2022. CT dated 12/17/2021. FINDINGS: Evaluation of this exam is limited due to respiratory motion. Cardiovascular: There is no cardiomegaly or pericardial effusion. Two vessel coronary vascular calcification. Mild atherosclerotic calcification of the thoracic aorta. No aneurysmal dilatation or dissection. Evaluation of the pulmonary arteries is limited due to respiratory motion. No pulmonary artery embolus identified. Mediastinum/Nodes: No hilar or mediastinal adenopathy. The esophagus and the thyroid gland are grossly unremarkable. No mediastinal fluid collection. Lungs/Pleura: The lungs are clear. There is no pleural effusion or pneumothorax. The central airways are patent.  Upper Abdomen: No acute abnormality. Musculoskeletal: No acute osseous pathology. Review of the MIP images confirms the above findings. IMPRESSION: 1. No acute intrathoracic pathology. No CT evidence of pulmonary artery embolus. 2.  Aortic Atherosclerosis (ICD10-I70.0). Electronically Signed   By: Elgie Collard M.D.   On: 11/25/2022 20:28   DG Chest 2 View  Result Date: 11/25/2022 CLINICAL DATA:  Chest pain EXAM: CHEST - 2 VIEW COMPARISON:  X-ray 07/16/2014 FINDINGS: The heart size and mediastinal contours are within normal limits. No consolidation, pneumothorax or effusion. No edema. Normal cardiopericardial silhouette. Degenerative changes of the spine. IMPRESSION: No acute cardiopulmonary disease. Electronically Signed   By: Karen Kays M.D.   On: 11/25/2022 17:13     Assessment and Plan:   Non-STEMI - Patient has been having consistent exertional chest pain with mild exertion for the last 6 days.  The worst episode was last p.m. and the reason she came to the hospital. - mult CRFs - She has elevation in her cardiac enzymes, as well as two-vessel coronary calcifications on CT - Cardiac catheterization was discussed with the patient fully. The patient understands that risks include but are not limited to stroke (1 in 1000), death (1 in 1000), kidney failure [usually temporary] (1 in 500), bleeding (1 in 200), allergic reaction [possibly serious] (1 in 200).  The patient understands and is willing to proceed.   -Because of the anemia, we will do a diagnostic cath only today and staged PCI for next week  2. Anemia - pt states she has had very heavy vaginal bleeding since last September.  She is postmenopausal. -On 11/22/2022, she was put on megestrol. -She states this has been a godsend, the bleeding has reduced to spotting in just a few days. -Iron levels are within normal limits, she was transfused 1 unit of packed cells -Hemoglobin improved from 7.9->8.5 -Management per IM, follow H&H  daily  3.  Diabetes -She takes insulin, and Mounjaro for her diabetes.  She has a freestyle continuous glucose monitor. -Her A1c this admission is 7.5 -Management per IM  4.  Hyperlipidemia: -She takes Lipitor 80 mg daily. -Lipid profile done today is above, HDL is 36 and LDL is 42 -Continue statin  5.  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 -She is not on any medication for this -This and other issues per IM  Risk Assessment/Risk Scores:     TIMI Risk Score for Unstable Angina or Non-ST Elevation MI:   The patient's TIMI risk score is 3, which indicates a 13% risk of all cause  mortality, new or recurrent myocardial infarction or need for urgent revascularization in the next 14 days.   For questions or updates, please contact  [facility] Please consult [email or web address] for contact info under    Signed, Theodore Demark, PA-C  11/26/2022 11:50 AM  I have personally seen and examined this patient. I agree with the assessment and plan as outlined above. 62 yo female with history of DM, HTN, HLD, tobacco abuse and obesity with recent finding of anemia admitted with hypertensive urgency and found to have elevated troponin. She describes chest pain with exertion at home over the past week. EKG with sinus tachycardia with non-specific ST abnormalities. Troponin 138-->279-->233. No chest pain currently. Of note, she is found to be anemic on admission and reports heavy menstrual bleeding. She has been seen by Gyn and was started on Megace with improvement in vaginal bleeding. She was given 1 unit of pRBCs last night.   Labs reviewed. EKG personally reviewed and noted above. My exam: CV:RRR, no murmurs. Lungs: clear bilaterally Ext: no edema BP is now controlled.  Plan: Chest pain and elevated troponin in setting of hypertensive urgency (NSTEMI): Her elevated troponin may be due to demand  ischemia but high pre-test probability for CAD. (Risk factors obesity, age, HTN, HLD, tobacco abuse). I think a diagnostic cardiac catheterization is indicated to exclude obstructive CAD. She is anemic but reports heavy menses. No other active bleeding. Given her anemia, I would recommend that we proceed with Diagnostic only cardiac cath today without PCI if this indicated.   Will need to address long term safety of Megace in regards to possible hyper-coagulable state while taking this medication.    I have reviewed the risks, indications, and alternatives to cardiac catheterization, possible angioplasty, and stenting with the patient. Risks include but are not limited to bleeding, infection, vascular injury, stroke, myocardial infection, arrhythmia, kidney injury, radiation-related injury in the case of prolonged fluoroscopy use, emergency cardiac surgery, and death. The patient understands the risks of serious complication is 1-2 in 1000 with diagnostic cardiac cath and 1-2% or less with angioplasty/stenting.  Verne Carrow, MD, [facility] 11/26/2022  12:01 PM

## 2022-11-26 NOTE — H&P (View-Only) (Signed)
Cardiology Consultation   Patient ID: Catherine Moon MRN: [id number]; DOB: 1960-09-16  Admit date: 11/25/2022 Date of Consult: 11/26/2022  PCP:  [place], Betty G, MD   [facility] Providers Cardiologist:  None        Patient Profile:   Catherine Moon is a 62 y.o. female with a hx of DM, HTN, HLD, tob use, who is being seen 11/26/2022 for the evaluation of chest pain at the request of Dr David Stall.  History of Present Illness:   Catherine Moon 2012 for atypical chest pain, had ETT with no ST changes and no CP w/ exertion.  She has not had chest pain since then.  She had an echo 01/2021 performed because of a new murmur, she had mild aortic stenosis.  Starting last Sunday, 6 days ago, she began having chest pain.  It would come on with exertion such as walking around the house or doing housework.  It would start in her left shoulder, move across her chest, and go up into her neck and upper back.  It was associated with shortness of breath and mild diaphoresis, but no nausea or vomiting.  Each episode would last 10 to 15 minutes.  She did not try any medications, but would sit down and try to be very calm and take deep breaths.  She felt this helped.  Over the last several days, she has had approximately 10 episodes of this chest pain.  Last p.m., she had the worst of the episodes where the pain reached a 10/10.  That is why she came to the hospital.  She got aspirin 324 mg, and was started on subcu heparin.  She is currently pain-free.  She has never had pain like this before.  All of note, she has had adrenal problems with excess testosterone.  She also had postmenopausal vaginal bleeding, but this improved greatly on megestrol.  Her Mounjaro injection is on Thursdays.   Past Medical History:  Diagnosis Date   CTS (carpal tunnel syndrome)    DM (diabetes mellitus) (HCC)    Dyslipidemia    Heart murmur    had ECHO 01-2021   HTN  (hypertension)    Hypercholesterolemia    Seasonal allergies    Smoker    Warts, genital     Past Surgical History:  Procedure Laterality Date   COLONOSCOPY     CYST EXCISION Right 03/26/2021   Procedure: RIGHT AXILLARY SEBACEOUS CYST EXCISION;  Surgeon: Emelia Loron, MD;  Location:  SURGERY CENTER;  Service: General;  Laterality: Right;  LOCAL   DILATION AND CURETTAGE OF UTERUS     WISDOM TOOTH EXTRACTION       Home Medications:  Prior to Admission medications   Medication Sig Start Date End Date Taking? Authorizing Provider  acetaminophen (TYLENOL) 500 MG tablet Take 1,000 mg by mouth every 6 (six) hours as needed for mild pain, moderate pain, fever or headache.   Yes [provider]  aspirin 81 MG EC tablet Take 81 mg by mouth daily.   Yes [provider]  atorvastatin (LIPITOR) 80 MG tablet Take 1 tablet (80 mg total) by mouth daily. Due for physical. 10/01/22  Yes [place], Betty G, MD  calcium carbonate (OSCAL) 1500 (600 Ca) MG TABS tablet Take 1,200 mg of elemental calcium by mouth daily with breakfast.   Yes [provider]  ezetimibe (ZETIA) 10 MG tablet Take 1 tablet (10 mg total) by mouth daily. 09/20/22  Yes Gherghe,  Silvestre Mesi, MD  ferrous sulfate 325 (65 FE) MG tablet Take 325 mg by mouth daily with breakfast.   Yes [provider]  Fexofenadine HCl (ALLEGRA PO) Take 120 mg by mouth daily.   Yes [provider]  insulin regular human CONCENTRATED (HUMULIN R U-500 KWIKPEN) 500 UNIT/ML KwikPen Inject 80-100 Units into the skin 3 (three) times daily before meals. Patient taking differently: Inject 100 Units into the skin 3 (three) times daily before meals. 09/14/22  Yes Carlus Pavlov, MD  lisinopril-hydrochlorothiazide (ZESTORETIC) 20-12.5 MG tablet TAKE 1 TABLET BY MOUTH EVERY DAY 05/24/22  Yes [place], Betty G, MD  Loratadine (CLARITIN PO) Take 10 mg by mouth daily.   Yes [provider]  magnesium oxide  (MAG-OX) 400 MG tablet Take 400 mg by mouth daily.   Yes [provider]  megestrol (MEGACE) 40 MG tablet Take 40 mg by mouth daily. TAKE ONE TABLET BY MOUTH TWO TIMES A DAY FOR 3 DAYS AND THEN 1 TABLET DAILY FOR 15 DAYS 11/22/22  Yes [provider]  Multiple Vitamin (MULTIVITAMIN WITH MINERALS) TABS tablet Take 1 tablet by mouth daily.   Yes [provider]  Potassium 99 MG TABS Take 1 tablet by mouth daily.   Yes [provider]  tirzepatide Greggory Keen) 10 MG/0.5ML Pen Inject 10 mg into the skin once a week. Patient taking differently: Inject 10 mg into the skin once a week. Thursday 09/14/22  Yes Carlus Pavlov, MD  zinc gluconate 50 MG tablet Take 50 mg by mouth daily.   Yes [provider]  Continuous Glucose Sensor (FREESTYLE LIBRE 3 SENSOR) MISC Place 1 sensor on the skin every 14 days. Use to check glucose continuously 09/14/22   Carlus Pavlov, MD  Insulin Pen Needle 32G X 4 MM MISC Use 4x a day 05/18/21   Carlus Pavlov, MD    Inpatient Medications: Scheduled Meds:  aspirin  81 mg Oral Daily   atorvastatin  80 mg Oral Daily   cyanocobalamin  1,000 mcg Intramuscular Daily   ezetimibe  10 mg Oral Daily   ferrous sulfate  325 mg Oral Q breakfast   heparin injection (subcutaneous)  5,000 Units Subcutaneous Q8H   lisinopril  20 mg Oral Daily   And   hydrochlorothiazide  12.5 mg Oral Daily   insulin aspart  0-20 Units Subcutaneous TID WC   insulin aspart  0-5 Units Subcutaneous QHS   insulin aspart  6 Units Subcutaneous TID WC   insulin detemir  50 Units Subcutaneous BID   loratadine  10 mg Oral Daily   metoprolol tartrate  25 mg Oral BID   sodium chloride flush  3 mL Intravenous Q12H   Continuous Infusions:  PRN Meds: acetaminophen, nitroGLYCERIN  Allergies:   No Known Allergies  Social History:   Social History   Socioeconomic History   Marital status: Married    Spouse name: Not on file   Number of children: Not on  file   Years of education: Not on file   Highest education level: Associate degree: academic program  Occupational History   Not on file  Tobacco Use   Smoking status: Former    Current packs/day: 0.00    Average packs/day: 1 pack/day for 26.0 years (26.0 ttl pk-yrs)    Types: Cigarettes    Start date: 08/12/1981    Quit date: 08/13/2007    Years since quitting: 15.2   Smokeless tobacco: Never   Tobacco comments:     She smoked one  pack per day for 26 years but quit in 2009.  Vaping Use   Vaping status: Never Used  Substance and Sexual Activity   Alcohol use: Yes    Comment: social   Drug use: No   Sexual activity: Not on file  Other Topics Concern   Not on file  Social History Narrative   Works 3rd shift in [employer], married.  She smoked one pack per day for 26 years but quit in 2009.         Social Determinants of Health   Financial Resource Strain: Low Risk  (11/25/2022)   Overall Financial Resource Strain (CARDIA)    Difficulty of Paying Living Expenses: Not hard at all  Food Insecurity: No Food Insecurity (11/25/2022)   Hunger Vital Sign    Worried About Running Out of Food in the Last Year: Never true    Ran Out of Food in the Last Year: Never true  Transportation Needs: No Transportation Needs (11/25/2022)   PRAPARE - Administrator, Civil Service (Medical): No    Lack of Transportation (Non-Medical): No  Physical Activity: Unknown (11/25/2022)   Exercise Vital Sign    Days of Exercise per Week: Patient declined    Minutes of Exercise per Session: Not on file  Stress: No Stress Concern Present (11/25/2022)   [employer] of Occupational Health - Occupational Stress Questionnaire    Feeling of Stress : Only a little  Social Connections: Socially Isolated (11/25/2022)   Social Connection and Isolation Panel [NHANES]    Frequency of Communication with Friends and Family: Once a week    Frequency of Social Gatherings with Friends and  Family: Never    Attends Religious Services: Never    Database administrator or Organizations: No    Attends Engineer, structural: Not on file    Marital Status: Married  Catering manager Violence: Not on file    Family History:   Family History  Problem Relation Age of Onset   Breast cancer Mother    Heart disease Mother    Cancer Mother        Breast   Coronary artery disease Father        starting in his 47s. A bypass and mitral infarction   Heart disease Father        Coronary Disease, bypass and mitral infarction   Colon cancer Paternal Grandmother      ROS:  Please see the history of present illness.  All other ROS reviewed and negative.     Physical Exam/Data:   Vitals:   11/26/22 0400 11/26/22 0402 11/26/22 0432 11/26/22 0711  BP: (!) 150/58 (!) 150/58  (!) 128/55  Pulse: 100 100  91  Resp:  20  17  Temp:  98.6 F (37 C)  97.8 F (36.6 C)  TempSrc:  Oral  Oral  SpO2: 97% 97%  96%  Weight:   108.8 kg   Height:        Intake/Output Summary (Last 24 hours) at 11/26/2022 1150 Last data filed at 11/26/2022 0829 Gross per 24 hour  Intake 569 ml  Output --  Net 569 ml      11/26/2022    4:32 AM 11/25/2022   11:03 PM 11/25/2022    3:18 PM  Last 3 Weights  Weight (lbs) 239 lb 13.8 oz 239 lb 13.8 oz 250 lb  Weight (kg) 108.8 kg 108.8 kg 113.399 kg     Body mass  index is 38.71 kg/m.  General:  Well nourished, well developed, obese female, in no acute distress HEENT: normal Neck: no JVD Vascular: No carotid bruits; 4/4 Distal pulses 2+ bilaterally Cardiac:  normal S1, S2; RRR; soft murmur  Lungs:  clear to auscultation bilaterally, no wheezing, rhonchi or rales  Abd: soft, nontender, no hepatomegaly  Ext: no edema Musculoskeletal:  No deformities, BUE and BLE strength normal and equal Skin: warm and dry  Neuro:  CNs 2-12 intact, no focal abnormalities noted Psych:  Normal affect   EKG:  The EKG was personally reviewed and demonstrates:  ST,  HR 111, mild ST depression in lead I, T wave changes in avL Telemetry:  Telemetry was personally reviewed and demonstrates:  ST  Relevant CV Studies:  ECHO: Ordered  ECHO: 01/06/2021  1. Left ventricular ejection fraction, by estimation, is 60 to 65%. The left ventricle has normal function. The left ventricle has no regional wall motion abnormalities. Left ventricular diastolic parameters are consistent with Grade I diastolic dysfunction (impaired relaxation).   2. Right ventricular systolic function is normal. The right ventricular size is normal. There is normal pulmonary artery systolic pressure.   3. Left atrial size was severely dilated.   4. The mitral valve is normal in structure. Trivial mitral valve  regurgitation. No evidence of mitral stenosis.   5. Functionally bicuspid with calcification and partial fusion of the left and noncoronary cusps. The aortic valve is calcified. There is moderate calcification of the aortic valve. There is moderate thickening of the aortic valve. Aortic valve  regurgitation is trivial. No aortic stenosis is present. Aortic valve  area, by VTI measures 1.96 cm. Aortic valve mean gradient measures 6.7 mmHg. Aortic valve peak gradient measures  12.5 mmHg. Aortic valve Vmax measures 1.77 m/s.   6. The inferior vena cava is normal in size with greater than 50% respiratory variability, suggesting RA pressure of 3 mmHg.   Laboratory Data:  High Sensitivity Troponin:   Recent Labs  Lab 11/25/22 1533 11/25/22 1730 11/25/22 2322 11/26/22 0619  TROPONINIHS 138* 140* 279* 233*     Chemistry Recent Labs  Lab 11/25/22 1533 11/26/22 0619  NA 135 135  K 3.9 3.9  CL 103 103  CO2 23 22  GLUCOSE 316* 281*  BUN 10 9  CREATININE 0.88 0.67  CALCIUM 9.5 9.1  MG  --  1.9  GFRNONAA >60 >60  ANIONGAP 9 10    No results for input(s): "PROT", "ALBUMIN", "AST", "ALT", "ALKPHOS", "BILITOT" in the last 168 hours. Lipids  Recent Labs  Lab 11/26/22 0619  CHOL  102  TRIG 122  HDL 36*  LDLCALC 42  CHOLHDL 2.8    Hematology Recent Labs  Lab 11/25/22 1533 11/26/22 0619  WBC 14.3* 12.4*  RBC 2.47* 2.71*  HGB 7.9* 8.5*  HCT 25.2* 26.9*  MCV 102.0* 99.3  MCH 32.0 31.4  MCHC 31.3 31.6  RDW 18.0* 18.2*  PLT 439* 417*   Thyroid No results for input(s): "TSH", "FREET4" in the last 168 hours.  BNPNo results for input(s): "BNP", "PROBNP" in the last 168 hours.  DDimer No results for input(s): "DDIMER" in the last 168 hours.   Radiology/Studies:  CT Angio Chest PE W and/or Wo Contrast  Result Date: 11/25/2022 CLINICAL DATA:  Chest pain.  Concern for pulmonary edema. EXAM: CT ANGIOGRAPHY CHEST WITH CONTRAST TECHNIQUE: Multidetector CT imaging of the chest was performed using the standard protocol during bolus administration of intravenous contrast. Multiplanar CT image reconstructions and  MIPs were obtained to evaluate the vascular anatomy. RADIATION DOSE REDUCTION: This exam was performed according to the departmental dose-optimization program which includes automated exposure control, adjustment of the mA and/or kV according to patient size and/or use of iterative reconstruction technique. CONTRAST:  90mL OMNIPAQUE IOHEXOL 300 MG/ML  SOLN COMPARISON:  Chest radiograph dated 11/25/2022. CT dated 12/17/2021. FINDINGS: Evaluation of this exam is limited due to respiratory motion. Cardiovascular: There is no cardiomegaly or pericardial effusion. Two vessel coronary vascular calcification. Mild atherosclerotic calcification of the thoracic aorta. No aneurysmal dilatation or dissection. Evaluation of the pulmonary arteries is limited due to respiratory motion. No pulmonary artery embolus identified. Mediastinum/Nodes: No hilar or mediastinal adenopathy. The esophagus and the thyroid gland are grossly unremarkable. No mediastinal fluid collection. Lungs/Pleura: The lungs are clear. There is no pleural effusion or pneumothorax. The central airways are patent.  Upper Abdomen: No acute abnormality. Musculoskeletal: No acute osseous pathology. Review of the MIP images confirms the above findings. IMPRESSION: 1. No acute intrathoracic pathology. No CT evidence of pulmonary artery embolus. 2.  Aortic Atherosclerosis (ICD10-I70.0). Electronically Signed   By: Elgie Collard M.D.   On: 11/25/2022 20:28   DG Chest 2 View  Result Date: 11/25/2022 CLINICAL DATA:  Chest pain EXAM: CHEST - 2 VIEW COMPARISON:  X-ray 07/16/2014 FINDINGS: The heart size and mediastinal contours are within normal limits. No consolidation, pneumothorax or effusion. No edema. Normal cardiopericardial silhouette. Degenerative changes of the spine. IMPRESSION: No acute cardiopulmonary disease. Electronically Signed   By: Karen Kays M.D.   On: 11/25/2022 17:13     Assessment and Plan:   Non-STEMI - Patient has been having consistent exertional chest pain with mild exertion for the last 6 days.  The worst episode was last p.m. and the reason she came to the hospital. - mult CRFs - She has elevation in her cardiac enzymes, as well as two-vessel coronary calcifications on CT - Cardiac catheterization was discussed with the patient fully. The patient understands that risks include but are not limited to stroke (1 in 1000), death (1 in 1000), kidney failure [usually temporary] (1 in 500), bleeding (1 in 200), allergic reaction [possibly serious] (1 in 200).  The patient understands and is willing to proceed.   -Because of the anemia, we will do a diagnostic cath only today and staged PCI for next week  2. Anemia - pt states she has had very heavy vaginal bleeding since last September.  She is postmenopausal. -On 11/22/2022, she was put on megestrol. -She states this has been a godsend, the bleeding has reduced to spotting in just a few days. -Iron levels are within normal limits, she was transfused 1 unit of packed cells -Hemoglobin improved from 7.9->8.5 -Management per IM, follow H&H  daily  3.  Diabetes -She takes insulin, and Mounjaro for her diabetes.  She has a freestyle continuous glucose monitor. -Her A1c this admission is 7.5 -Management per IM  4.  Hyperlipidemia: -She takes Lipitor 80 mg daily. -Lipid profile done today is above, HDL is 36 and LDL is 42 -Continue statin  5.  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 -She is not on any medication for this -This and other issues per IM  Risk Assessment/Risk Scores:     TIMI Risk Score for Unstable Angina or Non-ST Elevation MI:   The patient's TIMI risk score is 3, which indicates a 13% risk of all cause  mortality, new or recurrent myocardial infarction or need for urgent revascularization in the next 14 days.   For questions or updates, please contact [facility] Please consult [email or web address] for contact info under    Signed, Theodore Demark, PA-C  11/26/2022 11:50 AM  I have personally seen and examined this patient. I agree with the assessment and plan as outlined above. 62 yo female with history of DM, HTN, HLD, tobacco abuse and obesity with recent finding of anemia admitted with hypertensive urgency and found to have elevated troponin. She describes chest pain with exertion at home over the past week. EKG with sinus tachycardia with non-specific ST abnormalities. Troponin 138-->279-->233. No chest pain currently. Of note, she is found to be anemic on admission and reports heavy menstrual bleeding. She has been seen by Gyn and was started on Megace with improvement in vaginal bleeding. She was given 1 unit of pRBCs last night.   Labs reviewed. EKG personally reviewed and noted above. My exam: CV:RRR, no murmurs. Lungs: clear bilaterally Ext: no edema BP is now controlled.  Plan: Chest pain and elevated troponin in setting of hypertensive urgency (NSTEMI): Her elevated troponin may be due to demand  ischemia but high pre-test probability for CAD. (Risk factors obesity, age, HTN, HLD, tobacco abuse). I think a diagnostic cardiac catheterization is indicated to exclude obstructive CAD. She is anemic but reports heavy menses. No other active bleeding. Given her anemia, I would recommend that we proceed with Diagnostic only cardiac cath today without PCI if this indicated.   Will need to address long term safety of Megace in regards to possible hyper-coagulable state while taking this medication.    I have reviewed the risks, indications, and alternatives to cardiac catheterization, possible angioplasty, and stenting with the patient. Risks include but are not limited to bleeding, infection, vascular injury, stroke, myocardial infection, arrhythmia, kidney injury, radiation-related injury in the case of prolonged fluoroscopy use, emergency cardiac surgery, and death. The patient understands the risks of serious complication is 1-2 in 1000 with diagnostic cardiac cath and 1-2% or less with angioplasty/stenting.  Verne Carrow, MD, [facility] 11/26/2022  12:01 PM

## 2022-11-26 NOTE — Inpatient Diabetes Management (Signed)
Inpatient Diabetes Program Recommendations  AACE/ADA: New Consensus Statement on Inpatient Glycemic Control (2015)  Target Ranges:  Prepandial:   less than 140 mg/dL      Peak postprandial:   less than 180 mg/dL (1-2 hours)      Critically ill patients:  140 - 180 mg/dL   Lab Results  Component Value Date   GLUCAP 258 (H) 11/26/2022   HGBA1C 7.5 (H) 11/26/2022    Review of Glycemic Control  Latest Reference Range & Units 11/26/22 06:19  Glucose 70 - 99 mg/dL 213 (H)  (H): Data is abnormally high Diabetes history: Type 2 DM Outpatient Diabetes medications: U-500 100 units TID, Mounjaro 10 mg qwk Current orders for Inpatient glycemic control: Levemir 30 units BID  Inpatient Diabetes Program Recommendations:     Consider adding Novolog 0-15 units Q4H while NPO then to TID & HS once diet advances.  Thanks, Lujean Rave, MSN, RNC-OB Diabetes Coordinator [phone] (8a-5p)

## 2022-11-26 NOTE — Progress Notes (Signed)
Mobility Specialist Progress Note:   11/26/22 1200  Mobility  Activity Ambulated with assistance in hallway  Level of Assistance Contact guard assist, steadying assist  Assistive Device None  Distance Ambulated (ft) 250 ft  Activity Response Tolerated well  Mobility Referral Yes  $Mobility charge 1 Mobility  Mobility Specialist Start Time (ACUTE ONLY) 1207  Mobility Specialist Stop Time (ACUTE ONLY) 1216  Mobility Specialist Time Calculation (min) (ACUTE ONLY) 9 min    Pre Mobility: 88 HR During Mobility: 97 HR Post Mobility:  89 HR  Received pt in bed having no complaints and agreeable to mobility. Pt was asymptomatic throughout ambulation and returned to room w/o fault. Left in bed w/ call bell in reach and all needs met.    D'Vante Earlene Plater Mobility Specialist Please contact via Special educational needs teacher or Rehab office at [phone]

## 2022-11-26 NOTE — H&P (Addendum)
History and Physical    Amare Dinallo ZOX:[id number] DOB: 31-Mar-1960 DOA: 11/25/2022  PCP: [place], Betty G, MD   Patient coming from: Clinic   Chief Complaint:  Chief Complaint  Patient presents with   Chest Pain    HPI:  Musette Simental is a 62 y.o. female with hx of hypertension, hyperlipidemia, diabetes, former smoker, who presents with chest pain.  Referred by PCP due to chest pain and vital sign changes with severe range hypertension systolic in 200s, tachycardia in the 130s. Onset of symptoms 4 days ago on Sunday.  Reports that she developed new onset of exertional chest pain when going up and down stairs.  Pain starts in the mid upper back and radiates around to her neck and up to her jaw and into her central chest.  Severe, causing her to yell out in pain.  Resolves with rest.  Has never taken nitroglycerin.  Episodes lasting for approximately 15 minutes.  No history of prior MI or other vascular disease.  Does take aspirin 81 mg daily.  Other history notable for heavy menstrual bleeding and recently started on Megace on Monday.  Bleeding has since stopped.  Denies history of uncontrolled blood pressure in the past.  No history of thrombosis.   Review of Systems:  ROS complete and negative except as marked above   No Known Allergies  Prior to Admission medications   Medication Sig Start Date End Date Taking? Authorizing Provider  acetaminophen (TYLENOL) 500 MG tablet Take 1,000 mg by mouth every 6 (six) hours as needed for mild pain, moderate pain, fever or headache.   Yes [provider]  aspirin 81 MG EC tablet Take 81 mg by mouth daily.   Yes [provider]  atorvastatin (LIPITOR) 80 MG tablet Take 1 tablet (80 mg total) by mouth daily. Due for physical. 10/01/22  Yes [place], Betty G, MD  calcium carbonate (OSCAL) 1500 (600 Ca) MG TABS tablet Take 1,200 mg of elemental calcium by mouth daily with breakfast.   Yes [provider]   ezetimibe (ZETIA) 10 MG tablet Take 1 tablet (10 mg total) by mouth daily. 09/20/22  Yes Carlus Pavlov, MD  ferrous sulfate 325 (65 FE) MG tablet Take 325 mg by mouth daily with breakfast.   Yes [provider]  Fexofenadine HCl (ALLEGRA PO) Take 120 mg by mouth daily.   Yes [provider]  insulin regular human CONCENTRATED (HUMULIN R U-500 KWIKPEN) 500 UNIT/ML KwikPen Inject 80-100 Units into the skin 3 (three) times daily before meals. Patient taking differently: Inject 100 Units into the skin 3 (three) times daily before meals. 09/14/22  Yes Carlus Pavlov, MD  lisinopril-hydrochlorothiazide (ZESTORETIC) 20-12.5 MG tablet TAKE 1 TABLET BY MOUTH EVERY DAY 05/24/22  Yes [place], Betty G, MD  Loratadine (CLARITIN PO) Take 10 mg by mouth daily.   Yes [provider]  magnesium oxide (MAG-OX) 400 MG tablet Take 400 mg by mouth daily.   Yes [provider]  megestrol (MEGACE) 40 MG tablet Take 40 mg by mouth daily. TAKE ONE TABLET BY MOUTH TWO TIMES A DAY FOR 3 DAYS AND THEN 1 TABLET DAILY FOR 15 DAYS 11/22/22  Yes [provider]  Multiple Vitamin (MULTIVITAMIN WITH MINERALS) TABS tablet Take 1 tablet by mouth daily.   Yes [provider]  Potassium 99 MG TABS Take 1 tablet by mouth daily.   Yes [provider]  tirzepatide Greggory Keen) 10 MG/0.5ML Pen Inject 10 mg into the skin  once a week. Patient taking differently: Inject 10 mg into the skin once a week. Thursday 09/14/22  Yes Carlus Pavlov, MD  zinc gluconate 50 MG tablet Take 50 mg by mouth daily.   Yes [provider]  Continuous Glucose Sensor (FREESTYLE LIBRE 3 SENSOR) MISC Place 1 sensor on the skin every 14 days. Use to check glucose continuously 09/14/22   Carlus Pavlov, MD  Insulin Pen Needle 32G X 4 MM MISC Use 4x a day 05/18/21   Carlus Pavlov, MD    Past Medical History:  Diagnosis Date   CTS (carpal tunnel syndrome)    DM (diabetes mellitus)  (HCC)    Dyslipidemia    Heart murmur    had ECHO 01-2021   HTN (hypertension)    Hypercholesterolemia    Seasonal allergies    Smoker    Warts, genital     Past Surgical History:  Procedure Laterality Date   COLONOSCOPY     CYST EXCISION Right 03/26/2021   Procedure: RIGHT AXILLARY SEBACEOUS CYST EXCISION;  Surgeon: Emelia Loron, MD;  Location:  SURGERY CENTER;  Service: General;  Laterality: Right;  LOCAL   DILATION AND CURETTAGE OF UTERUS     WISDOM TOOTH EXTRACTION       reports that she quit smoking about 15 years ago. Her smoking use included cigarettes. She started smoking about 41 years ago. She has a 26 pack-year smoking history. She has never used smokeless tobacco. She reports current alcohol use. She reports that she does not use drugs.  Family History  Problem Relation Age of Onset   Breast cancer Mother    Heart disease Mother    Cancer Mother        Breast   Coronary artery disease Father        starting in his 64s. A bypass and mitral infarction   Heart disease Father        Coronary Disease, bypass and mitral infarction   Colon cancer Paternal Grandmother      Physical Exam: Vitals:   11/26/22 0112 11/26/22 0335 11/26/22 0402 11/26/22 0432  BP: (!) 135/57 (!) 132/49 (!) 150/58   Pulse: (!) 102 (!) 102 100   Resp: (!) 24 (!) 22 20   Temp: 98.8 F (37.1 C) 98.3 F (36.8 C) 98.6 F (37 C)   TempSrc: Oral Axillary Oral   SpO2: 96% 95% 97%   Weight:    108.8 kg  Height:        Gen: Awake, alert, NAD   CV: Regular, tachycardic, normal S1, S2, no murmurs  Resp: Normal WOB, CTAB  Abd: Flat, normoactive, nontender MSK: Symmetric, 2+ pitting edema to the knee  skin: No rashes or lesions to exposed skin  Neuro: Alert and interactive  Psych: euthymic, appropriate    Data review:   Labs reviewed, notable for:   Trop 138 -> 140 -> 279 Hemoglobin 7.9, macrocytic  Micro:  No results found for this or any previous visit.  Imaging  reviewed:  CT Angio Chest PE W and/or Wo Contrast  Result Date: 11/25/2022 CLINICAL DATA:  Chest pain.  Concern for pulmonary edema. EXAM: CT ANGIOGRAPHY CHEST WITH CONTRAST TECHNIQUE: Multidetector CT imaging of the chest was performed using the standard protocol during bolus administration of intravenous contrast. Multiplanar CT image reconstructions and MIPs were obtained to evaluate the vascular anatomy. RADIATION DOSE REDUCTION: This exam was performed according to the departmental dose-optimization program which includes automated exposure control, adjustment of the mA  and/or kV according to patient size and/or use of iterative reconstruction technique. CONTRAST:  90mL OMNIPAQUE IOHEXOL 300 MG/ML  SOLN COMPARISON:  Chest radiograph dated 11/25/2022. CT dated 12/17/2021. FINDINGS: Evaluation of this exam is limited due to respiratory motion. Cardiovascular: There is no cardiomegaly or pericardial effusion. Two vessel coronary vascular calcification. Mild atherosclerotic calcification of the thoracic aorta. No aneurysmal dilatation or dissection. Evaluation of the pulmonary arteries is limited due to respiratory motion. No pulmonary artery embolus identified. Mediastinum/Nodes: No hilar or mediastinal adenopathy. The esophagus and the thyroid gland are grossly unremarkable. No mediastinal fluid collection. Lungs/Pleura: The lungs are clear. There is no pleural effusion or pneumothorax. The central airways are patent. Upper Abdomen: No acute abnormality. Musculoskeletal: No acute osseous pathology. Review of the MIP images confirms the above findings. IMPRESSION: 1. No acute intrathoracic pathology. No CT evidence of pulmonary artery embolus. 2.  Aortic Atherosclerosis (ICD10-I70.0). Electronically Signed   By: Elgie Collard M.D.   On: 11/25/2022 20:28   DG Chest 2 View  Result Date: 11/25/2022 CLINICAL DATA:  Chest pain EXAM: CHEST - 2 VIEW COMPARISON:  X-ray 07/16/2014 FINDINGS: The heart size and  mediastinal contours are within normal limits. No consolidation, pneumothorax or effusion. No edema. Normal cardiopericardial silhouette. Degenerative changes of the spine. IMPRESSION: No acute cardiopulmonary disease. Electronically Signed   By: Karen Kays M.D.   On: 11/25/2022 17:13    EKG:  Sinus rhythm, ST depression inferolateral leads, aVR elevation  ED Course:  Treated with aspirin load 324 mg x 1.  Cardiology was consulted by EDP and discussed case.  Recommended against heparin drip, and plan for cardiology consultation in the morning.   Assessment/Plan:  62 y.o. female with hx hypertension, hyperlipidemia, diabetes, former smoker, who presents with chest pain.  Admitted with NSTEMI  NSTEMI History acute onset of exertional chest pain relieved by rest.  Multiple vascular risk factors including hypertension, hyperlipidemia, diabetes, former smoking.  Unknown if this is true type I event versus demand.  Has had anemia as low as 7.9 on admission related to HMB.  Also with severe range hypertension in the 200s, question of possibly related to Megace.  Notably Megace was started the day after her symptoms actually began. -Cardiology consulted by EDP, to consult in the morning.  Recommended against heparin drip as feel type I event less likely. -Status post aspirin 324 mg load, continue 81 mg daily -If recurrent episode of chest pain or continued elevation in troponin would start on heparin drip.  There is no active menstrual bleeding. -Obtain TTE eval for WM abnormality, heart failure with LE edema -Continue to trend troponin to peak -NTG paste as needed for chest pain -Transfusion threshold hemoglobin greater than 8 in setting of ACS -Check lipid panel, A1c -N.p.o. in case of cath  Anemia, acute in setting of HMB History recent heavy vaginal bleeding and started on Megace twice daily.  Vaginal bleeding is since stopped.  Baseline hemoglobin is within normal limits.  On admission 7.9,  macrocytic. -Check iron panel, folate, B12 -Transfusion threshold hemoglobin greater than 8 in setting of ACS.  Ordered for 1 unit RBC  Severe range hypertension, improved BP at PCP office with systolic in the 200s.  Question if may be adverse effect of Megace.  BP improved at time of ED treatment/admission.  -Continue home lisinopril-HCTZ  Chronic medical problems: Hyperlipidemia: Continue atorvastatin 80 mg Diabetes, with hyperglycemia: Home regimen is Humalog R U500 100 units 3 times daily with meals, Mounjaro.  Considering n.p.o. status, started on reduced dose NPH initially 25 units twice daily with sliding scale for resistant   Body mass index is 38.71 kg/m.  Obesity class II affecting medical care per above  DVT prophylaxis:  SQ Heparin Code Status:  Full Code Diet:  Diet Orders (From admission, onward)     Start     Ordered   11/25/22 2306  Diet Heart Room service appropriate? Yes; Fluid consistency: Thin  Diet effective now       Question Answer Comment  Room service appropriate? Yes   Fluid consistency: Thin      11/25/22 2307           Family Communication: Yes discussed with husband at bedside Consults: Cardiology Admission status:   Inpatient, Telemetry bed  Severity of Illness: The appropriate patient status for this patient is INPATIENT. Inpatient status is judged to be reasonable and necessary in order to provide the required intensity of service to ensure the patient's safety. The patient's presenting symptoms, physical exam findings, and initial radiographic and laboratory data in the context of their chronic comorbidities is felt to place them at high risk for further clinical deterioration. Furthermore, it is not anticipated that the patient will be medically stable for discharge from the hospital within 2 midnights of admission.   * I certify that at the point of admission it is my clinical judgment that the patient will require inpatient hospital care  spanning beyond 2 midnights from the point of admission due to high intensity of service, high risk for further deterioration and high frequency of surveillance required.*   Dolly Rias, MD [facility]  How to contact the [facility] Attending or Consulting provider 7A - 7P or covering provider during after hours 7P -7A, for this patient.  Check the care team in [facility] and look for a) attending/consulting TR[facility] provider listed and b) the [facility] team listed Log into [email or web address] and use [facility] universal password to access. If you do not have the password, please contact the hospital operator. Locate the [facility] provider you are looking for under [facility] and page to a number that you can be directly reached. If you still have difficulty reaching the provider, please page the [facility] (Director on Call) for the Hospitalists listed on amion for assistance.  11/26/2022, 4:42 AM

## 2022-11-26 NOTE — Interval H&P Note (Signed)
Cath Lab Visit (complete for each Cath Lab visit)  Clinical Evaluation Leading to the Procedure:   ACS: Yes.    Non-ACS:  n/a     History and Physical Interval Note:  11/26/2022 3:19 PM  Catherine Moon  has presented today for surgery, with the diagnosis of nstemi.  The various methods of treatment have been discussed with the patient and family. After consideration of risks, benefits and other options for treatment, the patient has consented to  Procedure(s): LEFT HEART CATH AND CORONARY ANGIOGRAPHY (N/A) as a surgical intervention.  The patient's history has been reviewed, patient examined, no change in status, stable for surgery.  I have reviewed the patient's chart and labs.  Questions were answered to the patient's satisfaction.     Lorine Bears

## 2022-11-26 NOTE — Care Management (Signed)
  Transition of Care [facility]) Screening Note   Patient Details  Name: Karrington Turbyfill Date of Birth: 1960-11-22   Transition of Care [facility]) CM/SW Contact:    Lockie Pares, RN Phone Number: 11/26/2022, 11:46 AM    Transition of Care Department [facility]) has reviewed patient and no TOC needs have been identified at this time. We will continue to monitor patient advancement through interdisciplinary progression rounds. If new patient transition needs arise, please place a TOC consult.

## 2022-11-27 ENCOUNTER — Other Ambulatory Visit (HOSPITAL_COMMUNITY): Payer: BC Managed Care – PPO

## 2022-11-27 DIAGNOSIS — I200 Unstable angina: Secondary | ICD-10-CM | POA: Diagnosis not present

## 2022-11-27 LAB — TYPE AND SCREEN
ABO/RH(D): O POS
Antibody Screen: NEGATIVE
Unit division: 0

## 2022-11-27 LAB — GLUCOSE, CAPILLARY
Glucose-Capillary: 228 mg/dL — ABNORMAL HIGH (ref 70–99)
Glucose-Capillary: 224 mg/dL — ABNORMAL HIGH (ref 70–99)
Glucose-Capillary: 271 mg/dL — ABNORMAL HIGH (ref 70–99)
Glucose-Capillary: 344 mg/dL — ABNORMAL HIGH (ref 70–99)

## 2022-11-27 LAB — BPAM RBC
Blood Product Expiration Date: 202412142359
ISSUE DATE / TIME: 202411220051
Unit Type and Rh: 5100

## 2022-11-27 NOTE — Progress Notes (Signed)
CARDIAC REHAB PHASE I   Pre-op education completed with pt. Discussed IS use, hospital stay, discharge planning, sternal precautions/move in the tube, mobility, and OHS booklet. Pt asked several appropriate questions, all answered and pt left EOB with call bell.  [phone]  Jonna Coup, MS, ACSM-CEP 11/27/2022 12:01 PM

## 2022-11-27 NOTE — Progress Notes (Signed)
PROGRESS NOTE    Catherine Moon  AVW:[id number] DOB: 1960/10/11 DOA: 11/25/2022 PCP: [place], Betty G, MD   Brief Narrative:  Catherine Moon is an 62 y.o. female past medical history of essential hypertension, hyperlipidemia, diabetes former smoker comes in with chest pain and with severe changes in her blood pressure with a systolic greater than 200 and tachycardic in the 130s, she relates that her symptoms started about 4 days prior to admission with new exertional chest pain, resolved with rest episodes lasting about 15 minutes.  Assessment & Plan:   Principal Problem:   Unstable angina (HCC) Active Problems:   Hyperlipidemia associated with type 2 diabetes mellitus (HCC)   Essential hypertension   (HFpEF) heart failure with preserved ejection fraction (HCC)   NSTEMI (non-ST elevated myocardial infarction) (HCC)   Unstable angina/NSTEMI (non-ST elevated myocardial infarction) (HCC) CAD Functional bicuspid aortic valve Multiple risk factor with a classical history of exertional chest pain better with rest. Troponins in the 200.  Lead EKG is unremarkable. Cardiology was consulted recommended against heparin. Started on aspirin, continue nitro as needed, metoprolol Per cardiac catheterization given 70% stenosis of the left main patient has been tentatively scheduled for CABG Tuesday, 11/30/2022   Macrocytic anemia: In the setting of heavy menstrual periods. Continue B12 replacement Goal hemoglobin greater than 8   Hypertensive urgency: Pressure at the PCP was greater than 200. Was recently started on Megace which can cause increase in blood pressure. She was started on her home regimen her blood pressure this morning is significantly improved 128/55 Question if her chest pain was likely due to elevated blood pressure, would likely need ischemic workup.   Hyperlipidemia: Continue statins.   Diabetes mellitus type 2 with hyperglycemia: At home she is on  U-500 as well as Mounjaro.  DVT prophylaxis: heparin injection 5,000 Units Start: 11/26/22 0600 SCDs Start: 11/25/22 2307   Code Status:   Code Status: Full Code  Family Communication: At bedside  Status is: Inpatient  Dispo: The patient is from: Home              Anticipated d/c is to: Home              Anticipated d/c date is: 72+ hours              Patient currently not medically stable for discharge  Consultants:  Cardiology, CT surgery  Procedures:  Left heart cath 11/22 CABG planned 11/26  Antimicrobials:  None  Subjective: No acute issues or events overnight  Objective: Vitals:   11/26/22 1937 11/26/22 1940 11/27/22 0516 11/27/22 0743  BP:  132/62 (!) 141/61   Pulse: 89 88 87   Resp: 18 20 18 16   Temp: 98.1 F (36.7 C) 98.1 F (36.7 C) 98.6 F (37 C) 98.4 F (36.9 C)  TempSrc: Oral Oral Oral Oral  SpO2: 96% 95% 96%   Weight:   106.3 kg   Height:        Intake/Output Summary (Last 24 hours) at 11/27/2022 0838 Last data filed at 11/26/2022 1604 Gross per 24 hour  Intake 0 ml  Output --  Net 0 ml   Filed Weights   11/25/22 2303 11/26/22 0432 11/27/22 0516  Weight: 108.8 kg 108.8 kg 106.3 kg    Examination:  General:  Pleasantly resting in bed, No acute distress. HEENT:  Normocephalic atraumatic.  Sclerae nonicteric, noninjected.  Extraocular movements intact bilaterally. Neck:  Without mass or deformity.  Trachea is midline. Lungs:  Clear  to auscultate bilaterally without rhonchi, wheeze, or rales. Heart:  Regular rate and rhythm.  Without murmurs, rubs, or gallops. Abdomen:  Soft, nontender, nondistended.  Without guarding or rebound. Extremities: Without cyanosis, clubbing, edema, or obvious deformity. Skin:  Warm and dry, no erythema.  Data Reviewed: I have personally reviewed following labs and imaging studies  CBC: Recent Labs  Lab 11/25/22 1533 11/26/22 0619  WBC 14.3* 12.4*  HGB 7.9* 8.5*  HCT 25.2* 26.9*  MCV 102.0* 99.3   PLT 439* 417*   Basic Metabolic Panel: Recent Labs  Lab 11/25/22 1533 11/26/22 0619  NA 135 135  K 3.9 3.9  CL 103 103  CO2 23 22  GLUCOSE 316* 281*  BUN 10 9  CREATININE 0.88 0.67  CALCIUM 9.5 9.1  MG  --  1.9  PHOS  --  4.5   GFR: Estimated Creatinine Clearance: 91 mL/min (by C-G formula based on SCr of 0.67 mg/dL). Liver Function Tests: No results for input(s): "AST", "ALT", "ALKPHOS", "BILITOT", "PROT", "ALBUMIN" in the last 168 hours. No results for input(s): "LIPASE", "AMYLASE" in the last 168 hours. No results for input(s): "AMMONIA" in the last 168 hours. Coagulation Profile: Recent Labs  Lab 11/26/22 0619  INR 1.0   Cardiac Enzymes: No results for input(s): "CKTOTAL", "CKMB", "CKMBINDEX", "TROPONINI" in the last 168 hours. BNP (last 3 results) No results for input(s): "PROBNP" in the last 8760 hours. HbA1C: Recent Labs    11/26/22 0619  HGBA1C 7.5*   CBG: Recent Labs  Lab 11/26/22 1026 11/26/22 1241 11/26/22 1640 11/26/22 2118 11/27/22 0739  GLUCAP 228* 257* 208* 301* 228*   Lipid Profile: Recent Labs    11/26/22 0619  CHOL 102  HDL 36*  LDLCALC 42  TRIG 962  CHOLHDL 2.8   Thyroid Function Tests: No results for input(s): "TSH", "T4TOTAL", "FREET4", "T3FREE", "THYROIDAB" in the last 72 hours. Anemia Panel: Recent Labs    11/25/22 2322 11/25/22 2328  VITAMINB12  --  267  FOLATE >40.0  --   FERRITIN 33  --   TIBC 414  --   IRON 60  --    Sepsis Labs: No results for input(s): "PROCALCITON", "LATICACIDVEN" in the last 168 hours.  No results found for this or any previous visit (from the past 240 hour(s)).       Radiology Studies: ECHOCARDIOGRAM COMPLETE  Result Date: 11/26/2022    ECHOCARDIOGRAM REPORT   Patient Name:   Catherine Moon Date of Exam: 11/26/2022 Medical Rec #:  [id number]               Height:       66.0 in Accession #:    [id number]              Weight:       239.9 lb Date of Birth:  1960-05-13                BSA:          2.160 m Patient Age:    62 years                BP:           128/55 mmHg Patient Gender: F                       HR:           85 bpm. Exam Location:  Inpatient Procedure: 2D Echo, Color Doppler and Cardiac Doppler Indications:  NSTEMI  History:        Patient has prior history of Echocardiogram examinations, most                 recent 01/06/2021. CHF, Acute MI; Risk Factors:Hypertension,                 Dyslipidemia and Diabetes.  Sonographer:    Milbert Coulter Referring Phys: [id number] JONATHAN SEGARS IMPRESSIONS  1. Left ventricular ejection fraction, by estimation, is 55 to 60%. The left ventricle has normal function. The left ventricle has no regional wall motion abnormalities. There is moderate asymmetric left ventricular hypertrophy of the basal-septal segment. Left ventricular diastolic parameters were normal.  2. Right ventricular systolic function is normal. The right ventricular size is normal.  3. The mitral valve is normal in structure. Trivial mitral valve regurgitation. No evidence of mitral stenosis. Moderate to severe mitral annular calcification.  4. Functionally bicuspid aortic valve with calcification/partial fusion of LCC and NCC. The aortic valve is abnormal. There is moderate calcification of the aortic valve. There is moderate thickening of the aortic valve. Aortic valve regurgitation is trivial. Aortic valve sclerosis/calcification is present, without any evidence of aortic stenosis. Comparison(s): No significant change from prior study. Conclusion(s)/Recommendation(s): Otherwise normal echocardiogram, with minor abnormalities described in the report. FINDINGS  Left Ventricle: Left ventricular ejection fraction, by estimation, is 55 to 60%. The left ventricle has normal function. The left ventricle has no regional wall motion abnormalities. The left ventricular internal cavity size was normal in size. There is  moderate asymmetric left ventricular hypertrophy of the  basal-septal segment. Left ventricular diastolic parameters were normal. Right Ventricle: The right ventricular size is normal. No increase in right ventricular wall thickness. Right ventricular systolic function is normal. Left Atrium: Left atrial size was normal in size. Right Atrium: Right atrial size was normal in size. Pericardium: There is no evidence of pericardial effusion. Mitral Valve: The mitral valve is normal in structure. Moderate to severe mitral annular calcification. Trivial mitral valve regurgitation. No evidence of mitral valve stenosis. Tricuspid Valve: The tricuspid valve is normal in structure. Tricuspid valve regurgitation is trivial. No evidence of tricuspid stenosis. Aortic Valve: Functionally bicuspid aortic valve with calcification/partial fusion of LCC and NCC. The aortic valve is abnormal. There is moderate calcification of the aortic valve. There is moderate thickening of the aortic valve. Aortic valve regurgitation is trivial. Aortic valve sclerosis/calcification is present, without any evidence of aortic stenosis. Aortic valve mean gradient measures 8.0 mmHg. Aortic valve peak gradient measures 14.0 mmHg. Aortic valve area, by VTI measures 2.15 cm. Pulmonic Valve: The pulmonic valve was grossly normal. Pulmonic valve regurgitation is not visualized. No evidence of pulmonic stenosis. Aorta: The aortic root, ascending aorta and aortic arch are all structurally normal, with no evidence of dilitation or obstruction. Venous: The inferior vena cava was not well visualized. IAS/Shunts: The atrial septum is grossly normal.  LEFT VENTRICLE PLAX 2D LVIDd:         4.20 cm   Diastology LVIDs:         2.90 cm   LV e' medial:    8.05 cm/s LV PW:         1.30 cm   LV E/e' medial:  14.0 LV IVS:        1.40 cm   LV e' lateral:   8.16 cm/s LVOT diam:     2.00 cm   LV E/e' lateral: 13.8 LV SV:  76 LV SV Index:   35 LVOT Area:     3.14 cm  RIGHT VENTRICLE RV Basal diam:  3.10 cm RV Mid diam:     3.00 cm RV S prime:     12.60 cm/s TAPSE (M-mode): 2.0 cm LEFT ATRIUM             Index        RIGHT ATRIUM           Index LA diam:        4.40 cm 2.04 cm/m   RA Area:     14.80 cm LA Vol (A2C):   41.3 ml 19.12 ml/m  RA Volume:   39.00 ml  18.05 ml/m LA Vol (A4C):   37.2 ml 17.22 ml/m LA Biplane Vol: 39.6 ml 18.33 ml/m  AORTIC VALVE AV Area (Vmax):    2.02 cm AV Area (Vmean):   2.02 cm AV Area (VTI):     2.15 cm AV Vmax:           187.00 cm/s AV Vmean:          128.000 cm/s AV VTI:            0.355 m AV Peak Grad:      14.0 mmHg AV Mean Grad:      8.0 mmHg LVOT Vmax:         120.00 cm/s LVOT Vmean:        82.300 cm/s LVOT VTI:          0.243 m LVOT/AV VTI ratio: 0.68  AORTA Ao Root diam: 2.90 cm Ao Asc diam:  3.00 cm MITRAL VALVE MV Area (PHT): 3.74 cm     SHUNTS MV Decel Time: 203 msec     Systemic VTI:  0.24 m MV E velocity: 113.00 cm/s  Systemic Diam: 2.00 cm MV A velocity: 111.00 cm/s MV E/A ratio:  1.02 Jodelle Red MD Electronically signed by Jodelle Red MD Signature Date/Time: 11/26/2022/6:15:34 PM    Final    CARDIAC CATHETERIZATION  Result Date: 11/26/2022   Prox RCA lesion is 60% stenosed.   Prox RCA to Mid RCA lesion is 90% stenosed.   Prox Cx to Mid Cx lesion is 30% stenosed.   Prox LAD to Mid LAD lesion is 30% stenosed.   Mid LAD lesion is 20% stenosed.   Ost LM lesion is 40% stenosed.   Mid LM to Dist LM lesion is 70% stenosed.   LPAV lesion is 60% stenosed.   LV end diastolic pressure is severely elevated.   The left ventricular ejection fraction is 50-55% by visual estimate. 1.  Left dominant coronary arteries with significant eccentric distal left main stenosis.  The RCA is also very diseased but nondominant. 2.  Low normal LV systolic function with an EF of 50 to 55%.  Severely elevated left ventricular end-diastolic pressure at 33 mmHg. Recommendations: Recommend evaluation for CABG given distal left main involvement and diabetic status. LVEDP severely elevated  but the patient received aggressive precath hydration.    CT Angio Chest PE W and/or Wo Contrast  Result Date: 11/25/2022 CLINICAL DATA:  Chest pain.  Concern for pulmonary edema. EXAM: CT ANGIOGRAPHY CHEST WITH CONTRAST TECHNIQUE: Multidetector CT imaging of the chest was performed using the standard protocol during bolus administration of intravenous contrast. Multiplanar CT image reconstructions and MIPs were obtained to evaluate the vascular anatomy. RADIATION DOSE REDUCTION: This exam was performed according to the departmental dose-optimization program which includes automated exposure control,  adjustment of the mA and/or kV according to patient size and/or use of iterative reconstruction technique. CONTRAST:  90mL OMNIPAQUE IOHEXOL 300 MG/ML  SOLN COMPARISON:  Chest radiograph dated 11/25/2022. CT dated 12/17/2021. FINDINGS: Evaluation of this exam is limited due to respiratory motion. Cardiovascular: There is no cardiomegaly or pericardial effusion. Two vessel coronary vascular calcification. Mild atherosclerotic calcification of the thoracic aorta. No aneurysmal dilatation or dissection. Evaluation of the pulmonary arteries is limited due to respiratory motion. No pulmonary artery embolus identified. Mediastinum/Nodes: No hilar or mediastinal adenopathy. The esophagus and the thyroid gland are grossly unremarkable. No mediastinal fluid collection. Lungs/Pleura: The lungs are clear. There is no pleural effusion or pneumothorax. The central airways are patent. Upper Abdomen: No acute abnormality. Musculoskeletal: No acute osseous pathology. Review of the MIP images confirms the above findings. IMPRESSION: 1. No acute intrathoracic pathology. No CT evidence of pulmonary artery embolus. 2.  Aortic Atherosclerosis (ICD10-I70.0). Electronically Signed   By: Elgie Collard M.D.   On: 11/25/2022 20:28   DG Chest 2 View  Result Date: 11/25/2022 CLINICAL DATA:  Chest pain EXAM: CHEST - 2 VIEW COMPARISON:   X-ray 07/16/2014 FINDINGS: The heart size and mediastinal contours are within normal limits. No consolidation, pneumothorax or effusion. No edema. Normal cardiopericardial silhouette. Degenerative changes of the spine. IMPRESSION: No acute cardiopulmonary disease. Electronically Signed   By: Karen Kays M.D.   On: 11/25/2022 17:13        Scheduled Meds:  aspirin  81 mg Oral Daily   atorvastatin  80 mg Oral Daily   cyanocobalamin  1,000 mcg Intramuscular Daily   ezetimibe  10 mg Oral Daily   ferrous sulfate  325 mg Oral Q breakfast   heparin injection (subcutaneous)  5,000 Units Subcutaneous Q8H   lisinopril  20 mg Oral Daily   And   hydrochlorothiazide  12.5 mg Oral Daily   insulin aspart  0-20 Units Subcutaneous TID WC   insulin aspart  0-5 Units Subcutaneous QHS   insulin aspart  6 Units Subcutaneous TID WC   insulin detemir  50 Units Subcutaneous BID   loratadine  10 mg Oral Daily   metoprolol tartrate  25 mg Oral BID   sodium chloride flush  3 mL Intravenous Q12H   sodium chloride flush  3 mL Intravenous Q12H   Continuous Infusions:  sodium chloride       LOS: 2 days   Time spent:  Azucena Fallen, DO [facility]  If 7PM-7AM, please contact night-coverage [email or web address]  11/27/2022, 8:38 AM

## 2022-11-27 NOTE — Progress Notes (Signed)
   Patient Name: Catherine Moon Date of Encounter: 11/27/2022 [facility] Cardiologist: None   Interval Summary  .    Having some stomach upset, diarrhea, no chest pain  Vital Signs .    Vitals:   11/26/22 1937 11/26/22 1940 11/27/22 0516 11/27/22 0743  BP:  132/62 (!) 141/61 (!) 120/56  Pulse: 89 88 87 82  Resp: 18 20 18 16   Temp: 98.1 F (36.7 C) 98.1 F (36.7 C) 98.6 F (37 C) 98.4 F (36.9 C)  TempSrc: Oral Oral Oral Oral  SpO2: 96% 95% 96% 95%  Weight:   106.3 kg   Height:        Intake/Output Summary (Last 24 hours) at 11/27/2022 1148 Last data filed at 11/26/2022 1604 Gross per 24 hour  Intake 0 ml  Output --  Net 0 ml      11/27/2022    5:16 AM 11/26/2022    4:32 AM 11/25/2022   11:03 PM  Last 3 Weights  Weight (lbs) 234 lb 6.4 oz 239 lb 13.8 oz 239 lb 13.8 oz  Weight (kg) 106.323 kg 108.8 kg 108.8 kg      Telemetry/ECG    No adverse arrhythmias, sinus rhythm- Personally Reviewed  Physical Exam .   GEN: No acute distress.   Neck: No JVD Cardiac: RRR, 2/6 systolic murmur, no rubs, or gallops.  Respiratory: Clear to auscultation bilaterally. GI: Soft, nontender, non-distended  MS: No edema  Assessment & Plan .     62 year old female with diabetes hypertension hyperlipidemia with newly discovered multivessel coronary artery disease awaiting CABG.  Cardiac catheterization took place on Friday, 11/26/2022  Severe multivessel/left main coronary artery disease -Diagnostic Dominance: Left    Holding Mounjaro.  She takes her injections on Thursdays. Plan is tentatively for Dr. Cliffton Asters to perform CABG on Tuesday.  Bicuspid aortic valve - Functional bicuspid aortic valve with mean gradient of 8 mmHg, peak to peak gradient of 14 mmHg, aortic valve area measures at 2.15 by VTI.  No stenosis noted.  No evidence of aortic root enlargement.    For questions or updates, please contact  [facility] Please consult  [email or web address] for contact info under        Signed, Donato Schultz, MD

## 2022-11-28 ENCOUNTER — Inpatient Hospital Stay (HOSPITAL_COMMUNITY): Payer: BC Managed Care – PPO

## 2022-11-28 DIAGNOSIS — Z01810 Encounter for preprocedural cardiovascular examination: Secondary | ICD-10-CM

## 2022-11-28 DIAGNOSIS — I200 Unstable angina: Secondary | ICD-10-CM | POA: Diagnosis not present

## 2022-11-28 LAB — URINALYSIS, ROUTINE W REFLEX MICROSCOPIC
Bilirubin Urine: NEGATIVE
Glucose, UA: 50 mg/dL — AB
Ketones, ur: NEGATIVE mg/dL
Nitrite: NEGATIVE
Protein, ur: NEGATIVE mg/dL
RBC / HPF: >50 RBC/hpf (ref 0–5)
Specific Gravity, Urine: 1.01 (ref 1.005–1.030)
pH: 6 (ref 5.0–8.0)

## 2022-11-28 LAB — SURGICAL PCR SCREEN
MRSA, PCR: NEGATIVE
Staphylococcus aureus: NEGATIVE

## 2022-11-28 LAB — VAS US DOPPLER PRE CABG

## 2022-11-28 LAB — GLUCOSE, CAPILLARY
Glucose-Capillary: 242 mg/dL — ABNORMAL HIGH (ref 70–99)
Glucose-Capillary: 190 mg/dL — ABNORMAL HIGH (ref 70–99)
Glucose-Capillary: 266 mg/dL — ABNORMAL HIGH (ref 70–99)
Glucose-Capillary: 242 mg/dL — ABNORMAL HIGH (ref 70–99)

## 2022-11-28 MED ORDER — INSULIN ASPART 100 UNIT/ML IJ SOLN
20.0000 [IU] | Freq: Three times a day (TID) | INTRAMUSCULAR | Status: DC
  Administered 2022-11-28 – 2022-11-29 (×6): 20 [IU] via SUBCUTANEOUS

## 2022-11-28 NOTE — Progress Notes (Signed)
Mobility Specialist Progress Note:    11/28/22 1451  Mobility  Activity Ambulated with assistance in hallway  Level of Assistance Standby assist, set-up cues, supervision of patient - no hands on  Assistive Device None  Distance Ambulated (ft) 500 ft  Activity Response Tolerated well  Mobility Referral Yes  $Mobility charge 1 Mobility  Mobility Specialist Start Time (ACUTE ONLY) 1428  Mobility Specialist Stop Time (ACUTE ONLY) 1438  Mobility Specialist Time Calculation (min) (ACUTE ONLY) 10 min   Received pt in bed having no complaints and agreeable to mobility. Pt was asymptomatic throughout ambulation and returned to room w/o fault. Left seated EOB w/ call bell in reach and all needs met.   Thompson Grayer Mobility Specialist  Please contact vis Secure Chat or  Rehab Office [phone]

## 2022-11-28 NOTE — Progress Notes (Signed)
   Patient Name: Catherine Moon Date of Encounter: 11/28/2022 [facility] Cardiologist: None   Interval Summary  .    Yesterday was having some diarrhea but no chest pain  Vital Signs .    Vitals:   11/28/22 0447 11/28/22 0612 11/28/22 0613 11/28/22 0850  BP:  (!) 138/57  128/61  Pulse: 71 86 72   Resp:  16 18   Temp:  98.4 F (36.9 C) 98.4 F (36.9 C) 98.5 F (36.9 C)  TempSrc:  Oral Oral Oral  SpO2: 96% 98% 96%   Weight:  105 kg    Height:        Intake/Output Summary (Last 24 hours) at 11/28/2022 1117 Last data filed at 11/28/2022 0854 Gross per 24 hour  Intake 246 ml  Output --  Net 246 ml      11/28/2022    6:12 AM 11/27/2022    5:16 AM 11/26/2022    4:32 AM  Last 3 Weights  Weight (lbs) 231 lb 8 oz 234 lb 6.4 oz 239 lb 13.8 oz  Weight (kg) 105.008 kg 106.323 kg 108.8 kg      Telemetry/ECG    No adverse arrhythmias- Personally Reviewed  Physical Exam .   GEN: No acute distress.   Neck: No JVD Cardiac: RRR, 2/6 systolic murmur, no rubs, or gallops.  Respiratory: Clear to auscultation bilaterally. GI: Soft, nontender, non-distended  MS: No edema  Assessment & Plan .     62 year old with multivessel CAD awaiting CABG on Tuesday Dr. Cliffton Asters  [facility]  Bicuspid aortic valve - Mean gradient 8 mmHg.  No evidence of stenosis.  No evidence of aortic root enlargement. For questions or updates, please contact [facility] Please consult [email or web address] for contact info under        Signed, Donato Schultz, MD

## 2022-11-28 NOTE — Progress Notes (Signed)
VASCULAR LAB    Pre CABG Dopplers have been performed.  See CV proc for preliminary results.   Usama Harkless, RVT 11/28/2022, 11:55 AM

## 2022-11-28 NOTE — Progress Notes (Signed)
PROGRESS NOTE    Catherine Moon  ONG:[id number] DOB: 03-01-1960 DOA: 11/25/2022 PCP: Catherine Moon   Brief Narrative:  Catherine Moon is an 62 y.o. female past medical history of essential hypertension, hyperlipidemia, diabetes former smoker comes in with chest pain and with severe changes in her blood pressure with a systolic greater than 200 and tachycardic in the 130s, she relates that her symptoms started about 4 days prior to admission with new exertional chest pain, resolved with rest episodes lasting about 15 minutes.  Assessment & Plan:   Principal Problem:   Unstable angina (HCC) Active Problems:   Hyperlipidemia associated with type 2 diabetes mellitus (HCC)   Essential hypertension   (HFpEF) heart failure with preserved ejection fraction (HCC)   NSTEMI (non-ST elevated myocardial infarction) (HCC)   Unstable angina/NSTEMI (non-ST elevated myocardial infarction) (HCC) CAD Functional bicuspid aortic valve Multiple risk factor with a classical history of exertional chest pain better with rest. Troponins minimally elevated EKG is unremarkable. Cardiology was consulted recommended against heparin. Started on aspirin, continue nitro as needed, metoprolol Per cardiac catheterization given 70% stenosis of the left main patient has been tentatively scheduled for CABG Tuesday, 11/30/2022   Symptomatic macrocytic anemia: In the setting of heavy post-menopausal bleeding - followed by ObGyn. Continue B12 replacement Goal hemoglobin greater than 8   Hypertensive urgency: Pressure at the PCP was greater than 200. Was recently started on Megace which can cause increase in blood pressure. She was started on her home regimen her blood pressure this morning is significantly improved 128/55 Question if her chest pain was likely due to elevated blood pressure, would likely need ischemic workup.   Hyperlipidemia: Continue statins.   Diabetes mellitus type 2  with hyperglycemia: At home she is on U-500 as well as Mounjaro.  DVT prophylaxis: heparin injection 5,000 Units Start: 11/26/22 0600 SCDs Start: 11/25/22 2307   Code Status:   Code Status: Full Code  Family Communication: At bedside  Status is: Inpatient  Dispo: The patient is from: Home              Anticipated d/c is to: Home              Anticipated d/c date is: 72+ hours              Patient currently not medically stable for discharge  Consultants:  Cardiology, CT surgery  Procedures:  Left heart cath 11/22 CABG planned 11/26  Antimicrobials:  None  Subjective: No acute issues or events overnight  Objective: Vitals:   11/27/22 2312 11/28/22 0447 11/28/22 0612 11/28/22 0613  BP:   (!) 138/57   Pulse: 78 71 86 72  Resp: 20  16 18   Temp: 98.4 F (36.9 C)  98.4 F (36.9 C) 98.4 F (36.9 C)  TempSrc: Oral  Oral Oral  SpO2: 96% 96% 98% 96%  Weight:   105 kg   Height:        Intake/Output Summary (Last 24 hours) at 11/28/2022 0726 Last data filed at 11/28/2022 0600 Gross per 24 hour  Intake 243 ml  Output --  Net 243 ml   Filed Weights   11/26/22 0432 11/27/22 0516 11/28/22 0612  Weight: 108.8 kg 106.3 kg 105 kg    Examination:  General:  Pleasantly resting in bed, No acute distress. HEENT:  Normocephalic atraumatic.  Sclerae nonicteric, noninjected.  Extraocular movements intact bilaterally. Neck:  Without mass or deformity.  Trachea is midline. Lungs:  Clear to  auscultate bilaterally without rhonchi, wheeze, or rales. Heart:  Regular rate and rhythm.  Without murmurs, rubs, or gallops. Abdomen:  Soft, nontender, nondistended.  Without guarding or rebound. Extremities: Without cyanosis, clubbing, edema, or obvious deformity. Skin:  Warm and dry, no erythema.  Data Reviewed: I have personally reviewed following labs and imaging studies  CBC: Recent Labs  Lab 11/25/22 1533 11/26/22 0619  WBC 14.3* 12.4*  HGB 7.9* 8.5*  HCT 25.2* 26.9*  MCV  102.0* 99.3  PLT 439* 417*   Basic Metabolic Panel: Recent Labs  Lab 11/25/22 1533 11/26/22 0619  NA 135 135  K 3.9 3.9  CL 103 103  CO2 23 22  GLUCOSE 316* 281*  BUN 10 9  CREATININE 0.88 0.67  CALCIUM 9.5 9.1  MG  --  1.9  PHOS  --  4.5   GFR: Estimated Creatinine Clearance: 90.5 mL/min (by C-G formula based on SCr of 0.67 mg/dL). Liver Function Tests: No results for input(s): "AST", "ALT", "ALKPHOS", "BILITOT", "PROT", "ALBUMIN" in the last 168 hours. No results for input(s): "LIPASE", "AMYLASE" in the last 168 hours. No results for input(s): "AMMONIA" in the last 168 hours. Coagulation Profile: Recent Labs  Lab 11/26/22 0619  INR 1.0   Cardiac Enzymes: No results for input(s): "CKTOTAL", "CKMB", "CKMBINDEX", "TROPONINI" in the last 168 hours. BNP (last 3 results) No results for input(s): "PROBNP" in the last 8760 hours. HbA1C: Recent Labs    11/26/22 0619  HGBA1C 7.5*   CBG: Recent Labs  Lab 11/26/22 2118 11/27/22 0739 11/27/22 1159 11/27/22 1603 11/27/22 2119  GLUCAP 301* 228* 224* 271* 344*   Lipid Profile: Recent Labs    11/26/22 0619  CHOL 102  HDL 36*  LDLCALC 42  TRIG 956  CHOLHDL 2.8   Thyroid Function Tests: No results for input(s): "TSH", "T4TOTAL", "FREET4", "T3FREE", "THYROIDAB" in the last 72 hours. Anemia Panel: Recent Labs    11/25/22 2322 11/25/22 2328  VITAMINB12  --  267  FOLATE >40.0  --   FERRITIN 33  --   TIBC 414  --   IRON 60  --    Sepsis Labs: No results for input(s): "PROCALCITON", "LATICACIDVEN" in the last 168 hours.  No results found for this or any previous visit (from the past 240 hour(s)).       Radiology Studies: ECHOCARDIOGRAM COMPLETE  Result Date: 11/26/2022    ECHOCARDIOGRAM REPORT   Patient Name:   Catherine Moon Date of Exam: 11/26/2022 Medical Rec #:  [id number]               Height:       66.0 in Accession #:    [id number]              Weight:       239.9 lb Date of Birth:   07/20/1960               BSA:          2.160 m Patient Age:    62 years                BP:           128/55 mmHg Patient Gender: F                       HR:           85 bpm. Exam Location:  Inpatient Procedure: 2D Echo, Color Doppler and Cardiac Doppler Indications:  NSTEMI  History:        Patient has prior history of Echocardiogram examinations, most                 recent 01/06/2021. CHF, Acute MI; Risk Factors:Hypertension,                 Dyslipidemia and Diabetes.  Sonographer:    Milbert Coulter Referring Phys: [id number] JONATHAN SEGARS IMPRESSIONS  1. Left ventricular ejection fraction, by estimation, is 55 to 60%. The left ventricle has normal function. The left ventricle has no regional wall motion abnormalities. There is moderate asymmetric left ventricular hypertrophy of the basal-septal segment. Left ventricular diastolic parameters were normal.  2. Right ventricular systolic function is normal. The right ventricular size is normal.  3. The mitral valve is normal in structure. Trivial mitral valve regurgitation. No evidence of mitral stenosis. Moderate to severe mitral annular calcification.  4. Functionally bicuspid aortic valve with calcification/partial fusion of LCC and NCC. The aortic valve is abnormal. There is moderate calcification of the aortic valve. There is moderate thickening of the aortic valve. Aortic valve regurgitation is trivial. Aortic valve sclerosis/calcification is present, without any evidence of aortic stenosis. Comparison(s): No significant change from prior study. Conclusion(s)/Recommendation(s): Otherwise normal echocardiogram, with minor abnormalities described in the report. FINDINGS  Left Ventricle: Left ventricular ejection fraction, by estimation, is 55 to 60%. The left ventricle has normal function. The left ventricle has no regional wall motion abnormalities. The left ventricular internal cavity size was normal in size. There is  moderate asymmetric left ventricular  hypertrophy of the basal-septal segment. Left ventricular diastolic parameters were normal. Right Ventricle: The right ventricular size is normal. No increase in right ventricular wall thickness. Right ventricular systolic function is normal. Left Atrium: Left atrial size was normal in size. Right Atrium: Right atrial size was normal in size. Pericardium: There is no evidence of pericardial effusion. Mitral Valve: The mitral valve is normal in structure. Moderate to severe mitral annular calcification. Trivial mitral valve regurgitation. No evidence of mitral valve stenosis. Tricuspid Valve: The tricuspid valve is normal in structure. Tricuspid valve regurgitation is trivial. No evidence of tricuspid stenosis. Aortic Valve: Functionally bicuspid aortic valve with calcification/partial fusion of LCC and NCC. The aortic valve is abnormal. There is moderate calcification of the aortic valve. There is moderate thickening of the aortic valve. Aortic valve regurgitation is trivial. Aortic valve sclerosis/calcification is present, without any evidence of aortic stenosis. Aortic valve mean gradient measures 8.0 mmHg. Aortic valve peak gradient measures 14.0 mmHg. Aortic valve area, by VTI measures 2.15 cm. Pulmonic Valve: The pulmonic valve was grossly normal. Pulmonic valve regurgitation is not visualized. No evidence of pulmonic stenosis. Aorta: The aortic root, ascending aorta and aortic arch are all structurally normal, with no evidence of dilitation or obstruction. Venous: The inferior vena cava was not well visualized. IAS/Shunts: The atrial septum is grossly normal.  LEFT VENTRICLE PLAX 2D LVIDd:         4.20 cm   Diastology LVIDs:         2.90 cm   LV e' medial:    8.05 cm/s LV PW:         1.30 cm   LV E/e' medial:  14.0 LV IVS:        1.40 cm   LV e' lateral:   8.16 cm/s LVOT diam:     2.00 cm   LV E/e' lateral: 13.8 LV SV:  76 LV SV Index:   35 LVOT Area:     3.14 cm  RIGHT VENTRICLE RV Basal diam:  3.10  cm RV Mid diam:    3.00 cm RV S prime:     12.60 cm/s TAPSE (M-mode): 2.0 cm LEFT ATRIUM             Index        RIGHT ATRIUM           Index LA diam:        4.40 cm 2.04 cm/m   RA Area:     14.80 cm LA Vol (A2C):   41.3 ml 19.12 ml/m  RA Volume:   39.00 ml  18.05 ml/m LA Vol (A4C):   37.2 ml 17.22 ml/m LA Biplane Vol: 39.6 ml 18.33 ml/m  AORTIC VALVE AV Area (Vmax):    2.02 cm AV Area (Vmean):   2.02 cm AV Area (VTI):     2.15 cm AV Vmax:           187.00 cm/s AV Vmean:          128.000 cm/s AV VTI:            0.355 m AV Peak Grad:      14.0 mmHg AV Mean Grad:      8.0 mmHg LVOT Vmax:         120.00 cm/s LVOT Vmean:        82.300 cm/s LVOT VTI:          0.243 m LVOT/AV VTI ratio: 0.68  AORTA Ao Root diam: 2.90 cm Ao Asc diam:  3.00 cm MITRAL VALVE MV Area (PHT): 3.74 cm     SHUNTS MV Decel Time: 203 msec     Systemic VTI:  0.24 m MV E velocity: 113.00 cm/s  Systemic Diam: 2.00 cm MV A velocity: 111.00 cm/s MV E/A ratio:  1.02 Jodelle Red Moon Electronically signed by Jodelle Red Moon Signature Date/Time: 11/26/2022/6:15:34 PM    Final    CARDIAC CATHETERIZATION  Result Date: 11/26/2022   Prox RCA lesion is 60% stenosed.   Prox RCA to Mid RCA lesion is 90% stenosed.   Prox Cx to Mid Cx lesion is 30% stenosed.   Prox LAD to Mid LAD lesion is 30% stenosed.   Mid LAD lesion is 20% stenosed.   Ost LM lesion is 40% stenosed.   Mid LM to Dist LM lesion is 70% stenosed.   LPAV lesion is 60% stenosed.   LV end diastolic pressure is severely elevated.   The left ventricular ejection fraction is 50-55% by visual estimate. 1.  Left dominant coronary arteries with significant eccentric distal left main stenosis.  The RCA is also very diseased but nondominant. 2.  Low normal LV systolic function with an EF of 50 to 55%.  Severely elevated left ventricular end-diastolic pressure at 33 mmHg. Recommendations: Recommend evaluation for CABG given distal left main involvement and diabetic status. LVEDP  severely elevated but the patient received aggressive precath hydration.         Scheduled Meds:  aspirin  81 mg Oral Daily   atorvastatin  80 mg Oral Daily   cyanocobalamin  1,000 mcg Intramuscular Daily   ezetimibe  10 mg Oral Daily   ferrous sulfate  325 mg Oral Q breakfast   heparin injection (subcutaneous)  5,000 Units Subcutaneous Q8H   lisinopril  20 mg Oral Daily   And   hydrochlorothiazide  12.5 mg Oral Daily  insulin aspart  0-20 Units Subcutaneous TID WC   insulin aspart  0-5 Units Subcutaneous QHS   insulin aspart  6 Units Subcutaneous TID WC   insulin detemir  50 Units Subcutaneous BID   loratadine  10 mg Oral Daily   metoprolol tartrate  25 mg Oral BID   sodium chloride flush  3 mL Intravenous Q12H   sodium chloride flush  3 mL Intravenous Q12H   Continuous Infusions:     LOS: 3 days   Time spent:  Azucena Fallen, DO [facility]  If 7PM-7AM, please contact night-coverage [email or web address]  11/28/2022, 7:26 AM

## 2022-11-29 ENCOUNTER — Encounter (HOSPITAL_COMMUNITY): Payer: Self-pay | Admitting: Cardiovascular Disease

## 2022-11-29 DIAGNOSIS — I200 Unstable angina: Secondary | ICD-10-CM | POA: Diagnosis not present

## 2022-11-29 LAB — PREPARE RBC (CROSSMATCH)

## 2022-11-29 LAB — BASIC METABOLIC PANEL
Anion gap: 7 (ref 5–15)
BUN: 14 mg/dL (ref 8–23)
CO2: 23 mmol/L (ref 22–32)
Calcium: 9.7 mg/dL (ref 8.9–10.3)
Chloride: 106 mmol/L (ref 98–111)
Creatinine, Ser: 0.8 mg/dL (ref 0.44–1.00)
GFR, Estimated: >60 mL/min (ref >60)
Glucose, Bld: 168 mg/dL — ABNORMAL HIGH (ref 70–99)
Potassium: 4 mmol/L (ref 3.5–5.1)
Sodium: 136 mmol/L (ref 135–145)

## 2022-11-29 LAB — CBC
HCT: 31.9 % — ABNORMAL LOW (ref 36.0–46.0)
Hemoglobin: 10.3 g/dL — ABNORMAL LOW (ref 12.0–15.0)
MCH: 31.9 pg (ref 26.0–34.0)
MCHC: 32.3 g/dL (ref 30.0–36.0)
MCV: 98.8 fL (ref 80.0–100.0)
Platelets: 490 10*3/uL — ABNORMAL HIGH (ref 150–400)
RBC: 3.23 MIL/uL — ABNORMAL LOW (ref 3.87–5.11)
RDW: 17.3 % — ABNORMAL HIGH (ref 11.5–15.5)
WBC: 12 10*3/uL — ABNORMAL HIGH (ref 4.0–10.5)
nRBC: 0 % (ref 0.0–0.2)

## 2022-11-29 LAB — GLUCOSE, CAPILLARY
Glucose-Capillary: 176 mg/dL — ABNORMAL HIGH (ref 70–99)
Glucose-Capillary: 197 mg/dL — ABNORMAL HIGH (ref 70–99)
Glucose-Capillary: 249 mg/dL — ABNORMAL HIGH (ref 70–99)
Glucose-Capillary: 237 mg/dL — ABNORMAL HIGH (ref 70–99)

## 2022-11-29 LAB — SARS CORONAVIRUS 2 BY RT PCR: SARS Coronavirus 2 by RT PCR: NEGATIVE

## 2022-11-29 LAB — APTT: aPTT: 26 s (ref 24–36)

## 2022-11-29 MED ORDER — DEXMEDETOMIDINE HCL IN NACL 400 MCG/100ML IV SOLN
0.1000 ug/kg/h | INTRAVENOUS | Status: AC
  Administered 2022-11-30: .5 ug/kg/h via INTRAVENOUS
  Filled 2022-11-29: qty 100

## 2022-11-29 MED ORDER — NOREPINEPHRINE 4 MG/250ML-% IV SOLN
0.0000 ug/min | INTRAVENOUS | Status: DC
  Filled 2022-11-29: qty 250

## 2022-11-29 MED ORDER — CHLORHEXIDINE GLUCONATE CLOTH 2 % EX PADS
6.0000 | MEDICATED_PAD | Freq: Once | CUTANEOUS | Status: AC
  Administered 2022-11-30: 6 via TOPICAL

## 2022-11-29 MED ORDER — TRANEXAMIC ACID (OHS) PUMP PRIME SOLUTION
2.0000 mg/kg | INTRAVENOUS | Status: DC
  Filled 2022-11-29: qty 2.1

## 2022-11-29 MED ORDER — CHLORHEXIDINE GLUCONATE CLOTH 2 % EX PADS
6.0000 | MEDICATED_PAD | Freq: Once | CUTANEOUS | Status: AC
  Administered 2022-11-29: 6 via TOPICAL

## 2022-11-29 MED ORDER — MILRINONE LACTATE IN DEXTROSE 20-5 MG/100ML-% IV SOLN
0.3000 ug/kg/min | INTRAVENOUS | Status: DC
  Filled 2022-11-29: qty 100

## 2022-11-29 MED ORDER — BISACODYL 5 MG PO TBEC: 5.0000 mg | DELAYED_RELEASE_TABLET | Freq: Once | ORAL | Status: DC

## 2022-11-29 MED ORDER — CHLORHEXIDINE GLUCONATE 0.12 % MT SOLN
15.0000 mL | Freq: Once | OROMUCOSAL | Status: AC
Start: 2022-11-30 — End: 2022-11-30
  Administered 2022-11-30: 15 mL via OROMUCOSAL
  Filled 2022-11-29: qty 15

## 2022-11-29 MED ORDER — TEMAZEPAM 15 MG PO CAPS
15.0000 mg | ORAL_CAPSULE | Freq: Once | ORAL | Status: AC | PRN
  Administered 2022-11-30: 15 mg via ORAL
  Filled 2022-11-29: qty 1

## 2022-11-29 MED ORDER — VANCOMYCIN HCL 1500 MG/300ML IV SOLN
1500.0000 mg | INTRAVENOUS | Status: AC
  Administered 2022-11-30: 1500 mg via INTRAVENOUS
  Filled 2022-11-29: qty 300

## 2022-11-29 MED ORDER — METOPROLOL TARTRATE 12.5 MG HALF TABLET
12.5000 mg | ORAL_TABLET | Freq: Once | ORAL | Status: AC
  Administered 2022-11-30: 12.5 mg via ORAL
  Filled 2022-11-29: qty 1

## 2022-11-29 MED ORDER — EPINEPHRINE HCL 5 MG/250ML IV SOLN IN NS
0.0000 ug/min | INTRAVENOUS | Status: DC
  Filled 2022-11-29: qty 250

## 2022-11-29 MED ORDER — MANNITOL 20 % IV SOLN
INTRAVENOUS | Status: DC
  Filled 2022-11-29: qty 13

## 2022-11-29 MED ORDER — PHENYLEPHRINE HCL-NACL 20-0.9 MG/250ML-% IV SOLN
30.0000 ug/min | INTRAVENOUS | Status: AC
  Administered 2022-11-30: 25 ug/min via INTRAVENOUS
  Filled 2022-11-29: qty 250

## 2022-11-29 MED ORDER — PLASMA-LYTE A IV SOLN
INTRAVENOUS | Status: DC
  Filled 2022-11-29: qty 2.5

## 2022-11-29 MED ORDER — CEFAZOLIN SODIUM-DEXTROSE 2-4 GM/100ML-% IV SOLN
2.0000 g | INTRAVENOUS | Status: DC
  Filled 2022-11-29: qty 100

## 2022-11-29 MED ORDER — POTASSIUM CHLORIDE 2 MEQ/ML IV SOLN
80.0000 meq | INTRAVENOUS | Status: DC
  Filled 2022-11-29: qty 40

## 2022-11-29 MED ORDER — INSULIN REGULAR(HUMAN) IN NACL 100-0.9 UT/100ML-% IV SOLN
INTRAVENOUS | Status: AC
  Administered 2022-11-30: 11 [IU]/h via INTRAVENOUS
  Filled 2022-11-29: qty 100

## 2022-11-29 MED ORDER — NITROGLYCERIN IN D5W 200-5 MCG/ML-% IV SOLN
2.0000 ug/min | INTRAVENOUS | Status: DC
  Filled 2022-11-29: qty 250

## 2022-11-29 MED ORDER — CEFAZOLIN SODIUM-DEXTROSE 2-4 GM/100ML-% IV SOLN
2.0000 g | INTRAVENOUS | Status: AC
  Administered 2022-11-30 (×2): 2 g via INTRAVENOUS
  Filled 2022-11-29: qty 100

## 2022-11-29 MED ORDER — HEPARIN 30,000 UNITS/1000 ML (OHS) CELLSAVER SOLUTION
Status: DC
  Filled 2022-11-29: qty 1000

## 2022-11-29 MED ORDER — TRANEXAMIC ACID 1000 MG/10ML IV SOLN
1.5000 mg/kg/h | INTRAVENOUS | Status: AC
  Administered 2022-11-30: 1.5 mg/kg/h via INTRAVENOUS
  Filled 2022-11-29: qty 25

## 2022-11-29 MED ORDER — TRANEXAMIC ACID (OHS) BOLUS VIA INFUSION
15.0000 mg/kg | INTRAVENOUS | Status: AC
  Administered 2022-11-30: 1578 mg via INTRAVENOUS
  Filled 2022-11-29: qty 1578

## 2022-11-29 NOTE — Progress Notes (Signed)
Per Dr. Cliffton Asters, ABG not needed.

## 2022-11-29 NOTE — Progress Notes (Addendum)
Rounding Note    Patient Name: Catherine Moon Date of Encounter: 11/29/2022  [facility] Cardiologist: None   Subjective   No acute overnight events. No recurrent chest pain. She did have some very minimal jaw pain when walking in the halls yesterday. No shortness of breath. Plan is for CABG tomorrow.  Inpatient Medications    Scheduled Meds:  aspirin  81 mg Oral Daily   atorvastatin  80 mg Oral Daily   cyanocobalamin  1,000 mcg Intramuscular Daily   ezetimibe  10 mg Oral Daily   ferrous sulfate  325 mg Oral Q breakfast   heparin injection (subcutaneous)  5,000 Units Subcutaneous Q8H   hydrochlorothiazide  12.5 mg Oral Daily   insulin aspart  0-20 Units Subcutaneous TID WC   insulin aspart  0-5 Units Subcutaneous QHS   insulin aspart  20 Units Subcutaneous TID WC   insulin detemir  50 Units Subcutaneous BID   loratadine  10 mg Oral Daily   metoprolol tartrate  25 mg Oral BID   sodium chloride flush  3 mL Intravenous Q12H   sodium chloride flush  3 mL Intravenous Q12H   Continuous Infusions:  PRN Meds: acetaminophen, nitroGLYCERIN, ondansetron (ZOFRAN) IV, sodium chloride flush   Vital Signs    Vitals:   11/28/22 0850 11/28/22 2130 11/29/22 0020 11/29/22 0434  BP: 128/61 (!) 118/47 (!) 123/57 (!) 140/58  Pulse:  83 74 77  Resp: 18 19 18 16   Temp: 98.5 F (36.9 C) 98.6 F (37 C) 98.6 F (37 C) 98.6 F (37 C)  TempSrc: Oral Oral Oral Oral  SpO2:    96%  Weight:    105.2 kg  Height:        Intake/Output Summary (Last 24 hours) at 11/29/2022 0845 Last data filed at 11/28/2022 2226 Gross per 24 hour  Intake 243 ml  Output --  Net 243 ml      11/29/2022    4:34 AM 11/28/2022    6:12 AM 11/27/2022    5:16 AM  Last 3 Weights  Weight (lbs) 231 lb 14.4 oz 231 lb 8 oz 234 lb 6.4 oz  Weight (kg) 105.189 kg 105.008 kg 106.323 kg      Telemetry    Normal sinus rhythm with rates in the 70s to 80s. - Personally Reviewed  ECG    No  new ECG tracing today. - Personally Reviewed  Physical Exam   GEN: Obese Caucasian female resting comfortably in no acute distress.   Neck: No JVD. Very soft carotid bruits noted bilaterally. Cardiac: RRR. II//VI systolic murmur. Respiratory: Clear to auscultation bilaterally. No wheezes, rhonchi, or rales. MS: Trace lower extremity edema bilaterally. Neuro:  No focal deficits. Psych: Normal affect. Responds appropriately.   Labs    High Sensitivity Troponin:   Recent Labs  Lab 11/25/22 1533 11/25/22 1730 11/25/22 2322 11/26/22 0619  TROPONINIHS 138* 140* 279* 233*     Chemistry Recent Labs  Lab 11/25/22 1533 11/26/22 0619 11/29/22 0446  NA 135 135 136  K 3.9 3.9 4.0  CL 103 103 106  CO2 23 22 23   GLUCOSE 316* 281* 168*  BUN 10 9 14   CREATININE 0.88 0.67 0.80  CALCIUM 9.5 9.1 9.7  MG  --  1.9  --   GFRNONAA >60 >60 >60  ANIONGAP 9 10 7     Lipids  Recent Labs  Lab 11/26/22 0619  CHOL 102  TRIG 122  HDL 36*  LDLCALC 42  CHOLHDL  2.8    Hematology Recent Labs  Lab 11/25/22 1533 11/26/22 0619 11/29/22 0446  WBC 14.3* 12.4* 12.0*  RBC 2.47* 2.71* 3.23*  HGB 7.9* 8.5* 10.3*  HCT 25.2* 26.9* 31.9*  MCV 102.0* 99.3 98.8  MCH 32.0 31.4 31.9  MCHC 31.3 31.6 32.3  RDW 18.0* 18.2* 17.3*  PLT 439* 417* 490*   Thyroid No results for input(s): "TSH", "FREET4" in the last 168 hours.  BNPNo results for input(s): "BNP", "PROBNP" in the last 168 hours.  DDimer No results for input(s): "DDIMER" in the last 168 hours.   Radiology    VAS US DOPPLER PRE CABG  Result Date: 11/28/2022 PREOPERATIVE VASCULAR EVALUATION Patient Name:  Catherine Moon  Date of Exam:   11/28/2022 Medical Rec #: [id number]                Accession #:    [id number] Date of Birth: May 03, 1960                Patient Gender: F Patient Age:   12 years Exam Location:  [facility] Procedure:      VAS US DOPPLER PRE CABG Referring Phys: HARRELL LIGHTFOOT  --------------------------------------------------------------------------------  Indications:      Pre-CABG. Patient endorses left lower extremity claudication. Risk Factors:     Hypertension, hyperlipidemia, Diabetes, past history of                   smoking, coronary artery disease, PAD. Comparison Study: No prior study on file Performing Technologist: Sherren Kerns RVS  Examination Guidelines: A complete evaluation includes B-mode imaging, spectral Doppler, color Doppler, and power Doppler as needed of all accessible portions of each vessel. Bilateral testing is considered an integral part of a complete examination. Limited examinations for reoccurring indications may be performed as noted.  Right Carotid Findings: +----------+--------+--------+--------+--------+------------------+           PSV cm/sEDV cm/sStenosisDescribeComments           +----------+--------+--------+--------+--------+------------------+ CCA Prox  90      10                      intimal thickening +----------+--------+--------+--------+--------+------------------+ CCA Distal106     22                      intimal thickening +----------+--------+--------+--------+--------+------------------+ ICA Prox  140     41      40-59%  calcific                   +----------+--------+--------+--------+--------+------------------+ ICA Mid   243     49                                         +----------+--------+--------+--------+--------+------------------+ ICA Distal102     20                                         +----------+--------+--------+--------+--------+------------------+ ECA       131     4               calcific                   +----------+--------+--------+--------+--------+------------------+ +----------+--------+-------+---------+------------+           PSV  cm/sEDV cmsDescribe Arm Pressure +----------+--------+-------+---------+------------+ Subclavian291             Turbulent             +----------+--------+-------+---------+------------+ +---------+--------+---+--------+--+ VertebralPSV cm/s116EDV cm/s38 +---------+--------+---+--------+--+ Left Carotid Findings: +----------+--------+--------+--------+--------+------------------+           PSV cm/sEDV cm/sStenosisDescribeComments           +----------+--------+--------+--------+--------+------------------+ CCA Prox  93      16                      intimal thickening +----------+--------+--------+--------+--------+------------------+ CCA Distal112     25                      intimal thickening +----------+--------+--------+--------+--------+------------------+ ICA Prox  248     66      60-79%  calcificShadowing          +----------+--------+--------+--------+--------+------------------+ ICA Mid   105     25                                         +----------+--------+--------+--------+--------+------------------+ ICA Distal106     33                                         +----------+--------+--------+--------+--------+------------------+ ECA       239     1               calcific                   +----------+--------+--------+--------+--------+------------------+ +----------+--------+--------+--------+------------+ SubclavianPSV cm/sEDV cm/sDescribeArm Pressure +----------+--------+--------+--------+------------+           176                                  +----------+--------+--------+--------+------------+  ABI Findings: +------------------+-----+---------+ Rt Pressure (mmHg)IndexWaveform  +------------------+-----+---------+ 131                    triphasic +------------------+-----+---------+ 127               0.97 biphasic  +------------------+-----+---------+ 128               0.98 biphasic  +------------------+-----+---------+ 77                0.59 Abnormal  +------------------+-----+---------+  +------------------+-----+---------+ Lt Pressure (mmHg)IndexWaveform  +------------------+-----+---------+ 128                    triphasic +------------------+-----+---------+ 93                0.71 biphasic  +------------------+-----+---------+ 120               0.92 biphasic  +------------------+-----+---------+ 49                0.37 Abnormal  +------------------+-----+---------+ +-------+---------------+ ABI/TBIToday's ABI/TBI +-------+---------------+ Right  0.98/0.59       +-------+---------------+ Left   0.92/0.37       +-------+---------------+  Right Doppler Findings: +--------+--------+---------+ Site    PressureDoppler   +--------+--------+---------+ [id number]     triphasic +--------+--------+---------+ Radial          triphasic +--------+--------+---------+ Ulnar           triphasic +--------+--------+---------+  Left Doppler Findings: +--------+--------+---------+ Site    PressureDoppler   +--------+--------+---------+ [id number]     triphasic +--------+--------+---------+ Radial          triphasic +--------+--------+---------+ Ulnar           triphasic +--------+--------+---------+   Summary: Right Carotid: Velocities in the right ICA are consistent with a 40-59%                stenosis. Left Carotid: Velocities in the left ICA are consistent with a 60-79% stenosis. Vertebrals:  Right vertebral artery demonstrates antegrade flow. Subclavians: Normal flow hemodynamics were seen in bilateral subclavian              arteries. Right ABI: Resting right ankle-brachial index is within normal range. The right toe-brachial index is abnormal. Left ABI: Resting left ankle-brachial index is within normal range. The left toe-brachial index is abnormal. Right Upper Extremity: Doppler waveforms remain within normal limits with right radial compression. Doppler waveforms decrease 50% with right ulnar compression. Left Upper Extremity: Doppler waveforms  remain within normal limits with left radial compression. Doppler waveform obliterate with left ulnar compression.  Electronically signed by Gerarda Fraction on 11/28/2022 at 3:45:36 PM.    Final     Cardiac Studies   Echocardiogram 11/26/2022: Impressions:  1. Left ventricular ejection fraction, by estimation, is 55 to 60%. The  left ventricle has normal function. The left ventricle has no regional  wall motion abnormalities. There is moderate asymmetric left ventricular  hypertrophy of the basal-septal  segment. Left ventricular diastolic parameters were normal.   2. Right ventricular systolic function is normal. The right ventricular  size is normal.   3. The mitral valve is normal in structure. Trivial mitral valve  regurgitation. No evidence of mitral stenosis. Moderate to severe mitral  annular calcification.   4. Functionally bicuspid aortic valve with calcification/partial fusion  of LCC and NCC. The aortic valve is abnormal. There is moderate  calcification of the aortic valve. There is moderate thickening of the  aortic valve. Aortic valve regurgitation is  trivial. Aortic valve sclerosis/calcification is present, without any  evidence of aortic stenosis.  _______________  Left Cardiac Catheterization 11/26/2022:   Prox RCA lesion is 60% stenosed.   Prox RCA to Mid RCA lesion is 90% stenosed.   Prox Cx to Mid Cx lesion is 30% stenosed.   Prox LAD to Mid LAD lesion is 30% stenosed.   Mid LAD lesion is 20% stenosed.   Ost LM lesion is 40% stenosed.   Mid LM to Dist LM lesion is 70% stenosed.   LPAV lesion is 60% stenosed.   LV end diastolic pressure is severely elevated.   The left ventricular ejection fraction is 50-55% by visual estimate.   1.  Left dominant coronary arteries with significant eccentric distal left main stenosis.  The RCA is also very diseased but nondominant. 2.  Low normal LV systolic function with an EF of 50 to 55%.  Severely elevated left ventricular  end-diastolic pressure at 33 mmHg.   Recommendations: Recommend evaluation for CABG given distal left main involvement and diabetic status. LVEDP severely elevated but the patient received aggressive precath hydration.    Diagnostic Dominance: Left  _______________  Pre-CABG Dopplers 11/28/2022: Summary:  - Right Carotid: Velocities in the right ICA are consistent with a 40-59% stenosis.  - Left Carotid: Velocities in the left ICA are consistent with a 60-79% stenosis.  - Vertebrals: Right vertebral artery demonstrates antegrade flow.  - Subclavians: Normal flow  hemodynamics were seen in bilateral subclavian arteries.   - Right ABI: Resting right ankle-brachial index is within normal range. The right toe-brachial index is abnormal.  - Left ABI: Resting left ankle-brachial index is within normal range. The left toe-brachial index is abnormal.  - Right Upper Extremity: Doppler waveforms remain within normal limits with right radial compression. Doppler waveforms decrease 50% with right ulnar  compression.  - Left Upper Extremity: Doppler waveforms remain within normal limits with left radial compression. Doppler waveform obliterate with left ulnar  compression.     Patient Profile     62 y.o. female with a history of hypertension, hyperlipidemia, type 2 diabetes mellitus, and tobacco abuse who presented on 11/26/2022 from PCP's office for chest pain and ruled in for NSTEMI. 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 CT surgery was consulted and plan is for CABG.  Assessment & Plan    NSTEMI Patient presented with chest pain that radiated to her jaw and back. EKG showed mild ST depression in lead I and T wave changes in aVL. High-sensitivity troponin 138 >> 140 >> 279 >> 233. Echo showed LVEF of 55-60% with normal wall motion and asymmetric LVH of the basal-septal segment. LHC showed multivessel CAD including 70% stenosis of distal left main. CT surgery was  consulted who recommended CABG. - No recurrent chest pain. She did reports some very minimal jaw pain while ambulating in the halls yesterday. - Continue Aspirin 81mg  daily, Lopressor 25mg  twice daily, and Lipitor 80mg  daily.  - Will hold Lisinopril in preparation for CABG. - Plan is for CABG tomorrow.  Bicuspid Aortic Valve  Echo showed functionally bicuspid aortic valve with calcification/ partial fusion of LCC and NCC and moderate calcification but no aortic stenosis. 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 - Continue to monitor as an outpatient.   Carotid Stenosis Pre-CABG dopplers showed 40-59% stenosis of right ICA and 60-79% stenosis of left ICA.  - Continue aspirin and statin. - Continue to monitor as an outpatient.   Hypertension BP was markedly elevated at PCP office prior to admission but mostly well controlled now. - Continue Lopressor 25mg  twice daily. - Continue HCTZ 12.5mg  daily. - Will hold Lisinopril in preparation for CABG tomorrow.  Hyperlipidemia Lipid panel this admission: Total Cholesterol 102, Triglycerides 122, HDL 36, LDL 42. Lipoprotein (a) pending. - Continue Lipitor 80mg  daily.  Otherwise, per primary team: - Leukocytosis - Chronic anemia - Type 2 diabetes mellitus: Hgb A1c 7.5%    For questions or updates, please contact [facility] Please consult [email or web address] for contact info under        Signed, Corrin Parker, PA-C  11/29/2022, 8:45 AM     Patient seen, examined. Available data reviewed. Agree with findings, assessment, and plan as outlined by Marjie Skiff, PA-C.  The patient is independently interviewed and examined.  She is alert, oriented, no distress.  HEENT is normal, JVP is normal, carotid upstrokes are normal, heart is regular rate and rhythm no murmur gallop, lungs are clear bilaterally, extremities have trace pretibial edema bilaterally.  She has had no chest  discomfort overnight and states she is feeling a little bit better in the last 24 hours.  All of her questions were answered regarding tomorrow's surgery.  I reviewed her coronary anatomy with her and explained the rationale for CABG with severe left main disease.  She has noted to have a bicuspid aortic valve with no  significant aortic stenosis.  High-sensitivity troponins are mildly elevated.  LVEF is normal with no regional wall motion abnormalities.  Tonny Bollman, M.D. 11/29/2022 10:13 AM

## 2022-11-29 NOTE — Progress Notes (Signed)
PROGRESS NOTE    Catherine Moon  MVH:[id number] DOB: [birth year] DOA: 11/25/2022 PCP: [place], Betty G, MD   Brief Narrative:  Catherine Moon is an 62 y.o. female past medical history of essential hypertension, hyperlipidemia, diabetes former smoker comes in with chest pain and with severe changes in her blood pressure with a systolic greater than 200 and tachycardic in the 130s, she relates that her symptoms started about 4 days prior to admission with new exertional chest pain, resolved with rest episodes lasting about 15 minutes.  Assessment & Plan:   Principal Problem:   Unstable angina (HCC) Active Problems:   Hyperlipidemia associated with type 2 diabetes mellitus (HCC)   Essential hypertension   (HFpEF) heart failure with preserved ejection fraction (HCC)   NSTEMI (non-ST elevated myocardial infarction) (HCC)   Unstable angina/NSTEMI (non-ST elevated myocardial infarction) (HCC) CAD Functional bicuspid aortic valve Multiple risk factor with a classical history of exertional chest pain better with rest. Troponins minimally elevated EKG is unremarkable. Cardiology was consulted recommended against heparin. Started on aspirin, continue nitro as needed, metoprolol Per cardiac catheterization given 70% stenosis of the left main patient has been tentatively scheduled for CABG Tuesday, 11/30/2022   Symptomatic macrocytic anemia: In the setting of heavy post-menopausal bleeding - followed by ObGyn. Continue B12 replacement Goal hemoglobin greater than 8   Hypertensive urgency: Pressure at the PCP was greater than 200. Was recently started on Megace which can cause increase in blood pressure. She was started on her home regimen her blood pressure this morning is significantly improved 128/55 Question if her chest pain was likely due to elevated blood pressure, would likely need ischemic workup.   Hyperlipidemia: Continue statins.   Diabetes mellitus type 2  with hyperglycemia: At home she is on U-500 as well as Mounjaro.  DVT prophylaxis: heparin injection 5,000 Units Start: 11/26/22 0600 SCDs Start: 11/25/22 2307   Code Status:   Code Status: Full Code  Family Communication: At bedside  Status is: Inpatient  Dispo: The patient is from: Home              Anticipated d/c is to: Home              Anticipated d/c date is: 72+ hours              Patient currently not medically stable for discharge  Consultants:  Cardiology, CT surgery  Procedures:  Left heart cath 11/22 CABG planned 11/26  Antimicrobials:  None  Subjective: No acute issues or events overnight  Objective: Vitals:   11/28/22 0850 11/28/22 2130 11/29/22 0020 11/29/22 0434  BP: 128/61 (!) 118/47 (!) 123/57 (!) 140/58  Pulse:  83 74 77  Resp: 18 19 18 16   Temp: 98.5 F (36.9 C) 98.6 F (37 C) 98.6 F (37 C) 98.6 F (37 C)  TempSrc: Oral Oral Oral Oral  SpO2:    96%  Weight:    105.2 kg  Height:        Intake/Output Summary (Last 24 hours) at 11/29/2022 0828 Last data filed at 11/28/2022 2226 Gross per 24 hour  Intake 243 ml  Output --  Net 243 ml   Filed Weights   11/27/22 0516 11/28/22 0612 11/29/22 0434  Weight: 106.3 kg 105 kg 105.2 kg    Examination:  General:  Pleasantly resting in bed, No acute distress. HEENT:  Normocephalic atraumatic.  Sclerae nonicteric, noninjected.  Extraocular movements intact bilaterally. Neck:  Without mass or deformity.  Trachea is  midline. Lungs:  Clear to auscultate bilaterally without rhonchi, wheeze, or rales. Heart:  Regular rate and rhythm.  Without murmurs, rubs, or gallops. Abdomen:  Soft, nontender, nondistended.  Without guarding or rebound. Extremities: Without cyanosis, clubbing, edema, or obvious deformity. Skin:  Warm and dry, no erythema.  Data Reviewed: I have personally reviewed following labs and imaging studies  CBC: Recent Labs  Lab 11/25/22 1533 11/26/22 0619 11/29/22 0446  WBC  14.3* 12.4* 12.0*  HGB 7.9* 8.5* 10.3*  HCT 25.2* 26.9* 31.9*  MCV 102.0* 99.3 98.8  PLT 439* 417* 490*   Basic Metabolic Panel: Recent Labs  Lab 11/25/22 1533 11/26/22 0619 11/29/22 0446  NA 135 135 136  K 3.9 3.9 4.0  CL 103 103 106  CO2 23 22 23   GLUCOSE 316* 281* 168*  BUN 10 9 14   CREATININE 0.88 0.67 0.80  CALCIUM 9.5 9.1 9.7  MG  --  1.9  --   PHOS  --  4.5  --    GFR: Estimated Creatinine Clearance: 90.6 mL/min (by C-G formula based on SCr of 0.8 mg/dL). Liver Function Tests: No results for input(s): "AST", "ALT", "ALKPHOS", "BILITOT", "PROT", "ALBUMIN" in the last 168 hours. No results for input(s): "LIPASE", "AMYLASE" in the last 168 hours. No results for input(s): "AMMONIA" in the last 168 hours. Coagulation Profile: Recent Labs  Lab 11/26/22 0619  INR 1.0   Cardiac Enzymes: No results for input(s): "CKTOTAL", "CKMB", "CKMBINDEX", "TROPONINI" in the last 168 hours. BNP (last 3 results) No results for input(s): "PROBNP" in the last 8760 hours. HbA1C: No results for input(s): "HGBA1C" in the last 72 hours.  CBG: Recent Labs  Lab 11/28/22 0803 11/28/22 1305 11/28/22 1606 11/28/22 2129 11/29/22 0752  GLUCAP 242* 190* 266* 242* 176*   Lipid Profile: No results for input(s): "CHOL", "HDL", "LDLCALC", "TRIG", "CHOLHDL", "LDLDIRECT" in the last 72 hours.  Thyroid Function Tests: No results for input(s): "TSH", "T4TOTAL", "FREET4", "T3FREE", "THYROIDAB" in the last 72 hours. Anemia Panel: No results for input(s): "VITAMINB12", "FOLATE", "FERRITIN", "TIBC", "IRON", "RETICCTPCT" in the last 72 hours.  Sepsis Labs: No results for input(s): "PROCALCITON", "LATICACIDVEN" in the last 168 hours.  Recent Results (from the past 240 hour(s))  Surgical pcr screen     Status: None   Collection Time: 11/28/22  1:33 PM   Specimen: Urine, Clean Catch; Nasal Swab  Result Value Ref Range Status   MRSA, PCR NEGATIVE NEGATIVE Final   Staphylococcus aureus NEGATIVE  NEGATIVE Final    Comment: (NOTE) The Xpert SA Assay (FDA approved for NASAL specimens in patients 63 years of age and older), is one component of a comprehensive surveillance program. It is not intended to diagnose infection nor to guide or monitor treatment. Performed at [facility], [address] [place], [place] [address]          Radiology Studies: VAS US DOPPLER PRE CABG  Result Date: 11/28/2022 PREOPERATIVE VASCULAR EVALUATION Patient Name:  NEILAH THAMMAVONG  Date of Exam:   11/28/2022 Medical Rec #: [id number]                Accession #:    [id number] Date of Birth: 05/14/1960                Patient Gender: F Patient Age:   21 years Exam Location:  [facility] Procedure:      VAS US DOPPLER PRE CABG Referring Phys: HARRELL LIGHTFOOT --------------------------------------------------------------------------------  Indications:  Pre-CABG. Patient endorses left lower extremity claudication. Risk Factors:     Hypertension, hyperlipidemia, Diabetes, past history of                   smoking, coronary artery disease, PAD. Comparison Study: No prior study on file Performing Technologist: Sherren Kerns RVS  Examination Guidelines: A complete evaluation includes B-mode imaging, spectral Doppler, color Doppler, and power Doppler as needed of all accessible portions of each vessel. Bilateral testing is considered an integral part of a complete examination. Limited examinations for reoccurring indications may be performed as noted.  Right Carotid Findings: +----------+--------+--------+--------+--------+------------------+           PSV cm/sEDV cm/sStenosisDescribeComments           +----------+--------+--------+--------+--------+------------------+ CCA Prox  90      10                      intimal thickening +----------+--------+--------+--------+--------+------------------+ CCA Distal106     22                      intimal thickening  +----------+--------+--------+--------+--------+------------------+ ICA Prox  140     41      40-59%  calcific                   +----------+--------+--------+--------+--------+------------------+ ICA Mid   243     49                                         +----------+--------+--------+--------+--------+------------------+ ICA Distal102     20                                         +----------+--------+--------+--------+--------+------------------+ ECA       131     4               calcific                   +----------+--------+--------+--------+--------+------------------+ +----------+--------+-------+---------+------------+           PSV cm/sEDV cmsDescribe Arm Pressure +----------+--------+-------+---------+------------+ Subclavian291            Turbulent             +----------+--------+-------+---------+------------+ +---------+--------+---+--------+--+ VertebralPSV cm/s116EDV cm/s38 +---------+--------+---+--------+--+ Left Carotid Findings: +----------+--------+--------+--------+--------+------------------+           PSV cm/sEDV cm/sStenosisDescribeComments           +----------+--------+--------+--------+--------+------------------+ CCA Prox  93      16                      intimal thickening +----------+--------+--------+--------+--------+------------------+ CCA Distal112     25                      intimal thickening +----------+--------+--------+--------+--------+------------------+ ICA Prox  248     66      60-79%  calcificShadowing          +----------+--------+--------+--------+--------+------------------+ ICA Mid   105     25                                         +----------+--------+--------+--------+--------+------------------+ ICA Distal106  33                                         +----------+--------+--------+--------+--------+------------------+ ECA       239     1               calcific                    +----------+--------+--------+--------+--------+------------------+ +----------+--------+--------+--------+------------+ SubclavianPSV cm/sEDV cm/sDescribeArm Pressure +----------+--------+--------+--------+------------+           176                                  +----------+--------+--------+--------+------------+  ABI Findings: +------------------+-----+---------+ Rt Pressure (mmHg)IndexWaveform  +------------------+-----+---------+ 131                    triphasic +------------------+-----+---------+ 127               0.97 biphasic  +------------------+-----+---------+ 128               0.98 biphasic  +------------------+-----+---------+ 77                0.59 Abnormal  +------------------+-----+---------+ +------------------+-----+---------+ Lt Pressure (mmHg)IndexWaveform  +------------------+-----+---------+ 128                    triphasic +------------------+-----+---------+ 93                0.71 biphasic  +------------------+-----+---------+ 120               0.92 biphasic  +------------------+-----+---------+ 49                0.37 Abnormal  +------------------+-----+---------+ +-------+---------------+ ABI/TBIToday's ABI/TBI +-------+---------------+ Right  0.98/0.59       +-------+---------------+ Left   0.92/0.37       +-------+---------------+  Right Doppler Findings: +--------+--------+---------+ Site    PressureDoppler   +--------+--------+---------+ [id number]     triphasic +--------+--------+---------+ Radial          triphasic +--------+--------+---------+ Ulnar           triphasic +--------+--------+---------+  Left Doppler Findings: +--------+--------+---------+ Site    PressureDoppler   +--------+--------+---------+ [id number]     triphasic +--------+--------+---------+ Radial          triphasic +--------+--------+---------+ Ulnar           triphasic +--------+--------+---------+    Summary: Right Carotid: Velocities in the right ICA are consistent with a 40-59%                stenosis. Left Carotid: Velocities in the left ICA are consistent with a 60-79% stenosis. Vertebrals:  Right vertebral artery demonstrates antegrade flow. Subclavians: Normal flow hemodynamics were seen in bilateral subclavian              arteries. Right ABI: Resting right ankle-brachial index is within normal range. The right toe-brachial index is abnormal. Left ABI: Resting left ankle-brachial index is within normal range. The left toe-brachial index is abnormal. Right Upper Extremity: Doppler waveforms remain within normal limits with right radial compression. Doppler waveforms decrease 50% with right ulnar compression. Left Upper Extremity: Doppler waveforms remain within normal limits with left radial compression. Doppler waveform obliterate with left ulnar compression.  Electronically signed by Gerarda Fraction on 11/28/2022 at 3:45:36 PM.    Final  Scheduled Meds:  aspirin  81 mg Oral Daily   atorvastatin  80 mg Oral Daily   cyanocobalamin  1,000 mcg Intramuscular Daily   ezetimibe  10 mg Oral Daily   ferrous sulfate  325 mg Oral Q breakfast   heparin injection (subcutaneous)  5,000 Units Subcutaneous Q8H   lisinopril  20 mg Oral Daily   And   hydrochlorothiazide  12.5 mg Oral Daily   insulin aspart  0-20 Units Subcutaneous TID WC   insulin aspart  0-5 Units Subcutaneous QHS   insulin aspart  20 Units Subcutaneous TID WC   insulin detemir  50 Units Subcutaneous BID   loratadine  10 mg Oral Daily   metoprolol tartrate  25 mg Oral BID   sodium chloride flush  3 mL Intravenous Q12H   sodium chloride flush  3 mL Intravenous Q12H   Continuous Infusions:     LOS: 4 days   Time spent:  Azucena Fallen, DO [facility]  If 7PM-7AM, please contact night-coverage [email or web address]  11/29/2022, 8:28 AM

## 2022-11-29 NOTE — Inpatient Diabetes Management (Signed)
Inpatient Diabetes Program Recommendations  AACE/ADA: New Consensus Statement on Inpatient Glycemic Control (2015)  Target Ranges:  Prepandial:   less than 140 mg/dL      Peak postprandial:   less than 180 mg/dL (1-2 hours)      Critically ill patients:  140 - 180 mg/dL   Lab Results  Component Value Date   GLUCAP 176 (H) 11/29/2022   HGBA1C 7.5 (H) 11/26/2022    Review of Glycemic Control  Latest Reference Range & Units 11/28/22 08:03 11/28/22 13:05 11/28/22 16:06 11/28/22 21:29 11/29/22 07:52  Glucose-Capillary 70 - 99 mg/dL 119 (H) 147 (H) 829 (H) 242 (H) 176 (H)  (H): Data is abnormally high Diabetes history: Type 2 DM Outpatient Diabetes medications: U-500 100 units TID, Mounjaro 10 mg qwk Current orders for Inpatient glycemic control: Levemir 50 units BID, Novolog 0-20 units TID & HS, Novolog 20 units TID   Inpatient Diabetes Program Recommendations:  Consider increasing meal coverage to Novolog 25 units TID (Assuming patient consuming >50% of meals) Plan for IV insulin for CABG.  Thanks, Lujean Rave, MSN, RNC-OB Diabetes Coordinator [phone] (8a-5p)

## 2022-11-29 NOTE — Progress Notes (Signed)
Mobility Specialist Progress Note:   11/29/22 1045  Mobility  Activity Ambulated independently in hallway  Level of Assistance Modified independent, requires aide device or extra time  Assistive Device None  Distance Ambulated (ft) 300 ft  Activity Response Tolerated well  Mobility Referral Yes  $Mobility charge 1 Mobility  Mobility Specialist Start Time (ACUTE ONLY) 1045  Mobility Specialist Stop Time (ACUTE ONLY) 1056  Mobility Specialist Time Calculation (min) (ACUTE ONLY) 11 min   Pt agreeable to mobility session. Required no physical assistance throughout ambulation. Pt c/o minor SOB throughout. Pt back in bed with all needs met.    Addison Lank Mobility Specialist Please contact via SecureChat or  Rehab office at [phone]

## 2022-11-29 NOTE — Progress Notes (Signed)
CARDIAC REHAB PHASE I   Pt sitting in chair, feeling well today. Pre-op education completed. Plan for surgery 11-26. All questions and concerns addressed. Will continue to follow.   [id number]  Woodroe Chen, RN BSN 11/29/2022 10:13 AM

## 2022-11-29 NOTE — Progress Notes (Signed)
     [address]       C[place] Moon [address]             [phone]       The risks and benefits of CABG were discussed in detail.  The patient is agreeable to proceed.   OR tomorrow  [name]

## 2022-11-30 ENCOUNTER — Inpatient Hospital Stay (HOSPITAL_COMMUNITY): Payer: BC Managed Care – PPO

## 2022-11-30 ENCOUNTER — Other Ambulatory Visit: Payer: Self-pay

## 2022-11-30 ENCOUNTER — Inpatient Hospital Stay (HOSPITAL_COMMUNITY): Payer: BC Managed Care – PPO | Admitting: Anesthesiology

## 2022-11-30 ENCOUNTER — Other Ambulatory Visit (HOSPITAL_COMMUNITY): Payer: BC Managed Care – PPO

## 2022-11-30 ENCOUNTER — Inpatient Hospital Stay (HOSPITAL_COMMUNITY)
Admission: EM | Disposition: A | Discharge status: Home / Self Care | Payer: Self-pay | Source: Ambulatory Visit | Attending: Thoracic Surgery (Cardiothoracic Vascular Surgery)

## 2022-11-30 ENCOUNTER — Inpatient Hospital Stay (HOSPITAL_COMMUNITY): Payer: Self-pay | Admitting: Anesthesiology

## 2022-11-30 ENCOUNTER — Encounter (HOSPITAL_COMMUNITY): Payer: Self-pay | Admitting: Internal Medicine

## 2022-11-30 DIAGNOSIS — I10 Essential (primary) hypertension: Secondary | ICD-10-CM

## 2022-11-30 DIAGNOSIS — I25110 Atherosclerotic heart disease of native coronary artery with unstable angina pectoris: Secondary | ICD-10-CM

## 2022-11-30 DIAGNOSIS — I214 Non-ST elevation (NSTEMI) myocardial infarction: Secondary | ICD-10-CM | POA: Diagnosis not present

## 2022-11-30 DIAGNOSIS — I2510 Atherosclerotic heart disease of native coronary artery without angina pectoris: Secondary | ICD-10-CM | POA: Diagnosis not present

## 2022-11-30 DIAGNOSIS — Z951 Presence of aortocoronary bypass graft: Secondary | ICD-10-CM

## 2022-11-30 DIAGNOSIS — I200 Unstable angina: Secondary | ICD-10-CM | POA: Diagnosis not present

## 2022-11-30 DIAGNOSIS — E119 Type 2 diabetes mellitus without complications: Secondary | ICD-10-CM | POA: Diagnosis not present

## 2022-11-30 HISTORY — PX: TEE WITHOUT CARDIOVERSION: SHX5443

## 2022-11-30 HISTORY — DX: Presence of aortocoronary bypass graft: Z95.1

## 2022-11-30 HISTORY — PX: CORONARY ARTERY BYPASS GRAFT: SHX141

## 2022-11-30 LAB — POCT I-STAT 7, (LYTES, BLD GAS, ICA,H+H)
Acid-base deficit: 4 mmol/L — ABNORMAL HIGH (ref 0.0–2.0)
Acid-base deficit: 3 mmol/L — ABNORMAL HIGH (ref 0.0–2.0)
Acid-base deficit: 3 mmol/L — ABNORMAL HIGH (ref 0.0–2.0)
Acid-base deficit: 4 mmol/L — ABNORMAL HIGH (ref 0.0–2.0)
Acid-base deficit: 6 mmol/L — ABNORMAL HIGH (ref 0.0–2.0)
Acid-base deficit: 5 mmol/L — ABNORMAL HIGH (ref 0.0–2.0)
Acid-base deficit: 5 mmol/L — ABNORMAL HIGH (ref 0.0–2.0)
Bicarbonate: 22.5 mmol/L (ref 20.0–28.0)
Bicarbonate: 22.7 mmol/L (ref 20.0–28.0)
Bicarbonate: 21.7 mmol/L (ref 20.0–28.0)
Bicarbonate: 21.8 mmol/L (ref 20.0–28.0)
Bicarbonate: 20.6 mmol/L (ref 20.0–28.0)
Bicarbonate: 19.9 mmol/L — ABNORMAL LOW (ref 20.0–28.0)
Bicarbonate: 19.7 mmol/L — ABNORMAL LOW (ref 20.0–28.0)
Calcium, Ion: 1.27 mmol/L (ref 1.15–1.40)
Calcium, Ion: 0.94 mmol/L — ABNORMAL LOW (ref 1.15–1.40)
Calcium, Ion: 1.09 mmol/L — ABNORMAL LOW (ref 1.15–1.40)
Calcium, Ion: 1.33 mmol/L (ref 1.15–1.40)
Calcium, Ion: 1.28 mmol/L (ref 1.15–1.40)
Calcium, Ion: 1.18 mmol/L (ref 1.15–1.40)
Calcium, Ion: 1.16 mmol/L (ref 1.15–1.40)
HCT: 27 % — ABNORMAL LOW (ref 36.0–46.0)
HCT: 20 % — ABNORMAL LOW (ref 36.0–46.0)
HCT: 19 % — ABNORMAL LOW (ref 36.0–46.0)
HCT: 25 % — ABNORMAL LOW (ref 36.0–46.0)
HCT: 30 % — ABNORMAL LOW (ref 36.0–46.0)
HCT: 27 % — ABNORMAL LOW (ref 36.0–46.0)
HCT: 29 % — ABNORMAL LOW (ref 36.0–46.0)
Hemoglobin: 9.2 g/dL — ABNORMAL LOW (ref 12.0–15.0)
Hemoglobin: 6.8 g/dL — CL (ref 12.0–15.0)
Hemoglobin: 6.5 g/dL — CL (ref 12.0–15.0)
Hemoglobin: 8.5 g/dL — ABNORMAL LOW (ref 12.0–15.0)
Hemoglobin: 10.2 g/dL — ABNORMAL LOW (ref 12.0–15.0)
Hemoglobin: 9.2 g/dL — ABNORMAL LOW (ref 12.0–15.0)
Hemoglobin: 9.9 g/dL — ABNORMAL LOW (ref 12.0–15.0)
O2 Saturation: 100 %
O2 Saturation: 100 %
O2 Saturation: 100 %
O2 Saturation: 100 %
O2 Saturation: 96 %
O2 Saturation: 98 %
O2 Saturation: 95 %
Patient temperature: 37.2
Patient temperature: 37.2
Patient temperature: 37.3
Potassium: 3.7 mmol/L (ref 3.5–5.1)
Potassium: 4 mmol/L (ref 3.5–5.1)
Potassium: 4.7 mmol/L (ref 3.5–5.1)
Potassium: 5.2 mmol/L — ABNORMAL HIGH (ref 3.5–5.1)
Potassium: 4 mmol/L (ref 3.5–5.1)
Potassium: 4.2 mmol/L (ref 3.5–5.1)
Potassium: 4.1 mmol/L (ref 3.5–5.1)
Sodium: 138 mmol/L (ref 135–145)
Sodium: 139 mmol/L (ref 135–145)
Sodium: 132 mmol/L — ABNORMAL LOW (ref 135–145)
Sodium: 133 mmol/L — ABNORMAL LOW (ref 135–145)
Sodium: 139 mmol/L (ref 135–145)
Sodium: 142 mmol/L (ref 135–145)
Sodium: 142 mmol/L (ref 135–145)
TCO2: 24 mmol/L (ref 22–32)
TCO2: 24 mmol/L (ref 22–32)
TCO2: 23 mmol/L (ref 22–32)
TCO2: 23 mmol/L (ref 22–32)
TCO2: 22 mmol/L (ref 22–32)
TCO2: 21 mmol/L — ABNORMAL LOW (ref 22–32)
TCO2: 21 mmol/L — ABNORMAL LOW (ref 22–32)
pCO2 arterial: 44.6 mm[Hg] (ref 32–48)
pCO2 arterial: 40.9 mm[Hg] (ref 32–48)
pCO2 arterial: 38.4 mm[Hg] (ref 32–48)
pCO2 arterial: 41.3 mm[Hg] (ref 32–48)
pCO2 arterial: 44.3 mm[Hg] (ref 32–48)
pCO2 arterial: 37.4 mm[Hg] (ref 32–48)
pCO2 arterial: 36.7 mm[Hg] (ref 32–48)
pH, Arterial: 7.31 — ABNORMAL LOW (ref 7.35–7.45)
pH, Arterial: 7.352 (ref 7.35–7.45)
pH, Arterial: 7.331 — ABNORMAL LOW (ref 7.35–7.45)
pH, Arterial: 7.36 (ref 7.35–7.45)
pH, Arterial: 7.276 — ABNORMAL LOW (ref 7.35–7.45)
pH, Arterial: 7.336 — ABNORMAL LOW (ref 7.35–7.45)
pH, Arterial: 7.34 — ABNORMAL LOW (ref 7.35–7.45)
pO2, Arterial: 299 mm[Hg] — ABNORMAL HIGH (ref 83–108)
pO2, Arterial: 338 mm[Hg] — ABNORMAL HIGH (ref 83–108)
pO2, Arterial: 193 mm[Hg] — ABNORMAL HIGH (ref 83–108)
pO2, Arterial: 259 mm[Hg] — ABNORMAL HIGH (ref 83–108)
pO2, Arterial: 94 mm[Hg] (ref 83–108)
pO2, Arterial: 115 mm[Hg] — ABNORMAL HIGH (ref 83–108)
pO2, Arterial: 83 mm[Hg] (ref 83–108)

## 2022-11-30 LAB — BASIC METABOLIC PANEL
Anion gap: 8 (ref 5–15)
Anion gap: 7 (ref 5–15)
BUN: 13 mg/dL (ref 8–23)
BUN: 10 mg/dL (ref 8–23)
CO2: 25 mmol/L (ref 22–32)
CO2: 21 mmol/L — ABNORMAL LOW (ref 22–32)
Calcium: 9.5 mg/dL (ref 8.9–10.3)
Calcium: 8.3 mg/dL — ABNORMAL LOW (ref 8.9–10.3)
Chloride: 103 mmol/L (ref 98–111)
Chloride: 111 mmol/L (ref 98–111)
Creatinine, Ser: 0.78 mg/dL (ref 0.44–1.00)
Creatinine, Ser: 0.69 mg/dL (ref 0.44–1.00)
GFR, Estimated: >60 mL/min (ref >60)
GFR, Estimated: >60 mL/min (ref >60)
Glucose, Bld: 242 mg/dL — ABNORMAL HIGH (ref 70–99)
Glucose, Bld: 134 mg/dL — ABNORMAL HIGH (ref 70–99)
Potassium: 3.9 mmol/L (ref 3.5–5.1)
Potassium: 4.1 mmol/L (ref 3.5–5.1)
Sodium: 136 mmol/L (ref 135–145)
Sodium: 139 mmol/L (ref 135–145)

## 2022-11-30 LAB — CBC
HCT: 31.6 % — ABNORMAL LOW (ref 36.0–46.0)
HCT: 28.9 % — ABNORMAL LOW (ref 36.0–46.0)
HCT: 30.6 % — ABNORMAL LOW (ref 36.0–46.0)
Hemoglobin: 10.1 g/dL — ABNORMAL LOW (ref 12.0–15.0)
Hemoglobin: 9.3 g/dL — ABNORMAL LOW (ref 12.0–15.0)
Hemoglobin: 9.8 g/dL — ABNORMAL LOW (ref 12.0–15.0)
MCH: 31.2 pg (ref 26.0–34.0)
MCH: 31.2 pg (ref 26.0–34.0)
MCH: 30.1 pg (ref 26.0–34.0)
MCHC: 32 g/dL (ref 30.0–36.0)
MCHC: 32.2 g/dL (ref 30.0–36.0)
MCHC: 32 g/dL (ref 30.0–36.0)
MCV: 97.5 fL (ref 80.0–100.0)
MCV: 97 fL (ref 80.0–100.0)
MCV: 93.9 fL (ref 80.0–100.0)
Platelets: 427 10*3/uL — ABNORMAL HIGH (ref 150–400)
Platelets: 249 10*3/uL (ref 150–400)
Platelets: 277 10*3/uL (ref 150–400)
RBC: 3.24 MIL/uL — ABNORMAL LOW (ref 3.87–5.11)
RBC: 2.98 MIL/uL — ABNORMAL LOW (ref 3.87–5.11)
RBC: 3.26 MIL/uL — ABNORMAL LOW (ref 3.87–5.11)
RDW: 16.4 % — ABNORMAL HIGH (ref 11.5–15.5)
RDW: 17.9 % — ABNORMAL HIGH (ref 11.5–15.5)
RDW: 18.9 % — ABNORMAL HIGH (ref 11.5–15.5)
WBC: 10 10*3/uL (ref 4.0–10.5)
WBC: 14.8 10*3/uL — ABNORMAL HIGH (ref 4.0–10.5)
WBC: 16 10*3/uL — ABNORMAL HIGH (ref 4.0–10.5)
nRBC: 0 % (ref 0.0–0.2)
nRBC: 0 % (ref 0.0–0.2)
nRBC: 0 % (ref 0.0–0.2)

## 2022-11-30 LAB — POCT I-STAT, CHEM 8
BUN: 12 mg/dL (ref 8–23)
BUN: 12 mg/dL (ref 8–23)
BUN: 13 mg/dL (ref 8–23)
BUN: 12 mg/dL (ref 8–23)
Calcium, Ion: 1.26 mmol/L (ref 1.15–1.40)
Calcium, Ion: 0.99 mmol/L — ABNORMAL LOW (ref 1.15–1.40)
Calcium, Ion: 1.26 mmol/L (ref 1.15–1.40)
Calcium, Ion: 1.32 mmol/L (ref 1.15–1.40)
Chloride: 103 mmol/L (ref 98–111)
Chloride: 103 mmol/L (ref 98–111)
Chloride: 95 mmol/L — ABNORMAL LOW (ref 98–111)
Chloride: 100 mmol/L (ref 98–111)
Creatinine, Ser: 0.6 mg/dL (ref 0.44–1.00)
Creatinine, Ser: 0.6 mg/dL (ref 0.44–1.00)
Creatinine, Ser: 0.6 mg/dL (ref 0.44–1.00)
Creatinine, Ser: 0.7 mg/dL (ref 0.44–1.00)
Glucose, Bld: 226 mg/dL — ABNORMAL HIGH (ref 70–99)
Glucose, Bld: 152 mg/dL — ABNORMAL HIGH (ref 70–99)
Glucose, Bld: 204 mg/dL — ABNORMAL HIGH (ref 70–99)
Glucose, Bld: 216 mg/dL — ABNORMAL HIGH (ref 70–99)
HCT: 28 % — ABNORMAL LOW (ref 36.0–46.0)
HCT: 15 % — ABNORMAL LOW (ref 36.0–46.0)
HCT: 26 % — ABNORMAL LOW (ref 36.0–46.0)
HCT: 21 % — ABNORMAL LOW (ref 36.0–46.0)
Hemoglobin: 9.5 g/dL — ABNORMAL LOW (ref 12.0–15.0)
Hemoglobin: 5.1 g/dL — CL (ref 12.0–15.0)
Hemoglobin: 8.8 g/dL — ABNORMAL LOW (ref 12.0–15.0)
Hemoglobin: 7.1 g/dL — ABNORMAL LOW (ref 12.0–15.0)
Potassium: 3.7 mmol/L (ref 3.5–5.1)
Potassium: 3.9 mmol/L (ref 3.5–5.1)
Potassium: 6 mmol/L — ABNORMAL HIGH (ref 3.5–5.1)
Potassium: 4.6 mmol/L (ref 3.5–5.1)
Sodium: 138 mmol/L (ref 135–145)
Sodium: 126 mmol/L — ABNORMAL LOW (ref 135–145)
Sodium: 137 mmol/L (ref 135–145)
Sodium: 133 mmol/L — ABNORMAL LOW (ref 135–145)
TCO2: 23 mmol/L (ref 22–32)
TCO2: 22 mmol/L (ref 22–32)
TCO2: 23 mmol/L (ref 22–32)
TCO2: 24 mmol/L (ref 22–32)

## 2022-11-30 LAB — GLUCOSE, CAPILLARY
Glucose-Capillary: 222 mg/dL — ABNORMAL HIGH (ref 70–99)
Glucose-Capillary: 150 mg/dL — ABNORMAL HIGH (ref 70–99)
Glucose-Capillary: 140 mg/dL — ABNORMAL HIGH (ref 70–99)
Glucose-Capillary: 114 mg/dL — ABNORMAL HIGH (ref 70–99)
Glucose-Capillary: 125 mg/dL — ABNORMAL HIGH (ref 70–99)
Glucose-Capillary: 133 mg/dL — ABNORMAL HIGH (ref 70–99)
Glucose-Capillary: 126 mg/dL — ABNORMAL HIGH (ref 70–99)
Glucose-Capillary: 148 mg/dL — ABNORMAL HIGH (ref 70–99)
Glucose-Capillary: 144 mg/dL — ABNORMAL HIGH (ref 70–99)
Glucose-Capillary: 129 mg/dL — ABNORMAL HIGH (ref 70–99)
Glucose-Capillary: 123 mg/dL — ABNORMAL HIGH (ref 70–99)

## 2022-11-30 LAB — MAGNESIUM: Magnesium: 2.8 mg/dL — ABNORMAL HIGH (ref 1.7–2.4)

## 2022-11-30 LAB — PROTIME-INR
INR: 1.4 — ABNORMAL HIGH (ref 0.8–1.2)
Prothrombin Time: 17 s — ABNORMAL HIGH (ref 11.4–15.2)

## 2022-11-30 LAB — PLATELET COUNT: Platelets: 282 10*3/uL (ref 150–400)

## 2022-11-30 LAB — PREPARE RBC (CROSSMATCH)

## 2022-11-30 LAB — HEMOGLOBIN AND HEMATOCRIT, BLOOD
HCT: 16.9 % — ABNORMAL LOW (ref 36.0–46.0)
Hemoglobin: 5.3 g/dL — CL (ref 12.0–15.0)

## 2022-11-30 LAB — APTT: aPTT: 28 s (ref 24–36)

## 2022-11-30 SURGERY — CORONARY ARTERY BYPASS GRAFTING (CABG)
Anesthesia: General | Site: Chest

## 2022-11-30 MED ORDER — PROTAMINE SULFATE 10 MG/ML IV SOLN
INTRAVENOUS | Status: AC
  Filled 2022-11-30: qty 25

## 2022-11-30 MED ORDER — ACETAMINOPHEN 160 MG/5ML PO SOLN
1000.0000 mg | Freq: Four times a day (QID) | ORAL | Status: DC
Start: 2022-12-01 — End: 2022-11-30

## 2022-11-30 MED ORDER — FENTANYL CITRATE (PF) 250 MCG/5ML IJ SOLN
INTRAMUSCULAR | Status: AC
  Filled 2022-11-30: qty 5

## 2022-11-30 MED ORDER — METOPROLOL TARTRATE 12.5 MG HALF TABLET: 12.5000 mg | ORAL_TABLET | Freq: Two times a day (BID) | ORAL | Status: DC

## 2022-11-30 MED ORDER — SODIUM CHLORIDE 0.9 % IV SOLN
INTRAVENOUS | Status: AC
Start: 2022-11-30 — End: 2022-12-01

## 2022-11-30 MED ORDER — INSULIN REGULAR(HUMAN) IN NACL 100-0.9 UT/100ML-% IV SOLN
INTRAVENOUS | Status: DC
Start: 2022-11-30 — End: 2022-12-02
  Administered 2022-11-30: 2.6 [IU]/h via INTRAVENOUS
  Filled 2022-11-30: qty 100

## 2022-11-30 MED ORDER — GELATIN ABSORBABLE MT POWD
OROMUCOSAL | Status: DC | PRN
  Administered 2022-11-30: 2 via TOPICAL

## 2022-11-30 MED ORDER — VANCOMYCIN HCL IN DEXTROSE 1-5 GM/200ML-% IV SOLN
1000.0000 mg | Freq: Once | INTRAVENOUS | Status: AC
  Administered 2022-11-30: 1000 mg via INTRAVENOUS
  Filled 2022-11-30: qty 200

## 2022-11-30 MED ORDER — ROCURONIUM BROMIDE 10 MG/ML (PF) SYRINGE
PREFILLED_SYRINGE | INTRAVENOUS | Status: DC | PRN
  Administered 2022-11-30: 50 mg via INTRAVENOUS
  Administered 2022-11-30: 80 mg via INTRAVENOUS
  Administered 2022-11-30: 40 mg via INTRAVENOUS
  Administered 2022-11-30: 30 mg via INTRAVENOUS

## 2022-11-30 MED ORDER — ALBUMIN HUMAN 5 % IV SOLN
250.0000 mL | INTRAVENOUS | Status: DC | PRN
  Administered 2022-11-30 (×2): 12.5 g via INTRAVENOUS
  Filled 2022-11-30: qty 250

## 2022-11-30 MED ORDER — ALBUMIN HUMAN 5 % IV SOLN: INTRAVENOUS | Status: DC | PRN

## 2022-11-30 MED ORDER — PROTAMINE SULFATE 10 MG/ML IV SOLN
INTRAVENOUS | Status: DC | PRN
  Administered 2022-11-30: 20 mg via INTRAVENOUS
  Administered 2022-11-30: 340 mg via INTRAVENOUS

## 2022-11-30 MED ORDER — MORPHINE SULFATE (PF) 2 MG/ML IV SOLN
1.0000 mg | INTRAVENOUS | Status: DC | PRN
  Administered 2022-11-30 – 2022-12-01 (×4): 2 mg via INTRAVENOUS
  Filled 2022-11-30 (×4): qty 1

## 2022-11-30 MED ORDER — SODIUM CHLORIDE 0.45 % IV SOLN: INTRAVENOUS | Status: AC | PRN

## 2022-11-30 MED ORDER — BISACODYL 10 MG RE SUPP: 10.0000 mg | Freq: Every day | RECTAL | Status: DC

## 2022-11-30 MED ORDER — NITROGLYCERIN IN D5W 200-5 MCG/ML-% IV SOLN: 0.0000 ug/min | INTRAVENOUS | Status: DC

## 2022-11-30 MED ORDER — LACTATED RINGERS IV SOLN: INTRAVENOUS | Status: DC | PRN

## 2022-11-30 MED ORDER — SODIUM CHLORIDE 0.9% FLUSH: 3.0000 mL | INTRAVENOUS | Status: DC | PRN

## 2022-11-30 MED ORDER — MAGNESIUM SULFATE 4 GM/100ML IV SOLN
4.0000 g | Freq: Once | INTRAVENOUS | Status: AC
  Administered 2022-11-30: 4 g via INTRAVENOUS
  Filled 2022-11-30: qty 100

## 2022-11-30 MED ORDER — CHLORHEXIDINE GLUCONATE 0.12 % MT SOLN
15.0000 mL | OROMUCOSAL | Status: AC
  Administered 2022-11-30: 15 mL via OROMUCOSAL
  Filled 2022-11-30: qty 15

## 2022-11-30 MED ORDER — MIDAZOLAM HCL (PF) 10 MG/2ML IJ SOLN
INTRAMUSCULAR | Status: AC
  Filled 2022-11-30: qty 2

## 2022-11-30 MED ORDER — DOCUSATE SODIUM 100 MG PO CAPS
200.0000 mg | ORAL_CAPSULE | Freq: Every day | ORAL | Status: DC
  Administered 2022-12-01 – 2022-12-02 (×2): 200 mg via ORAL
  Filled 2022-11-30 (×4): qty 2

## 2022-11-30 MED ORDER — PHENYLEPHRINE 80 MCG/ML (10ML) SYRINGE FOR IV PUSH (FOR BLOOD PRESSURE SUPPORT)
PREFILLED_SYRINGE | INTRAVENOUS | Status: AC
  Filled 2022-11-30: qty 10

## 2022-11-30 MED ORDER — ACETAMINOPHEN 160 MG/5ML PO SOLN
650.0000 mg | Freq: Once | ORAL | Status: DC
Start: 2022-11-30 — End: 2022-11-30

## 2022-11-30 MED ORDER — HEPARIN SODIUM (PORCINE) 1000 UNIT/ML IJ SOLN
INTRAMUSCULAR | Status: AC
  Filled 2022-11-30: qty 10

## 2022-11-30 MED ORDER — HEPARIN SODIUM (PORCINE) 1000 UNIT/ML IJ SOLN
INTRAMUSCULAR | Status: DC | PRN
  Administered 2022-11-30: 36000 [IU] via INTRAVENOUS

## 2022-11-30 MED ORDER — LACTATED RINGERS IV SOLN
INTRAVENOUS | Status: AC
Start: 2022-11-30 — End: 2022-12-01

## 2022-11-30 MED ORDER — ONDANSETRON HCL 4 MG/2ML IJ SOLN: 4.0000 mg | Freq: Four times a day (QID) | INTRAMUSCULAR | Status: DC | PRN

## 2022-11-30 MED ORDER — MIDAZOLAM HCL 2 MG/2ML IJ SOLN: 2.0000 mg | INTRAMUSCULAR | Status: DC | PRN

## 2022-11-30 MED ORDER — HEPARIN SODIUM (PORCINE) 1000 UNIT/ML IJ SOLN
INTRAMUSCULAR | Status: AC
  Filled 2022-11-30: qty 1

## 2022-11-30 MED ORDER — DEXTROSE 50 % IV SOLN: 0.0000 mL | INTRAVENOUS | Status: DC | PRN

## 2022-11-30 MED ORDER — ASPIRIN 81 MG PO CHEW: 324.0000 mg | CHEWABLE_TABLET | Freq: Every day | ORAL | Status: DC

## 2022-11-30 MED ORDER — METOPROLOL TARTRATE 25 MG/10 ML ORAL SUSPENSION: 12.5000 mg | Freq: Two times a day (BID) | ORAL | Status: DC

## 2022-11-30 MED ORDER — ASPIRIN 325 MG PO TBEC: 325.0000 mg | DELAYED_RELEASE_TABLET | Freq: Every day | ORAL | Status: DC

## 2022-11-30 MED ORDER — 0.9 % SODIUM CHLORIDE (POUR BTL) OPTIME
TOPICAL | Status: DC | PRN
  Administered 2022-11-30: 5000 mL

## 2022-11-30 MED ORDER — SODIUM CHLORIDE 0.9 % IV SOLN
250.0000 mL | INTRAVENOUS | Status: AC
Start: 2022-12-01 — End: 2022-12-02

## 2022-11-30 MED ORDER — PHENYLEPHRINE HCL-NACL 20-0.9 MG/250ML-% IV SOLN: 0.0000 ug/min | INTRAVENOUS | Status: DC

## 2022-11-30 MED ORDER — CHLORHEXIDINE GLUCONATE CLOTH 2 % EX PADS
6.0000 | MEDICATED_PAD | Freq: Every day | CUTANEOUS | Status: DC
  Administered 2022-11-30 – 2022-12-02 (×3): 6 via TOPICAL

## 2022-11-30 MED ORDER — TRAMADOL HCL 50 MG PO TABS
50.0000 mg | ORAL_TABLET | ORAL | Status: DC | PRN
  Administered 2022-11-30 – 2022-12-01 (×2): 100 mg via ORAL
  Filled 2022-11-30 (×2): qty 2

## 2022-11-30 MED ORDER — PROTAMINE SULFATE 10 MG/ML IV SOLN
INTRAVENOUS | Status: AC
  Filled 2022-11-30: qty 10

## 2022-11-30 MED ORDER — PROPOFOL 10 MG/ML IV BOLUS
INTRAVENOUS | Status: AC
  Filled 2022-11-30: qty 20

## 2022-11-30 MED ORDER — NICARDIPINE HCL IN NACL 20-0.86 MG/200ML-% IV SOLN: 0.0000 mg/h | INTRAVENOUS | Status: DC

## 2022-11-30 MED ORDER — FENTANYL CITRATE (PF) 100 MCG/2ML IJ SOLN
INTRAMUSCULAR | Status: DC | PRN
  Administered 2022-11-30: 100 ug via INTRAVENOUS
  Administered 2022-11-30: 150 ug via INTRAVENOUS
  Administered 2022-11-30: 100 ug via INTRAVENOUS
  Administered 2022-11-30: 50 ug via INTRAVENOUS
  Administered 2022-11-30 (×5): 100 ug via INTRAVENOUS

## 2022-11-30 MED ORDER — METOCLOPRAMIDE HCL 5 MG/ML IJ SOLN
10.0000 mg | Freq: Four times a day (QID) | INTRAMUSCULAR | Status: AC
  Administered 2022-11-30 – 2022-12-01 (×6): 10 mg via INTRAVENOUS
  Filled 2022-11-30 (×6): qty 2

## 2022-11-30 MED ORDER — DEXMEDETOMIDINE HCL IN NACL 400 MCG/100ML IV SOLN: 0.0000 ug/kg/h | INTRAVENOUS | Status: DC

## 2022-11-30 MED ORDER — PANTOPRAZOLE SODIUM 40 MG IV SOLR
40.0000 mg | Freq: Every day | INTRAVENOUS | Status: DC
  Administered 2022-11-30: 40 mg via INTRAVENOUS
  Filled 2022-11-30: qty 10

## 2022-11-30 MED ORDER — POTASSIUM CHLORIDE 10 MEQ/50ML IV SOLN: 10.0000 meq | INTRAVENOUS | Status: AC

## 2022-11-30 MED ORDER — ACETAMINOPHEN 500 MG PO TABS
1000.0000 mg | ORAL_TABLET | Freq: Four times a day (QID) | ORAL | Status: DC
  Administered 2022-11-30 – 2022-12-04 (×12): 1000 mg via ORAL
  Filled 2022-11-30 (×13): qty 2

## 2022-11-30 MED ORDER — LACTATED RINGERS IV SOLN: INTRAVENOUS | Status: AC

## 2022-11-30 MED ORDER — ASPIRIN 81 MG PO CHEW
324.0000 mg | CHEWABLE_TABLET | Freq: Once | ORAL | Status: DC
Start: 2022-11-30 — End: 2022-11-30

## 2022-11-30 MED ORDER — OXYCODONE HCL 5 MG PO TABS
5.0000 mg | ORAL_TABLET | ORAL | Status: DC | PRN
  Administered 2022-11-30 – 2022-12-01 (×2): 10 mg via ORAL
  Administered 2022-12-02: 5 mg via ORAL
  Filled 2022-11-30 (×3): qty 2

## 2022-11-30 MED ORDER — ASPIRIN 81 MG PO CHEW
324.0000 mg | CHEWABLE_TABLET | Freq: Once | ORAL | Status: DC
  Filled 2022-11-30: qty 4

## 2022-11-30 MED ORDER — SODIUM CHLORIDE 0.9% IV SOLUTION
Freq: Once | INTRAVENOUS | Status: DC
Start: 2022-11-30 — End: 2022-11-30

## 2022-11-30 MED ORDER — PHENYLEPHRINE 80 MCG/ML (10ML) SYRINGE FOR IV PUSH (FOR BLOOD PRESSURE SUPPORT)
PREFILLED_SYRINGE | INTRAVENOUS | Status: DC | PRN
  Administered 2022-11-30: 80 ug via INTRAVENOUS
  Administered 2022-11-30: 160 ug via INTRAVENOUS
  Administered 2022-11-30 (×2): 80 ug via INTRAVENOUS

## 2022-11-30 MED ORDER — SODIUM CHLORIDE 0.9% FLUSH
3.0000 mL | Freq: Two times a day (BID) | INTRAVENOUS | Status: DC
  Administered 2022-12-01 – 2022-12-04 (×7): 3 mL via INTRAVENOUS

## 2022-11-30 MED ORDER — BISACODYL 5 MG PO TBEC
10.0000 mg | DELAYED_RELEASE_TABLET | Freq: Every day | ORAL | Status: DC
  Administered 2022-12-01 – 2022-12-02 (×2): 10 mg via ORAL
  Filled 2022-11-30 (×3): qty 2

## 2022-11-30 MED ORDER — MIDAZOLAM HCL 5 MG/5ML IJ SOLN
INTRAMUSCULAR | Status: DC | PRN
  Administered 2022-11-30: 2 mg via INTRAVENOUS
  Administered 2022-11-30: 4 mg via INTRAVENOUS

## 2022-11-30 MED ORDER — ROCURONIUM BROMIDE 10 MG/ML (PF) SYRINGE
PREFILLED_SYRINGE | INTRAVENOUS | Status: AC
  Filled 2022-11-30: qty 20

## 2022-11-30 MED ORDER — METOPROLOL TARTRATE 5 MG/5ML IV SOLN: 2.5000 mg | INTRAVENOUS | Status: DC | PRN

## 2022-11-30 MED ORDER — PANTOPRAZOLE SODIUM 40 MG PO TBEC
40.0000 mg | DELAYED_RELEASE_TABLET | Freq: Every day | ORAL | Status: DC
Start: 2022-12-02 — End: 2022-11-30

## 2022-11-30 MED ORDER — PROPOFOL 10 MG/ML IV BOLUS
INTRAVENOUS | Status: DC | PRN
  Administered 2022-11-30: 20 mg via INTRAVENOUS
  Administered 2022-11-30: 50 mg via INTRAVENOUS

## 2022-11-30 MED ORDER — PLASMA-LYTE A IV SOLN
INTRAVENOUS | Status: DC | PRN
  Administered 2022-11-30: 500 mL via INTRAVASCULAR

## 2022-11-30 MED ORDER — CEFAZOLIN SODIUM-DEXTROSE 2-4 GM/100ML-% IV SOLN
2.0000 g | Freq: Three times a day (TID) | INTRAVENOUS | Status: AC
  Administered 2022-11-30 – 2022-12-02 (×6): 2 g via INTRAVENOUS
  Filled 2022-11-30 (×6): qty 100

## 2022-11-30 SURGICAL SUPPLY — 79 items
ANTIFOG SOL W/FOAM PAD STRL (MISCELLANEOUS) ×2
BAG DECANTER FOR FLEXI CONT (MISCELLANEOUS) ×3 IMPLANT
BLADE STERNUM SYSTEM 6 (BLADE) ×3 IMPLANT
BLADE SURG 11 STRL SS (BLADE) IMPLANT
BNDG ELASTIC 4INX 5YD STR LF (GAUZE/BANDAGES/DRESSINGS) IMPLANT
BNDG ELASTIC 6INX 5YD STR LF (GAUZE/BANDAGES/DRESSINGS) IMPLANT
BNDG GAUZE DERMACEA FLUFF 4 (GAUZE/BANDAGES/DRESSINGS) ×3 IMPLANT
CABLE SURGICAL S-101-97-12 (CABLE) ×3 IMPLANT
CANISTER SUCT 3000ML PPV (MISCELLANEOUS) ×3 IMPLANT
CANNULA MC2 2 STG 29/37 NON-V (CANNULA) ×3 IMPLANT
CANNULA NON VENT 20FR 12 (CANNULA) ×3 IMPLANT
CATH ROBINSON RED A/P 18FR (CATHETERS) ×6 IMPLANT
CLIP RETRACTION 3.0MM CORONARY (MISCELLANEOUS) IMPLANT
CLIP TI MEDIUM 24 (CLIP) IMPLANT
CLIP TI WIDE RED SMALL 24 (CLIP) IMPLANT
CONN ST 1/2X1/2 BEN (MISCELLANEOUS) ×3 IMPLANT
CONNECTOR BLAKE 2:1 CARIO BLK (MISCELLANEOUS) ×3 IMPLANT
CONTAINER PROTECT SURGISLUSH (MISCELLANEOUS) ×6 IMPLANT
DERMABOND ADVANCED .7 DNX12 (GAUZE/BANDAGES/DRESSINGS) IMPLANT
DRAIN CHANNEL 19F RND (DRAIN) ×9 IMPLANT
DRAIN CONNECTOR BLAKE 1:1 (MISCELLANEOUS) ×3 IMPLANT
DRAPE INCISE IOBAN 66X45 STRL (DRAPES) IMPLANT
DRAPE SRG 135X102X78XABS (DRAPES) ×3 IMPLANT
DRAPE WARM FLUID 44X44 (DRAPES) ×3 IMPLANT
DRSG AQUACEL AG ADV 3.5X10 (GAUZE/BANDAGES/DRESSINGS) ×3 IMPLANT
ELECT BLADE 4.0 EZ CLEAN MEGAD (MISCELLANEOUS) ×2
ELECT REM PT RETURN 9FT ADLT (ELECTROSURGICAL) ×4
ELECTRODE BLDE 4.0 EZ CLN MEGD (MISCELLANEOUS) ×3 IMPLANT
ELECTRODE REM PT RTRN 9FT ADLT (ELECTROSURGICAL) ×6 IMPLANT
FELT TEFLON 1X6 (MISCELLANEOUS) ×6 IMPLANT
GAUZE 4X4 16PLY ~~LOC~~+RFID DBL (SPONGE) ×3 IMPLANT
GAUZE SPONGE 4X4 12PLY STRL (GAUZE/BANDAGES/DRESSINGS) ×6 IMPLANT
GLOVE BIO SURGEON STRL SZ 6.5 (GLOVE) IMPLANT
GLOVE BIO SURGEON STRL SZ7 (GLOVE) ×6 IMPLANT
GLOVE BIOGEL M STRL SZ7.5 (GLOVE) ×6 IMPLANT
GLOVE BIOGEL PI IND STRL 9 (GLOVE) IMPLANT
GOWN STRL REUS W/ TWL LRG LVL3 (GOWN DISPOSABLE) ×12 IMPLANT
GOWN STRL REUS W/ TWL XL LVL3 (GOWN DISPOSABLE) ×6 IMPLANT
HEMOSTAT POWDER SURGIFOAM 1G (HEMOSTASIS) ×6 IMPLANT
INSERT SUTURE HOLDER (MISCELLANEOUS) ×3 IMPLANT
KIT BASIN OR (CUSTOM PROCEDURE TRAY) ×3 IMPLANT
KIT SUCTION CATH 14FR (SUCTIONS) ×3 IMPLANT
KIT TURNOVER KIT B (KITS) ×3 IMPLANT
KIT VASOVIEW HEMOPRO 2 VH 4000 (KITS) ×3 IMPLANT
LEAD PACING MYOCARDI (MISCELLANEOUS) ×3 IMPLANT
MARKER GRAFT CORONARY BYPASS (MISCELLANEOUS) ×9 IMPLANT
NS IRRIG 1000ML POUR BTL (IV SOLUTION) ×15 IMPLANT
PACK E OPEN HEART (SUTURE) ×3 IMPLANT
PACK OPEN HEART (CUSTOM PROCEDURE TRAY) ×3 IMPLANT
PAD ARMBOARD 7.5X6 YLW CONV (MISCELLANEOUS) ×6 IMPLANT
PAD ELECT DEFIB RADIOL ZOLL (MISCELLANEOUS) ×3 IMPLANT
PENCIL BUTTON HOLSTER BLD 10FT (ELECTRODE) ×3 IMPLANT
POSITIONER HEAD DONUT 9IN (MISCELLANEOUS) ×3 IMPLANT
PUNCH AORTIC ROTATE 4.0MM (MISCELLANEOUS) ×3 IMPLANT
SET MPS 3-ND DEL (MISCELLANEOUS) IMPLANT
SOLUTION ANTFG W/FOAM PAD STRL (MISCELLANEOUS) IMPLANT
SPONGE T-LAP 18X18 ~~LOC~~+RFID (SPONGE) ×12 IMPLANT
SUPPORT HEART JANKE-BARRON (MISCELLANEOUS) ×3 IMPLANT
SUT BONE WAX W31G (SUTURE) ×3 IMPLANT
SUT ETHIBOND X763 2 0 SH 1 (SUTURE) ×6 IMPLANT
SUT MNCRL AB 3-0 PS2 18 (SUTURE) ×6 IMPLANT
SUT MNCRL AB 4-0 PS2 18 (SUTURE) IMPLANT
SUT PDS AB 1 CTX 36 (SUTURE) ×6 IMPLANT
SUT PROLENE 4 0 SH DA (SUTURE) ×3 IMPLANT
SUT PROLENE 4-0 RB1 .5 CRCL 36 (SUTURE) IMPLANT
SUT PROLENE 5 0 C 1 36 (SUTURE) ×9 IMPLANT
SUT PROLENE 7 0 BV1 MDA (SUTURE) ×3 IMPLANT
SUT STEEL 6MS V (SUTURE) ×6 IMPLANT
SUT STEEL STERNAL CCS#1 18IN (SUTURE) IMPLANT
SUT VIC AB 2-0 CT1 TAPERPNT 27 (SUTURE) IMPLANT
SYSTEM SAHARA CHEST DRAIN ATS (WOUND CARE) ×3 IMPLANT
TAPE CLOTH SURG 4X10 WHT LF (GAUZE/BANDAGES/DRESSINGS) IMPLANT
TAPE PAPER 2X10 WHT MICROPORE (GAUZE/BANDAGES/DRESSINGS) IMPLANT
TOWEL GREEN STERILE (TOWEL DISPOSABLE) ×3 IMPLANT
TOWEL GREEN STERILE FF (TOWEL DISPOSABLE) ×3 IMPLANT
TRAY FOLEY SLVR 16FR TEMP STAT (SET/KITS/TRAYS/PACK) ×3 IMPLANT
TUBING LAP HI FLOW INSUFFLATIO (TUBING) ×3 IMPLANT
UNDERPAD 30X36 HEAVY ABSORB (UNDERPADS AND DIAPERS) ×3 IMPLANT
WATER STERILE IRR 1000ML POUR (IV SOLUTION) ×6 IMPLANT

## 2022-11-30 NOTE — Anesthesia Preprocedure Evaluation (Signed)
Anesthesia Evaluation  Patient identified by MRN, date of birth, ID band Patient awake    Reviewed: Allergy & Precautions, NPO status , Patient's Chart, lab work & pertinent test results  Airway Mallampati: II  TM Distance: >3 FB Neck ROM: Full    Dental  (+) Dental Advisory Given   Pulmonary former smoker   breath sounds clear to auscultation       Cardiovascular hypertension, Pt. on medications + angina  + Past MI  + Valvular Problems/Murmurs  Rhythm:Regular Rate:Normal     Neuro/Psych    GI/Hepatic negative GI ROS, Neg liver ROS,,,  Endo/Other  diabetes, Type 2    Renal/GU negative Renal ROS     Musculoskeletal   Abdominal   Peds  Hematology  (+) Blood dyscrasia, anemia   Anesthesia Other Findings   Reproductive/Obstetrics                             Anesthesia Physical Anesthesia Plan  ASA: 4  Anesthesia Plan: General   Post-op Pain Management:    Induction: Intravenous  PONV Risk Score and Plan: 3 and Dexamethasone, Ondansetron and Treatment may vary due to age or medical condition  Airway Management Planned: Oral ETT  Additional Equipment: Arterial line, CVP, TEE and Ultrasound Guidance Line Placement  Intra-op Plan:   Post-operative Plan: Post-operative intubation/ventilation  Informed Consent: I have reviewed the patients History and Physical, chart, labs and discussed the procedure including the risks, benefits and alternatives for the proposed anesthesia with the patient or authorized representative who has indicated his/her understanding and acceptance.       Plan Discussed with: CRNA  Anesthesia Plan Comments:        Anesthesia Quick Evaluation

## 2022-11-30 NOTE — Consult Note (Addendum)
NAME:  Catherine Moon, MRN:  [id number], DOB:  [birth year], LOS: 5 ADMISSION DATE:  11/25/2022, CONSULTATION DATE:  11/26 REFERRING MD:  Nat Christen, CHIEF COMPLAINT:  s/p CABG   History of Present Illness:  62 year old female with past medical history of hypertension, hyperlipidemia, type 2 diabetes who presented to the emergency department on 11/25/22 with complaint of chest pain. Initially described as intermittent, exertional pain with radiation to the neck and shoulders. Troponins at that time 138>140. No EKG changes. She was admitted to hospitalist for NSTEMI. Cardiology evaluated the patient, recommending cardiac cath. Cath showed 70% distal left main stenosis, severe disease of small non-dominant RCA. CABG was recommended and cardiothoracic surgery was consulted.   11/26: CABG x2 LIMA LAD, RSVG PLV Cross clamp time 32 minutes Pump time 62 minutes 1U PRBC PCCM consulted for post-operative management  Pertinent  Medical History  hypertension, hyperlipidemia, type 2 diabetes  Significant Hospital Events: Including procedures, antibiotic start and stop dates in addition to other pertinent events   11/21: admit for NSTEMI  11/22: cath with 70% distal left main stenosis, severe small non-dominant RCA. Echo LVEF 55-60%, bicuspid aortic valve, trivial regurgitation, no stenosis  11/26: s/p CABG x2   Interim History / Subjective:  From OR to ICU s/p CABG x2. Intubated. Sedated on dex. On 25 neo. Just received albumin.   Objective   Blood pressure (!) 126/59, pulse 74, temperature 98.7 F (37.1 C), temperature source Oral, resp. rate 19, height 5\' 6"  (1.676 m), weight 104 kg, SpO2 97%.        Intake/Output Summary (Last 24 hours) at 11/30/2022 4696 Last data filed at 11/30/2022 0825 Gross per 24 hour  Intake 3 ml  Output 100 ml  Net -97 ml   Filed Weights   11/28/22 0612 11/29/22 0434 11/30/22 0513  Weight: 105 kg 105.2 kg 104 kg    Examination: General: obese  female, intubated, sedated, no acute distress HENT: normocephalic, atraumatic, mucous membranes dry, ETT Lungs: vented breath sounds, equal chest rise and fall Cardiovascular: midline surgical incision with dressing CDI Abdomen: rounded, soft  Extremities: BLE pitting edema, extremities warm  Neuro: sedated  GU: foley   Resolved Hospital Problem list     Assessment & Plan:  CAD s/p CABG x2 LIMA LAD, RSVG PLV; cross clamp times 32 minutes, pump time 62 minutes.  NSTEMI  - post op management per TCTS  - chest tube, wires per TCTS  - con't asa 81mg  daily  - cardene, nitroglycerin gtt ordered if needed per TCTS - wean neosynephrine as able SBP 90-150, MAP 70-90, HR 60-130. Wean and discontinue within 8 hours if CI >1.8, SBP >90, and/or MAP >70 - pain control with tramadol, oxycodone, morphine - PT/OT/IS  - GDMT when clinically appropriate   Postoperative vent management  - SAT/SBT when able, okay to rapid wean and extubate per TCTS.  - lung protective ventilation 6-8cc/kg Vt - wean precedex gtt as able  - titrate FiO2 to sat goal >92  - maintain peak/plats <30, driving pressures <29    Hypertension  - hold home antihypertensives until off neo  - when clinically appropriate, resume home metoprolol, hydrochlorothiazide  Hyperlipidemia  - atorvastatin 80mg  daily  - zetia 10mg  daily   Diabetes; A1c 7.5 - con't insulin gtt per protocol  - transition to SQ SSI and long acting coverage when weaned off   B12 deficiency  Iron deficiency  - B12 daily IM  - ferrous sulfate 325mg  daily   Best  Practice (right click and "Reselect all SmartList Selections" daily)   Diet/type: NPO DVT prophylaxis: prophylactic heparin  Pressure ulcer(s). pressure ulcer stage none was NOT present on admission GI prophylaxis: PPI Lines: Central line, Arterial Line, and yes and it is still needed Foley:  Yes, and it is still needed Code Status:  full code Last date of multidisciplinary goals of care  discussion []   Labs   CBC: Recent Labs  Lab 11/25/22 1533 11/26/22 0619 11/29/22 0446 11/30/22 0446 11/30/22 0757 11/30/22 0801  WBC 14.3* 12.4* 12.0* 10.0  --   --   HGB 7.9* 8.5* 10.3* 10.1* 9.2* 9.5*  HCT 25.2* 26.9* 31.9* 31.6* 27.0* 28.0*  MCV 102.0* 99.3 98.8 97.5  --   --   PLT 439* 417* 490* 427*  --   --     Basic Metabolic Panel: Recent Labs  Lab 11/25/22 1533 11/26/22 0619 11/29/22 0446 11/30/22 0446 11/30/22 0757 11/30/22 0801  NA 135 135 136 136 138 138  K 3.9 3.9 4.0 3.9 3.7 3.7  CL 103 103 106 103  --  103  CO2 23 22 23 25   --   --   GLUCOSE 316* 281* 168* 242*  --  226*  BUN 10 9 14 13   --  12  CREATININE 0.88 0.67 0.80 0.78  --  0.60  CALCIUM 9.5 9.1 9.7 9.5  --   --   MG  --  1.9  --   --   --   --   PHOS  --  4.5  --   --   --   --    GFR: Estimated Creatinine Clearance: 90 mL/min (by C-G formula based on SCr of 0.6 mg/dL). Recent Labs  Lab 11/25/22 1533 11/26/22 0619 11/29/22 0446 11/30/22 0446  WBC 14.3* 12.4* 12.0* 10.0    Liver Function Tests: No results for input(s): "AST", "ALT", "ALKPHOS", "BILITOT", "PROT", "ALBUMIN" in the last 168 hours. No results for input(s): "LIPASE", "AMYLASE" in the last 168 hours. No results for input(s): "AMMONIA" in the last 168 hours.  ABG    Component Value Date/Time   PHART 7.310 (L) 11/30/2022 0757   PCO2ART 44.6 11/30/2022 0757   PO2ART 299 (H) 11/30/2022 0757   HCO3 22.5 11/30/2022 0757   TCO2 23 11/30/2022 0801   ACIDBASEDEF 4.0 (H) 11/30/2022 0757   O2SAT 100 11/30/2022 0757     Coagulation Profile: Recent Labs  Lab 11/26/22 0619  INR 1.0    Cardiac Enzymes: No results for input(s): "CKTOTAL", "CKMB", "CKMBINDEX", "TROPONINI" in the last 168 hours.  HbA1C: Hemoglobin A1C  Date/Time Value Ref Range Status  05/04/2022 04:08 PM 8.9 (A) 4.0 - 5.6 % Final  12/18/2021 02:40 PM 9.6 (A) 4.0 - 5.6 % Final   Hgb A1c MFr Bld  Date/Time Value Ref Range Status  11/26/2022 06:19 AM  7.5 (H) 4.8 - 5.6 % Final    Comment:    (NOTE) Pre diabetes:          5.7%-6.4%  Diabetes:              >6.4%  Glycemic control for   <7.0% adults with diabetes   12/10/2020 02:54 PM 14.4 (H) 4.6 - 6.5 % Final    Comment:    Glycemic Control Guidelines for People with Diabetes:Non Diabetic:  <6%Goal of Therapy: <7%Additional Action Suggested:  >8%     CBG: Recent Labs  Lab 11/29/22 0752 11/29/22 1206 11/29/22 1609 11/29/22 2119 11/30/22 1610  GLUCAP  176* 197* 249* 237* 222*    Review of Systems:   As above  Past Medical History:  She,  has a past medical history of CTS (carpal tunnel syndrome), DM (diabetes mellitus) (HCC), Dyslipidemia, Heart murmur, HTN (hypertension), Hypercholesterolemia, Seasonal allergies, Smoker, and Warts, genital.   Surgical History:   Past Surgical History:  Procedure Laterality Date   COLONOSCOPY     CYST EXCISION Right 03/26/2021   Procedure: RIGHT AXILLARY SEBACEOUS CYST EXCISION;  Surgeon: Emelia Loron, MD;  Location: [facility];  Service: General;  Laterality: Right;  LOCAL   DILATION AND CURETTAGE OF UTERUS     LEFT HEART CATH AND CORONARY ANGIOGRAPHY N/A 11/26/2022   Procedure: LEFT HEART CATH AND CORONARY ANGIOGRAPHY;  Surgeon: Iran Ouch, MD;  Location: [facility];  Service: Cardiovascular;  Laterality: N/A;   WISDOM TOOTH EXTRACTION       Social History:   reports that she quit smoking about 15 years ago. Her smoking use included cigarettes. She started smoking about 41 years ago. She has a 26 pack-year smoking history. She has never used smokeless tobacco. She reports current alcohol use. She reports that she does not use drugs.   Family History:  Her family history includes Breast cancer in her mother; Cancer in her mother; Colon cancer in her paternal grandmother; Coronary artery disease in her father; Heart disease in her father and mother.   Allergies No Known Allergies   Home  Medications  Prior to Admission medications   Medication Sig Start Date End Date Taking? Authorizing Provider  acetaminophen (TYLENOL) 500 MG tablet Take 1,000 mg by mouth every 6 (six) hours as needed for mild pain, moderate pain, fever or headache.   Yes [provider]  aspirin 81 MG EC tablet Take 81 mg by mouth daily.   Yes [provider]  atorvastatin (LIPITOR) 80 MG tablet Take 1 tablet (80 mg total) by mouth daily. Due for physical. 10/01/22  Yes [place], Betty G, MD  calcium carbonate (OSCAL) 1500 (600 Ca) MG TABS tablet Take 1,200 mg of elemental calcium by mouth daily with breakfast.   Yes [provider]  ezetimibe (ZETIA) 10 MG tablet Take 1 tablet (10 mg total) by mouth daily. 09/20/22  Yes Carlus Pavlov, MD  ferrous sulfate 325 (65 FE) MG tablet Take 325 mg by mouth daily with breakfast.   Yes [provider]  Fexofenadine HCl (ALLEGRA PO) Take 120 mg by mouth daily.   Yes [provider]  insulin regular human CONCENTRATED (HUMULIN R U-500 KWIKPEN) 500 UNIT/ML KwikPen Inject 80-100 Units into the skin 3 (three) times daily before meals. Patient taking differently: Inject 100 Units into the skin 3 (three) times daily before meals. 09/14/22  Yes Carlus Pavlov, MD  lisinopril-hydrochlorothiazide (ZESTORETIC) 20-12.5 MG tablet TAKE 1 TABLET BY MOUTH EVERY DAY 05/24/22  Yes [place], Betty G, MD  Loratadine (CLARITIN PO) Take 10 mg by mouth daily.   Yes [provider]  magnesium oxide (MAG-OX) 400 MG tablet Take 400 mg by mouth daily.   Yes [provider]  megestrol (MEGACE) 40 MG tablet Take 40 mg by mouth daily. TAKE ONE TABLET BY MOUTH TWO TIMES A DAY FOR 3 DAYS AND THEN 1 TABLET DAILY FOR 15 DAYS 11/22/22  Yes [provider]  Multiple Vitamin (MULTIVITAMIN WITH MINERALS) TABS tablet Take 1 tablet by mouth daily.   Yes [provider]  Potassium 99 MG TABS Take 1 tablet by mouth  daily.   Yes [provider]  tirzepatide Greggory Keen) 10 MG/0.5ML Pen Inject 10 mg into the skin once a week. Patient taking differently: Inject 10 mg into the skin once a week. Thursday 09/14/22  Yes Carlus Pavlov, MD  zinc gluconate 50 MG tablet Take 50 mg by mouth daily.   Yes [provider]  Continuous Glucose Sensor (FREESTYLE LIBRE 3 SENSOR) MISC Place 1 sensor on the skin every 14 days. Use to check glucose continuously 09/14/22   Carlus Pavlov, MD  Insulin Pen Needle 32G X 4 MM MISC Use 4x a day 05/18/21   Carlus Pavlov, MD     Critical care time: 41    Lenard Galloway [facility] 11/30/22 11:56 AM  Please see [email or web address] for pager details.  From 7A-7P if no response, please call [phone] After hours, please call ELink [phone]

## 2022-11-30 NOTE — Anesthesia Postprocedure Evaluation (Signed)
Anesthesia Post Note  Patient: Glenita Lammie Egli  Procedure(s) Performed: CORONARY ARTERY BYPASS GRAFTING X 2 , USING LEFT INTERNAL MAMMARY ARTERY AND ENDOSCOPICALLY HARVESTED RIGHT SAPHENOUS VEIN GRAFT (Chest) TRANSESOPHAGEAL ECHOCARDIOGRAM (TEE)     Patient location during evaluation: SICU Anesthesia Type: General Level of consciousness: sedated Pain management: pain level controlled Vital Signs Assessment: post-procedure vital signs reviewed and stable Respiratory status: patient remains intubated per anesthesia plan Cardiovascular status: stable Postop Assessment: no apparent nausea or vomiting Anesthetic complications: no   No notable events documented.  Last Vitals:  Vitals:   11/30/22 1300 11/30/22 1400  BP: (!) 109/53 (!) 97/51  Pulse: 87 86  Resp: 18 18  Temp: 37.1 C 37.2 C  SpO2: 99% 100%    Last Pain:  Vitals:   11/30/22 1600  TempSrc:   PainSc: 7                  Kennieth Rad

## 2022-11-30 NOTE — Hospital Course (Addendum)
HPI:  Catherine Moon is a 62 yo woman with multiple cardiac risk factors including insulin dependent DM, hyperlipidemia, hypertension, hx of smoking and obesity.  She has known mild AS.  She presented to the ED with 6 day history of stuttering CP.  Finally this did not resolve and became severe 10/10 pain.  She was rule in for non STEMI.  She underwent cardiac cath which revealed a 70% left main stenosis in a left dominant circulation.  EF 50-55%.  LVEDP elevated at 33 mmHg. Echocardiogram showed LVEF 55-60%, a functionally bicuspid aortic valve with trivial aortic valve regurgitation, no evidence of aortic stenosis, and trivial mitral valve regurgitation.   Dr. Cliffton Asters and Dr. Dorris Fetch reviewed the patient's diagnostic studies and determined she would benefit from surgical intervention. The treatment options as well as the risks and benefits of surgery were reviewed with the patient. Catherine Moon was agreeable to proceed with surgery.   Hospital Course: Catherine Moon was admitted to [facility] and remained in stable condition. She was brought to the operating room on 11/30/22. She underwent CABG x 2 utilizing LIMA to LAD and SVG to OM as well as endoscopic harvest of the right greater saphenous vein. She tolerated the procedure well and was transferred to the SICU in stable condition. She was extubated the afternoon of surgery without complication. Drips were weaned as hemodynamics tolerated on POD1, arterial line was removed without complication. Epicardial wires were removed without complication. She was started on Plavix for prior NSTEMI. She had a history of chronic anemia with B12 and iron deficiency, IM B12 daily and ferrous sulfate 325mg  daily was started. She was routinely diuresed. She was felt stable for transfer to the progressive unit. She was saturating well on room air. Norvasc was added for improved blood pressure control. She was deconditioned, PT/OT recommended a rolling walker and  bedside commode which were ordered. Her ambulation was progressing well on room air. She has a history of chronic leukocytosis and was recommended to follow up with her PCP. She has severe insulin resistance and was recommended to continue close follow up with her endocrinologist. Her incisions were healing well without sign of infection. She was felt stable for discharge.

## 2022-11-30 NOTE — Transfer of Care (Signed)
Immediate Anesthesia Transfer of Care Note  Patient: Deshon Hovsepian  Procedure(s) Performed: CORONARY ARTERY BYPASS GRAFTING X 2 , USING LEFT INTERNAL MAMMARY ARTERY AND ENDOSCOPICALLY HARVESTED RIGHT SAPHENOUS VEIN GRAFT (Chest) TRANSESOPHAGEAL ECHOCARDIOGRAM (TEE)  Patient Location: ICU  Anesthesia Type:General  Level of Consciousness: Patient remains intubated per anesthesia plan  Airway & Oxygen Therapy: Patient remains intubated per anesthesia plan and Patient placed on Ventilator (see vital sign flow sheet for setting)  Post-op Assessment: Report given to RN and Post -op Vital signs reviewed and stable  Post vital signs: Reviewed and stable  Last Vitals:  Vitals Value Taken Time  BP 112/84 1210  Temp    Pulse 74 11/30/22 1212  Resp 17 11/30/22 1212  SpO2 98 % 11/30/22 1212  Vitals shown include unfiled device data.  Last Pain:  Vitals:   11/30/22 0513  TempSrc: Oral  PainSc:       Patients Stated Pain Goal: 0 (11/29/22 2100)  Complications: No notable events documented.

## 2022-11-30 NOTE — Brief Op Note (Signed)
11/25/2022 - 11/30/2022  12:16 PM  PATIENT:  Catherine Moon  62 y.o. female  PRE-OPERATIVE DIAGNOSIS:  CORONARY ARTERY DISEASE  POST-OPERATIVE DIAGNOSIS:  CORONARY ARTERY DISEASE  PROCEDURE:  Procedure(s): CORONARY ARTERY BYPASS GRAFTING X 2 , USING LEFT INTERNAL MAMMARY ARTERY AND ENDOSCOPICALLY HARVESTED RIGHT SAPHENOUS VEIN GRAFT (N/A) TRANSESOPHAGEAL ECHOCARDIOGRAM (TEE) (N/A) Vein harvest time: Vein prep time: -LIMA to LAD -SVG to OM  SURGEON:  Surgeons and Role:    * [name], MD - Primary  PHYSICIAN ASSISTANT: Aloha Gell PA-C  ASSISTANTS: Virgilio Frees RNFA   ANESTHESIA:   general  EBL:  950 mL   BLOOD ADMINISTERED: 594cc PRBCs  DRAINS:  Mediastinal drains    LOCAL MEDICATIONS USED:  NONE  SPECIMEN:  No Specimen  DISPOSITION OF SPECIMEN:  N/A  COUNTS:  YES  DICTATION: .Dragon Dictation  PLAN OF CARE: Admit to inpatient   PATIENT DISPOSITION:  ICU - intubated and hemodynamically stable.   Delay start of Pharmacological VTE agent (>24hrs) due to surgical blood loss or risk of bleeding: yes

## 2022-11-30 NOTE — Discharge Instructions (Signed)

## 2022-11-30 NOTE — Progress Notes (Signed)
PROGRESS NOTE    Catherine Moon  OFB:[id number] DOB: 1960/10/08 DOA: 11/25/2022 PCP: [place], Betty G, MD   Brief Narrative:  Catherine Moon is an 62 y.o. female past medical history of essential hypertension, hyperlipidemia, diabetes former smoker comes in with chest pain and with severe changes in her blood pressure with a systolic greater than 200 and tachycardic in the 130s, she relates that her symptoms started about 4 days prior to admission with new exertional chest pain, resolved with rest episodes lasting about 15 minutes.  Assessment & Plan:   Principal Problem:   Unstable angina (HCC) Active Problems:   Hyperlipidemia associated with type 2 diabetes mellitus (HCC)   Essential hypertension   (HFpEF) heart failure with preserved ejection fraction (HCC)   NSTEMI (non-ST elevated myocardial infarction) (HCC)   Unstable angina/NSTEMI (non-ST elevated myocardial infarction) (HCC) CAD Functional bicuspid aortic valve Multiple risk factor with a classical history of exertional chest pain better with rest. Troponins minimally elevated EKG is unremarkable. Cardiology was consulted recommended against heparin. Started on aspirin, continue nitro as needed, metoprolol Per cardiac catheterization given 70% stenosis of the left main CABG Tuesday am 11/30/2022   Symptomatic macrocytic anemia: In the setting of heavy post-menopausal bleeding - followed by ObGyn. Continue B12 replacement Goal hemoglobin greater than 8   Hypertensive urgency: Pressure at the PCP was greater than 200. Was recently started on Megace which can cause increase in blood pressure. She was started on her home regimen her blood pressure this morning is significantly improved 128/55 Question if her chest pain was likely due to elevated blood pressure, would likely need ischemic workup.   Hyperlipidemia: Continue statins.   Diabetes mellitus type 2 with hyperglycemia: At home she is on  U-500 as well as Mounjaro.  DVT prophylaxis: heparin injection 5,000 Units Start: 11/26/22 0600 SCDs Start: 11/25/22 2307   Code Status:   Code Status: Full Code  Family Communication: At bedside  Status is: Inpatient  Dispo: The patient is from: Home              Anticipated d/c is to: Home              Anticipated d/c date is: 72+ hours              Patient currently not medically stable for discharge  Consultants:  Cardiology, CT surgery  Procedures:  Left heart cath 11/22 CABG planned 11/26  Antimicrobials:  None  Subjective: No acute issues or events overnight  Objective: Vitals:   11/30/22 0724 11/30/22 0725 11/30/22 0726 11/30/22 0727  BP:      Pulse: 72 70 71 74  Resp: 16 17 16 19   Temp:      TempSrc:      SpO2: 94% 95% 97% 97%  Weight:      Height:        Intake/Output Summary (Last 24 hours) at 11/30/2022 0848 Last data filed at 11/30/2022 0825 Gross per 24 hour  Intake 303 ml  Output 100 ml  Net 203 ml   Filed Weights   11/28/22 0612 11/29/22 0434 11/30/22 0513  Weight: 105 kg 105.2 kg 104 kg    Examination:  General:  Pleasantly resting in bed, No acute distress. HEENT:  Normocephalic atraumatic.  Sclerae nonicteric, noninjected.  Extraocular movements intact bilaterally. Neck:  Without mass or deformity.  Trachea is midline. Lungs:  Clear to auscultate bilaterally without rhonchi, wheeze, or rales. Heart:  Regular rate and rhythm.  Without  murmurs, rubs, or gallops. Abdomen:  Soft, nontender, nondistended.  Without guarding or rebound. Extremities: Without cyanosis, clubbing, edema, or obvious deformity. Skin:  Warm and dry, no erythema.  Data Reviewed: I have personally reviewed following labs and imaging studies  CBC: Recent Labs  Lab 11/25/22 1533 11/26/22 0619 11/29/22 0446 11/30/22 0446 11/30/22 0757 11/30/22 0801  WBC 14.3* 12.4* 12.0* 10.0  --   --   HGB 7.9* 8.5* 10.3* 10.1* 9.2* 9.5*  HCT 25.2* 26.9* 31.9* 31.6*  27.0* 28.0*  MCV 102.0* 99.3 98.8 97.5  --   --   PLT 439* 417* 490* 427*  --   --    Basic Metabolic Panel: Recent Labs  Lab 11/25/22 1533 11/26/22 0619 11/29/22 0446 11/30/22 0446 11/30/22 0757 11/30/22 0801  NA 135 135 136 136 138 138  K 3.9 3.9 4.0 3.9 3.7 3.7  CL 103 103 106 103  --  103  CO2 23 22 23 25   --   --   GLUCOSE 316* 281* 168* 242*  --  226*  BUN 10 9 14 13   --  12  CREATININE 0.88 0.67 0.80 0.78  --  0.60  CALCIUM 9.5 9.1 9.7 9.5  --   --   MG  --  1.9  --   --   --   --   PHOS  --  4.5  --   --   --   --    GFR: Estimated Creatinine Clearance: 90 mL/min (by C-G formula based on SCr of 0.6 mg/dL). Liver Function Tests: No results for input(s): "AST", "ALT", "ALKPHOS", "BILITOT", "PROT", "ALBUMIN" in the last 168 hours. No results for input(s): "LIPASE", "AMYLASE" in the last 168 hours. No results for input(s): "AMMONIA" in the last 168 hours. Coagulation Profile: Recent Labs  Lab 11/26/22 0619  INR 1.0   Cardiac Enzymes: No results for input(s): "CKTOTAL", "CKMB", "CKMBINDEX", "TROPONINI" in the last 168 hours. BNP (last 3 results) No results for input(s): "PROBNP" in the last 8760 hours. HbA1C: No results for input(s): "HGBA1C" in the last 72 hours.  CBG: Recent Labs  Lab 11/29/22 0752 11/29/22 1206 11/29/22 1609 11/29/22 2119 11/30/22 0608  GLUCAP 176* 197* 249* 237* 222*   Lipid Profile: No results for input(s): "CHOL", "HDL", "LDLCALC", "TRIG", "CHOLHDL", "LDLDIRECT" in the last 72 hours.  Thyroid Function Tests: No results for input(s): "TSH", "T4TOTAL", "FREET4", "T3FREE", "THYROIDAB" in the last 72 hours. Anemia Panel: No results for input(s): "VITAMINB12", "FOLATE", "FERRITIN", "TIBC", "IRON", "RETICCTPCT" in the last 72 hours.  Sepsis Labs: No results for input(s): "PROCALCITON", "LATICACIDVEN" in the last 168 hours.  Recent Results (from the past 240 hour(s))  Surgical pcr screen     Status: None   Collection Time: 11/28/22   1:33 PM   Specimen: Urine, Clean Catch; Nasal Swab  Result Value Ref Range Status   MRSA, PCR NEGATIVE NEGATIVE Final   Staphylococcus aureus NEGATIVE NEGATIVE Final    Comment: (NOTE) The Xpert SA Assay (FDA approved for NASAL specimens in patients 54 years of age and older), is one component of a comprehensive surveillance program. It is not intended to diagnose infection nor to guide or monitor treatment. Performed at [facility], [address], [place], [place] [address]   SARS Coronavirus 2 by RT PCR (hospital order, performed in [facility] hospital lab) *cepheid single result test* Anterior Nasal Swab     Status: None   Collection Time: 11/29/22  4:17 PM   Specimen:  Anterior Nasal Swab  Result Value Ref Range Status   SARS Coronavirus 2 by RT PCR NEGATIVE NEGATIVE Final    Comment: Performed at [facility], [address], [place], [place] [address]         Radiology Studies: VAS US DOPPLER PRE CABG  Result Date: 11/28/2022 PREOPERATIVE VASCULAR EVALUATION Patient Name:  CERISSA HOUCK  Date of Exam:   11/28/2022 Medical Rec #: [id number]                Accession #:    [id number] Date of Birth: [birth year]                Patient Gender: F Patient Age:   2 years Exam Location:  [facility] Procedure:      VAS US DOPPLER PRE CABG Referring Phys: HARRELL LIGHTFOOT --------------------------------------------------------------------------------  Indications:      Pre-CABG. Patient endorses left lower extremity claudication. Risk Factors:     Hypertension, hyperlipidemia, Diabetes, past history of                   smoking, coronary artery disease, PAD. Comparison Study: No prior study on file Performing Technologist: Sherren Kerns RVS  Examination Guidelines: A complete evaluation includes B-mode imaging, spectral Doppler, color Doppler, and power Doppler as needed of all accessible portions of each vessel. Bilateral testing is considered an integral  part of a complete examination. Limited examinations for reoccurring indications may be performed as noted.  Right Carotid Findings: +----------+--------+--------+--------+--------+------------------+           PSV cm/sEDV cm/sStenosisDescribeComments           +----------+--------+--------+--------+--------+------------------+ CCA Prox  90      10                      intimal thickening +----------+--------+--------+--------+--------+------------------+ CCA Distal106     22                      intimal thickening +----------+--------+--------+--------+--------+------------------+ ICA Prox  140     41      40-59%  calcific                   +----------+--------+--------+--------+--------+------------------+ ICA Mid   243     49                                         +----------+--------+--------+--------+--------+------------------+ ICA Distal102     20                                         +----------+--------+--------+--------+--------+------------------+ ECA       131     4               calcific                   +----------+--------+--------+--------+--------+------------------+ +----------+--------+-------+---------+------------+           PSV cm/sEDV cmsDescribe Arm Pressure +----------+--------+-------+---------+------------+ Subclavian291            Turbulent             +----------+--------+-------+---------+------------+ +---------+--------+---+--------+--+ VertebralPSV cm/s116EDV cm/s38 +---------+--------+---+--------+--+ Left Carotid Findings: +----------+--------+--------+--------+--------+------------------+           PSV cm/sEDV cm/sStenosisDescribeComments           +----------+--------+--------+--------+--------+------------------+  CCA Prox  93      16                      intimal thickening +----------+--------+--------+--------+--------+------------------+ CCA Distal112     25                      intimal  thickening +----------+--------+--------+--------+--------+------------------+ ICA Prox  248     66      60-79%  calcificShadowing          +----------+--------+--------+--------+--------+------------------+ ICA Mid   105     25                                         +----------+--------+--------+--------+--------+------------------+ ICA Distal106     33                                         +----------+--------+--------+--------+--------+------------------+ ECA       239     1               calcific                   +----------+--------+--------+--------+--------+------------------+ +----------+--------+--------+--------+------------+ SubclavianPSV cm/sEDV cm/sDescribeArm Pressure +----------+--------+--------+--------+------------+           176                                  +----------+--------+--------+--------+------------+  ABI Findings: +------------------+-----+---------+ Rt Pressure (mmHg)IndexWaveform  +------------------+-----+---------+ 131                    triphasic +------------------+-----+---------+ 127               0.97 biphasic  +------------------+-----+---------+ 128               0.98 biphasic  +------------------+-----+---------+ 77                0.59 Abnormal  +------------------+-----+---------+ +------------------+-----+---------+ Lt Pressure (mmHg)IndexWaveform  +------------------+-----+---------+ 128                    triphasic +------------------+-----+---------+ 93                0.71 biphasic  +------------------+-----+---------+ 120               0.92 biphasic  +------------------+-----+---------+ 49                0.37 Abnormal  +------------------+-----+---------+ +-------+---------------+ ABI/TBIToday's ABI/TBI +-------+---------------+ Right  0.98/0.59       +-------+---------------+ Left   0.92/0.37       +-------+---------------+  Right Doppler Findings:  +--------+--------+---------+ Site    PressureDoppler   +--------+--------+---------+ [id number]     triphasic +--------+--------+---------+ Radial          triphasic +--------+--------+---------+ Ulnar           triphasic +--------+--------+---------+  Left Doppler Findings: +--------+--------+---------+ Site    PressureDoppler   +--------+--------+---------+ [id number]     triphasic +--------+--------+---------+ Radial          triphasic +--------+--------+---------+ Ulnar           triphasic +--------+--------+---------+   Summary: Right Carotid: Velocities in the right ICA are consistent with a 40-59%  stenosis. Left Carotid: Velocities in the left ICA are consistent with a 60-79% stenosis. Vertebrals:  Right vertebral artery demonstrates antegrade flow. Subclavians: Normal flow hemodynamics were seen in bilateral subclavian              arteries. Right ABI: Resting right ankle-brachial index is within normal range. The right toe-brachial index is abnormal. Left ABI: Resting left ankle-brachial index is within normal range. The left toe-brachial index is abnormal. Right Upper Extremity: Doppler waveforms remain within normal limits with right radial compression. Doppler waveforms decrease 50% with right ulnar compression. Left Upper Extremity: Doppler waveforms remain within normal limits with left radial compression. Doppler waveform obliterate with left ulnar compression.  Electronically signed by Gerarda Fraction on 11/28/2022 at 3:45:36 PM.    Final         Scheduled Meds:  [MAR Hold] aspirin  81 mg Oral Daily   [MAR Hold] atorvastatin  80 mg Oral Daily   bisacodyl  5 mg Oral Once   [MAR Hold] cyanocobalamin  1,000 mcg Intramuscular Daily   epinephrine  0-10 mcg/min Intravenous To OR   [MAR Hold] ezetimibe  10 mg Oral Daily   [MAR Hold] ferrous sulfate  325 mg Oral Q breakfast   [MAR Hold] heparin injection (subcutaneous)  5,000 Units Subcutaneous  Q8H   heparin sodium (porcine) 2,500 Units, papaverine 30 mg in electrolyte-A (PLASMALYTE-A PH 7.4) 500 mL irrigation   Irrigation To OR   [MAR Hold] hydrochlorothiazide  12.5 mg Oral Daily   [MAR Hold] insulin aspart  0-20 Units Subcutaneous TID WC   [MAR Hold] insulin aspart  0-5 Units Subcutaneous QHS   [MAR Hold] insulin aspart  20 Units Subcutaneous TID WC   [MAR Hold] insulin detemir  50 Units Subcutaneous BID   [place] Blood Cardioplegia vial (lidocaine/magnesium/mannitol 0.26g-4g-6.4g)   Intracoronary To OR   [MAR Hold] loratadine  10 mg Oral Daily   [MAR Hold] metoprolol tartrate  25 mg Oral BID   potassium chloride  80 mEq Other To OR   [MAR Hold] sodium chloride flush  3 mL Intravenous Q12H   [MAR Hold] sodium chloride flush  3 mL Intravenous Q12H   tranexamic acid  2 mg/kg Intracatheter To OR   Continuous Infusions:   ceFAZolin (ANCEF) IV     dexmedetomidine     heparin 30,000 units/NS 1000 mL solution for CELLSAVER     milrinone     nitroGLYCERIN     norepinephrine     vancomycin        LOS: 5 days   Time spent:  Azucena Fallen, DO [facility]  If 7PM-7AM, please contact night-coverage [email or web address]  11/30/2022, 8:48 AM

## 2022-11-30 NOTE — Anesthesia Procedure Notes (Signed)
Central Venous Catheter Insertion Performed by: Marcene Duos, MD, anesthesiologist Start/End11/26/2024 7:05 AM, 11/30/2022 7:15 AM Patient location: Pre-op. Preanesthetic checklist: patient identified, IV checked, site marked, risks and benefits discussed, surgical consent, monitors and equipment checked, pre-op evaluation, timeout performed and anesthesia consent Position: Trendelenburg Lidocaine 1% used for infiltration and patient sedated Hand hygiene performed , maximum sterile barriers used  and Seldinger technique used Catheter size: 8.5 Fr Total catheter length 10. Central line was placed.Sheath introducer Swan type:thermodilution Procedure performed using ultrasound guided technique. Ultrasound Notes:anatomy identified, needle tip was noted to be adjacent to the nerve/plexus identified, no ultrasound evidence of intravascular and/or intraneural injection and image(s) printed for medical record Attempts: 1 Following insertion, line sutured, dressing applied and Biopatch. Post procedure assessment: blood return through all ports, free fluid flow and no air  Patient tolerated the procedure well with no immediate complications. Additional procedure comments: Triple lumen multi access catheter placed through introducer port. All lines aspirated and flushed.Marland Kitchen

## 2022-11-30 NOTE — Anesthesia Procedure Notes (Signed)
Arterial Line Insertion Start/End11/26/2024 6:50 AM, 11/30/2022 6:55 AM Performed by: Marcene Duos, MD, Zamirah Denny, Talbot Grumbling, CRNA, CRNA  Patient location: Pre-op. Preanesthetic checklist: patient identified, IV checked, site marked, risks and benefits discussed, surgical consent, monitors and equipment checked, pre-op evaluation, timeout performed and anesthesia consent Lidocaine 1% used for infiltration Left, radial was placed Catheter size: 20 G Hand hygiene performed  and maximum sterile barriers used  Allen's test indicative of satisfactory collateral circulation Attempts: 1 Procedure performed without using ultrasound guided technique. Following insertion, dressing applied and Biopatch. Post procedure assessment: normal and unchanged  Patient tolerated the procedure well with no immediate complications.

## 2022-11-30 NOTE — Progress Notes (Signed)
     [address]       [place] [address]             [phone]       No events Vitals:   11/29/22 2045 11/30/22 0513  BP: 135/60 (!) 126/59  Pulse: 86 77  Resp: 16 20  Temp: 98.8 F (37.1 C) 98.7 F (37.1 C)  SpO2: 98% 96%   Alert NAD Sinus EWOB  OR today for CABG

## 2022-11-30 NOTE — Anesthesia Procedure Notes (Signed)
Procedure Name: Intubation Date/Time: 11/30/2022 7:52 AM  Performed by: Margarita Rana, CRNAPre-anesthesia Checklist: Patient identified, Patient being monitored, Timeout performed, Emergency Drugs available and Suction available Patient Re-evaluated:Patient Re-evaluated prior to induction Oxygen Delivery Method: Circle System Utilized Preoxygenation: Pre-oxygenation with 100% oxygen Induction Type: IV induction Ventilation: Two handed mask ventilation required Laryngoscope Size: Mac and 4 Grade View: Grade II Tube type: Oral Tube size: 8.0 mm Number of attempts: 1 Airway Equipment and Method: Stylet Placement Confirmation: ETT inserted through vocal cords under direct vision, positive ETCO2 and breath sounds checked- equal and bilateral Secured at: 22 cm Tube secured with: Tape Dental Injury: Teeth and Oropharynx as per pre-operative assessment

## 2022-11-30 NOTE — Procedures (Signed)
Extubation Procedure Note  Patient Details:   Name: Catherine Moon DOB: 1960/09/25 MRN: [id number]   Airway Documentation:    Vent end date: 11/30/22 Vent end time: 1445   Evaluation  O2 sats: stable throughout Complications: No apparent complications Patient did tolerate procedure well. Bilateral Breath Sounds: Clear, Diminished   Yes  Pt was extubated per MD order and placed on 3 L [place]. No rapid wean protocol per Dr. Katrinka Blazing. Cuff leak was noted prior to extubation and no stridor post. Pt is stable at this time. RT will monitor.   Merlene Laughter 11/30/2022, 2:50 PM

## 2022-11-30 NOTE — Op Note (Signed)
[address]       [place] [address]             [phone]                                          11/30/2022 Patient:  [name] Pre-Op Dx: Left Main CAD HTN HLP Obesity DM   Post-op Dx:  same Procedure: CABG X 2.  LIMA LAD, RSVG PLV   Endoscopic greater saphenous vein harvest on the right   Surgeon and Role:      * Tyriek Hofman, Eliezer Lofts, MD - Primary    * B. Stehler , PA-C - assisting An experienced assistant was required given the complexity of this surgery and the standard of surgical care. The assistant was needed for exposure, dissection, suctioning, retraction of delicate tissues and sutures, instrument exchange and for overall help during this procedure.    Anesthesia  general EBL:  Blood Administration: 1 units pRBCs Xclamp Time:  32 min Pump Time:   Drains: 12 F blake drain:  L, mediastinal  Wires: ventricular Counts: correct   Indications: 62 yo woman with multiple cardiac risk factors including insulin dependent DM, hyperlipidemia, hypertension, hx of smoking and obesity.  Know mild AS.  Presented with 6 day history of stuttering CP.  Finally did not resolve and became severe 10/10.  R/I for non STEMI.  She underwent cardiac cath which revealed a 70% left main stenosis in a left dominant circulation.  EF 50-55%.  LVEDP elevated at 33 mmHg   Findings: Good LIMA and vein.  Good distal targets  Operative Technique: All invasive lines were placed in pre-op holding.  After the risks, benefits and alternatives were thoroughly discussed, the patient was brought to the operative theatre.  Anesthesia was induced, and the patient was prepped and draped in normal sterile fashion.  An appropriate surgical pause was performed, and pre-operative antibiotics were dosed accordingly.  We began with simultaneous incisions along the right leg for harvesting of the greater saphenous vein and the chest for the sternotomy.  In  regards to the sternotomy, this was carried down with bovie cautery, and the sternum was divided with a reciprocating saw.  Meticulous hemostasis was obtained.  The left internal thoracic artery was exposed and harvested in in pedicled fashion.  The patient was systemically heparinized, and the artery was divided distally, and placed in a papaverine sponge.    The sternal elevator was removed, and a retractor was placed.  The pericardium was divided in the midline and fashioned into a cradle with pericardial stitches.   After we confirmed an appropriate ACT, the ascending aorta was cannulated in standard fashion.  The right atrial appendage was used for venous cannulation site.  Cardiopulmonary bypass was initiated, and the heart retractor was placed. The cross clamp was applied, and a dose of anterograde cardioplegia was given with good arrest of the heart.  We moved to the posterior wall of the heart, and found a good target on the PLV.  An arteriotomy was made, and the vein graft was anastomosed to it in an end to side fashion.  Finally, we exposed a good target on the LAD, and fashioned an end to side anastomosis between it and the LITA.  We began to re-warm, and a re-animation dose of cardioplegia was given.  The heart was de-aired, and the cross clamp was removed.  Meticulous hemostasis was obtained.    A partial occludding clamp was then placed on the ascending aorta, and we created an end to side anastomosis between it and the proximal vein graft.  Rings were placed on the proximal anastomosis.  Hemostasis was obtained, and we separated from cardiopulmonary bypass without event.  The heparin was reversed with protamine.  Chest tubes and wires were placed, and the sternum was re-approximated with sternal wires.  The soft tissue and skin were re-approximated wth absorbable suture.    The patient tolerated the procedure without any immediate complications, and was transferred to the ICU in guarded  condition.  Linna Thebeau Keane Scrape

## 2022-12-01 ENCOUNTER — Encounter (HOSPITAL_COMMUNITY): Payer: Self-pay | Admitting: Thoracic Surgery (Cardiothoracic Vascular Surgery)

## 2022-12-01 ENCOUNTER — Telehealth: Payer: Self-pay

## 2022-12-01 ENCOUNTER — Inpatient Hospital Stay (HOSPITAL_COMMUNITY): Payer: BC Managed Care – PPO

## 2022-12-01 DIAGNOSIS — I10 Essential (primary) hypertension: Secondary | ICD-10-CM | POA: Diagnosis not present

## 2022-12-01 DIAGNOSIS — I214 Non-ST elevation (NSTEMI) myocardial infarction: Secondary | ICD-10-CM | POA: Diagnosis not present

## 2022-12-01 DIAGNOSIS — I200 Unstable angina: Secondary | ICD-10-CM | POA: Diagnosis not present

## 2022-12-01 DIAGNOSIS — E785 Hyperlipidemia, unspecified: Secondary | ICD-10-CM | POA: Diagnosis not present

## 2022-12-01 DIAGNOSIS — E119 Type 2 diabetes mellitus without complications: Secondary | ICD-10-CM | POA: Diagnosis not present

## 2022-12-01 LAB — BASIC METABOLIC PANEL
Anion gap: 8 (ref 5–15)
Anion gap: 8 (ref 5–15)
BUN: 10 mg/dL (ref 8–23)
BUN: 10 mg/dL (ref 8–23)
CO2: 20 mmol/L — ABNORMAL LOW (ref 22–32)
CO2: 23 mmol/L (ref 22–32)
Calcium: 8 mg/dL — ABNORMAL LOW (ref 8.9–10.3)
Calcium: 8.5 mg/dL — ABNORMAL LOW (ref 8.9–10.3)
Chloride: 109 mmol/L (ref 98–111)
Chloride: 103 mmol/L (ref 98–111)
Creatinine, Ser: 0.61 mg/dL (ref 0.44–1.00)
Creatinine, Ser: 0.78 mg/dL (ref 0.44–1.00)
GFR, Estimated: >60 mL/min (ref >60)
GFR, Estimated: >60 mL/min (ref >60)
Glucose, Bld: 130 mg/dL — ABNORMAL HIGH (ref 70–99)
Glucose, Bld: 246 mg/dL — ABNORMAL HIGH (ref 70–99)
Potassium: 3.8 mmol/L (ref 3.5–5.1)
Potassium: 4.3 mmol/L (ref 3.5–5.1)
Sodium: 137 mmol/L (ref 135–145)
Sodium: 134 mmol/L — ABNORMAL LOW (ref 135–145)

## 2022-12-01 LAB — MAGNESIUM
Magnesium: 2.2 mg/dL (ref 1.7–2.4)
Magnesium: 2.2 mg/dL (ref 1.7–2.4)

## 2022-12-01 LAB — GLUCOSE, CAPILLARY
Glucose-Capillary: 115 mg/dL — ABNORMAL HIGH (ref 70–99)
Glucose-Capillary: 122 mg/dL — ABNORMAL HIGH (ref 70–99)
Glucose-Capillary: 125 mg/dL — ABNORMAL HIGH (ref 70–99)
Glucose-Capillary: 139 mg/dL — ABNORMAL HIGH (ref 70–99)
Glucose-Capillary: 147 mg/dL — ABNORMAL HIGH (ref 70–99)
Glucose-Capillary: 147 mg/dL — ABNORMAL HIGH (ref 70–99)
Glucose-Capillary: 172 mg/dL — ABNORMAL HIGH (ref 70–99)
Glucose-Capillary: 252 mg/dL — ABNORMAL HIGH (ref 70–99)
Glucose-Capillary: 259 mg/dL — ABNORMAL HIGH (ref 70–99)
Glucose-Capillary: 322 mg/dL — ABNORMAL HIGH (ref 70–99)

## 2022-12-01 LAB — BPAM RBC
Blood Product Expiration Date: 202412162359
Blood Product Expiration Date: 202412162359
ISSUE DATE / TIME: 202411260913
ISSUE DATE / TIME: 202411260913
Unit Type and Rh: 5100
Unit Type and Rh: 5100

## 2022-12-01 LAB — TYPE AND SCREEN
ABO/RH(D): O POS
Antibody Screen: NEGATIVE
Unit division: 0
Unit division: 0

## 2022-12-01 LAB — CBC
HCT: 30.9 % — ABNORMAL LOW (ref 36.0–46.0)
Hemoglobin: 10.2 g/dL — ABNORMAL LOW (ref 12.0–15.0)
MCH: 31.3 pg (ref 26.0–34.0)
MCHC: 33 g/dL (ref 30.0–36.0)
MCV: 94.8 fL (ref 80.0–100.0)
Platelets: 310 10*3/uL (ref 150–400)
RBC: 3.26 MIL/uL — ABNORMAL LOW (ref 3.87–5.11)
RDW: 19.3 % — ABNORMAL HIGH (ref 11.5–15.5)
WBC: 15.4 10*3/uL — ABNORMAL HIGH (ref 4.0–10.5)
nRBC: 0 % (ref 0.0–0.2)

## 2022-12-01 LAB — LIPOPROTEIN A (LPA): Lipoprotein (a): 70.3 nmol/L — ABNORMAL HIGH (ref <75.0)

## 2022-12-01 MED ORDER — CLOPIDOGREL BISULFATE 75 MG PO TABS
75.0000 mg | ORAL_TABLET | Freq: Every day | ORAL | Status: DC
  Administered 2022-12-01 – 2022-12-04 (×4): 75 mg via ORAL
  Filled 2022-12-01 (×4): qty 1

## 2022-12-01 MED ORDER — METHOCARBAMOL 1000 MG/10ML IJ SOLN
1000.0000 mg | Freq: Three times a day (TID) | INTRAMUSCULAR | Status: AC
  Administered 2022-12-01 – 2022-12-03 (×6): 1000 mg via INTRAVENOUS
  Filled 2022-12-01 (×8): qty 10

## 2022-12-01 MED ORDER — INSULIN ASPART 100 UNIT/ML IJ SOLN
0.0000 [IU] | INTRAMUSCULAR | Status: DC
  Administered 2022-12-01: 16 [IU] via SUBCUTANEOUS
  Administered 2022-12-01 – 2022-12-02 (×5): 12 [IU] via SUBCUTANEOUS
  Administered 2022-12-02 (×2): 8 [IU] via SUBCUTANEOUS
  Administered 2022-12-02: 12 [IU] via SUBCUTANEOUS
  Administered 2022-12-03: 4 [IU] via SUBCUTANEOUS
  Administered 2022-12-03: 2 [IU] via SUBCUTANEOUS

## 2022-12-01 MED ORDER — ENOXAPARIN SODIUM 40 MG/0.4ML IJ SOSY
40.0000 mg | PREFILLED_SYRINGE | Freq: Every day | INTRAMUSCULAR | Status: DC
Start: 2022-12-01 — End: 2022-12-04
  Administered 2022-12-01 – 2022-12-03 (×3): 40 mg via SUBCUTANEOUS
  Filled 2022-12-01 (×3): qty 0.4

## 2022-12-01 MED ORDER — ASPIRIN 81 MG PO TBEC
81.0000 mg | DELAYED_RELEASE_TABLET | Freq: Every day | ORAL | Status: DC
  Administered 2022-12-01 – 2022-12-04 (×4): 81 mg via ORAL
  Filled 2022-12-01 (×4): qty 1

## 2022-12-01 MED ORDER — METHOCARBAMOL 1000 MG/10ML IJ SOLN
500.0000 mg | Freq: Four times a day (QID) | INTRAMUSCULAR | Status: DC | PRN
  Administered 2022-12-01: 500 mg via INTRAVENOUS
  Filled 2022-12-01 (×2): qty 5

## 2022-12-01 MED ORDER — POTASSIUM CHLORIDE CRYS ER 20 MEQ PO TBCR
40.0000 meq | EXTENDED_RELEASE_TABLET | Freq: Once | ORAL | Status: AC
  Administered 2022-12-01: 40 meq via ORAL
  Filled 2022-12-01: qty 2

## 2022-12-01 MED ORDER — FUROSEMIDE 10 MG/ML IJ SOLN
40.0000 mg | Freq: Four times a day (QID) | INTRAMUSCULAR | Status: DC
  Administered 2022-12-01: 40 mg via INTRAVENOUS
  Filled 2022-12-01: qty 4

## 2022-12-01 MED ORDER — INSULIN ASPART 100 UNIT/ML IJ SOLN: 0.0000 [IU] | INTRAMUSCULAR | Status: DC

## 2022-12-01 MED ORDER — METOPROLOL TARTRATE 25 MG PO TABS
25.0000 mg | ORAL_TABLET | Freq: Two times a day (BID) | ORAL | Status: DC
  Administered 2022-12-01 (×2): 25 mg via ORAL
  Filled 2022-12-01 (×2): qty 1

## 2022-12-01 MED ORDER — HYDROMORPHONE HCL 1 MG/ML IJ SOLN: 0.5000 mg | INTRAMUSCULAR | Status: DC | PRN

## 2022-12-01 MED ORDER — METHOCARBAMOL 1000 MG/10ML IJ SOLN: 500.0000 mg | Freq: Three times a day (TID) | INTRAMUSCULAR | Status: DC

## 2022-12-01 MED ORDER — INSULIN DETEMIR 100 UNIT/ML ~~LOC~~ SOLN
25.0000 [IU] | Freq: Two times a day (BID) | SUBCUTANEOUS | Status: DC
  Administered 2022-12-01 (×2): 25 [IU] via SUBCUTANEOUS
  Filled 2022-12-01 (×4): qty 0.25

## 2022-12-01 MED ORDER — ENOXAPARIN SODIUM 40 MG/0.4ML IJ SOSY: 40.0000 mg | PREFILLED_SYRINGE | Freq: Every day | INTRAMUSCULAR | Status: DC

## 2022-12-01 NOTE — Progress Notes (Signed)
   Patient Name: Emalynn Elizarraras Date of Encounter: 12/01/2022 [facility] Cardiologist: None   Interval Summary  .    Sitting up in a chair this morning.  Doing okay with no chest pain.  Some epigastric discomfort at the insertion site of her chest tubes.  No other complaints.  Vital Signs .    Vitals:   12/01/22 0600 12/01/22 0630 12/01/22 0700 12/01/22 0802  BP:  (!) 151/57    Pulse: (!) 101 (!) 101 97   Resp: 20 (!) 28 (!) 0   Temp:    99.1 F (37.3 C)  TempSrc:    Oral  SpO2: 91% 93% 94%   Weight:      Height:        Intake/Output Summary (Last 24 hours) at 12/01/2022 0818 Last data filed at 12/01/2022 0600 Gross per 24 hour  Intake 4731.37 ml  Output 4695 ml  Net 36.37 ml      12/01/2022    5:00 AM 11/30/2022    5:13 AM 11/29/2022    4:34 AM  Last 3 Weights  Weight (lbs) 237 lb 3.4 oz 229 lb 3.2 oz 231 lb 14.4 oz  Weight (kg) 107.6 kg 103.964 kg 105.189 kg      Telemetry/ECG    Sinus rhythm without any significant arrhythmia- Personally Reviewed  Physical Exam .   GEN: No acute distress.   Neck: No JVD Cardiac: RRR, no murmurs, rubs, or gallops.  Respiratory: Clear to auscultation bilaterally. GI: Soft, nontender, non-distended  MS: No edema  Assessment & Plan .     NSTEMI: POD #1 from CABG with LIMA-LAD, SVG-PLA.  On aspirin and clopidogrel per surgical team. Moderate carotid stenosis - neuro stable post-op, on DAPT HTN -on low-dose metoprolol, normotensive, off pressors early postop Hyperlipidemia - atorvastatin 80 mg. LDL 42 treated with atorvastatin and ezetimibe  Overall appears to be doing very well postoperative day #1 from two-vessel CABG.  Cardiology team will follow.  Heart rhythm stable.  For questions or updates, please contact [facility] Please consult [email or web address] for contact info under        Signed, Tonny Bollman, MD

## 2022-12-01 NOTE — Progress Notes (Addendum)
[address]       [place] [address]             [phone]      1 Day Post-Op Procedure(s) (LRB): CORONARY ARTERY BYPASS GRAFTING X 2 , USING LEFT INTERNAL MAMMARY ARTERY AND ENDOSCOPICALLY HARVESTED RIGHT SAPHENOUS VEIN GRAFT (N/A) TRANSESOPHAGEAL ECHOCARDIOGRAM (TEE) (N/A) Subjective: Patient is up in the chair this AM. Does complain of localized pain in the left lower chest pain/left upper quadrant of her abdomen with deep breaths near the chest tube.  Objective: Vital signs in last 24 hours: Temp:  [98.8 F (37.1 C)-99.7 F (37.6 C)] 99.7 F (37.6 C) (11/27 0530) Pulse Rate:  [69-101] 97 (11/27 0700) Cardiac Rhythm: Normal sinus rhythm (11/27 0400) Resp:  [0-36] 0 (11/27 0700) BP: (88-151)/(43-91) 151/57 (11/27 0630) SpO2:  [91 %-100 %] 94 % (11/27 0700) Arterial Line BP: (100-208)/(42-75) 172/46 (11/27 0700) FiO2 (%):  [40 %-50 %] 40 % (11/26 1355) Weight:  [107.6 kg] 107.6 kg (11/27 0500)  Hemodynamic parameters for last 24 hours: CVP:  [2 mmHg-61 mmHg] 2 mmHg CO:  [6.7 L/min-12 L/min] 11 L/min CI:  [3.5 L/min/m2-5.7 L/min/m2] 5 L/min/m2  Intake/Output from previous day: 11/26 0701 - 11/27 0700 In: 5031.4 [I.V.:2225.8; Blood:650; IV Piggyback:2155.6] Out: 4695 [Urine:3385; Blood:950; Chest Tube:360] Intake/Output this shift: No intake/output data recorded.  General appearance: alert, cooperative, and no distress Neurologic: intact Heart: regular rate and rhythm, S1, S2 normal, no murmur, click, rub or gallop Lungs: diminished breath sounds bibasilar Abdomen: soft, no rebound tenderness, LUQ not tender to palpation, +BS, no distension Extremities: edema 1+ Wound: Clean and dry EVH site without sign of infection, clean dressing in place over sternal incision  Lab Results: Recent Labs    11/30/22 1823 12/01/22 0505  WBC 16.0* 15.4*  HGB 9.8* 10.2*  HCT 30.6* 30.9*  PLT 277 310   BMET:  Recent Labs    11/30/22 1823  12/01/22 0505  NA 139 137  K 4.1 3.8  CL 111 109  CO2 21* 20*  GLUCOSE 134* 130*  BUN 10 10  CREATININE 0.69 0.61  CALCIUM 8.3* 8.0*    PT/INR:  Recent Labs    11/30/22 1217  LABPROT 17.0*  INR 1.4*   ABG    Component Value Date/Time   PHART 7.340 (L) 11/30/2022 1555   HCO3 19.7 (L) 11/30/2022 1555   TCO2 21 (L) 11/30/2022 1555   ACIDBASEDEF 5.0 (H) 11/30/2022 1555   O2SAT 95 11/30/2022 1555   CBG (last 3)  Recent Labs    12/01/22 0154 12/01/22 0358 12/01/22 0623  GLUCAP 115* 122* 139*    Assessment/Plan: S/P Procedure(s) (LRB): CORONARY ARTERY BYPASS GRAFTING X 2 , USING LEFT INTERNAL MAMMARY ARTERY AND ENDOSCOPICALLY HARVESTED RIGHT SAPHENOUS VEIN GRAFT (N/A) TRANSESOPHAGEAL ECHOCARDIOGRAM (TEE) (N/A)  Neuro: Some pain this AM, mostly localized to lower left chets/abdominal LUQ with deep breaths likely due to chest tube.  Continue current pain regimen, Robaxin added per critical care team.   CV: Off drips. SBP 151 this AM. CI 5. D/C arterial line. Start low dose beta blocker. NSR-ST, HR 90s-103. D/C EPW?   Pulm: Extubated yesterday afternoon. Saturating well on 4L [place]. CT output 360cc/24hrs. CXR with bibasilar atelectasis and mild interstitial edema due to fluid administration from surgery. Encourage IS and ambulation. Will need diuresis.   GI: Not yet passing gas. Some pain with deep breaths but no tenderness to palpation. +bowel sounds. No nausea or vomiting. Advance  diet as tolerated.   Endo: T2DM insulin dependent, preop A1C 7.5. Takes 100U TID with meals at home. CBGs controlled on insulin drip. Will transition to Levemir and SSI.   Renal: Cr 0.61. UO 3385cc/24hrs. K 3.8 supplement. About +7lbs from preop weight. Diurese?  ID: Likely reactive leukocytosis, WBC 15.4. Tmax 99.7. Will monitor.   Expected postop ABLA: H/H 10.2/30.9 not clinically significant at this time.   DVT Prophylaxis: Lovenox today  Chronic B12 deficiency and iron deficiency:  Continue daily IM B12 and ferrous sulfate 325mg  daily  Dispo: Continue ICU care   LOS: 6 days    Jenny Reichmann, PA-C 12/01/2022   Agree with above  Doing well POD progression Will remove wires and start plavix  Dois Juarbe O Jorgia Manthei

## 2022-12-01 NOTE — Telephone Encounter (Signed)
Attending Physician statement completed and faxed to [employer]@ [phone]/ Beginning LOA 11/25/22 through 02/28/23./ DOS 11/30/22.

## 2022-12-01 NOTE — Progress Notes (Signed)
NAME:  Catherine Moon, MRN:  [id number], DOB:  1960-10-24, LOS: 6 ADMISSION DATE:  11/25/2022, CONSULTATION DATE:  11/26 REFERRING MD:  Nat Christen, CHIEF COMPLAINT:  s/p CABG   History of Present Illness:  62 year old female with past medical history of hypertension, hyperlipidemia, type 2 diabetes who presented to the emergency department on 11/25/22 with complaint of chest pain. Initially described as intermittent, exertional pain with radiation to the neck and shoulders. Troponins at that time 138>140. No EKG changes. She was admitted to hospitalist for NSTEMI. Cardiology evaluated the patient, recommending cardiac cath. Cath showed 70% distal left main stenosis, severe disease of small non-dominant RCA. CABG was recommended and cardiothoracic surgery was consulted.   11/26: CABG x2 LIMA LAD, RSVG PLV Cross clamp time 32 minutes Pump time 62 minutes 1U PRBC PCCM consulted for post-operative management  Pertinent  Medical History  hypertension, hyperlipidemia, type 2 diabetes  Significant Hospital Events: Including procedures, antibiotic start and stop dates in addition to other pertinent events   11/21: admit for NSTEMI  11/22: cath with 70% distal left main stenosis, severe small non-dominant RCA. Echo LVEF 55-60%, bicuspid aortic valve, trivial regurgitation, no stenosis  11/26: s/p CABG x2, extubated without incident 11/27: +robaxin for Left lower pain near chest tube. Working on IS, OOB.  Interim History / Subjective:  Feels okay this morning. Having pain around chest tube sites making it difficult to take deep breaths with IS. Only hitting about 250cc IS volumes. On 3LNC. chest tube output yesterday   Objective   Blood pressure (!) 151/57, pulse (!) 101, temperature 99.7 F (37.6 C), resp. rate (!) 28, height 5\' 6"  (1.676 m), weight 107.6 kg, SpO2 93%. CVP:  [2 mmHg-61 mmHg] 5 mmHg CO:  [6.7 L/min-12 L/min] 11 L/min CI:  [3.5 L/min/m2-5.7 L/min/m2] 5  L/min/m2  Vent Mode: PSV;CPAP FiO2 (%):  [40 %-50 %] 40 % Set Rate:  [16 bmp] 16 bmp Vt Set:  [470 mL] 470 mL PEEP:  [5 cmH20] 5 cmH20 Pressure Support:  [8 cmH20-10 cmH20] 8 cmH20 Plateau Pressure:  [22 cmH20] 22 cmH20   Intake/Output Summary (Last 24 hours) at 12/01/2022 0656 Last data filed at 12/01/2022 0600 Gross per 24 hour  Intake 5031.37 ml  Output 4695 ml  Net 336.37 ml   Filed Weights   11/29/22 0434 11/30/22 0513 12/01/22 0500  Weight: 105.2 kg 104 kg 107.6 kg    Examination: General: obese female, alert, awake, oob in chair HENT: normocephalic, atraumatic, mucous membranes moist, anicteric sclera Lungs: clear bilaterally, on 3LNC, equal chest rise and fall, no respiratory distress  Cardiovascular: midline surgical incision with dressing CDI, no murmur, rub, gallop  Abdomen: rounded, soft  Extremities: BLE pitting edema, extremities warm  Neuro: alert, oriented, no focal neurological exam  GU: foley   Resolved Hospital Problem list     Assessment & Plan:  CAD s/p CABG x2 LIMA LAD, RSVG PLV; cross clamp times 32 minutes, pump time 62 minutes.  NSTEMI  - post op management per TCTS  - chest tube, wires per TCTS. out 11/26  - con't asa 81mg  daily  - neo weaned off.  - + robaxin 500mg  q6h to pain control regimen - PT/OT/IS  - GDMT when clinically appropriate   Hypertension  - when clinically appropriate, resume home metoprolol, hydrochlorothiazide, per TCTS for initiation  Hyperlipidemia  - atorvastatin 80mg  daily  - zetia 10mg  daily   Diabetes; A1c 7.5 - glucoses stable  - con't insulin gtt  per protocol  - transition to SQ SSI and long acting coverage when weaned off   B12 deficiency  Iron deficiency  - B12 daily IM  - ferrous sulfate 325mg  daily   Best Practice (right click and "Reselect all SmartList Selections" daily)   Diet/type: Regular consistency (see orders) DVT prophylaxis: SCD Pressure ulcer(s). pressure ulcer stage none was  NOT present on admission GI prophylaxis: N/A Lines: Central line, Arterial Line, and yes and it is still needed Foley:  Yes, and it is still needed Code Status:  full code Last date of multidisciplinary goals of care discussion []   Labs   CBC: Recent Labs  Lab 11/29/22 0446 11/30/22 0446 11/30/22 0757 11/30/22 1030 11/30/22 1051 11/30/22 1217 11/30/22 1221 11/30/22 1442 11/30/22 1555 11/30/22 1823 12/01/22 0505  WBC 12.0* 10.0  --   --   --  14.8*  --   --   --  16.0* 15.4*  HGB 10.3* 10.1*   < > 5.3*   < > 9.3* 10.2* 9.2* 9.9* 9.8* 10.2*  HCT 31.9* 31.6*   < > 16.9*   < > 28.9* 30.0* 27.0* 29.0* 30.6* 30.9*  MCV 98.8 97.5  --   --   --  97.0  --   --   --  93.9 94.8  PLT 490* 427*  --  282  --  249  --   --   --  277 310   < > = values in this interval not displayed.    Basic Metabolic Panel: Recent Labs  Lab 11/26/22 0619 11/29/22 0446 11/30/22 0446 11/30/22 0757 11/30/22 0938 11/30/22 1006 11/30/22 1017 11/30/22 1051 11/30/22 1123 11/30/22 1221 11/30/22 1442 11/30/22 1555 11/30/22 1823 12/01/22 0505  NA 135 136 136   < > 137   < > 126*   < > 133* 139 142 142 139 137  K 3.9 4.0 3.9   < > 3.9   < > 6.0*   < > 4.6 4.0 4.2 4.1 4.1 3.8  CL 103 106 103   < > 103  --  95*  --  100  --   --   --  111 109  CO2 22 23 25   --   --   --   --   --   --   --   --   --  21* 20*  GLUCOSE 281* 168* 242*   < > 204*  --  152*  --  216*  --   --   --  134* 130*  BUN 9 14 13    < > 13  --  12  --  12  --   --   --  10 10  CREATININE 0.67 0.80 0.78   < > 0.60  --  0.60  --  0.70  --   --   --  0.69 0.61  CALCIUM 9.1 9.7 9.5  --   --   --   --   --   --   --   --   --  8.3* 8.0*  MG 1.9  --   --   --   --   --   --   --   --   --   --   --  2.8* 2.2  PHOS 4.5  --   --   --   --   --   --   --   --   --   --   --   --   --    < > =  values in this interval not displayed.   GFR: Estimated Creatinine Clearance: 91.6 mL/min (by C-G formula based on SCr of 0.61 mg/dL). Recent Labs   Lab 11/30/22 0446 11/30/22 1217 11/30/22 1823 12/01/22 0505  WBC 10.0 14.8* 16.0* 15.4*    Liver Function Tests: No results for input(s): "AST", "ALT", "ALKPHOS", "BILITOT", "PROT", "ALBUMIN" in the last 168 hours. No results for input(s): "LIPASE", "AMYLASE" in the last 168 hours. No results for input(s): "AMMONIA" in the last 168 hours.  ABG    Component Value Date/Time   PHART 7.340 (L) 11/30/2022 1555   PCO2ART 36.7 11/30/2022 1555   PO2ART 83 11/30/2022 1555   HCO3 19.7 (L) 11/30/2022 1555   TCO2 21 (L) 11/30/2022 1555   ACIDBASEDEF 5.0 (H) 11/30/2022 1555   O2SAT 95 11/30/2022 1555     Coagulation Profile: Recent Labs  Lab 11/26/22 0619 11/30/22 1217  INR 1.0 1.4*    Cardiac Enzymes: No results for input(s): "CKTOTAL", "CKMB", "CKMBINDEX", "TROPONINI" in the last 168 hours.  HbA1C: Hemoglobin A1C  Date/Time Value Ref Range Status  05/04/2022 04:08 PM 8.9 (A) 4.0 - 5.6 % Final  12/18/2021 02:40 PM 9.6 (A) 4.0 - 5.6 % Final   Hgb A1c MFr Bld  Date/Time Value Ref Range Status  11/26/2022 06:19 AM 7.5 (H) 4.8 - 5.6 % Final    Comment:    (NOTE) Pre diabetes:          5.7%-6.4%  Diabetes:              >6.4%  Glycemic control for   <7.0% adults with diabetes   12/10/2020 02:54 PM 14.4 (H) 4.6 - 6.5 % Final    Comment:    Glycemic Control Guidelines for People with Diabetes:Non Diabetic:  <6%Goal of Therapy: <7%Additional Action Suggested:  >8%     CBG: Recent Labs  Lab 11/30/22 2310 12/01/22 0004 12/01/22 0154 12/01/22 0358 12/01/22 0623  GLUCAP 123* 125* 115* 122* 139*    Review of Systems:   As above  Past Medical History:  She,  has a past medical history of CTS (carpal tunnel syndrome), DM (diabetes mellitus) (HCC), Dyslipidemia, Heart murmur, HTN (hypertension), Hypercholesterolemia, Seasonal allergies, Smoker, and Warts, genital.   Surgical History:   Past Surgical History:  Procedure Laterality Date   COLONOSCOPY     CORONARY  ARTERY BYPASS GRAFT N/A 11/30/2022   Procedure: CORONARY ARTERY BYPASS GRAFTING X 2 , USING LEFT INTERNAL MAMMARY ARTERY AND ENDOSCOPICALLY HARVESTED RIGHT SAPHENOUS VEIN GRAFT;  Surgeon: [name], MD;  Location: [facility] OR;  Service: Open Heart Surgery;  Laterality: N/A;   CYST EXCISION Right 03/26/2021   Procedure: RIGHT AXILLARY SEBACEOUS CYST EXCISION;  Surgeon: Emelia Loron, MD;  Location: [facility];  Service: General;  Laterality: Right;  LOCAL   DILATION AND CURETTAGE OF UTERUS     LEFT HEART CATH AND CORONARY ANGIOGRAPHY N/A 11/26/2022   Procedure: LEFT HEART CATH AND CORONARY ANGIOGRAPHY;  Surgeon: Iran Ouch, MD;  Location: [facility];  Service: Cardiovascular;  Laterality: N/A;   TEE WITHOUT CARDIOVERSION N/A 11/30/2022   Procedure: TRANSESOPHAGEAL ECHOCARDIOGRAM (TEE);  Surgeon: [name], MD;  Location: [facility] OR;  Service: Open Heart Surgery;  Laterality: N/A;   WISDOM TOOTH EXTRACTION       Social History:   reports that she quit smoking about 15 years ago. Her smoking use included cigarettes. She started smoking about 41 years ago. She has a 26 pack-year smoking history.  She has never used smokeless tobacco. She reports current alcohol use. She reports that she does not use drugs.   Family History:  Her family history includes Breast cancer in her mother; Cancer in her mother; Colon cancer in her paternal grandmother; Coronary artery disease in her father; Heart disease in her father and mother.   Allergies No Known Allergies   Home Medications  Prior to Admission medications   Medication Sig Start Date End Date Taking? Authorizing Provider  acetaminophen (TYLENOL) 500 MG tablet Take 1,000 mg by mouth every 6 (six) hours as needed for mild pain, moderate pain, fever or headache.   Yes [provider]  aspirin 81 MG EC tablet Take 81 mg by mouth daily.   Yes [provider]  atorvastatin (LIPITOR) 80 MG  tablet Take 1 tablet (80 mg total) by mouth daily. Due for physical. 10/01/22  Yes [place], Betty G, MD  calcium carbonate (OSCAL) 1500 (600 Ca) MG TABS tablet Take 1,200 mg of elemental calcium by mouth daily with breakfast.   Yes [provider]  ezetimibe (ZETIA) 10 MG tablet Take 1 tablet (10 mg total) by mouth daily. 09/20/22  Yes Carlus Pavlov, MD  ferrous sulfate 325 (65 FE) MG tablet Take 325 mg by mouth daily with breakfast.   Yes [provider]  Fexofenadine HCl (ALLEGRA PO) Take 120 mg by mouth daily.   Yes [provider]  insulin regular human CONCENTRATED (HUMULIN R U-500 KWIKPEN) 500 UNIT/ML KwikPen Inject 80-100 Units into the skin 3 (three) times daily before meals. Patient taking differently: Inject 100 Units into the skin 3 (three) times daily before meals. 09/14/22  Yes Carlus Pavlov, MD  lisinopril-hydrochlorothiazide (ZESTORETIC) 20-12.5 MG tablet TAKE 1 TABLET BY MOUTH EVERY DAY 05/24/22  Yes [place], Betty G, MD  Loratadine (CLARITIN PO) Take 10 mg by mouth daily.   Yes [provider]  magnesium oxide (MAG-OX) 400 MG tablet Take 400 mg by mouth daily.   Yes [provider]  megestrol (MEGACE) 40 MG tablet Take 40 mg by mouth daily. TAKE ONE TABLET BY MOUTH TWO TIMES A DAY FOR 3 DAYS AND THEN 1 TABLET DAILY FOR 15 DAYS 11/22/22  Yes [provider]  Multiple Vitamin (MULTIVITAMIN WITH MINERALS) TABS tablet Take 1 tablet by mouth daily.   Yes [provider]  Potassium 99 MG TABS Take 1 tablet by mouth daily.   Yes [provider]  tirzepatide Greggory Keen) 10 MG/0.5ML Pen Inject 10 mg into the skin once a week. Patient taking differently: Inject 10 mg into the skin once a week. Thursday 09/14/22  Yes Carlus Pavlov, MD  zinc gluconate 50 MG tablet Take 50 mg by mouth daily.   Yes [provider]  Continuous Glucose Sensor (FREESTYLE LIBRE 3 SENSOR) MISC Place 1 sensor on the skin every 14 days.  Use to check glucose continuously 09/14/22   Carlus Pavlov, MD  Insulin Pen Needle 32G X 4 MM MISC Use 4x a day 05/18/21   Carlus Pavlov, MD     Critical care time: na    Lenard Galloway [facility] 12/01/22 6:56 AM  Please see [email or web address] for pager details.  From 7A-7P if no response, please call [phone] After hours, please call ELink [phone]

## 2022-12-01 NOTE — Progress Notes (Signed)
Patient ID: Catherine Moon, female   DOB: 1960/05/25, 62 y.o.   MRN: [id number]  TCTS Evening Rounds:  Hemodynamically stable in sinus rhythm.  Good UO.  CT output low.  BMET    Component Value Date/Time   NA 134 (L) 12/01/2022 1617   K 4.3 12/01/2022 1617   CL 103 12/01/2022 1617   CO2 23 12/01/2022 1617   GLUCOSE 246 (H) 12/01/2022 1617   GLUCOSE 282 (H) 11/05/2005 1108   BUN 10 12/01/2022 1617   CREATININE 0.78 12/01/2022 1617   CALCIUM 8.5 (L) 12/01/2022 1617   GFRNONAA >60 12/01/2022 1617

## 2022-12-01 NOTE — Discharge Summary (Signed)
[address]       [place] [address]             [phone]    Physician Discharge Summary  Patient ID: Catherine Moon MRN: [id number] DOB/AGE: November 12, 1960 62 y.o.  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, with long-term current use of insulin (HCC) 02/10/2019   Hand pain, right 03/12/2014   Routine general medical examination at a health care facility 03/14/2013   Pain of left heel 11/23/2011   Menopausal state 09/23/2011   CHEST PAIN 12/18/2009   TOBACCO USE, QUIT 12/18/2009   Hyperlipidemia associated with type 2 diabetes mellitus (HCC) 06/25/2008   Allergic rhinitis 04/04/2007   Essential hypertension 08/20/2006    Discharge Diagnoses:  Patient Active Problem List   Diagnosis Date Noted   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   Unstable angina (HCC) 11/26/2022   NSTEMI (non-ST elevated myocardial infarction) (HCC) 11/25/2022   (HFpEF) heart failure with preserved ejection fraction (HCC) 02/23/2021   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 is a 62 yo  woman with multiple cardiac risk factors including insulin dependent DM, hyperlipidemia, hypertension, hx of smoking and obesity.  She has known mild AS.  She presented to the ED with 6 day history of stuttering CP.  Finally this did not resolve and became severe 10/10 pain.  She was rule in for non STEMI.  She underwent cardiac cath which revealed a 70% left main stenosis in a left dominant circulation.  EF 50-55%.  LVEDP elevated at 33 mmHg. Echocardiogram showed LVEF 55-60%, a functionally bicuspid aortic valve with trivial aortic valve regurgitation, no evidence of aortic stenosis, and trivial mitral valve regurgitation.   Dr. Cliffton Asters and Dr. Dorris Fetch reviewed the patient's diagnostic studies and determined she would benefit from surgical intervention. The treatment options as well as the risks and benefits of surgery were reviewed with the patient. [name] was agreeable to proceed with surgery.   Hospital Course: Ms. Sedwick was admitted to [facility] and remained in stable condition. She was brought to the operating room on 11/30/22. She underwent CABG x 2 utilizing LIMA to LAD and SVG to OM as well as endoscopic harvest of the right greater saphenous vein. She tolerated the procedure well and was transferred to the SICU in stable condition. She was extubated the afternoon of surgery without complication. Drips were weaned as hemodynamics tolerated on POD1, arterial line was removed without complication. Epicardial wires were removed without complication. She was started on Plavix for prior NSTEMI. She had a history of chronic anemia with B12 and iron deficiency, IM B12 daily and  ferrous sulfate 325mg  daily was started. She was routinely diuresed. She was felt stable for transfer to the progressive unit. She was saturating well on room air. Norvasc was added for improved blood pressure control. She was deconditioned, PT/OT recommended a rolling walker and bedside commode which were  ordered. Her ambulation was progressing well on room air. She has a history of chronic leukocytosis and was recommended to follow up with her PCP. She has severe insulin resistance and was recommended to continue close follow up with her endocrinologist. Her incisions were healing well without sign of infection. She was felt stable for discharge.   Consults:  Critical care  Significant Diagnostic Studies:  LEFT HEART CATH AND CORONARY ANGIOGRAPHY     Prox RCA lesion is 60% stenosed.   Prox RCA to Mid RCA lesion is 90% stenosed.   Prox Cx to Mid Cx lesion is 30% stenosed.   Prox LAD to Mid LAD lesion is 30% stenosed.   Mid LAD lesion is 20% stenosed.   Ost LM lesion is 40% stenosed.   Mid LM to Dist LM lesion is 70% stenosed.   LPAV lesion is 60% stenosed.   LV end diastolic pressure is severely elevated.   The left ventricular ejection fraction is 50-55% by visual estimate.  ECHOCARDIOGRAM REPORT    Patient Name:   Catherine Moon Date of Exam: 11/26/2022 Medical Rec #:  [id number]               Height:       66.0 in Accession #:    [id number]              Weight:       239.9 lb Date of Birth:  July 27, 1960               BSA:          2.160 m Patient Age:    62 years                BP:           128/55 mmHg Patient Gender: F                       HR:           85 bpm. Exam Location:  Inpatient  Procedure: 2D Echo, Color Doppler and Cardiac Doppler  Indications:    NSTEMI   History:        Patient has prior history of Echocardiogram examinations, most                 recent 01/06/2021. CHF, Acute MI; Risk Factors:Hypertension,                 Dyslipidemia and Diabetes.   Sonographer:    Milbert Coulter Referring Phys: [id number] JONATHAN SEGARS  IMPRESSIONS   1. Left ventricular ejection fraction, by estimation, is 55 to 60%. The left ventricle has normal function. The left ventricle has no regional wall motion abnormalities. There is moderate asymmetric left  ventricular hypertrophy of the basal-septal segment. Left ventricular diastolic parameters were normal.  2. Right ventricular systolic function is normal. The right ventricular size is normal.  3. The mitral valve is normal in structure. Trivial mitral valve regurgitation. No evidence of mitral stenosis. Moderate to severe mitral annular calcification.  4. Functionally bicuspid aortic valve with calcification/partial fusion of LCC and NCC. The aortic valve is abnormal. There is moderate calcification of the aortic  valve. There is moderate thickening of the aortic valve. Aortic valve regurgitation is trivial. Aortic valve sclerosis/calcification is present, without any evidence of aortic stenosis.  Comparison(s): No significant change from prior study.  Conclusion(s)/Recommendation(s): Otherwise normal echocardiogram, with minor abnormalities described in the report.  FINDINGS  Left Ventricle: Left ventricular ejection fraction, by estimation, is 55 to 60%. The left ventricle has normal function. The left ventricle has no regional wall motion abnormalities. The left ventricular internal cavity size was normal in size. There is  moderate asymmetric left ventricular hypertrophy of the basal-septal segment. Left ventricular diastolic parameters were normal.  Right Ventricle: The right ventricular size is normal. No increase in right ventricular wall thickness. Right ventricular systolic function is normal.  Left Atrium: Left atrial size was normal in size.  Right Atrium: Right atrial size was normal in size.  Pericardium: There is no evidence of pericardial effusion.  Mitral Valve: The mitral valve is normal in structure. Moderate to severe mitral annular calcification. Trivial mitral valve regurgitation. No evidence of mitral valve stenosis.  Tricuspid Valve: The tricuspid valve is normal in structure. Tricuspid valve regurgitation is trivial. No evidence of tricuspid  stenosis.  Aortic Valve: Functionally bicuspid aortic valve with calcification/partial fusion of LCC and NCC. The aortic valve is abnormal. There is moderate calcification of the aortic valve. There is moderate thickening of the aortic valve. Aortic valve regurgitation is trivial. Aortic valve sclerosis/calcification is present, without any evidence of aortic stenosis. Aortic valve mean gradient measures 8.0 mmHg. Aortic valve peak gradient measures 14.0 mmHg. Aortic valve area, by VTI measures 2.15 cm.  Pulmonic Valve: The pulmonic valve was grossly normal. Pulmonic valve regurgitation is not visualized. No evidence of pulmonic stenosis.  Aorta: The aortic root, ascending aorta and aortic arch are all structurally normal, with no evidence of dilitation or obstruction.  Venous: The inferior vena cava was not well visualized.  IAS/Shunts: The atrial septum is grossly normal.    LEFT VENTRICLE PLAX 2D LVIDd:         4.20 cm   Diastology LVIDs:         2.90 cm   LV e' medial:    8.05 cm/s LV PW:         1.30 cm   LV E/e' medial:  14.0 LV IVS:        1.40 cm   LV e' lateral:   8.16 cm/s LVOT diam:     2.00 cm   LV E/e' lateral: 13.8 LV SV:         76 LV SV Index:   35 LVOT Area:     3.14 cm    RIGHT VENTRICLE RV Basal diam:  3.10 cm RV Mid diam:    3.00 cm RV S prime:     12.60 cm/s TAPSE (M-mode): 2.0 cm  LEFT ATRIUM             Index        RIGHT ATRIUM           Index LA diam:        4.40 cm 2.04 cm/m   RA Area:     14.80 cm LA Vol (A2C):   41.3 ml 19.12 ml/m  RA Volume:   39.00 ml  18.05 ml/m LA Vol (A4C):   37.2 ml 17.22 ml/m LA Biplane Vol: 39.6 ml 18.33 ml/m  AORTIC VALVE AV Area (Vmax):    2.02 cm AV Area (Vmean):   2.02 cm AV Area (VTI):  2.15 cm AV Vmax:           187.00 cm/s AV Vmean:          128.000 cm/s AV VTI:            0.355 m AV Peak Grad:      14.0 mmHg AV Mean Grad:      8.0 mmHg LVOT Vmax:         120.00 cm/s LVOT Vmean:         82.300 cm/s LVOT VTI:          0.243 m LVOT/AV VTI ratio: 0.68   AORTA Ao Root diam: 2.90 cm Ao Asc diam:  3.00 cm  MITRAL VALVE MV Area (PHT): 3.74 cm     SHUNTS MV Decel Time: 203 msec     Systemic VTI:  0.24 m MV E velocity: 113.00 cm/s  Systemic Diam: 2.00 cm MV A velocity: 111.00 cm/s MV E/A ratio:  1.02  Jodelle Red MD Electronically signed by Jodelle Red MD Signature Date/Time: 11/26/2022/6:15:34 PM       Final     Treatments: surgery: 11/30/2022 Patient:  [name] Pre-Op Dx: Left Main CAD HTN HLP Obesity DM   Post-op Dx:  same Procedure: CABG X 2.  LIMA LAD, RSVG PLV   Endoscopic greater saphenous vein harvest on the right     Surgeon and Role:      * Lightfoot, Eliezer Lofts, MD - Primary   Discharge Exam: Blood pressure (!) 122/54, pulse 93, temperature 98.3 F (36.8 C), temperature source Oral, resp. rate 20, height 5\' 6"  (1.676 m), weight 105.2 kg, SpO2 96%. General appearance: alert, cooperative, and no distress Neurologic: intact Heart: regular rate and rhythm, S1, S2 normal, no murmur, click, rub or gallop Lungs: slightly diminished bibasilar breath sounds Abdomen: soft, non-tender; bowel sounds normal; no masses,  no organomegaly Extremities: edema 1+ Wound: Sternal and EVH site are clean and dry without sign of infection   Discharge Medications:  The patient has been discharged on:   1.Beta Blocker:  Yes [ X  ]                              No   [   ]                              If No, reason:  2.Ace Inhibitor/ARB: Yes [   ]                                     No  [   X ]                                     If No, reason: BP controlled on Lopressor and Norvasc as recommended by the surgeon, cardiology recommended starting ACE/ARB as an outpatient  3.Statin:   Yes [  X ]                  No  [   ]                  If No, reason:  4.Ecasa:  Yes  [  X ]  No   [   ]                  If  No, reason:  Patient had ACS upon admission: Yes  Plavix/P2Y12 inhibitor: Yes [  X ]                                      No  [   ]     Discharge Instructions     AMB referral to Phase II Cardiac Rehabilitation   Complete by: As directed    Diagnosis: NSTEMI   After initial evaluation and assessments completed: Virtual Based Care may be provided alone or in conjunction with Phase 2 Cardiac Rehab based on patient barriers.: Yes   Intensive Cardiac Rehabilitation (ICR) [facility] location only OR Traditional Cardiac Rehabilitation (TCR) *If criteria for ICR are not met will enroll in TCR [facility] only): Yes      Allergies as of 12/04/2022   No Known Allergies      Medication List     STOP taking these medications    lisinopril-hydrochlorothiazide 20-12.5 MG tablet Commonly known as: ZESTORETIC       TAKE these medications    acetaminophen 500 MG tablet Commonly known as: TYLENOL Take 1,000 mg by mouth every 6 (six) hours as needed for mild pain, moderate pain, fever or headache.   ALLEGRA PO Take 120 mg by mouth daily.   amLODipine 5 MG tablet Commonly known as: NORVASC Take 1 tablet (5 mg total) by mouth daily.   aspirin EC 81 MG tablet Take 81 mg by mouth daily.   atorvastatin 80 MG tablet Commonly known as: LIPITOR Take 1 tablet (80 mg total) by mouth daily. Due for physical.   calcium carbonate 1500 (600 Ca) MG Tabs tablet Commonly known as: OSCAL Take 1,200 mg of elemental calcium by mouth daily with breakfast.   CLARITIN PO Take 10 mg by mouth daily.   clopidogrel 75 MG tablet Commonly known as: PLAVIX Take 1 tablet (75 mg total) by mouth daily.   ezetimibe 10 MG tablet Commonly known as: ZETIA Take 1 tablet (10 mg total) by mouth daily.   ferrous sulfate 325 (65 FE) MG tablet Take 325 mg by mouth daily with breakfast.   FreeStyle Libre 3 Sensor Misc Place 1 sensor on the skin every 14 days. Use to check glucose continuously   furosemide 40 MG  tablet Commonly known as: LASIX Take 1 tablet (40 mg total) by mouth daily. Take for 5 days then stop   HumuLIN R U-500 KwikPen 500 UNIT/ML KwikPen Generic drug: insulin regular human CONCENTRATED Inject 80-100 Units into the skin 3 (three) times daily before meals. What changed: how much to take   Insulin Pen Needle 32G X 4 MM Misc Use 4x a day   magnesium oxide 400 MG tablet Commonly known as: MAG-OX Take 400 mg by mouth daily.   megestrol 40 MG tablet Commonly known as: MEGACE Take 40 mg by mouth daily. TAKE ONE TABLET BY MOUTH TWO TIMES A DAY FOR 3 DAYS AND THEN 1 TABLET DAILY FOR 15 DAYS   metoprolol tartrate 50 MG tablet Commonly known as: LOPRESSOR Take 1 tablet (50 mg total) by mouth 2 (two) times daily.   Mounjaro 10 MG/0.5ML Pen Generic drug: tirzepatide Inject 10 mg into the skin once a week. What changed: additional instructions   multivitamin with minerals  Tabs tablet Take 1 tablet by mouth daily.   oxyCODONE 5 MG immediate release tablet Commonly known as: Oxy IR/ROXICODONE Take 1 tablet (5 mg total) by mouth every 3 (three) hours as needed for severe pain (pain score 7-10).   Potassium 99 MG Tabs Take 1 tablet (99 mg total) by mouth daily. Start taking on: December 10, 2022 What changed: These instructions start on December 10, 2022. If you are unsure what to do until then, ask your doctor or other care provider.   potassium chloride SA 20 MEQ tablet Commonly known as: KLOR-CON M Take 1 tablet (20 mEq total) by mouth daily. Take for 5 days then stop   zinc gluconate 50 MG tablet Take 50 mg by mouth daily.               Durable Medical Equipment  (From admission, onward)           Start     Ordered   12/04/22 0735  For home use only DME Walker rolling  Once       Question Answer Comment  Walker: With 5 Inch Wheels   Patient needs a walker to treat with the following condition Physical deconditioning   Patient needs a walker to treat with  the following condition S/P CABG x 2      12/04/22 0734   12/03/22 1821  For home use only DME 3 n 1  Once        12/03/22 1820            Follow-up Information     [name], MD Follow up on 12/17/2022.   Specialty: Cardiothoracic Surgery Why: Virtual appointment is at 2:10PM. Please do NOT come to the office as this is a VIRTUAL appointment. Dr. Cliffton Asters will call you. Contact information: [address] [place] [address] [phone]         Jodelle Gross, NP Follow up.   Specialties: Cardiology, Radiology, Cardiology Why: Monday Dec 20, 2022 Appt at 10:05 AM (25 min) Contact information: [address] [place] [address] [phone]         [place], Betty G, MD. Schedule an appointment as soon as possible for a visit.   Specialty: Family Medicine Contact information: [address] [place] [address] [phone]                 Signed:  Jenny Reichmann, PA-C  12/04/2022, 9:34 AM

## 2022-12-02 ENCOUNTER — Inpatient Hospital Stay (HOSPITAL_COMMUNITY): Payer: BC Managed Care – PPO

## 2022-12-02 DIAGNOSIS — I200 Unstable angina: Secondary | ICD-10-CM | POA: Diagnosis not present

## 2022-12-02 LAB — BASIC METABOLIC PANEL
Anion gap: 7 (ref 5–15)
BUN: 11 mg/dL (ref 8–23)
CO2: 22 mmol/L (ref 22–32)
Calcium: 8.3 mg/dL — ABNORMAL LOW (ref 8.9–10.3)
Chloride: 105 mmol/L (ref 98–111)
Creatinine, Ser: 0.67 mg/dL (ref 0.44–1.00)
GFR, Estimated: >60 mL/min (ref >60)
Glucose, Bld: 198 mg/dL — ABNORMAL HIGH (ref 70–99)
Potassium: 3.9 mmol/L (ref 3.5–5.1)
Sodium: 134 mmol/L — ABNORMAL LOW (ref 135–145)

## 2022-12-02 LAB — CBC
HCT: 30.2 % — ABNORMAL LOW (ref 36.0–46.0)
Hemoglobin: 9.5 g/dL — ABNORMAL LOW (ref 12.0–15.0)
MCH: 30.3 pg (ref 26.0–34.0)
MCHC: 31.5 g/dL (ref 30.0–36.0)
MCV: 96.2 fL (ref 80.0–100.0)
Platelets: 284 10*3/uL (ref 150–400)
RBC: 3.14 MIL/uL — ABNORMAL LOW (ref 3.87–5.11)
RDW: 18.1 % — ABNORMAL HIGH (ref 11.5–15.5)
WBC: 15 10*3/uL — ABNORMAL HIGH (ref 4.0–10.5)
nRBC: 0 % (ref 0.0–0.2)

## 2022-12-02 LAB — ECHO INTRAOPERATIVE TEE
AR max vel: 1.66 cm2
AV Area VTI: 1.46 cm2
AV Area mean vel: 1.35 cm2
AV Mean grad: 8 mm[Hg]
AV Peak grad: 10.6 mm[Hg]
Ao pk vel: 1.63 m/s
Height: 66 in
S' Lateral: 2.7 cm
Weight: 3667.2 [oz_av]

## 2022-12-02 LAB — GLUCOSE, CAPILLARY
Glucose-Capillary: 204 mg/dL — ABNORMAL HIGH (ref 70–99)
Glucose-Capillary: 215 mg/dL — ABNORMAL HIGH (ref 70–99)
Glucose-Capillary: 254 mg/dL — ABNORMAL HIGH (ref 70–99)
Glucose-Capillary: 255 mg/dL — ABNORMAL HIGH (ref 70–99)
Glucose-Capillary: 263 mg/dL — ABNORMAL HIGH (ref 70–99)
Glucose-Capillary: 272 mg/dL — ABNORMAL HIGH (ref 70–99)

## 2022-12-02 MED ORDER — POTASSIUM CHLORIDE CRYS ER 20 MEQ PO TBCR
40.0000 meq | EXTENDED_RELEASE_TABLET | Freq: Every day | ORAL | Status: DC
  Administered 2022-12-02 – 2022-12-04 (×3): 40 meq via ORAL
  Filled 2022-12-02 (×3): qty 2

## 2022-12-02 MED ORDER — ~~LOC~~ CARDIAC SURGERY, PATIENT & FAMILY EDUCATION: Freq: Once | Status: AC

## 2022-12-02 MED ORDER — INSULIN DETEMIR 100 UNIT/ML ~~LOC~~ SOLN
50.0000 [IU] | Freq: Two times a day (BID) | SUBCUTANEOUS | Status: DC
  Administered 2022-12-02 – 2022-12-04 (×5): 50 [IU] via SUBCUTANEOUS
  Filled 2022-12-02 (×9): qty 0.5

## 2022-12-02 MED ORDER — METOPROLOL TARTRATE 50 MG PO TABS
50.0000 mg | ORAL_TABLET | Freq: Two times a day (BID) | ORAL | Status: DC
  Administered 2022-12-02 (×2): 50 mg via ORAL
  Filled 2022-12-02 (×2): qty 1

## 2022-12-02 MED ORDER — INSULIN ASPART 100 UNIT/ML IJ SOLN
12.0000 [IU] | Freq: Three times a day (TID) | INTRAMUSCULAR | Status: DC
  Administered 2022-12-02 – 2022-12-03 (×4): 12 [IU] via SUBCUTANEOUS

## 2022-12-02 MED ORDER — FUROSEMIDE 40 MG PO TABS
40.0000 mg | ORAL_TABLET | Freq: Every day | ORAL | Status: DC
  Administered 2022-12-03 – 2022-12-04 (×2): 40 mg via ORAL
  Filled 2022-12-02 (×2): qty 1

## 2022-12-02 MED ORDER — FUROSEMIDE 40 MG PO TABS: 40.0000 mg | ORAL_TABLET | Freq: Every day | ORAL | Status: DC

## 2022-12-02 MED ORDER — FUROSEMIDE 10 MG/ML IJ SOLN
40.0000 mg | Freq: Once | INTRAMUSCULAR | Status: AC
  Administered 2022-12-02: 40 mg via INTRAVENOUS
  Filled 2022-12-02: qty 4

## 2022-12-02 NOTE — Progress Notes (Signed)
NAME:  Catherine Moon, MRN:  [id number], DOB:  [birth year], LOS: 7 ADMISSION DATE:  11/25/2022, CONSULTATION DATE:  11/26 REFERRING MD:  Nat Christen, CHIEF COMPLAINT:  s/p CABG   History of Present Illness:  62 year old female with past medical history of hypertension, hyperlipidemia, type 2 diabetes who presented to the emergency department on 11/25/22 with complaint of chest pain. Initially described as intermittent, exertional pain with radiation to the neck and shoulders. Troponins at that time 138>140. No EKG changes. She was admitted to hospitalist for NSTEMI. Cardiology evaluated the patient, recommending cardiac cath. Cath showed 70% distal left main stenosis, severe disease of small non-dominant RCA. CABG was recommended and cardiothoracic surgery was consulted.   11/26: CABG x2 LIMA LAD, RSVG PLV Cross clamp time 32 minutes Pump time 62 minutes 1U PRBC PCCM consulted for post-operative management  Pertinent  Medical History  hypertension, hyperlipidemia, type 2 diabetes  Significant Hospital Events: Including procedures, antibiotic start and stop dates in addition to other pertinent events   11/21: admit for NSTEMI  11/22: cath with 70% distal left main stenosis, severe small non-dominant RCA. Echo LVEF 55-60%, bicuspid aortic valve, trivial regurgitation, no stenosis  11/26: s/p CABG x2, extubated without incident 11/27: +robaxin for Left lower pain near chest tube. Working on IS, OOB.  Interim History / Subjective:  She denies complaints. Her appetite is back today. She has been walking.  Afebrile overnight.  Objective   Blood pressure (!) 141/56, pulse 89, temperature 98.8 F (37.1 C), temperature source Oral, resp. rate (!) 25, height 5\' 6"  (1.676 m), weight 107 kg, SpO2 96%.        Intake/Output Summary (Last 24 hours) at 12/02/2022 0715 Last data filed at 12/01/2022 2300 Gross per 24 hour  Intake 231.27 ml  Output 1050 ml  Net -818.73 ml   Filed  Weights   11/30/22 0513 12/01/22 0500 12/02/22 0500  Weight: 104 kg 107.6 kg 107 kg    Examination: General: chronically ill appearing woman sitting up in bed in NAD HENT: [place]/AT, eyes anicteric Lungs: breathing comfortably on Vader, reduced basilar breath sounds. No conversational dyspnea.   Cardiovascular: midline incision without bleeding. S1S2, RRR Abdomen: soft, NT Extremities:  mild edema, no cyanosis Neuro: awake, alert, moving extremities, answering questions appropriately.  Derm: hirsuitism  Bun 11 Cr 0.67 WBC 15 H/H 9.5/30.2 Platelets 284 CXR personally reviewed> bilateral effusions, pulmonary edema   Resolved Hospital Problem list     Assessment & Plan:  CAD s/p CABG x2 LIMA LAD, RSVG PLV; cross clamp times 32 minutes, pump time 62 minutes.  NSTEMI  -con't DAPT -increase metoprolol -lasix 40mg  today -zetia and atorvastatin -tele monitoring -post-op pain control per protocol; added robaxin on 11/27  -progress mobility  Hypertension  -increase metoprolol -holding PTA lisinopril and hydrochlorothiazide   Hyperlipidemia  -con't zetia and atorvastatin  Diabetes; A1c 7.5; uncontrolled hyperglycemia. Severely insulin resistant; on U-500 insulin PTA. -PTA mounjaro held -increase long-acting insulin> back to levemir 50 units BID -adding mealtime 12 units aspart TIDAC -SSI PRN -goal BG 140-180> hasn't been there at all this admission  B12 deficiency  Iron deficiency  -con't iron daily  -received B12 injection  Best Practice (right click and "Reselect all SmartList Selections" daily)   Diet/type: Regular consistency (see orders) DVT prophylaxis: LMWH Pressure ulcer(s). pressure ulcer stage none was NOT present on admission GI prophylaxis: N/A Lines: Central line, Arterial Line, and yes and it is still needed Foley:  Yes, and it is still needed  Code Status:  full code Last date of multidisciplinary goals of care discussion []   Labs   CBC: Recent Labs   Lab 11/30/22 0446 11/30/22 0757 11/30/22 1030 11/30/22 1051 11/30/22 1217 11/30/22 1221 11/30/22 1442 11/30/22 1555 11/30/22 1823 12/01/22 0505 12/02/22 0410  WBC 10.0  --   --   --  14.8*  --   --   --  16.0* 15.4* 15.0*  HGB 10.1*   < > 5.3*   < > 9.3*   < > 9.2* 9.9* 9.8* 10.2* 9.5*  HCT 31.6*   < > 16.9*   < > 28.9*   < > 27.0* 29.0* 30.6* 30.9* 30.2*  MCV 97.5  --   --   --  97.0  --   --   --  93.9 94.8 96.2  PLT 427*  --  282  --  249  --   --   --  277 310 284   < > = values in this interval not displayed.    Basic Metabolic Panel: Recent Labs  Lab 11/26/22 0619 11/29/22 0446 11/30/22 0446 11/30/22 0757 11/30/22 1123 11/30/22 1221 11/30/22 1555 11/30/22 1823 12/01/22 0505 12/01/22 1617 12/02/22 0410  NA 135   < > 136   < > 133*   < > 142 139 137 134* 134*  K 3.9   < > 3.9   < > 4.6   < > 4.1 4.1 3.8 4.3 3.9  CL 103   < > 103   < > 100  --   --  111 109 103 105  CO2 22   < > 25  --   --   --   --  21* 20* 23 22  GLUCOSE 281*   < > 242*   < > 216*  --   --  134* 130* 246* 198*  BUN 9   < > 13   < > 12  --   --  10 10 10 11   CREATININE 0.67   < > 0.78   < > 0.70  --   --  0.69 0.61 0.78 0.67  CALCIUM 9.1   < > 9.5  --   --   --   --  8.3* 8.0* 8.5* 8.3*  MG 1.9  --   --   --   --   --   --  2.8* 2.2 2.2  --   PHOS 4.5  --   --   --   --   --   --   --   --   --   --    < > = values in this interval not displayed.   GFR: Estimated Creatinine Clearance: 91.4 mL/min (by C-G formula based on SCr of 0.67 mg/dL). Recent Labs  Lab 11/30/22 1217 11/30/22 1823 12/01/22 0505 12/02/22 0410  WBC 14.8* 16.0* 15.4* 15.0*   Critical care time:     Steffanie Dunn, DO [facility] 12/02/22 7:15 AM  Please see [email or web address] for pager details.  From 7A-7P if no response, please call [phone] After hours, please call ELink [phone]

## 2022-12-02 NOTE — Progress Notes (Signed)
   Patient Name: Catherine Moon Date of Encounter: 12/02/2022 [facility] Cardiologist: None   Interval Summary  .    No events overnight. No complaints.   Vital Signs .    Vitals:   12/02/22 0300 12/02/22 0330 12/02/22 0400 12/02/22 0500  BP: (!) 137/57  (!) 141/56   Pulse: 88  89   Resp: (!) 22  (!) 25   Temp:  98.8 F (37.1 C)    TempSrc:  Oral    SpO2: 96%  96%   Weight:    107 kg  Height:        Intake/Output Summary (Last 24 hours) at 12/02/2022 0716 Last data filed at 12/01/2022 2300 Gross per 24 hour  Intake 231.27 ml  Output 1050 ml  Net -818.73 ml      12/02/2022    5:00 AM 12/01/2022    5:00 AM 11/30/2022    5:13 AM  Last 3 Weights  Weight (lbs) 235 lb 14.3 oz 237 lb 3.4 oz 229 lb 3.2 oz  Weight (kg) 107 kg 107.6 kg 103.964 kg     Hospital Medications:   acetaminophen  1,000 mg Oral Q6H   aspirin EC  81 mg Oral Daily   atorvastatin  80 mg Oral Daily   bisacodyl  10 mg Oral Daily   Or   bisacodyl  10 mg Rectal Daily   Chlorhexidine Gluconate Cloth  6 each Topical Daily   clopidogrel  75 mg Oral Daily   docusate sodium  200 mg Oral Daily   enoxaparin (LOVENOX) injection  40 mg Subcutaneous QHS   ezetimibe  10 mg Oral Daily   insulin aspart  0-24 Units Subcutaneous Q4H   insulin detemir  25 Units Subcutaneous BID   methocarbamol (ROBAXIN) injection  1,000 mg Intravenous Q8H   metoprolol tartrate  25 mg Oral BID   sodium chloride flush  3 mL Intravenous Q12H     Telemetry/ECG    Sinus   Physical Exam .   GEN: NAD Neck: No JVD Cardiac: RRR, no murmurs Pulm: Clear bilaterally MV:HQIO, NT Ext: No LE edema.   Assessment & Plan .     NSTEMI: POD #2 from CABG with LIMA-LAD, SVG-PLA.  Continue ASA, Plavix, beta blocker, statin, Zetia.   HTN: BP stable.    For questions or updates, please contact [facility] Please consult [email or web address] for contact info under      Signed, Verne Carrow, MD

## 2022-12-02 NOTE — Plan of Care (Signed)
  Problem: Education: Goal: Knowledge of General Education information will improve Description: Including pain rating scale, medication(s)/side effects and non-pharmacologic comfort measures Outcome: Progressing   Problem: Health Behavior/Discharge Planning: Goal: Ability to manage health-related needs will improve Outcome: Progressing   Problem: Clinical Measurements: Goal: Ability to maintain clinical measurements within normal limits will improve Outcome: Progressing Goal: Will remain free from infection Outcome: Progressing Goal: Diagnostic test results will improve Outcome: Progressing Goal: Respiratory complications will improve Outcome: Progressing Goal: Cardiovascular complication will be avoided Outcome: Progressing   Problem: Activity: Goal: Risk for activity intolerance will decrease Outcome: Progressing   Problem: Nutrition: Goal: Adequate nutrition will be maintained Outcome: Progressing   Problem: Coping: Goal: Level of anxiety will decrease Outcome: Progressing   Problem: Elimination: Goal: Will not experience complications related to bowel motility Outcome: Progressing Goal: Will not experience complications related to urinary retention Outcome: Progressing   Problem: Pain Management: Goal: General experience of comfort will improve Outcome: Progressing   Problem: Safety: Goal: Ability to remain free from injury will improve Outcome: Progressing   Problem: Skin Integrity: Goal: Risk for impaired skin integrity will decrease Outcome: Progressing   Problem: Education: Goal: Ability to describe self-care measures that may prevent or decrease complications (Diabetes Survival Skills Education) will improve Outcome: Progressing Goal: Individualized Educational Video(s) Outcome: Progressing   Problem: Coping: Goal: Ability to adjust to condition or change in health will improve Outcome: Progressing   Problem: Fluid Volume: Goal: Ability to  maintain a balanced intake and output will improve Outcome: Progressing   Problem: Health Behavior/Discharge Planning: Goal: Ability to identify and utilize available resources and services will improve Outcome: Progressing Goal: Ability to manage health-related needs will improve Outcome: Progressing   Problem: Metabolic: Goal: Ability to maintain appropriate glucose levels will improve Outcome: Progressing   Problem: Nutritional: Goal: Maintenance of adequate nutrition will improve Outcome: Progressing Goal: Progress toward achieving an optimal weight will improve Outcome: Progressing   Problem: Skin Integrity: Goal: Risk for impaired skin integrity will decrease Outcome: Progressing   Problem: Education: Goal: Understanding of CV disease, CV risk reduction, and recovery process will improve Outcome: Progressing Goal: Individualized Educational Video(s) Outcome: Progressing   Problem: Activity: Goal: Ability to return to baseline activity level will improve Outcome: Progressing   Problem: Cardiovascular: Goal: Ability to achieve and maintain adequate cardiovascular perfusion will improve Outcome: Progressing Goal: Vascular access site(s) Level 0-1 will be maintained Outcome: Progressing   Problem: Health Behavior/Discharge Planning: Goal: Ability to safely manage health-related needs after discharge will improve Outcome: Progressing   Problem: Education: Goal: Will demonstrate proper wound care and an understanding of methods to prevent future damage Outcome: Progressing Goal: Knowledge of disease or condition will improve Outcome: Progressing Goal: Knowledge of the prescribed therapeutic regimen will improve Outcome: Progressing Goal: Individualized Educational Video(s) Outcome: Progressing   Problem: Activity: Goal: Risk for activity intolerance will decrease Outcome: Progressing   Problem: Cardiac: Goal: Will achieve and/or maintain hemodynamic  stability Outcome: Progressing   Problem: Clinical Measurements: Goal: Postoperative complications will be avoided or minimized Outcome: Progressing   Problem: Respiratory: Goal: Respiratory status will improve Outcome: Progressing   Problem: Skin Integrity: Goal: Wound healing without signs and symptoms of infection Outcome: Progressing Goal: Risk for impaired skin integrity will decrease Outcome: Progressing   Problem: Urinary Elimination: Goal: Ability to achieve and maintain adequate renal perfusion and functioning will improve Outcome: Progressing

## 2022-12-02 NOTE — Progress Notes (Signed)
      [address]       [place] [address]             [phone]                 2 Days Post-Op Procedure(s) (LRB): CORONARY ARTERY BYPASS GRAFTING X 2 , USING LEFT INTERNAL MAMMARY ARTERY AND ENDOSCOPICALLY HARVESTED RIGHT SAPHENOUS VEIN GRAFT (N/A) TRANSESOPHAGEAL ECHOCARDIOGRAM (TEE) (N/A)   Events: No events _______________________________________________________________ Vitals: BP 130/70 (BP Location: Right Arm)   Pulse 97   Temp 98.1 F (36.7 C) (Oral)   Resp (!) 9   Ht 5\' 6"  (1.676 m)   Wt 107 kg   SpO2 97%   BMI 38.07 kg/m  Filed Weights   11/30/22 0513 12/01/22 0500 12/02/22 0500  Weight: 104 kg 107.6 kg 107 kg     - Neuro: alert NAD  - Cardiovascular: sinus  Drips: none.      - Pulm: EWOB    ABG    Component Value Date/Time   PHART 7.340 (L) 11/30/2022 1555   PCO2ART 36.7 11/30/2022 1555   PO2ART 83 11/30/2022 1555   HCO3 19.7 (L) 11/30/2022 1555   TCO2 21 (L) 11/30/2022 1555   ACIDBASEDEF 5.0 (H) 11/30/2022 1555   O2SAT 95 11/30/2022 1555    - Abd: ND - Extremity: warm  .Intake/Output      11/27 0701 11/28 0700 11/28 0701 11/29 0700   I.V. (mL/kg) 24.9 (0.2)    Blood     IV Piggyback 206.4    Total Intake(mL/kg) 231.3 (2.2)    Urine (mL/kg/hr) 1030 (0.4)    Blood     Chest Tube 20    Total Output 1050    Net -818.7         Urine Occurrence 1 x       _______________________________________________________________ Labs:    Latest Ref Rng & Units 12/02/2022    4:10 AM 12/01/2022    5:05 AM 11/30/2022    6:23 PM  CBC  WBC 4.0 - 10.5 K/uL 15.0  15.4  16.0   Hemoglobin 12.0 - 15.0 g/dL 9.5  09.3  9.8   Hematocrit 36.0 - 46.0 % 30.2  30.9  30.6   Platelets 150 - 400 K/uL 284  310  277       Latest Ref Rng & Units 12/02/2022    4:10 AM 12/01/2022    4:17 PM 12/01/2022    5:05 AM  CMP  Glucose 70 - 99 mg/dL 818  299  371   BUN 8 - 23 mg/dL 11  10  10    Creatinine 0.44 - 1.00 mg/dL 6.96  7.89  3.81    Sodium 135 - 145 mmol/L 134  134  137   Potassium 3.5 - 5.1 mmol/L 3.9  4.3  3.8   Chloride 98 - 111 mmol/L 105  103  109   CO2 22 - 32 mmol/L 22  23  20    Calcium 8.9 - 10.3 mg/dL 8.3  8.5  8.0     CXR: clear  _______________________________________________________________  Assessment and Plan: POD 2 s/p CABG  Neuro: pain controlled CV: adjusting BP meds.  On A/S/BB and plavix Pulm: IS, ambulation Renal: creat stable, diuresing GI: on diet Heme: stable ID: afebrile Endo: BG elevated, adjusting meds Dispo: floor   Catherine Moon O Catherine Moon 12/02/2022 8:24 AM

## 2022-12-02 NOTE — Plan of Care (Signed)
  Problem: Education: Goal: Knowledge of General Education information will improve Description: Including pain rating scale, medication(s)/side effects and non-pharmacologic comfort measures Outcome: Progressing   Problem: Health Behavior/Discharge Planning: Goal: Ability to manage health-related needs will improve Outcome: Progressing   Problem: Clinical Measurements: Goal: Ability to maintain clinical measurements within normal limits will improve Outcome: Progressing Goal: Will remain free from infection Outcome: Progressing Goal: Diagnostic test results will improve Outcome: Progressing Goal: Respiratory complications will improve Outcome: Progressing Goal: Cardiovascular complication will be avoided Outcome: Progressing   Problem: Activity: Goal: Risk for activity intolerance will decrease Outcome: Progressing   Problem: Nutrition: Goal: Adequate nutrition will be maintained Outcome: Progressing   Problem: Coping: Goal: Level of anxiety will decrease Outcome: Progressing   Problem: Elimination: Goal: Will not experience complications related to bowel motility Outcome: Progressing Goal: Will not experience complications related to urinary retention Outcome: Progressing   Problem: Pain Management: Goal: General experience of comfort will improve Outcome: Progressing   Problem: Safety: Goal: Ability to remain free from injury will improve Outcome: Progressing   Problem: Skin Integrity: Goal: Risk for impaired skin integrity will decrease Outcome: Progressing   Problem: Education: Goal: Ability to describe self-care measures that may prevent or decrease complications (Diabetes Survival Skills Education) will improve Outcome: Progressing Goal: Individualized Educational Video(s) Outcome: Progressing   Problem: Coping: Goal: Ability to adjust to condition or change in health will improve Outcome: Progressing   Problem: Fluid Volume: Goal: Ability to  maintain a balanced intake and output will improve Outcome: Progressing   Problem: Health Behavior/Discharge Planning: Goal: Ability to identify and utilize available resources and services will improve Outcome: Progressing Goal: Ability to manage health-related needs will improve Outcome: Progressing   Problem: Metabolic: Goal: Ability to maintain appropriate glucose levels will improve Outcome: Progressing   Problem: Nutritional: Goal: Maintenance of adequate nutrition will improve Outcome: Progressing Goal: Progress toward achieving an optimal weight will improve Outcome: Progressing   Problem: Skin Integrity: Goal: Risk for impaired skin integrity will decrease Outcome: Progressing   Problem: Tissue Perfusion: Goal: Adequacy of tissue perfusion will improve Outcome: Progressing   Problem: Education: Goal: Understanding of CV disease, CV risk reduction, and recovery process will improve Outcome: Progressing Goal: Individualized Educational Video(s) Outcome: Progressing   Problem: Activity: Goal: Ability to return to baseline activity level will improve Outcome: Progressing   Problem: Cardiovascular: Goal: Ability to achieve and maintain adequate cardiovascular perfusion will improve Outcome: Progressing Goal: Vascular access site(s) Level 0-1 will be maintained Outcome: Progressing   Problem: Health Behavior/Discharge Planning: Goal: Ability to safely manage health-related needs after discharge will improve Outcome: Progressing   Problem: Education: Goal: Will demonstrate proper wound care and an understanding of methods to prevent future damage Outcome: Progressing Goal: Knowledge of disease or condition will improve Outcome: Progressing Goal: Knowledge of the prescribed therapeutic regimen will improve Outcome: Progressing Goal: Individualized Educational Video(s) Outcome: Progressing   Problem: Activity: Goal: Risk for activity intolerance will  decrease Outcome: Progressing   Problem: Cardiac: Goal: Will achieve and/or maintain hemodynamic stability Outcome: Progressing   Problem: Clinical Measurements: Goal: Postoperative complications will be avoided or minimized Outcome: Progressing   Problem: Respiratory: Goal: Respiratory status will improve Outcome: Progressing   Problem: Skin Integrity: Goal: Wound healing without signs and symptoms of infection Outcome: Progressing Goal: Risk for impaired skin integrity will decrease Outcome: Progressing   Problem: Urinary Elimination: Goal: Ability to achieve and maintain adequate renal perfusion and functioning will improve Outcome: Progressing

## 2022-12-03 ENCOUNTER — Other Ambulatory Visit (HOSPITAL_COMMUNITY): Payer: Self-pay

## 2022-12-03 DIAGNOSIS — I214 Non-ST elevation (NSTEMI) myocardial infarction: Secondary | ICD-10-CM | POA: Diagnosis not present

## 2022-12-03 LAB — CBC
HCT: 31.4 % — ABNORMAL LOW (ref 36.0–46.0)
Hemoglobin: 10 g/dL — ABNORMAL LOW (ref 12.0–15.0)
MCH: 30.5 pg (ref 26.0–34.0)
MCHC: 31.8 g/dL (ref 30.0–36.0)
MCV: 95.7 fL (ref 80.0–100.0)
Platelets: 339 10*3/uL (ref 150–400)
RBC: 3.28 MIL/uL — ABNORMAL LOW (ref 3.87–5.11)
RDW: 17.2 % — ABNORMAL HIGH (ref 11.5–15.5)
WBC: 13 10*3/uL — ABNORMAL HIGH (ref 4.0–10.5)
nRBC: 0 % (ref 0.0–0.2)

## 2022-12-03 LAB — GLUCOSE, CAPILLARY
Glucose-Capillary: 130 mg/dL — ABNORMAL HIGH (ref 70–99)
Glucose-Capillary: 165 mg/dL — ABNORMAL HIGH (ref 70–99)
Glucose-Capillary: 174 mg/dL — ABNORMAL HIGH (ref 70–99)
Glucose-Capillary: 284 mg/dL — ABNORMAL HIGH (ref 70–99)
Glucose-Capillary: 299 mg/dL — ABNORMAL HIGH (ref 70–99)

## 2022-12-03 LAB — BASIC METABOLIC PANEL
Anion gap: 7 (ref 5–15)
BUN: 16 mg/dL (ref 8–23)
CO2: 23 mmol/L (ref 22–32)
Calcium: 8.6 mg/dL — ABNORMAL LOW (ref 8.9–10.3)
Chloride: 108 mmol/L (ref 98–111)
Creatinine, Ser: 0.62 mg/dL (ref 0.44–1.00)
GFR, Estimated: >60 mL/min (ref >60)
Glucose, Bld: 142 mg/dL — ABNORMAL HIGH (ref 70–99)
Potassium: 3.8 mmol/L (ref 3.5–5.1)
Sodium: 138 mmol/L (ref 135–145)

## 2022-12-03 MED ORDER — POTASSIUM CHLORIDE CRYS ER 20 MEQ PO TBCR
20.0000 meq | EXTENDED_RELEASE_TABLET | Freq: Once | ORAL | Status: AC
  Administered 2022-12-03: 20 meq via ORAL
  Filled 2022-12-03: qty 1

## 2022-12-03 MED ORDER — INSULIN ASPART 100 UNIT/ML IJ SOLN
0.0000 [IU] | Freq: Three times a day (TID) | INTRAMUSCULAR | Status: DC
  Administered 2022-12-03 (×2): 11 [IU] via SUBCUTANEOUS
  Administered 2022-12-04: 3 [IU] via SUBCUTANEOUS

## 2022-12-03 MED ORDER — METOPROLOL TARTRATE 50 MG PO TABS: 75.0000 mg | ORAL_TABLET | Freq: Two times a day (BID) | ORAL | Status: DC

## 2022-12-03 MED ORDER — METOPROLOL TARTRATE 50 MG PO TABS
50.0000 mg | ORAL_TABLET | Freq: Two times a day (BID) | ORAL | Status: DC
  Administered 2022-12-03 – 2022-12-04 (×3): 50 mg via ORAL
  Filled 2022-12-03 (×3): qty 1

## 2022-12-03 MED ORDER — INSULIN ASPART 100 UNIT/ML IJ SOLN: 15.0000 [IU] | Freq: Three times a day (TID) | INTRAMUSCULAR | Status: DC

## 2022-12-03 MED ORDER — LACTULOSE 10 GM/15ML PO SOLN: 20.0000 g | Freq: Once | ORAL | Status: DC

## 2022-12-03 MED ORDER — INSULIN ASPART 100 UNIT/ML IJ SOLN
15.0000 [IU] | Freq: Three times a day (TID) | INTRAMUSCULAR | Status: DC
  Administered 2022-12-03: 15 [IU] via SUBCUTANEOUS

## 2022-12-03 MED ORDER — INSULIN ASPART 100 UNIT/ML IJ SOLN: 0.0000 [IU] | Freq: Three times a day (TID) | INTRAMUSCULAR | Status: DC

## 2022-12-03 MED ORDER — AMLODIPINE BESYLATE 5 MG PO TABS
5.0000 mg | ORAL_TABLET | Freq: Every day | ORAL | Status: DC
  Administered 2022-12-03 – 2022-12-04 (×2): 5 mg via ORAL
  Filled 2022-12-03 (×2): qty 1

## 2022-12-03 NOTE — Progress Notes (Signed)
Mobility Specialist Progress Note    12/03/22 1507  Mobility  Activity Ambulated with assistance in hallway  Level of Assistance Contact guard assist, steadying assist  Assistive Device Front wheel walker  Distance Ambulated (ft) 230 ft  Activity Response Tolerated well  Mobility Referral Yes  $Mobility charge 1 Mobility  Mobility Specialist Start Time (ACUTE ONLY) 1458  Mobility Specialist Stop Time (ACUTE ONLY) 1507  Mobility Specialist Time Calculation (min) (ACUTE ONLY) 9 min   During Mobility: 106 HR Post-Mobility: 95 HR  Pt received in chair and agreeable. Had her "usual" amount of SOB on walk. Returned to chair with call bell in reach.   [place] Mobility Specialist  Please Neurosurgeon or Rehab Office at [phone]

## 2022-12-03 NOTE — Progress Notes (Addendum)
[address]       [place] [address]             [phone]      3 Days Post-Op Procedure(s) (LRB): CORONARY ARTERY BYPASS GRAFTING X 2 , USING LEFT INTERNAL MAMMARY ARTERY AND ENDOSCOPICALLY HARVESTED RIGHT SAPHENOUS VEIN GRAFT (N/A) TRANSESOPHAGEAL ECHOCARDIOGRAM (TEE) (N/A) Subjective: Patient with no new complaints this AM. Is passing gas but -BM.   Objective: Vital signs in last 24 hours: Temp:  [98 F (36.7 C)-99 F (37.2 C)] 98.4 F (36.9 C) (11/29 0346) Pulse Rate:  [83-108] 83 (11/29 0346) Cardiac Rhythm: Normal sinus rhythm (11/28 1900) Resp:  [20-33] 22 (11/29 0422) BP: (96-170)/(58-71) 141/62 (11/29 0346) SpO2:  [95 %-99 %] 95 % (11/29 0346)  Hemodynamic parameters for last 24 hours:    Intake/Output from previous day: 11/28 0701 - 11/29 0700 In: 100 [IV Piggyback:100] Out: 900 [Urine:900] Intake/Output this shift: No intake/output data recorded.  General appearance: alert, cooperative, and no distress Neurologic: intact Heart: regular rate and rhythm, S1, S2 normal, no murmur, click, rub or gallop Lungs: slightly diminished bibasilar breath sounds Abdomen: soft, non-tender; bowel sounds normal; no masses,  no organomegaly Extremities: SCDs in place, 1+ edema Wound: EVH and sternal site are clean and dry without sign of infection  Lab Results: Recent Labs    12/02/22 0410 12/03/22 0320  WBC 15.0* 13.0*  HGB 9.5* 10.0*  HCT 30.2* 31.4*  PLT 284 339   BMET:  Recent Labs    12/02/22 0410 12/03/22 0320  NA 134* 138  K 3.9 3.8  CL 105 108  CO2 22 23  GLUCOSE 198* 142*  BUN 11 16  CREATININE 0.67 0.62  CALCIUM 8.3* 8.6*    PT/INR:  Recent Labs    11/30/22 1217  LABPROT 17.0*  INR 1.4*   ABG    Component Value Date/Time   PHART 7.340 (L) 11/30/2022 1555   HCO3 19.7 (L) 11/30/2022 1555   TCO2 21 (L) 11/30/2022 1555   ACIDBASEDEF 5.0 (H) 11/30/2022 1555   O2SAT 95 11/30/2022 1555   CBG (last 3)  Recent  Labs    12/02/22 2016 12/03/22 0023 12/03/22 0350  GLUCAP 272* 174* 130*    Assessment/Plan: S/P Procedure(s) (LRB): CORONARY ARTERY BYPASS GRAFTING X 2 , USING LEFT INTERNAL MAMMARY ARTERY AND ENDOSCOPICALLY HARVESTED RIGHT SAPHENOUS VEIN GRAFT (N/A) TRANSESOPHAGEAL ECHOCARDIOGRAM (TEE) (N/A)  Neuro: Pain is controlled this AM   CV: SBP 140s. On Lopressor 50mg  BID. Was on Zestoretic 20-12.5mg  daily at home. NSR, HR 90s. As discussed with Dr. Cliffton Asters will add Norvasc.   Pulm: Saturating well on RA today. Yesterdays CXR with mild interstitial thickening, bibasilar atelectasis and L>R small pleural effusions. Encourage IS and ambulation. Continue diuresis.    GI: Tolerating a diet. No nausea or vomiting. Passing gas, -BM. Continue stool softeners and increase ambulation today.   Endo: T2DM insulin dependent, preop A1C 7.5. Very insulin resistant, takes 100U TID with meals at home and [facility]. CBGs better controlled with Levemir 50U BID, 12U mealtime insulin, and SSI PRN but still elevated at times. Will restart Mounjaro at discharge. Patient will need close outpatient follow up.   Renal: Cr 0.62. Good UO. K 3.8 supplement. No weight this AM, was about +5lbs from preop yesterday. Was volume overload on admission.   ID: Likely reactive leukocytosis trending down, WBC 13. Tmax 99. Will monitor.    Expected postop ABLA on chronic anemia: H/H 10.2/30.9 not  clinically significant at this time.    DVT Prophylaxis: Lovenox today   Chronic anemia with B12 deficiency and iron deficiency: Continue daily IM B12 and ferrous sulfate 325mg  daily  Deconditioning: PT/OT evals have been ordered   Dispo: Dispo planning     LOS: 8 days    Jenny Reichmann, PA-C 12/03/2022

## 2022-12-03 NOTE — Plan of Care (Signed)
  Problem: Education: Goal: Knowledge of General Education information will improve Description: Including pain rating scale, medication(s)/side effects and non-pharmacologic comfort measures Outcome: Progressing   Problem: Health Behavior/Discharge Planning: Goal: Ability to manage health-related needs will improve Outcome: Progressing   Problem: Clinical Measurements: Goal: Ability to maintain clinical measurements within normal limits will improve Outcome: Progressing Goal: Will remain free from infection Outcome: Progressing Goal: Diagnostic test results will improve Outcome: Progressing Goal: Respiratory complications will improve Outcome: Progressing Goal: Cardiovascular complication will be avoided Outcome: Progressing   Problem: Activity: Goal: Risk for activity intolerance will decrease Outcome: Progressing   Problem: Nutrition: Goal: Adequate nutrition will be maintained Outcome: Progressing   Problem: Coping: Goal: Level of anxiety will decrease Outcome: Progressing   Problem: Elimination: Goal: Will not experience complications related to bowel motility Outcome: Progressing Goal: Will not experience complications related to urinary retention Outcome: Progressing   Problem: Pain Management: Goal: General experience of comfort will improve Outcome: Progressing   Problem: Safety: Goal: Ability to remain free from injury will improve Outcome: Progressing   Problem: Skin Integrity: Goal: Risk for impaired skin integrity will decrease Outcome: Progressing   Problem: Education: Goal: Ability to describe self-care measures that may prevent or decrease complications (Diabetes Survival Skills Education) will improve Outcome: Progressing Goal: Individualized Educational Video(s) Outcome: Progressing   Problem: Coping: Goal: Ability to adjust to condition or change in health will improve Outcome: Progressing   Problem: Fluid Volume: Goal: Ability to  maintain a balanced intake and output will improve Outcome: Progressing   Problem: Health Behavior/Discharge Planning: Goal: Ability to identify and utilize available resources and services will improve Outcome: Progressing Goal: Ability to manage health-related needs will improve Outcome: Progressing   Problem: Metabolic: Goal: Ability to maintain appropriate glucose levels will improve Outcome: Progressing   Problem: Nutritional: Goal: Maintenance of adequate nutrition will improve Outcome: Progressing Goal: Progress toward achieving an optimal weight will improve Outcome: Progressing   Problem: Skin Integrity: Goal: Risk for impaired skin integrity will decrease Outcome: Progressing   Problem: Tissue Perfusion: Goal: Adequacy of tissue perfusion will improve Outcome: Progressing   Problem: Education: Goal: Understanding of CV disease, CV risk reduction, and recovery process will improve Outcome: Progressing Goal: Individualized Educational Video(s) Outcome: Progressing   Problem: Activity: Goal: Ability to return to baseline activity level will improve Outcome: Progressing   Problem: Cardiovascular: Goal: Ability to achieve and maintain adequate cardiovascular perfusion will improve Outcome: Progressing Goal: Vascular access site(s) Level 0-1 will be maintained Outcome: Progressing   Problem: Health Behavior/Discharge Planning: Goal: Ability to safely manage health-related needs after discharge will improve Outcome: Progressing   Problem: Education: Goal: Will demonstrate proper wound care and an understanding of methods to prevent future damage Outcome: Progressing Goal: Knowledge of disease or condition will improve Outcome: Progressing Goal: Knowledge of the prescribed therapeutic regimen will improve Outcome: Progressing Goal: Individualized Educational Video(s) Outcome: Progressing   Problem: Activity: Goal: Risk for activity intolerance will  decrease Outcome: Progressing   Problem: Cardiac: Goal: Will achieve and/or maintain hemodynamic stability Outcome: Progressing   Problem: Clinical Measurements: Goal: Postoperative complications will be avoided or minimized Outcome: Progressing   Problem: Respiratory: Goal: Respiratory status will improve Outcome: Progressing   Problem: Skin Integrity: Goal: Wound healing without signs and symptoms of infection Outcome: Progressing Goal: Risk for impaired skin integrity will decrease Outcome: Progressing   Problem: Urinary Elimination: Goal: Ability to achieve and maintain adequate renal perfusion and functioning will improve Outcome: Progressing

## 2022-12-03 NOTE — Progress Notes (Signed)
      [address]       [place] [address]             [phone]      3 Days Post-Op Procedure(s) (LRB): CORONARY ARTERY BYPASS GRAFTING X 2 , USING LEFT INTERNAL MAMMARY ARTERY AND ENDOSCOPICALLY HARVESTED RIGHT SAPHENOUS VEIN GRAFT (N/A) TRANSESOPHAGEAL ECHOCARDIOGRAM (TEE) (N/A) Subjective: Transferred from ICU to 4E yesterday. Awake and alert, says she is comfortable having chest soreness only with cough. Walked in the hall yesterday.  On RA.Passing gas, no BM in several days.    Objective: Vital signs in last 24 hours: Temp:  [98 F (36.7 C)-99 F (37.2 C)] 98.4 F (36.9 C) (11/29 0346) Pulse Rate:  [83-108] 83 (11/29 0346) Cardiac Rhythm: Normal sinus rhythm (11/28 1900) Resp:  [20-33] 22 (11/29 0422) BP: (96-170)/(58-71) 141/62 (11/29 0346) SpO2:  [95 %-99 %] 95 % (11/29 0346)    Intake/Output from previous day: 11/28 0701 - 11/29 0700 In: 100 [IV Piggyback:100] Out: 900 [Urine:900] Intake/Output this shift: No intake/output data recorded.  General appearance: alert, cooperative, and no distress Neurologic: intact Heart: RRR, no significant arrhythmias Lungs: normal work of breathing, breath sounds clear, O2 sat 95% on RA. Abdomen: soft, no tenderness Extremities: RLE incision is intact and dry, SCD's in place.   Lab Results: Recent Labs    12/02/22 0410 12/03/22 0320  WBC 15.0* 13.0*  HGB 9.5* 10.0*  HCT 30.2* 31.4*  PLT 284 339   BMET:  Recent Labs    12/02/22 0410 12/03/22 0320  NA 134* 138  K 3.9 3.8  CL 105 108  CO2 22 23  GLUCOSE 198* 142*  BUN 11 16  CREATININE 0.67 0.62  CALCIUM 8.3* 8.6*    PT/INR:  Recent Labs    11/30/22 1217  LABPROT 17.0*  INR 1.4*   ABG    Component Value Date/Time   PHART 7.340 (L) 11/30/2022 1555   HCO3 19.7 (L) 11/30/2022 1555   TCO2 21 (L) 11/30/2022 1555   ACIDBASEDEF 5.0 (H) 11/30/2022 1555   O2SAT 95 11/30/2022 1555   CBG (last 3)  Recent Labs    12/02/22 2016  12/03/22 0023 12/03/22 0350  GLUCAP 272* 174* 130*    Assessment/Plan: S/P Procedure(s) (LRB): CORONARY ARTERY BYPASS GRAFTING X 2 , USING LEFT INTERNAL MAMMARY ARTERY AND ENDOSCOPICALLY HARVESTED RIGHT SAPHENOUS VEIN GRAFT (N/A) TRANSESOPHAGEAL ECHOCARDIOGRAM (TEE) (N/A)  -POD3 CABG x 3 after presenting with NSTEMI, EF 50-55%.  On ASA, Plavix, atorvastatin, and metoprolol. Progressing with mobility.  Stable BP and cardiac rhythm.   -PULM- On RA, mild bibasilar ATX on CXR. Encouraging pulmonary hygiene, IS.   -ENDO- type 2 DM, on high-dose insulin prior to admission. Currently on Levemir 50u BID and Novalog 12u TID with reasonable control. Monitoring.   -GI- tolerating PO's no BM yet. Lactulose today.   -HEME- mild expected acute blood loss anemia. Stable.  -Disposition- planning for discharge to home 1-2 days. PT /OT evals to assess for  discharge needs.     LOS: 8 days    Leary Roca, PA-C 12/03/2022

## 2022-12-03 NOTE — Progress Notes (Addendum)
Pt declined walk, pt asked me to come back in an hr.   F/U at 1045  Pt declined walk, asked me to come back later.

## 2022-12-03 NOTE — Progress Notes (Signed)
Pt not given lactulose today. Pt had three large BM's. Pt ambulated in hallway with mobility and PT. Pt ambulated independently to bathroom. OT educated on ADL. Pt resting with call bell within reach.  Will continue to monitor.

## 2022-12-03 NOTE — Evaluation (Signed)
Physical Therapy Evaluation Patient Details Name: Catherine Moon MRN: [id number] DOB: 07/02/1960 Today's Date: 12/03/2022  History of Present Illness  Pt is 62 yo presenting s/p CABGx 2 on 11/30/22. PMH: HTN, Hyperlipidemia, DM II  Clinical Impression  Pt is slightly below prior level of functioning at this time. Currently pt is supervision from recliner and toilet with verbal cues for use of heart pillow to maintain sternal precautions. Pt was supervision for gait with shortness of breathe after short home distance. No significant increase in heart rate. Due to pt current functional status, home set up and available assistance at home no recommended skilled physical therapy services on discharge from acute care setting at this time. Pt tolerated treatment session well.        If plan is discharge home, recommend the following: Help with stairs or ramp for entrance;Assistance with cooking/housework;Assist for transportation     Equipment Recommendations Rolling walker (2 wheels)     Functional Status Assessment Patient has had a recent decline in their functional status and demonstrates the ability to make significant improvements in function in a reasonable and predictable amount of time.     Precautions / Restrictions Precautions Precautions: Sternal Restrictions Weight Bearing Restrictions: Yes RUE Weight Bearing: Non weight bearing LUE Weight Bearing: Non weight bearing      Mobility  Bed Mobility     General bed mobility comments: Pt up in recliner at beginning and end of session.    Transfers Overall transfer level: Needs assistance Equipment used: None Transfers: Sit to/from Stand, Bed to chair/wheelchair/BSC Sit to Stand: Supervision   Step pivot transfers: Supervision       General transfer comment: supervision for safety, verbal cues for use of heart pillow.    Ambulation/Gait Ambulation/Gait assistance: Supervision Gait Distance (Feet): 70 Feet  (+15) Assistive device: Rolling walker (2 wheels) Gait Pattern/deviations: Step-through pattern, Decreased stride length Gait velocity: decreased Gait velocity interpretation: <1.31 ft/sec, indicative of household ambulator   General Gait Details: Slow and steady pace. Pt became short of breathe with gait. HR remained ~115 bpm     Balance Overall balance assessment: No apparent balance deficits (not formally assessed)           Pertinent Vitals/Pain Pain Assessment Pain Assessment: Faces Faces Pain Scale: Hurts little more Facial Expression: Relaxed, neutral Body Movements: Absence of movements Muscle Tension: Relaxed Compliance with ventilator (intubated pts.): N/A Vocalization (extubated pts.): Talking in normal tone or no sound CPOT Total: 0 Pain Location: sternum Pain Descriptors / Indicators: Guarding Pain Intervention(s): Monitored during session    Home Living Family/patient expects to be discharged to:: Private residence Living Arrangements: Spouse/significant other Available Help at Discharge: Family;Available 24 hours/day Type of Home: Other(Comment) (condo) Home Access: Stairs to enter Entrance Stairs-Rails:  (has a wal they can hand onto if needed.) Entrance Stairs-Number of Steps: 1   Home Layout: Two level;Able to live on main level with bedroom/bathroom Home Equipment: None      Prior Function Prior Level of Function : Independent/Modified Independent;Driving;Working/employed             Mobility Comments: Pt reports they were ambulating without AD ADLs Comments: Pt reports independence with ADL"s.     Extremity/Trunk Assessment   Upper Extremity Assessment Upper Extremity Assessment: Overall WFL for tasks assessed (sternal precautions)    Lower Extremity Assessment Lower Extremity Assessment: Overall WFL for tasks assessed    Cervical / Trunk Assessment Cervical / Trunk Assessment: Normal  Communication   Communication  Communication:  No apparent difficulties  Cognition Arousal: Alert Behavior During Therapy: WFL for tasks assessed/performed Overall Cognitive Status: Within Functional Limits for tasks assessed            General Comments General comments (skin integrity, edema, etc.): Pt incision has no drainage noted. Closed and intact.        Assessment/Plan    PT Assessment Patient needs continued PT services  PT Problem List Decreased activity tolerance       PT Treatment Interventions DME instruction;Gait training;Balance training;Stair training;Functional mobility training;Therapeutic activities;Patient/family education    PT Goals (Current goals can be found in the Care Plan section)  Acute Rehab PT Goals Patient Stated Goal: to return home and improve endurance. PT Goal Formulation: With patient Time For Goal Achievement: 12/17/22 Potential to Achieve Goals: Good    Frequency Min 1X/week        AM-PAC PT "6 Clicks" Mobility  Outcome Measure Help needed turning from your back to your side while in a flat bed without using bedrails?: A Little Help needed moving from lying on your back to sitting on the side of a flat bed without using bedrails?: A Little Help needed moving to and from a bed to a chair (including a wheelchair)?: A Little Help needed standing up from a chair using your arms (e.g., wheelchair or bedside chair)?: A Little Help needed to walk in hospital room?: A Little Help needed climbing 3-5 steps with a railing? : A Little 6 Click Score: 18    End of Session Equipment Utilized During Treatment: Gait belt Activity Tolerance: Patient tolerated treatment well Patient left: in chair;with call bell/phone within reach Nurse Communication: Mobility status PT Visit Diagnosis: Other abnormalities of gait and mobility (R26.89)    Time: 0922-0950 PT Time Calculation (min) (ACUTE ONLY): 28 min   Charges:   PT Evaluation $PT Eval Low Complexity: 1 Low PT Treatments $Therapeutic  Activity: 8-22 mins PT General Charges $$ ACUTE PT VISIT: 1 Visit         Harrel Carina, DPT, CLT  Acute Rehabilitation Services Office: [phone] (Secure chat preferred)   Claudia Desanctis 12/03/2022, 9:57 AM

## 2022-12-03 NOTE — Evaluation (Signed)
Occupational Therapy Evaluation Patient Details Name: Catherine Moon MRN: [id number] DOB: Feb 16, 1960 Today's Date: 12/03/2022   History of Present Illness Pt is 62 yo presenting s/p CABGx 2 on 11/30/22. PMH: HTN, Hyperlipidemia, DM II   Clinical Impression   Pt c/o no pain, up and ambulating independent to/from bathroom upon arrival, no AD. Pt lives at home with husband who is taking time off work to assist as needed. Pt PLOF independent. Pt currently close to baseline with ambulation, mobility, needs min A for UB dressing/bathing, mod A for LB dressing/bathing, husband can assist as needed at home. Pt transfers independently, able to ambulate to restroom as needed with no AD, good balance and safety awareness. Pt educated and given handout on precautions, and shown how to use wiping stick for toileting if needed. Pt would benefit from [facility] upon return home, states her toilet seat is too low. Pt has no further acute OT needs, no OT follow up needed.       If plan is discharge home, recommend the following: A little help with bathing/dressing/bathroom;Assist for transportation    Functional Status Assessment  Patient has had a recent decline in their functional status and demonstrates the ability to make significant improvements in function in a reasonable and predictable amount of time.  Equipment Recommendations  BSC/3in1    Recommendations for Other Services       Precautions / Restrictions Precautions Precautions: Sternal Precaution Booklet Issued: Yes (comment) Restrictions Weight Bearing Restrictions: Yes RUE Weight Bearing: Non weight bearing LUE Weight Bearing: Non weight bearing      Mobility Bed Mobility               General bed mobility comments: up in recliner upon entry    Transfers Overall transfer level: Independent                 General transfer comment: no LOB, normal gait, no AD      Balance Overall balance assessment: No  apparent balance deficits (not formally assessed)                                         ADL either performed or assessed with clinical judgement   ADL Overall ADL's : Needs assistance/impaired Eating/Feeding: Independent   Grooming: Independent   Upper Body Bathing: Minimal assistance   Lower Body Bathing: Moderate assistance   Upper Body Dressing : Minimal assistance   Lower Body Dressing: Moderate assistance   Toilet Transfer: Independent   Toileting- Clothing Manipulation and Hygiene: Modified independent   Tub/ Engineer, structural: Independent   Functional mobility during ADLs: Independent General ADL Comments: Pt overall doing well, needs min A for UB dressing/bathing, mod A for LB dressing/bathing, independent overall, good balance, ambulates without AD.     Vision Baseline Vision/History: 1 Wears glasses Ability to See in Adequate Light: 0 Adequate Patient Visual Report: No change from baseline       Perception         Praxis         Pertinent Vitals/Pain Pain Assessment Pain Assessment: No/denies pain     Extremity/Trunk Assessment Upper Extremity Assessment Upper Extremity Assessment: LUE deficits/detail;RUE deficits/detail RUE Deficits / Details: sternal precautions LUE Deficits / Details: sternal precautions   Lower Extremity Assessment Lower Extremity Assessment: Defer to PT evaluation   Cervical / Trunk Assessment Cervical / Trunk Assessment: Normal  Communication Communication Communication: No apparent difficulties   Cognition Arousal: Alert Behavior During Therapy: WFL for tasks assessed/performed Overall Cognitive Status: Within Functional Limits for tasks assessed                                       General Comments  Pt incision has no drainage noted. Closed and intact.    Exercises     Shoulder Instructions      Home Living Family/patient expects to be discharged to:: Private  residence Living Arrangements: Spouse/significant other Available Help at Discharge: Family;Available 24 hours/day Type of Home: Other(Comment) (condo) Home Access: Stairs to enter Entrance Stairs-Number of Steps: 1 Entrance Stairs-Rails:  (has a wal they can hand onto if needed.) Home Layout: Two level;Able to live on main level with bedroom/bathroom     Bathroom Shower/Tub: Tub/shower unit;Walk-in shower   Bathroom Toilet: Handicapped height     Home Equipment: None          Prior Functioning/Environment Prior Level of Function : Independent/Modified Independent;Driving;Working/employed             Mobility Comments: Pt reports they were ambulating without AD ADLs Comments: Pt reports independence with ADL"s.        OT Problem List: Decreased strength;Pain;Impaired UE functional use      OT Treatment/Interventions:      OT Goals(Current goals can be found in the care plan section) Acute Rehab OT Goals Patient Stated Goal: to return home OT Goal Formulation: With patient Time For Goal Achievement: 12/17/22 Potential to Achieve Goals: Good  OT Frequency:      Co-evaluation              AM-PAC OT "6 Clicks" Daily Activity     Outcome Measure Help from another person eating meals?: None Help from another person taking care of personal grooming?: None Help from another person toileting, which includes using toliet, bedpan, or urinal?: None Help from another person bathing (including washing, rinsing, drying)?: A Lot Help from another person to put on and taking off regular upper body clothing?: A Little Help from another person to put on and taking off regular lower body clothing?: A Lot 6 Click Score: 19   End of Session Nurse Communication: Mobility status  Activity Tolerance: Patient tolerated treatment well Patient left: in chair;with call bell/phone within reach  OT Visit Diagnosis: Pain;Muscle weakness (generalized) (M62.81) Pain - part of body:   (chest)                Time: 1140-1210 OT Time Calculation (min): 30 min Charges:  OT General Charges $OT Visit: 1 Visit OT Evaluation $OT Eval Low Complexity: 1 Low OT Treatments $Self Care/Home Management : 8-22 mins  Parker, OTR/L   Alexis Goodell 12/03/2022, 12:10 PM

## 2022-12-03 NOTE — Progress Notes (Signed)
   Patient Name: Catherine Moon Date of Encounter: 12/03/2022 [facility] Cardiologist: None   Interval Summary  .    Doing well.  Walked 70 feet this morning.  No chest pain or shortness of breath.  Had a bowel movement this morning.  Vital Signs .    Vitals:   12/03/22 0346 12/03/22 0422 12/03/22 0700 12/03/22 0748  BP: (!) 141/62   (!) 127/49  Pulse: 83   94  Resp: 20 (!) 22  20  Temp: 98.4 F (36.9 C)   98.3 F (36.8 C)  TempSrc: Oral   Oral  SpO2: 95%   96%  Weight:   105.5 kg   Height:        Intake/Output Summary (Last 24 hours) at 12/03/2022 1101 Last data filed at 12/02/2022 2033 Gross per 24 hour  Intake --  Output 700 ml  Net -700 ml      12/03/2022    7:00 AM 12/02/2022    5:00 AM 12/01/2022    5:00 AM  Last 3 Weights  Weight (lbs) 232 lb 9.4 oz 235 lb 14.3 oz 237 lb 3.4 oz  Weight (kg) 105.5 kg 107 kg 107.6 kg      Telemetry/ECG    Normal sinus rhythm without significant arrhythmia- Personally Reviewed  Physical Exam .   GEN: No acute distress.   Neck: No JVD Cardiac: RRR, no murmurs, rubs, or gallops.  Respiratory: Clear to auscultation bilaterally. GI: Soft, nontender, non-distended  MS: No edema  Assessment & Plan .     1.  Non-STEMI: Postoperative day #3 from two-vessel CABG.  Continue aspirin, clopidogrel, beta-blocker, high intensity statin drug, and Zetia. 2.  Hypertension: Treated with metoprolol and amlodipine added today.  In setting of diabetes, add ACE/ARB at time of outpatient cardiology follow-up.  Overall appears to be making good progress from a cardiac perspective.  Will arrange outpatient follow-up.  Otherwise as above.  Please call if any questions arise.  Thanks  For questions or updates, please contact [facility] Please consult [email or web address] for contact info under        Signed, Tonny Bollman, MD

## 2022-12-04 LAB — GLUCOSE, CAPILLARY
Glucose-Capillary: 128 mg/dL — ABNORMAL HIGH (ref 70–99)
Glucose-Capillary: 114 mg/dL — ABNORMAL HIGH (ref 70–99)

## 2022-12-04 MED ORDER — POTASSIUM CHLORIDE CRYS ER 20 MEQ PO TBCR: 20.0000 meq | EXTENDED_RELEASE_TABLET | Freq: Every day | ORAL | 0 refills | Status: DC

## 2022-12-04 MED ORDER — POTASSIUM 99 MG PO TABS: 1.0000 | ORAL_TABLET | Freq: Every day | ORAL | Status: AC

## 2022-12-04 MED ORDER — AMLODIPINE BESYLATE 5 MG PO TABS: 5.0000 mg | ORAL_TABLET | Freq: Every day | ORAL | 1 refills | Status: DC

## 2022-12-04 MED ORDER — OXYCODONE HCL 5 MG PO TABS: 5.0000 mg | ORAL_TABLET | ORAL | 0 refills | Status: DC | PRN

## 2022-12-04 MED ORDER — METOPROLOL TARTRATE 50 MG PO TABS: 50.0000 mg | ORAL_TABLET | Freq: Two times a day (BID) | ORAL | 1 refills | Status: DC

## 2022-12-04 MED ORDER — CLOPIDOGREL BISULFATE 75 MG PO TABS: 75.0000 mg | ORAL_TABLET | Freq: Every day | ORAL | 1 refills | Status: DC

## 2022-12-04 MED ORDER — FUROSEMIDE 40 MG PO TABS: 40.0000 mg | ORAL_TABLET | Freq: Every day | ORAL | 0 refills | Status: DC

## 2022-12-04 NOTE — Plan of Care (Signed)
  Problem: Education: Goal: Knowledge of General Education information will improve Description: Including pain rating scale, medication(s)/side effects and non-pharmacologic comfort measures Outcome: Adequate for Discharge   Problem: Health Behavior/Discharge Planning: Goal: Ability to manage health-related needs will improve Outcome: Adequate for Discharge   Problem: Clinical Measurements: Goal: Ability to maintain clinical measurements within normal limits will improve Outcome: Adequate for Discharge Goal: Will remain free from infection Outcome: Adequate for Discharge Goal: Diagnostic test results will improve Outcome: Adequate for Discharge Goal: Respiratory complications will improve Outcome: Adequate for Discharge Goal: Cardiovascular complication will be avoided Outcome: Adequate for Discharge   Problem: Activity: Goal: Risk for activity intolerance will decrease Outcome: Adequate for Discharge   Problem: Coping: Goal: Level of anxiety will decrease Outcome: Adequate for Discharge   Problem: Nutrition: Goal: Adequate nutrition will be maintained Outcome: Adequate for Discharge   Problem: Elimination: Goal: Will not experience complications related to bowel motility Outcome: Adequate for Discharge Goal: Will not experience complications related to urinary retention Outcome: Adequate for Discharge   Problem: Safety: Goal: Ability to remain free from injury will improve Outcome: Adequate for Discharge   Problem: Skin Integrity: Goal: Risk for impaired skin integrity will decrease Outcome: Adequate for Discharge   Problem: Education: Goal: Ability to describe self-care measures that may prevent or decrease complications (Diabetes Survival Skills Education) will improve Outcome: Adequate for Discharge Goal: Individualized Educational Video(s) Outcome: Adequate for Discharge

## 2022-12-04 NOTE — TOC Transition Note (Signed)
Transition of Care [facility]) - CM/SW Discharge Note   Patient Details  Name: Catherine Moon MRN: [id number] Date of Birth: Nov 06, 1960  [facility]) CM/SW Contact:  Ronny Bacon, RN Phone Number: 12/04/2022, 9:31 AM   Clinical Narrative:   Patient is being discharged today. Order for RW and 3:1 noted and arranged through Kim with [employer] to be delivered to bedside before discharging today.    Final next level of care: Home/Self Care Barriers to Discharge: No Barriers Identified   Patient Goals and CMS Choice      Discharge Placement                         Discharge Plan and Services Additional resources added to the After Visit Summary for                  DME Arranged: Walker rolling, 3-N-1 DME Agency: [employer] Date DME Agency Contacted: 12/04/22 Time DME Agency Contacted: [phone] Representative spoke with at DME Agency: Selena Batten            Social Determinants of Health (SDOH) Interventions SDOH Screenings   Food Insecurity: No Food Insecurity (11/26/2022)  Housing: Low Risk  (11/26/2022)  Transportation Needs: No Transportation Needs (11/26/2022)  Utilities: Not At Risk (11/26/2022)  Alcohol Screen: Low Risk  (11/25/2022)  Depression (PHQ2-9): Medium Risk (09/02/2021)  Financial Resource Strain: Low Risk  (11/25/2022)  Physical Activity: Unknown (11/25/2022)  Social Connections: Socially Isolated (11/25/2022)  Stress: No Stress Concern Present (11/25/2022)  Tobacco Use: Medium Risk (11/30/2022)     Readmission Risk Interventions     No data to display

## 2022-12-04 NOTE — Progress Notes (Signed)
  CARDIAC REHAB PHASE I    PRE:                Rate/Rhythm: NSR 92   BP:                  Sitting:  122/54                                      SaO2: 94% RA    MODE:            Ambulation: 470 ft           POST:             Rate/Rhythm:    BP:                  Sitting:                                SaO2:    Pt tolerated exercise fair and amb 470 ft with walker independently. Pt denies CP, SOB, or dizziness throughout walk. Pt was educated on restrictions post CABG, weight restrictions, bathing, s/s of infections risk factors for MI, nutrition, exercise guidelines and recommendations, s/s to stop exercising, and orientation to Cardiac Rehab Phase 2. Pt received materials on exercise, diet, and CRPII. Pt understands without assistance. Will refer to [facility]. Pt being discharged to home today.      Service time is from [phone] to 1013  Essie Hart, RN, BSN Cardiac and Pulmonary Rehab

## 2022-12-04 NOTE — Progress Notes (Addendum)
[address]       [place] [address]             [phone]      4 Days Post-Op Procedure(s) (LRB): CORONARY ARTERY BYPASS GRAFTING X 2 , USING LEFT INTERNAL MAMMARY ARTERY AND ENDOSCOPICALLY HARVESTED RIGHT SAPHENOUS VEIN GRAFT (N/A) TRANSESOPHAGEAL ECHOCARDIOGRAM (TEE) (N/A) Subjective: Patient sleeping when entering the room, states she is tired but no other complaints. Multiple BM yesterday.  Objective: Vital signs in last 24 hours: Temp:  [97.7 F (36.5 C)-98.6 F (37 C)] 98.1 F (36.7 C) (11/30 0352) Pulse Rate:  [82-96] 82 (11/30 0352) Cardiac Rhythm: Normal sinus rhythm (11/29 1906) Resp:  [15-24] 24 (11/30 0530) BP: (117-157)/(49-71) 137/65 (11/30 0352) SpO2:  [95 %-98 %] 97 % (11/30 0352) Weight:  [105.2 kg] 105.2 kg (11/30 0530)  Hemodynamic parameters for last 24 hours:    Intake/Output from previous day: 11/29 0701 - 11/30 0700 In: 150 [P.O.:150] Out: -  Intake/Output this shift: No intake/output data recorded.  General appearance: alert, cooperative, and no distress Neurologic: intact Heart: regular rate and rhythm, S1, S2 normal, no murmur, click, rub or gallop Lungs: slightly diminished bibasilar breath sounds Abdomen: soft, non-tender; bowel sounds normal; no masses,  no organomegaly Extremities: edema 1+ Wound: Sternal and EVH site are clean and dry without sign of infection  Lab Results: Recent Labs    12/02/22 0410 12/03/22 0320  WBC 15.0* 13.0*  HGB 9.5* 10.0*  HCT 30.2* 31.4*  PLT 284 339   BMET:  Recent Labs    12/02/22 0410 12/03/22 0320  NA 134* 138  K 3.9 3.8  CL 105 108  CO2 22 23  GLUCOSE 198* 142*  BUN 11 16  CREATININE 0.67 0.62  CALCIUM 8.3* 8.6*    PT/INR: No results for input(s): "LABPROT", "INR" in the last 72 hours. ABG    Component Value Date/Time   PHART 7.340 (L) 11/30/2022 1555   HCO3 19.7 (L) 11/30/2022 1555   TCO2 21 (L) 11/30/2022 1555   ACIDBASEDEF 5.0 (H) 11/30/2022 1555    O2SAT 95 11/30/2022 1555   CBG (last 3)  Recent Labs    12/03/22 1132 12/03/22 1638 12/04/22 0650  GLUCAP 299* 284* 128*    Assessment/Plan: S/P Procedure(s) (LRB): CORONARY ARTERY BYPASS GRAFTING X 2 , USING LEFT INTERNAL MAMMARY ARTERY AND ENDOSCOPICALLY HARVESTED RIGHT SAPHENOUS VEIN GRAFT (N/A) TRANSESOPHAGEAL ECHOCARDIOGRAM (TEE) (N/A)  Neuro: Pain is controlled this AM   CV: Hx of NSTEMI, on Plavix. BP overall controlled, SBP 137 this AM. On Lopressor 50mg  BID and Norvasc 5mg . Per cardiology will start ACE/ARB as an outpatient. NSR, HR 80s.    Pulm: Saturating well on RA today. Last CXR with mild interstitial thickening, bibasilar atelectasis and L>R small pleural effusions. Encourage IS and ambulation. Continue diuresis.    GI: Tolerating a diet. 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 Very insulin resistant, takes 100U TID with meals at home and [employer]. CBGs better controlled with Levemir 50U BID, 15U mealtime insulin, and SSI PRN but still elevated at times. Will restart Mounjaro at discharge. Patient followed by endocrinology, discussed importance of diet and keeping sugars under control.    Renal: Last Cr 0.62. No weight this AM, about +3lbs from preop. Was volume overload on admission. Continue Lasix and K supplement.    ID: Likely reactive leukocytosis trending down, WBC 13. Also looks like patient has chronic leukocytosis. Tmax 98.6. Pt to follow  up with her PCP.   DVT Prophylaxis: Lovenox   Chronic anemia with B12 deficiency and iron deficiency: Continue daily IM B12 and ferrous sulfate 325mg  daily  Moderate carotid stenosis: stable   Deconditioning: Increased ambulation yesterday. Continue work with PT/OT. Home RW and BSC ordered.   Dispo: Possibly discharge to home today    LOS: 9 days    Jenny Reichmann, PA-C 12/04/2022

## 2022-12-06 ENCOUNTER — Telehealth: Payer: Self-pay | Admitting: *Deleted

## 2022-12-06 LAB — GLUCOSE, CAPILLARY: Glucose-Capillary: 244 mg/dL — ABNORMAL HIGH (ref 70–99)

## 2022-12-06 NOTE — Transitions of Care (Post Inpatient/ED Visit) (Signed)
   12/06/2022  Name: Catherine Moon MRN: [id number] DOB: 07-Apr-1960  Today's TOC FU Call Status: Today's TOC FU Call Status:: Unsuccessful Call (2nd Attempt) Unsuccessful Call (2nd Attempt) Date: 12/06/22  Attempted to reach the patient regarding the most recent Inpatient/ED visit.  Follow Up Plan: Additional outreach attempts will be made to reach the patient to complete the Transitions of Care (Post Inpatient/ED visit) call.   Gean Maidens BSN RN Population Health- Transition of Care Team.  [facility] [phone]

## 2022-12-06 NOTE — Transitions of Care (Post Inpatient/ED Visit) (Signed)
   12/06/2022  Name: Catherine Moon MRN: [id number] DOB: 1960-03-08  Today's TOC FU Call Status: Today's TOC FU Call Status:: Unsuccessful Call (1st Attempt) Unsuccessful Call (1st Attempt) Date: 12/06/22  Attempted to reach the patient regarding the most recent Inpatient/ED visit.  Follow Up Plan: Additional outreach attempts will be made to reach the patient to complete the Transitions of Care (Post Inpatient/ED visit) call.   Gean Maidens BSN RN Population Health- Transition of Care Team.  [facility] [phone]

## 2022-12-07 ENCOUNTER — Telehealth (HOSPITAL_COMMUNITY): Payer: Self-pay

## 2022-12-07 ENCOUNTER — Telehealth: Payer: Self-pay | Admitting: *Deleted

## 2022-12-07 ENCOUNTER — Telehealth: Payer: Self-pay

## 2022-12-07 MED FILL — Heparin Sodium (Porcine) Inj 1000 Unit/ML: Qty: 1000 | Status: AC

## 2022-12-07 MED FILL — Lidocaine HCl Local Preservative Free (PF) Inj 2%: INTRAMUSCULAR | Qty: 14 | Status: AC

## 2022-12-07 MED FILL — Potassium Chloride Inj 2 mEq/ML: INTRAVENOUS | Qty: 40 | Status: AC

## 2022-12-07 NOTE — Telephone Encounter (Signed)
Attempted to call patient in regards to Cardiac Rehab - LM on VM 

## 2022-12-07 NOTE — Transitions of Care (Post Inpatient/ED Visit) (Addendum)
12/07/2022  Name: Catherine Moon MRN: [id number] DOB: 11/23/1960  Today's TOC FU Call Status: Today's TOC FU Call Status:: Successful TOC FU Call Completed TOC FU Call Complete Date: 12/07/22 Patient's Name and Date of Birth confirmed.  Transition Care Management Follow-up Telephone Call Date of Discharge: 12/04/22 Discharge Facility: [facility]) Type of Discharge: Inpatient Admission Primary Inpatient Discharge Diagnosis:: precordil pain How have you been since you were released from the hospital?: Better Any questions or concerns?: No  Items Reviewed: Did you receive and understand the discharge instructions provided?: Yes Medications obtained,verified, and reconciled?: Yes (Medications Reviewed) Any new allergies since your discharge?: No Dietary orders reviewed?: No Do you have support at home?: Yes People in Home: spouse Name of Support/Comfort Primary Source: Sherilyn Cooter  Medications Reviewed Today: Medications Reviewed Today     Reviewed by Luella Cook, RN (Case Manager) on 12/07/22 at 1432  Med List Status: <None>   Medication Order Taking? Sig Documenting Provider Last Dose Status Informant  acetaminophen (TYLENOL) 500 MG tablet [id number] Yes Take 1,000 mg by mouth every 6 (six) hours as needed for mild pain, moderate pain, fever or headache. [provider] Taking Active Self  amLODipine (NORVASC) 5 MG tablet [id number] Yes Take 1 tablet (5 mg total) by mouth daily. Jenny Reichmann, PA-C Taking Active   aspirin 81 MG EC tablet [id number] Yes Take 81 mg by mouth daily. [provider] Taking Active Self  atorvastatin (LIPITOR) 80 MG tablet [id number] Yes Take 1 tablet (80 mg total) by mouth daily. Due for physical. [place], Betty G, MD Taking Active Self  calcium carbonate (OSCAL) 1500 (600 Ca) MG TABS tablet [id number] Yes Take 1,200 mg of elemental calcium by mouth daily with breakfast. [provider] Taking Active Self   clopidogrel (PLAVIX) 75 MG tablet [id number] Yes Take 1 tablet (75 mg total) by mouth daily. Jenny Reichmann, PA-C Taking Active   Continuous Glucose Sensor (FREESTYLE LIBRE 3 SENSOR) [place] [id number] Yes Place 1 sensor on the skin every 14 days. Use to check glucose continuously Carlus Pavlov, MD Taking Active Self  ezetimibe (ZETIA) 10 MG tablet [id number] Yes Take 1 tablet (10 mg total) by mouth daily. Carlus Pavlov, MD Taking Active Self  ferrous sulfate 325 (65 FE) MG tablet [id number] Yes Take 325 mg by mouth daily with breakfast. [provider] Taking Active Self  Fexofenadine HCl (ALLEGRA PO) [id number] Yes Take 120 mg by mouth daily. [provider] Taking Active Self  furosemide (LASIX) 40 MG tablet [id number] Yes Take 1 tablet (40 mg total) by mouth daily. Take for 5 days then stop Jenny Reichmann, PA-C Taking Active   Insulin Pen Needle 32G X 4 MM MISC [id number] Yes Use 4x a day Carlus Pavlov, MD Taking Active Self  insulin regular human CONCENTRATED (HUMULIN R U-500 KWIKPEN) 500 UNIT/ML KwikPen [id number] Yes Inject 80-100 Units into the skin 3 (three) times daily before meals.  Patient taking differently: Inject 100 Units into the skin 3 (three) times daily before meals.   Carlus Pavlov, MD Taking Active Self  Loratadine (CLARITIN PO) [id number] Yes Take 10 mg by mouth daily. [provider] Taking Active Self  magnesium oxide (MAG-OX) 400 MG tablet [id number] Yes Take 400 mg by mouth daily. [provider] Taking Active Self  megestrol (MEGACE) 40 MG tablet [id number] Yes Take 40 mg by mouth daily. TAKE ONE TABLET BY MOUTH TWO TIMES A DAY FOR 3 DAYS AND THEN 1 TABLET DAILY FOR 15  DAYS [provider] Taking Active Self           Med Note (WHITE, Karlene Lineman Nov 25, 2022  9:55 PM) Today is the first day of 1 tablet daily  metoprolol tartrate (LOPRESSOR) 50 MG tablet [id number] Yes Take 1 tablet (50 mg total) by mouth 2 (two) times  daily. Jenny Reichmann, PA-C Taking Active   Multiple Vitamin (MULTIVITAMIN WITH MINERALS) TABS tablet [id number] Yes Take 1 tablet by mouth daily. [provider] Taking Active Self  oxyCODONE (OXY IR/ROXICODONE) 5 MG immediate release tablet [id number] Yes Take 1 tablet (5 mg total) by mouth every 3 (three) hours as needed for severe pain (pain score 7-10). Jenny Reichmann, PA-C Taking Active   Potassium 99 MG TABS [id number] Yes Take 1 tablet (99 mg total) by mouth daily. Jenny Reichmann, PA-C Taking Active   potassium chloride SA (KLOR-CON M) 20 MEQ tablet [id number] Yes Take 1 tablet (20 mEq total) by mouth daily. Take for 5 days then stop Stehler, Oren Bracket, PA-C Taking Active   tirzepatide [facility]) 10 MG/0.5ML Pen [id number] Yes Inject 10 mg into the skin once a week.  Patient taking differently: Inject 10 mg into the skin once a week. Thursday   Carlus Pavlov, MD Taking Active Self  zinc gluconate 50 MG tablet [id number] Yes Take 50 mg by mouth daily. [provider] Taking Active Self            Home Care and Equipment/Supplies: Were Home Health Services Ordered?: No EMR reviewed for Home Health Orders: Home Health Not Ordered Any new equipment or medical supplies ordered?: NA  Functional Questionnaire: Do you need assistance with bathing/showering or dressing?: Yes Do you need assistance with meal preparation?: Yes Do you need assistance with eating?: No Do you have difficulty maintaining continence: No Do you need assistance with getting out of bed/getting out of a chair/moving?: No Do you have difficulty managing or taking your medications?: No  Follow up appointments reviewed: PCP Follow-up appointment confirmed?: No MD Provider Line Number:[phone] Given: Yes (Patient will make f/u appt) Specialist Hospital Follow-up appointment confirmed?: Yes Date of Specialist follow-up appointment?: 12/20/22 Follow-Up Specialty Provider:: [id number] Dr  Cliffton Asters virtual appt/ [id number] Samara Deist Lawrence/ [id number] GI CT Do you need transportation to your follow-up appointment?: No Do you understand care options if your condition(s) worsen?: Yes-patient verbalized understanding  SDOH Interventions Today    Flowsheet Row Most Recent Value  SDOH Interventions   Food Insecurity Interventions Intervention Not Indicated  Housing Interventions Intervention Not Indicated  Transportation Interventions Intervention Not Indicated, Patient Resources (Friends/Family)  Utilities Interventions Intervention Not Indicated       Goals Addressed             This Visit's Progress    30 day TOC Program       Current Barriers:  Knowledge Deficits related to plan of care for management of CAD  Patient had CABG x 2   RNCM Clinical Goal(s):  Patient will work with the Care Management team over the next 30 days to address Transition of Care Barriers: Provider appointments take all medications exactly as prescribed and will call provider for medication related questions as evidenced by EMR attend all scheduled medical appointments: PCP and Specialists as evidenced by EMR  through collaboration with RN Care manager, provider, and care team.   Interventions: Evaluation of current treatment plan related to  self management and patient's adherence to plan as established by provider  Attend Cardiac Rehabilitation as per ordered  CAD Interventions: (Status:  New goal.) Long Term Goal Assessed understanding of CAD diagnosis Medications reviewed including medications utilized in CAD treatment plan Provided education on importance of blood pressure control in management of CAD Reviewed Importance of taking all medications as prescribed Reviewed Importance of attending all scheduled provider appointments  Patient Goals/Self-Care Activities: Participate in Transition of Care Program/Attend [facility] scheduled calls Notify RN Care Manager of TOC call rescheduling  needs Take all medications as prescribed Attend all scheduled provider appointments Call pharmacy for medication refills 3-7 days in advance of running out of medications  Follow Up Plan:  Telephone follow up appointment with care management team member scheduled for:  Antionette Fairy   [id number] 11:00       Patient consent to follow up outreach calls with [facility] Coordinator TOC interventions discussed/reviewed: -Discussed/reviewed insurance/health plans benefits -Doctor visit discussed/reviewed -PCP -Doctor visits discussed/reviewed-Specialist -Provided Verbal Education: 30-day TOC program, nutrition, meds & their functions, symptom mgmt., fall/safety measures in the home  Patient states she wakes up sweating but never takes her blood sugar because she is upstairs. Pt has a free style libre. Rn instructed to check her BS and document and take to her PCP appt.  RN talked about a night time snacks of graham crackers and peanut butter and skim milk., or half of [place] sandwich and skim milk.  Gean Maidens BSN RN Population Health- Transition of Care Team.  [facility] [phone]

## 2022-12-07 NOTE — Transitions of Care (Post Inpatient/ED Visit) (Signed)
   12/07/2022  Name: Maritere Brunjes MRN: [id number] DOB: [birth year]  Today's TOC FU Call Status: Today's TOC FU Call Status:: Unsuccessful Call (3rd Attempt) Unsuccessful Call (3rd Attempt) Date: 12/07/22  Attempted to reach the patient regarding the most recent Inpatient/ED visit.  Follow Up Plan: No further outreach attempts will be made at this time. We have been unable to contact the patient.    Antionette Fairy, RN,BSN,CCM RN Care Manager Transitions of Care  [place]/Population Health  Direct Phone: [phone] Toll Free: [phone] Fax: [phone]

## 2022-12-08 MED FILL — Mannitol IV Soln 20%: INTRAVENOUS | Qty: 500 | Status: AC

## 2022-12-08 MED FILL — Sodium Bicarbonate IV Soln 8.4%: INTRAVENOUS | Qty: 50 | Status: AC

## 2022-12-08 MED FILL — Heparin Sodium (Porcine) Inj 1000 Unit/ML: INTRAMUSCULAR | Qty: 10 | Status: AC

## 2022-12-08 MED FILL — Calcium Chloride Inj 10%: INTRAVENOUS | Qty: 10 | Status: AC

## 2022-12-08 MED FILL — Sodium Chloride IV Soln 0.9%: INTRAVENOUS | Qty: 2000 | Status: AC

## 2022-12-08 MED FILL — Electrolyte-R (PH 7.4) Solution: INTRAVENOUS | Qty: 3000 | Status: AC

## 2022-12-14 NOTE — Telephone Encounter (Signed)
Jodelle Gross, NP  You3 minutes ago (9:51 AM)   Megace is generally not prescribed after bypass surgery as it can increase the incidence of blot clots. Please check with the CVTS surgeon on this as he would be the first line of decision makers on this medication.  K

## 2022-12-15 ENCOUNTER — Other Ambulatory Visit: Payer: Self-pay

## 2022-12-15 NOTE — Patient Outreach (Signed)
Care Management  Transitions of Care Program Transitions of Care Post-discharge week 2   12/15/2022 Name: Catherine Moon MRN: [id number] DOB: 1960-05-27  Subjective: Catherine Moon is a 62 y.o. year old female who is a primary care patient of [place], Timoteo Expose, MD. The Care Management team Engaged with patient Engaged with patient by telephone to assess and address transitions of care needs.   Consent to Services:  Patient was given information about care management services, agreed to services, and gave verbal consent to participate.   Assessment:   Patient voices she is doing well-has had a good week. No new issues or concerns. Appetite good- states her and spouse are following new diet restrictions-using Mrs. Dash seasoning and getting rid of salty/unhealthy foods in the home. Pt reports she started back taking Megace this week. Reviewed and discussed with pt per provider mychart messages from cardiology regarding risk/concern of blood clot with med following surgery. Pt has sent message to surgeon to inquire about med. She has follow up appt with surgeon on Friday. She goes to see cardiology on 12/20/22. Denies ay RN CM needs or concerns at this time.         SDOH Interventions    Flowsheet Row Telephone from 12/07/2022 in  POPULATION HEALTH DEPARTMENT  SDOH Interventions   Food Insecurity Interventions Intervention Not Indicated  Housing Interventions Intervention Not Indicated  Transportation Interventions Intervention Not Indicated, Patient Resources (Friends/Family)  Utilities Interventions Intervention Not Indicated        Goals Addressed             This Visit's Progress    TOC Care Plan       Current Barriers:  Knowledge Deficits related to plan of care for management of CAD  Patient had CABG x 2   RNCM Clinical Goal(s):  Patient will work with the Care Management team over the next 30 days to address Transition of Care Barriers: mgmt  of chronic conditions take all medications exactly as prescribed and will call provider for medication related questions as evidenced by EMR attend all scheduled medical appointments: PCP and Specialists as evidenced by EMR  through collaboration with RN Care manager, provider, and care team.   Interventions: Evaluation of current treatment plan related to  self management and patient's adherence to plan as established by provider Attend Cardiac Rehabilitation as per ordered  CAD Interventions: (Status:  Goal on track:  Yes.) Short Term Goal Assessed understanding of CAD diagnosis Medications reviewed including medications utilized in CAD treatment plan Provided education on Importance of limiting foods high in cholesterol Counseled on importance of regular laboratory monitoring as prescribed Counseled on the importance of exercise goals with target of 150 minutes per week Reviewed Importance of taking all medications as prescribed Reviewed Importance of attending all scheduled provider appointments Advised patient to discuss continuing to take Megace with with provider Assessed for surgical site healing progress-pt reports incision looks good-no s/s of infection-sore at times-only having to take Tylenol prn Educated on low salt/heart healthy diet- limiting salt intake, reading food labels,etc Discussed wgt monitoring in the home-pt has scale-weighing daily-wgt 225lbs today-denies any swelling or SOB  Patient Goals/Self-Care Activities: Participate in Transition of Care Program/Attend TOC scheduled calls Notify RN Care Manager of TOC call rescheduling needs Take all medications as prescribed Attend all scheduled provider appointments Call pharmacy for medication refills 3-7 days in advance of running out of medications Weigh daily and report any abnormal wgt changes  Follow Up  Plan:  Telephone follow up appointment with care management team member scheduled for:  12/22/22-11am The  patient has been provided with contact information for the care management team and has been advised to call with any health related questions or concerns.         Plan: The patient has been provided with contact information for the care management team and has been advised to call with any health related questions or concerns.  Follow up appt scheduled with pt for 12/22/22-11 am.   Antionette Fairy, RN,BSN,CCM RN Care Manager Transitions of Care  [place]/Population Health  Direct Phone: [phone] Toll Free: [phone] Fax: [phone]

## 2022-12-15 NOTE — Progress Notes (Signed)
Cardiology Office Note:  .   Date:  12/20/2022  ID:  Catherine Moon, DOB [birth year], MRN [id number] PCP: [place], Betty G, MD  [facility] Providers Cardiologist:  Dr.McAlhany      }   History of Present Illness: .   Catherine Moon is a 62 y.o. female we are seeing on post hospital follow up after admission for NSTEMI and subsequent 2 vessel CABG.  She underwent cardiac cath which revealed a 70% left main stenosis in a left dominant circulation. EF 50-55% She underwent CABG x 2 utilizing LIMA to LAD and SVG to OM as well as endoscopic harvest of the right greater saphenous vein on 11/26 /2024.  Other history includes hypercholesterolemia, hypertension, type 2 diabetes, and hypertension.  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  She comes today sore but feeling better each day.  She no longer has the type of chest pain she was experiencing prior to having CABG.  She said it is remarkable that she the pain is subsided after the surgery.  She continues to have some mild shortness of breath with exertion and she is walking 5 minutes 3 times a day to begin to feel more energetic.  She has not yet seen Dr. Cliffton Asters on follow-up yet.  She has been medically compliant and denies any issues on Plavix.   She is having some postoperative insomnia, mild memory issues, and has some postsurgical soreness but not frank pain.  ROS:   Studies Reviewed: Marland Kitchen    LEFT HEART CATH AND CORONARY ANGIOGRAPHY 11/26/2022      Prox RCA lesion is 60% stenosed.   Prox RCA to Mid RCA lesion is 90% stenosed.   Prox Cx to Mid Cx lesion is 30% stenosed.   Prox LAD to Mid LAD lesion is 30% stenosed.   Mid LAD lesion is 20% stenosed.   Ost LM lesion is 40% stenosed.   Mid LM to Dist LM lesion is 70% stenosed.   LPAV lesion is 60% stenosed.   LV end diastolic pressure is severely elevated.   The left ventricular ejection fraction is 50-55% by  visual estimate.  EKG Interpretation Date/Time:  Monday December 20 2022 10:08:15 EST Ventricular Rate:  90 PR Interval:  152 QRS Duration:  92 QT Interval:  388 QTC Calculation: 474 R Axis:   21  Text Interpretation: Normal sinus rhythm Cannot rule out Anterior infarct , age undetermined When compared with ECG of 01-Dec-2022 14:10, ST no longer elevated in Lateral leads T wave amplitude has decreased in Lateral leads Confirmed by Joni Reining [phone]) on 12/20/2022 10:54:14 AM     Physical Exam:   VS:  BP (!) 124/58 (BP Location: Left Arm, Patient Position: Sitting, Cuff Size: Large)   Pulse 90   Ht 5\' 6"  (1.676 m)   Wt 228 lb 3.2 oz (103.5 kg)   SpO2 98%   BMI 36.83 kg/m    Wt Readings from Last 3 Encounters:  12/20/22 228 lb 3.2 oz (103.5 kg)  12/04/22 232 lb (105.2 kg)  09/14/22 252 lb (114.3 kg)    GEN: Well nourished, well developed in no acute distress NECK: No JVD; No carotid bruits CARDIAC: RRR, no murmurs, rubs, gallops MUS: Well-healed sternotomy scar incision, without evidence of evisceration or infection.  Right SVG harvest site is very well-healed without evidence of infection. RESPIRATORY:  Clear to auscultation without rales, wheezing or rhonchi  ABDOMEN: Soft, non-tender, non-distended  EXTREMITIES:  No edema; No deformity   ASSESSMENT AND PLAN: .    Severe coronary artery disease:  70% left main stenosis in a left dominant circulation, and 90% RCA.  Status post CABG x 2.  She is doing well postoperatively without any significant cardiac issues.  She is slowly getting her energy back and has not yet been released to go to cardiac rehab as she has not seen Dr. Cliffton Asters on follow-up at this time.  She continues to feel better each day and is doing her best to get up and walk 5 minutes 3 times a day, she is changing her eating habits, and medically compliant.  Will see her in 1 month for close follow-up.  Hopefully by the time we see her again she will have been  released to move forward with cardiac rehab.  Deferring to Dr. Cliffton Asters to sign off.  I will check in with cardiologist concerning need to continue Plavix on next follow-up visit.  2.  Hypertension: Remains on amlodipine and metoprolol.  Blood pressure is well-controlled today.  Will not make any changes in her current medication regimen at this time.  3.  Insulin-dependent diabetes: Followed by primary care.  She is changing her eating habits and is being more mindful of her diet.  She is encouraged in this endeavor.  4.  Hypercholesterolemia: Goal of LDL less than 70 status post CABG with diabetes.  Currently she is on atorvastatin 80 mg daily.  She will need to have fasting lipids and LFTs checked in 3 months for ongoing surveillance.  5.  Uterine bleeding: Ongoing chronic issue now on Megace.  Would not rec amend being on Megace status post CABG.  I have advised her to follow with her OB/GYN to discuss other options.  Of course Dr. Cliffton Asters should weigh in concerning his opinion on continue this medication postoperatively.     Cardiac Rehabilitation Eligibility Assessment  The patient is ready to start cardiac rehabilitation pending clearance from the cardiac surgeon.         Signed, Bettey Mare. Liborio Nixon, ANP, AACC

## 2022-12-15 NOTE — Patient Instructions (Signed)
Visit Information  Thank you for taking time to visit with me today. Please don't hesitate to contact me if I can be of assistance to you before our next scheduled telephone appointment.  Our next appointment is by telephone on 12/22/22 at 11 am.  Following is a copy of your care plan:   Goals Addressed             This Visit's Progress    TOC Care Plan       Current Barriers:  Knowledge Deficits related to plan of care for management of CAD  Patient had CABG x 2   RNCM Clinical Goal(s):  Patient will work with the Care Management team over the next 30 days to address Transition of Care Barriers: mgmt of chronic conditions take all medications exactly as prescribed and will call provider for medication related questions as evidenced by EMR attend all scheduled medical appointments: PCP and Specialists as evidenced by EMR  through collaboration with RN Care manager, provider, and care team.   Interventions: Evaluation of current treatment plan related to  self management and patient's adherence to plan as established by provider Attend Cardiac Rehabilitation as per ordered  CAD Interventions: (Status:  Goal on track:  Yes.) Short Term Goal Assessed understanding of CAD diagnosis Medications reviewed including medications utilized in CAD treatment plan Provided education on Importance of limiting foods high in cholesterol Counseled on importance of regular laboratory monitoring as prescribed Counseled on the importance of exercise goals with target of 150 minutes per week Reviewed Importance of taking all medications as prescribed Reviewed Importance of attending all scheduled provider appointments Advised patient to discuss continuing to take Megace with with provider Assessed for surgical site healing progress-pt reports incision looks good-no s/s of infection-sore at times-only having to take Tylenol prn Educated on low salt/heart healthy diet- limiting salt intake, reading food  labels,etc Discussed wgt monitoring in the home-pt has scale-weighing daily-wgt 225lbs today-denies any swelling or SOB  Patient Goals/Self-Care Activities: Participate in Transition of Care Program/Attend TOC scheduled calls Notify RN Care Manager of TOC call rescheduling needs Take all medications as prescribed Attend all scheduled provider appointments Call pharmacy for medication refills 3-7 days in advance of running out of medications Weigh daily and report any abnormal wgt changes  Follow Up Plan:  Telephone follow up appointment with care management team member scheduled for:  12/22/22-11am The patient has been provided with contact information for the care management team and has been advised to call with any health related questions or concerns.         Patient verbalizes understanding of instructions and care plan provided today and agrees to view in MyChart. Active MyChart status and patient understanding of how to access instructions and care plan via MyChart confirmed with patient.     The patient has been provided with contact information for the care management team and has been advised to call with any health related questions or concerns.   Please call the care guide team at [phone] if you need to cancel or reschedule your appointment.   Please call the [place] National Suicide Prevention Lifeline: (581) 038-2926 or TTY: (847)786-5601 TTY (680)394-0488) to talk to a trained counselor call 1-800-273-TALK (toll free, 24 hour hotline) if you are experiencing a Mental Health or Behavioral Health Crisis or need someone to talk to.  Antionette Fairy, RN,BSN,CCM RN Care Manager Transitions of Care  [place]/Population Health  Direct Phone: [phone] Toll Free: [phone] Fax: [phone]

## 2022-12-17 ENCOUNTER — Telehealth: Payer: Self-pay | Admitting: Thoracic Surgery (Cardiothoracic Vascular Surgery)

## 2022-12-20 ENCOUNTER — Ambulatory Visit: Payer: BC Managed Care – PPO | Attending: Adult Health | Admitting: Adult Health

## 2022-12-20 ENCOUNTER — Encounter: Payer: Self-pay | Admitting: Adult Health

## 2022-12-20 VITALS — BP 124/58 | HR 90 | Ht 66.0 in | Wt 228.2 lb

## 2022-12-20 DIAGNOSIS — Z951 Presence of aortocoronary bypass graft: Secondary | ICD-10-CM | POA: Diagnosis not present

## 2022-12-20 DIAGNOSIS — E7800 Pure hypercholesterolemia, unspecified: Secondary | ICD-10-CM | POA: Diagnosis not present

## 2022-12-20 DIAGNOSIS — E118 Type 2 diabetes mellitus with unspecified complications: Secondary | ICD-10-CM | POA: Diagnosis not present

## 2022-12-20 DIAGNOSIS — I10 Essential (primary) hypertension: Secondary | ICD-10-CM

## 2022-12-20 DIAGNOSIS — Z794 Long term (current) use of insulin: Secondary | ICD-10-CM

## 2022-12-20 NOTE — Patient Instructions (Signed)
Medication Instructions:  No Changes *If you need a refill on your cardiac medications before your next appointment, please call your pharmacy*   Lab Work: No labs If you have labs (blood work) drawn today and your tests are completely normal, you will receive your results only by: [place] (if you have MyChart) OR A paper copy in the mail If you have any lab test that is abnormal or we need to change your treatment, we will call you to review the results.   Testing/Procedures: No Testing   Follow-Up: At [facility] you and your health needs are our priority.  As part of our continuing mission to provide you with exceptional heart care, we have created designated Provider Care Teams.  These Care Teams include your primary Cardiologist (physician) and Advanced Practice Providers (APPs -  Physician Assistants and Nurse Practitioners) who all work together to provide you with the care you need, when you need it.  We recommend signing up for the patient portal called "MyChart".  Sign up information is provided on this After Visit Summary.  MyChart is used to connect with patients for Virtual Visits (Telemedicine).  Patients are able to view lab/test results, encounter notes, upcoming appointments, etc.  Non-urgent messages can be sent to your provider as well.   To learn more about what you can do with MyChart, go to [email or web address].    Your next appointment:   1 month(s)  Provider:   Jory Sims, DNP, ANP

## 2022-12-21 ENCOUNTER — Inpatient Hospital Stay: Admission: RE | Admit: 2022-12-21 | Payer: BC Managed Care – PPO | Source: Ambulatory Visit

## 2022-12-21 ENCOUNTER — Encounter: Payer: Self-pay | Admitting: Family Medicine

## 2022-12-21 ENCOUNTER — Other Ambulatory Visit: Payer: Self-pay

## 2022-12-21 ENCOUNTER — Ambulatory Visit: Payer: BC Managed Care – PPO | Admitting: Family Medicine

## 2022-12-21 VITALS — BP 122/70 | HR 85 | Temp 98.6°F | Resp 16 | Ht 66.0 in | Wt 229.2 lb

## 2022-12-21 DIAGNOSIS — I214 Non-ST elevation (NSTEMI) myocardial infarction: Secondary | ICD-10-CM | POA: Diagnosis not present

## 2022-12-21 DIAGNOSIS — E1169 Type 2 diabetes mellitus with other specified complication: Secondary | ICD-10-CM | POA: Diagnosis not present

## 2022-12-21 DIAGNOSIS — I10 Essential (primary) hypertension: Secondary | ICD-10-CM

## 2022-12-21 DIAGNOSIS — D500 Iron deficiency anemia secondary to blood loss (chronic): Secondary | ICD-10-CM

## 2022-12-21 DIAGNOSIS — Z87891 Personal history of nicotine dependence: Secondary | ICD-10-CM

## 2022-12-21 DIAGNOSIS — I5032 Chronic diastolic (congestive) heart failure: Secondary | ICD-10-CM

## 2022-12-21 DIAGNOSIS — E785 Hyperlipidemia, unspecified: Secondary | ICD-10-CM

## 2022-12-21 DIAGNOSIS — Z122 Encounter for screening for malignant neoplasm of respiratory organs: Secondary | ICD-10-CM

## 2022-12-21 LAB — BASIC METABOLIC PANEL
BUN: 15 mg/dL (ref 6–23)
CO2: 23 meq/L (ref 19–32)
Calcium: 9.5 mg/dL (ref 8.4–10.5)
Chloride: 104 meq/L (ref 96–112)
Creatinine, Ser: 0.69 mg/dL (ref 0.40–1.20)
GFR: 93.35 mL/min (ref >60.00)
Glucose, Bld: 235 mg/dL — ABNORMAL HIGH (ref 70–99)
Potassium: 4.3 meq/L (ref 3.5–5.1)
Sodium: 137 meq/L (ref 135–145)

## 2022-12-21 LAB — CBC
HCT: 38.7 % (ref 36.0–46.0)
Hemoglobin: 12.3 g/dL (ref 12.0–15.0)
MCHC: 31.8 g/dL (ref 30.0–36.0)
MCV: 93.1 fL (ref 78.0–100.0)
Platelets: 637 10*3/uL — ABNORMAL HIGH (ref 150.0–400.0)
RBC: 4.15 Mil/uL (ref 3.87–5.11)
RDW: 16.3 % — ABNORMAL HIGH (ref 11.5–15.5)
WBC: 11.3 10*3/uL — ABNORMAL HIGH (ref 4.0–10.5)

## 2022-12-21 NOTE — Patient Instructions (Addendum)
A few things to remember from today's visit:  Essential hypertension - Plan: Basic metabolic panel  Iron deficiency anemia due to chronic blood loss - Plan: CBC  Chronic heart failure with preserved ejection fraction (HCC)  Hyperlipidemia associated with type 2 diabetes mellitus (HCC)  NSTEMI (non-ST elevated myocardial infarction) (HCC)  No changes today. Please discuss other options for vaginal bleeding.  If you need refills for medications you take chronically, please call your pharmacy. Do not use My Chart to request refills or for acute issues that need immediate attention. If you send a my chart message, it may take a few days to be addressed, specially if I am not in the office.  Please be sure medication list is accurate. If a new problem present, please set up appointment sooner than planned today.

## 2022-12-21 NOTE — Progress Notes (Signed)
ACUTE VISIT Chief Complaint  Patient presents with   Hospitalization Follow-up   HPI: [name] is a 62 y.o. female with a PMHx significant for HFpEF, HTN, NSTEMI, S/P CABG x 2, HLD, and DM II who is here today for hospital follow up.  Patient was seen here on 11/21 by Dr. Salomon Fick for chest pain and advised to proceed to the ED for further evaluation.  She was admitted to the hospital on 11/21. She presented to the ED with 6 days hx of CP She underwent cardiac cath which revealed a 70% left main stenosis in a left dominant circulation.   She underwent CABG x 2 utilizing LIMA to LAD and SVG to OM as well as endoscopic harvest of the right greater saphenous vein on 11/30/22. She was discharged home on 11/30.  TOC call on 12/15/22.  No complications during or after procedure, she was extubated same day of surgery. Started on Plavix 75 mg daily. Diastolic dysfunction, echo 11/26/22 LVEF 55-60%, moderate LVH, and normal diastolic function (grade I in 07/2534).. Negative for orthopnea or PND.  States that following her hospitalization, she was given potassium supplementation and furosemide to take for five days.   She mentions she has had some fatigue since returning home, but says her appetite has been good.  She was advised to follow a low sodium diet, and she is working on implementing that.  She was also advised to walk for 5 minutes 3x daily.  Pending to start cardiac rehab. HH services were not deemed necessary.  She has recovered well from surgery, chest wall pain greatly improved. Started on Plavix 75 mg and aspirin 81 mg daily.   She followed with cardiology yesterday and will follow with endocrinology on 01/28/2023 for DM II.  Prior to her hospitalization, she was taking lisinopril and hydrochlorothiazide, but was changed to amlodipine 5 mg daily and metoprolol tartrate 50 mg bid.   Negative for unusual or severe headache, visual changes, exertional chest  pain, dyspnea, palpitations, focal weakness, or edema.  Lab Results  Component Value Date   CREATININE 0.62 12/03/2022   BUN 16 12/03/2022   NA 138 12/03/2022   K 3.8 12/03/2022   CL 108 12/03/2022   CO2 23 12/03/2022   Hyperlipidemia:  She is still taking atorvastatin 80 mg daily and zetia 10 mg daily.  Lab Results  Component Value Date   CHOL 102 11/26/2022   HDL 36 (L) 11/26/2022   LDLCALC 42 11/26/2022   LDLDIRECT 137.0 12/10/2020   TRIG 122 11/26/2022   CHOLHDL 2.8 11/26/2022   Diabetes Mellitus II:  - Medications: Currently on Mounjaro 10 mg weekly.   Lab Results  Component Value Date   HGBA1C 7.5 (H) 11/26/2022   Iron def anemia: Hx of DUB. She mentions she had been having significant postmenopausal bleeding prior to her hospitalization. She needed blood transfusion prior to her surgery. She was prescribed Megace 40 mg, but advised to stop at her last cardiology appointment.  Started with some vaginal bleeding since stopping medication. She follows with cardiologist.  Lab Results  Component Value Date   WBC 13.0 (H) 12/03/2022   HGB 10.0 (L) 12/03/2022   HCT 31.4 (L) 12/03/2022   MCV 95.7 12/03/2022   PLT 339 12/03/2022   Received B12 during hospitalizations, she is not  on B12 supplementation now.   Review of Systems  Constitutional:  Positive for activity change and fatigue. Negative for appetite change and fever.  HENT:  Negative for  nosebleeds, sore throat and trouble swallowing.   Respiratory:  Negative for cough and wheezing.   Gastrointestinal:  Negative for abdominal pain, blood in stool, nausea and vomiting.       Negative for changes in bowel habits.  Genitourinary:  Positive for menstrual problem. Negative for decreased urine volume, dysuria and hematuria.  Skin:  Negative for rash.  Neurological:  Negative for syncope and facial asymmetry.  Psychiatric/Behavioral:  Negative for confusion and hallucinations.    See other pertinent positives  and negatives in HPI.  Current Outpatient Medications on File Prior to Visit  Medication Sig Dispense Refill   acetaminophen (TYLENOL) 500 MG tablet Take 1,000 mg by mouth every 6 (six) hours as needed for mild pain, moderate pain, fever or headache.     amLODipine (NORVASC) 5 MG tablet Take 1 tablet (5 mg total) by mouth daily. 30 tablet 1   aspirin 81 MG EC tablet Take 81 mg by mouth daily.     atorvastatin (LIPITOR) 80 MG tablet Take 1 tablet (80 mg total) by mouth daily. Due for physical. 90 tablet 0   calcium carbonate (OSCAL) 1500 (600 Ca) MG TABS tablet Take 1,200 mg of elemental calcium by mouth daily with breakfast.     clopidogrel (PLAVIX) 75 MG tablet Take 1 tablet (75 mg total) by mouth daily. 30 tablet 1   ezetimibe (ZETIA) 10 MG tablet Take 1 tablet (10 mg total) by mouth daily. 90 tablet 3   ferrous sulfate 325 (65 FE) MG tablet Take 325 mg by mouth daily with breakfast.     Fexofenadine HCl (ALLEGRA PO) Take 120 mg by mouth daily.     Insulin Pen Needle 32G X 4 MM MISC Use 4x a day 400 each 3   insulin regular human CONCENTRATED (HUMULIN R U-500 KWIKPEN) 500 UNIT/ML KwikPen Inject 80-100 Units into the skin 3 (three) times daily before meals. (Patient taking differently: Inject 100 Units into the skin 3 (three) times daily before meals.) 18 mL 3   Loratadine (CLARITIN PO) Take 10 mg by mouth daily.     magnesium oxide (MAG-OX) 400 MG tablet Take 400 mg by mouth daily.     megestrol (MEGACE) 40 MG tablet Take 40 mg by mouth daily. TAKE ONE TABLET BY MOUTH TWO TIMES A DAY FOR 3 DAYS AND THEN 1 TABLET DAILY FOR 15 DAYS     metoprolol tartrate (LOPRESSOR) 50 MG tablet Take 1 tablet (50 mg total) by mouth 2 (two) times daily. 60 tablet 1   Multiple Vitamin (MULTIVITAMIN WITH MINERALS) TABS tablet Take 1 tablet by mouth daily.     oxyCODONE (OXY IR/ROXICODONE) 5 MG immediate release tablet Take 1 tablet (5 mg total) by mouth every 3 (three) hours as needed for severe pain (pain score  7-10). 28 tablet 0   Potassium 99 MG TABS Take 1 tablet (99 mg total) by mouth daily.     tirzepatide (MOUNJARO) 10 MG/0.5ML Pen Inject 10 mg into the skin once a week. (Patient taking differently: Inject 10 mg into the skin once a week. Thursday) 6 mL 3   zinc gluconate 50 MG tablet Take 50 mg by mouth daily.     No current facility-administered medications on file prior to visit.    Past Medical History:  Diagnosis Date   CTS (carpal tunnel syndrome)    DM (diabetes mellitus) (HCC)    Dyslipidemia    Heart murmur    had ECHO 01-2021   HTN (hypertension)  Hypercholesterolemia    Seasonal allergies    Smoker    Warts, genital    No Known Allergies  Social History   Socioeconomic History   Marital status: Married    Spouse name: Not on file   Number of children: Not on file   Years of education: Not on file   Highest education level: Associate degree: academic program  Occupational History   Not on file  Tobacco Use   Smoking status: Former    Current packs/day: 0.00    Average packs/day: 1 pack/day for 26.0 years (26.0 ttl pk-yrs)    Types: Cigarettes    Start date: 08/12/1981    Quit date: 08/13/2007    Years since quitting: 15.3   Smokeless tobacco: Never   Tobacco comments:     She smoked one pack per day for 26 years but quit in 2009.  Vaping Use   Vaping status: Never Used  Substance and Sexual Activity   Alcohol use: Yes    Comment: social   Drug use: No   Sexual activity: Not on file  Other Topics Concern   Not on file  Social History Narrative   Works 3rd shift in [employer], married.  She smoked one pack per day for 26 years but quit in 2009.         Social Drivers of Corporate investment banker Strain: Low Risk  (11/25/2022)   Overall Financial Resource Strain (CARDIA)    Difficulty of Paying Living Expenses: Not hard at all  Food Insecurity: No Food Insecurity (12/07/2022)   Hunger Vital Sign    Worried About Running Out of Food in  the Last Year: Never true    Ran Out of Food in the Last Year: Never true  Transportation Needs: No Transportation Needs (12/07/2022)   PRAPARE - Administrator, Civil Service (Medical): No    Lack of Transportation (Non-Medical): No  Physical Activity: Unknown (11/25/2022)   Exercise Vital Sign    Days of Exercise per Week: Patient declined    Minutes of Exercise per Session: Not on file  Stress: No Stress Concern Present (11/25/2022)   [employer] of Occupational Health - Occupational Stress Questionnaire    Feeling of Stress : Only a little  Social Connections: Socially Isolated (11/25/2022)   Social Connection and Isolation Panel [NHANES]    Frequency of Communication with Friends and Family: Once a week    Frequency of Social Gatherings with Friends and Family: Never    Attends Religious Services: Never    Database administrator or Organizations: No    Attends Engineer, structural: Not on file    Marital Status: Married   Today's Vitals   12/21/22 1301  BP: 122/70  Pulse: 85  Resp: 16  Temp: 98.6 F (37 C)  TempSrc: Oral  SpO2: 98%  Weight: 229 lb 4 oz (104 kg)  Height: 5\' 6"  (1.676 m)   Body mass index is 37 kg/m.  Wt Readings from Last 3 Encounters:  12/21/22 229 lb 4 oz (104 kg)  12/20/22 228 lb 3.2 oz (103.5 kg)  12/04/22 232 lb (105.2 kg)   Body mass index is 37 kg/m.  Physical Exam Vitals and nursing note reviewed.  Constitutional:      General: She is not in acute distress.    Appearance: She is well-developed.  HENT:     Head: Normocephalic and atraumatic.     Mouth/Throat:  Mouth: Mucous membranes are moist.     Pharynx: Oropharynx is clear.  Eyes:     Conjunctiva/sclera: Conjunctivae normal.  Cardiovascular:     Rate and Rhythm: Normal rate and regular rhythm.     Pulses:          Dorsalis pedis pulses are 2+ on the right side and 2+ on the left side.     Heart sounds: Murmur (Soft SEM LUSB) heard.     Comments:  Bilateral trace pitting edema Pulmonary:     Effort: Pulmonary effort is normal. No respiratory distress.     Breath sounds: Normal breath sounds.  Abdominal:     Palpations: Abdomen is soft. There is no hepatomegaly or mass.     Tenderness: There is no abdominal tenderness.  Musculoskeletal:     Right lower leg: Pitting Edema present.     Left lower leg: Pitting Edema present.  Lymphadenopathy:     Cervical: No cervical adenopathy.  Skin:    General: Skin is warm.     Findings: No erythema or rash.  Neurological:     General: No focal deficit present.     Mental Status: She is alert and oriented to person, place, and time.     Cranial Nerves: No cranial nerve deficit.     Gait: Gait normal.  Psychiatric:        Mood and Affect: Mood and affect normal.   ASSESSMENT AND PLAN:  Ms. Janus was seen today for hospital follow up.  Lab Results  Component Value Date   NA 137 12/21/2022   CL 104 12/21/2022   K 4.3 12/21/2022   CO2 23 12/21/2022   BUN 15 12/21/2022   CREATININE 0.69 12/21/2022   GFR 93.35 12/21/2022   CALCIUM 9.5 12/21/2022   PHOS 4.5 11/26/2022   ALBUMIN 4.1 09/14/2022   GLUCOSE 235 (H) 12/21/2022   Lab Results  Component Value Date   WBC 11.3 (H) 12/21/2022   HGB 12.3 12/21/2022   HCT 38.7 12/21/2022   MCV 93.1 12/21/2022   PLT 637.0 (H) 12/21/2022    Hyperlipidemia associated with type 2 diabetes mellitus (HCC) Continue Atorvastatin 80 mg daily and Zetia 10 mg daily as well as low fat diet. LDL at goal, 42 in 11/2022.  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 Currently on Metoprolol tartrate 50 mg bid, Atorvastatin 80 mg daily, Plavix 75 daily,and aspirin 81 mg daily. Continue aggressive management of CV risk factors.  Essential hypertension BP adequately controlled. Continue Metoprolol tartrate 50 mg bid and Amlodipine 5 mg daily  as well as low salt diet. Monitor BP at home.  -     Basic metabolic panel; Future  Iron deficiency anemia due to chronic blood loss Hx of post menopausal DUB. She is not longer on Megestrol. May benefit for IUD. Follows with gyn.  -     CBC; Future  Chronic heart failure with preserved ejection fraction (HCC) Last echo in 11.2024 normal diastolic dysfunction. Continue Metoprolol 50 mg bid. Continue low salt diet.  Obesity, morbid (HCC)  with BMI 37 Comorbilities: DM II, HTN,HLD. She is on [facility] for DM II management. Continue walking a few times per week as tolerated, pending to start cardiac rehab.  I spent a total of 39 minutes in both face to face and non face to face activities for this visit on the date of this encounter.  During this time history was obtained and documented, examination was performed, prior labs/imaging reviewed, and assessment/plan discussed.  Return in about 6 months (around 06/21/2023) for CPE.  I, Suanne Marker, acting as a scribe for Felecia Stanfill [place], MD., have documented all relevant documentation on the behalf of Catherine Tuccillo [place], MD, as directed by  Dyron Kawano [place], MD while in the presence of Catherine Meno [place], MD.   I, Suanne Marker, have reviewed all documentation for this visit. The documentation on 12/21/22 for the exam, diagnosis, procedures, and orders are all accurate and complete.  Catherine Fifer G. [place], MD  [facility]. [place] office.

## 2022-12-22 ENCOUNTER — Other Ambulatory Visit: Payer: Self-pay

## 2022-12-22 ENCOUNTER — Other Ambulatory Visit: Payer: BC Managed Care – PPO

## 2022-12-22 NOTE — Patient Outreach (Signed)
Care Management  Transitions of Care Program Transitions of Care Post-discharge week 3   12/22/2022 Name: Catherine Moon MRN: [id number] DOB: [birth year]  Subjective: Catherine Moon is a 62 y.o. year old female who is a primary care patient of [place], Timoteo Expose, MD. The Care Management team Engaged with patient by telephone to assess and address transitions of care needs.   Consent to Services:  Patient was given information about care management services, agreed to services, and gave verbal consent to participate.   Assessment:   Patient voices she is doing well-has had a good week but busy week with seeing several of her MDs.She got good report at both visits. Wgt and cbgs have been stable. Denies any acute sxs/issues or concerns at present.         SDOH Interventions    Flowsheet Row Telephone from 12/07/2022 in [place] POPULATION HEALTH DEPARTMENT  SDOH Interventions   Food Insecurity Interventions Intervention Not Indicated  Housing Interventions Intervention Not Indicated  Transportation Interventions Intervention Not Indicated, Patient Resources (Friends/Family)  Utilities Interventions Intervention Not Indicated        Goals Addressed             This Visit's Progress    TOC Care Plan       Current Barriers:  Knowledge Deficits related to plan of care for management of CAD  Patient had CABG x 2   RNCM Clinical Goal(s):  Patient will work with the Care Management team over the next 30 days to address Transition of Care Barriers: mgmt of chronic conditions take all medications exactly as prescribed and will call provider for medication related questions as evidenced by EMR attend all scheduled medical appointments: PCP and Specialists as evidenced by EMR  through collaboration with RN Care manager, provider, and care team.   Interventions: Evaluation of current treatment plan related to  self management and patient's adherence to plan as  established by provider Attend Cardiac Rehabilitation as per ordered  CAD Interventions: (Status:  Goal on track:  Yes.) Short Term Goal Assessed understanding of CAD diagnosis Medications reviewed including medications utilized in CAD treatment plan Provided education on Importance of limiting foods high in cholesterol Counseled on importance of regular laboratory monitoring as prescribed Counseled on the importance of exercise goals with target of 150 minutes per week Reviewed Importance of taking all medications as prescribed Reviewed Importance of attending all scheduled provider appointments Advised patient to discuss continuing to take Megace with with provider Assessed for surgical site healing progress-pt reports incision looks good-saw cardiology this week-has virtual appt on 12/24/22 with cardiac surgeon Educated on low salt/heart healthy diet- limiting salt intake, reading food labels,etc- pt voices she is cutting back on Diet Coke and canned soups Discussed wgt monitoring in the home-pt has scale-weighing daily-wgt 224.2 lbs -Discussed cardiac rehab with pt-she will call and follow with rehab  Patient Goals/Self-Care Activities: Participate in Transition of Care Program/Attend [facility] scheduled calls Notify RN Care Manager of TOC call rescheduling needs Take all medications as prescribed Attend all scheduled provider appointments Call pharmacy for medication refills 3-7 days in advance of running out of medications Weigh daily and report any abnormal wgt changes  Follow Up Plan:  Telephone follow up appointment with care management team member scheduled for:  12/28/22-11:30 am The patient has been provided with contact information for the care management team and has been advised to call with any health related questions or concerns.  Plan: The patient has been provided with contact information for the care management team and has been advised to call with any health  related questions or concerns.  Follow up appt scheduled with pt for 12/28/22-11:30 am   Antionette Fairy, RN,BSN,CCM RN Care Manager Transitions of Care  [place]Population [facility]  Direct Phone: [phone] Toll Free: [phone] Fax: [phone]

## 2022-12-22 NOTE — Patient Instructions (Signed)
Visit Information  Thank you for taking time to visit with me today. Please don't hesitate to contact me if I can be of assistance to you before our next scheduled telephone appointment.  Our next appointment is by telephone on 12/28/22 at 11:30 am  Following is a copy of your care plan:   Goals Addressed             This Visit's Progress    TOC Care Plan       Current Barriers:  Knowledge Deficits related to plan of care for management of CAD  Patient had CABG x 2   RNCM Clinical Goal(s):  Patient will work with the Care Management team over the next 30 days to address Transition of Care Barriers: mgmt of chronic conditions take all medications exactly as prescribed and will call provider for medication related questions as evidenced by EMR attend all scheduled medical appointments: PCP and Specialists as evidenced by EMR  through collaboration with RN Care manager, provider, and care team.   Interventions: Evaluation of current treatment plan related to  self management and patient's adherence to plan as established by provider Attend Cardiac Rehabilitation as per ordered  CAD Interventions: (Status:  Goal on track:  Yes.) Short Term Goal Assessed understanding of CAD diagnosis Medications reviewed including medications utilized in CAD treatment plan Provided education on Importance of limiting foods high in cholesterol Counseled on importance of regular laboratory monitoring as prescribed Counseled on the importance of exercise goals with target of 150 minutes per week Reviewed Importance of taking all medications as prescribed Reviewed Importance of attending all scheduled provider appointments Advised patient to discuss continuing to take Megace with with provider Assessed for surgical site healing progress-pt reports incision looks good-saw cardiology this week-has virtual appt on 12/24/22 with cardiac surgeon Educated on low salt/heart healthy diet- limiting salt intake,  reading food labels,etc- pt voices she is cutting back on Diet Coke and canned soups Discussed wgt monitoring in the home-pt has scale-weighing daily-wgt 224.2 lbs -Discussed cardiac rehab with pt-she will call and follow with rehab  Patient Goals/Self-Care Activities: Participate in Transition of Care Program/Attend [facility] scheduled calls Notify RN Care Manager of TOC call rescheduling needs Take all medications as prescribed Attend all scheduled provider appointments Call pharmacy for medication refills 3-7 days in advance of running out of medications Weigh daily and report any abnormal wgt changes  Follow Up Plan:  Telephone follow up appointment with care management team member scheduled for:  12/28/22-11:30 am The patient has been provided with contact information for the care management team and has been advised to call with any health related questions or concerns.         Patient verbalizes understanding of instructions and care plan provided today and agrees to view in MyChart. Active MyChart status and patient understanding of how to access instructions and care plan via MyChart confirmed with patient.     The patient has been provided with contact information for the care management team and has been advised to call with any health related questions or concerns.   Please call the care guide team at [phone] if you need to cancel or reschedule your appointment.   Please call the Suicide and Crisis Lifeline: 988 call the [place] National Suicide Prevention Lifeline: 4197121019 or TTY: 562-495-2923 TTY 2392790032) to talk to a trained counselor if you are experiencing a Mental Health or Behavioral Health Crisis or need someone to talk to.  Antionette Fairy, RN,BSN,CCM RN Care  Manager Transitions of Care  [place]Population [facility]  Direct Phone: [phone] Toll Free: [phone] Fax: [phone]

## 2022-12-24 ENCOUNTER — Ambulatory Visit (INDEPENDENT_AMBULATORY_CARE_PROVIDER_SITE_OTHER): Payer: Self-pay | Admitting: Thoracic Surgery (Cardiothoracic Vascular Surgery)

## 2022-12-24 DIAGNOSIS — Z951 Presence of aortocoronary bypass graft: Secondary | ICD-10-CM

## 2022-12-24 NOTE — Progress Notes (Signed)
     [address]       [place] [address]             [phone]       Patient: Home Provider: Office Consent for Telemedicine visit obtained.  Today's visit was completed via a real-time telehealth (see specific modality noted below). The patient/authorized person provided oral consent at the time of the visit to engage in a telemedicine encounter with the present provider at [facility]. The patient/authorized person was informed of the potential benefits, limitations, and risks of telemedicine. The patient/authorized person expressed understanding that the laws that protect confidentiality also apply to telemedicine. The patient/authorized person acknowledged understanding that telemedicine does not provide emergency services and that he or she would need to call 911 or proceed to the nearest hospital for help if such a need arose.   Total time spent in the clinical discussion 10 minutes.  Telehealth Modality: Phone visit (audio only)  I had a telephone visit with  [name] who is s/p CABG.  Overall doing well.  Pain is minimal.  Ambulating well. Vitals have been stable.  Marwa Varin Haskett will see [place] back in 1 month with a chest x-ray for cardiac rehab clearance.  Charod Slawinski Keane Scrape

## 2022-12-28 ENCOUNTER — Other Ambulatory Visit: Payer: Self-pay

## 2022-12-28 NOTE — Patient Outreach (Signed)
Care Management  Transitions of Care Program Transitions of Care Post-discharge week 4   12/28/2022 Name: Catherine Moon MRN: [id number] DOB: [birth year]  Subjective: Catherine Moon is a 62 y.o. year old female who is a primary care patient of [place], Timoteo Expose, MD. The Care Management team Engaged with patient by telephone to assess and address transitions of care needs.   Consent to Services:  Patient was given information about care management services, agreed to services, and gave verbal consent to participate.   Assessment:   Pt voices she is doing well. She was able to get out yest and enjoy a nice dinner with spouse a t one of her favorite restaurants. Pt voices he read the menu and made healthy food choices. Wgt stable. Surg incision continues to heal and no issues. She had telehealth visit with surgeon last week. Denies any RN CM needs or concerns at this time.         SDOH Interventions    Flowsheet Row Telephone from 12/07/2022 in [place] POPULATION HEALTH DEPARTMENT  SDOH Interventions   Food Insecurity Interventions Intervention Not Indicated  Housing Interventions Intervention Not Indicated  Transportation Interventions Intervention Not Indicated, Patient Resources (Friends/Family)  Utilities Interventions Intervention Not Indicated        Goals Addressed             This Visit's Progress    TOC Care Plan       Current Barriers:  Knowledge Deficits related to plan of care for management of CAD  Patient had CABG x 2   RNCM Clinical Goal(s):  Patient will work with the Care Management team over the next 30 days to address Transition of Care Barriers: mgmt of chronic conditions take all medications exactly as prescribed and will call provider for medication related questions as evidenced by EMR attend all scheduled medical appointments: PCP and Specialists as evidenced by EMR  through collaboration with RN Care manager, provider, and care  team.   Interventions: Evaluation of current treatment plan related to  self management and patient's adherence to plan as established by provider Attend Cardiac Rehabilitation as per ordered  CAD Interventions: (Status:  Goal on track:  Yes.) Short Term Goal Assessed understanding of CAD diagnosis Medications reviewed-pt voices that she was taking Nexium-not listed on med list-states has been taking for a while-read up that med interferes wth Plavix so she has stopped taking it-will discuss with provider about alternative meds to take in place Discussed with pt Megace usage-she did discuss with surgeon during telehealth call visit and was advised she could resume med in 3wks Discussed wgt monitoring in the home-pt has scale-weighing daily-222.6lbs -Discussed cardiac rehab with pt-she discussed with surgeon-she will have to wait until f/u appt in 01/20/23- until then medically cleared for cardiac clearance  Patient Goals/Self-Care Activities: Participate in Transition of Care Program/Attend [facility] scheduled calls Notify RN Care Manager of TOC call rescheduling needs Take all medications as prescribed Attend all scheduled provider appointments Call pharmacy for medication refills 3-7 days in advance of running out of medications Weigh daily and report any abnormal wgt changes  Follow Up Plan:  Telephone follow up appointment with care management team member scheduled for:   am The patient has been provided with contact information for the care management team and has been advised to call with any health related questions or concerns.         Plan: The patient has been provided with contact information for the  care management team and has been advised to call with any health related questions or concerns.  Follow up appt scheduled with pt for 01/04/23-11:30 am  Antionette Fairy, RN,BSN,CCM RN Care Manager Transitions of Care  [place]/Population Health  Direct Phone:  [phone] Toll Free: [phone] Fax: [phone]

## 2022-12-28 NOTE — Patient Instructions (Signed)
Visit Information  Thank you for taking time to visit with me today. Please don't hesitate to contact me if I can be of assistance to you before our next scheduled telephone appointment.  Our next appointment is by telephone on 01/04/23 at 11:30 am.  Following is a copy of your care plan:   Goals Addressed             This Visit's Progress    TOC Care Plan       Current Barriers:  Knowledge Deficits related to plan of care for management of CAD  Patient had CABG x 2   RNCM Clinical Goal(s):  Patient will work with the Care Management team over the next 30 days to address Transition of Care Barriers: mgmt of chronic conditions take all medications exactly as prescribed and will call provider for medication related questions as evidenced by EMR attend all scheduled medical appointments: PCP and Specialists as evidenced by EMR  through collaboration with RN Care manager, provider, and care team.   Interventions: Evaluation of current treatment plan related to  self management and patient's adherence to plan as established by provider Attend Cardiac Rehabilitation as per ordered  CAD Interventions: (Status:  Goal on track:  Yes.) Short Term Goal Assessed understanding of CAD diagnosis Medications reviewed-pt voices that she was taking Nexium-not listed on med list-states has been taking for a while-read up that med interferes wth Plavix so she has stopped taking it-will discuss with provider about alternative meds to take in place Discussed with pt Megace usage-she did discuss with surgeon during telehealth call visit and was advised she could resume med in 3wks Discussed wgt monitoring in the home-pt has scale-weighing daily-222.6lbs -Discussed cardiac rehab with pt-she discussed with surgeon-she will have to wait until f/u appt in 01/20/23- until then medically cleared for cardiac clearance  Patient Goals/Self-Care Activities: Participate in Transition of Care Program/Attend [facility]  scheduled calls Notify RN Care Manager of TOC call rescheduling needs Take all medications as prescribed Attend all scheduled provider appointments Call pharmacy for medication refills 3-7 days in advance of running out of medications Weigh daily and report any abnormal wgt changes  Follow Up Plan:  Telephone follow up appointment with care management team member scheduled for:   am The patient has been provided with contact information for the care management team and has been advised to call with any health related questions or concerns.         Patient verbalizes understanding of instructions and care plan provided today and agrees to view in MyChart. Active MyChart status and patient understanding of how to access instructions and care plan via MyChart confirmed with patient.     The patient has been provided with contact information for the care management team and has been advised to call with any health related questions or concerns.   Please call the care guide team at [phone] if you need to cancel or reschedule your appointment.   Please call the [place] National Suicide Prevention Lifeline: 3210225434 or TTY: (985)593-8357 TTY 272-292-0727) to talk to a trained counselor call 1-800-273-TALK (toll free, 24 hour hotline) if you are experiencing a Mental Health or Behavioral Health Crisis or need someone to talk to.  Antionette Fairy, RN,BSN,CCM RN Care Manager Transitions of Care  [place]/Population Health  Direct Phone: [phone] Toll Free: [phone] Fax: [phone]

## 2023-01-04 ENCOUNTER — Other Ambulatory Visit: Payer: Self-pay

## 2023-01-04 NOTE — Patient Outreach (Signed)
  Care Management  Transitions of Care Program Transitions of Care Post-discharge Week 5   01/04/2023 Name: Catherine Moon MRN: [id number] DOB: July 04, 1960  Subjective: Catherine Moon is a 62 y.o. year old female who is a primary care patient of Jordan, Dickey MATSU, MD. The Care Management team  Engaged with patient by telephone to assess and address transitions of care needs.   Consent to Services:  Patient has successfully completed 30-day TOC program. Agreeable to transfer to longitudinal RN CM and follow up appt scheduled.  Assessment:     Patient doing well. She reports she had a nice and quiet holiday with spouse. Denies any acute issues or concerns at present She continues to recover and do well following recent NSTEMI and CABG. Denies any RN CM needs or concern at this times      SDOH Interventions    Flowsheet Row Telephone from 12/07/2022 in [place] POPULATION HEALTH DEPARTMENT  SDOH Interventions   Food Insecurity Interventions Intervention Not Indicated  Housing Interventions Intervention Not Indicated  Transportation Interventions Intervention Not Indicated, Patient Resources (Friends/Family)  Utilities Interventions Intervention Not Indicated        Goals Addressed             This Visit's Progress    COMPLETED: TOC Care Plan       Current Barriers:  Knowledge Deficits related to plan of care for management of CAD  Patient had CABG x 2   RNCM Clinical Goal(s):  Patient will work with the Care Management team over the next 30 days to address Transition of Care Barriers: mgmt of chronic conditions take all medications exactly as prescribed and will call provider for medication related questions as evidenced by EMR attend all scheduled medical appointments: PCP and Specialists as evidenced by EMR  through collaboration with RN Care manager, provider, and care team.   Interventions: Evaluation of current treatment plan related to  self  management and patient's adherence to plan as established by provider Attend Cardiac Rehabilitation as per ordered  CAD Interventions: (Status:  Goal Met.) Short Term Goal Assessed understanding of CAD diagnosis and mgmt Assessed for healing s/p CABG- pt continues to report incision is healing nicely Discussed wgt monitoring in the home-pt has new scale-weighing daily-223.8lbs -Discussed cardiac rehab with pt-she discussed with surgeon-she will have to wait until f/u appt in 01/20/23- until then medically cleared for cardiac clearance  Patient Goals/Self-Care Activities: Weigh daily and report any abnormal wgt changes Adhere to low salt/heart healthy/carb modified diet Monitor incision site daily for wound healing and any s/s of infection Increase walking/daily activity  Follow Up Plan:  The patient has been provided with contact information for the care management team and has been advised to call with any health related questions or concerns.  Patient has completed 30 day TOC program-agreeable to transfer to longitudinal RN CM and appt scheduled for 01/11/23- 2pm.        Plan: The patient has been provided with contact information for the care management team and has been advised to call with any health related questions or concerns.  Follow up appt scheduled with longitudinal RN CM for 01/11/23- 2pm.   Rama Pilling, RN,BSN,CCM RN Care Manager Transitions of Care  [place]/Population Health  Direct Phone: [phone] Toll Free: [phone] Fax: [phone]

## 2023-01-04 NOTE — Patient Instructions (Signed)
 Visit Information  Thank you for taking time to visit with me today. It has been a pleasure talking with you weekly. I hope you have a healthy and happy new year! Please don't hesitate to contact me if I can be of assistance to you before your next scheduled telephone appointment with your new assigned nurse.  Your next appointment is by telephone on 01/11/23 at 2pm  Following is a copy of your care plan:   Goals Addressed             This Visit's Progress    COMPLETED: TOC Care Plan       Current Barriers:  Knowledge Deficits related to plan of care for management of CAD  Patient had CABG x 2   RNCM Clinical Goal(s):  Patient will work with the Care Management team over the next 30 days to address Transition of Care Barriers: mgmt of chronic conditions take all medications exactly as prescribed and will call provider for medication related questions as evidenced by EMR attend all scheduled medical appointments: PCP and Specialists as evidenced by EMR  through collaboration with RN Care manager, provider, and care team.   Interventions: Evaluation of current treatment plan related to  self management and patient's adherence to plan as established by provider Attend Cardiac Rehabilitation as per ordered  CAD Interventions: (Status:  Goal Met.) Short Term Goal Assessed understanding of CAD diagnosis and mgmt Assessed for healing s/p CABG- pt continues to report incision is healing nicely Discussed wgt monitoring in the home-pt has new scale-weighing daily-223.8lbs -Discussed cardiac rehab with pt-she discussed with surgeon-she will have to wait until f/u appt in 01/20/23- until then medically cleared for cardiac clearance  Patient Goals/Self-Care Activities: Weigh daily and report any abnormal wgt changes Adhere to low salt/heart healthy/carb modified diet Monitor incision site daily for wound healing and any s/s of infection Increase walking/daily activity  Follow Up Plan:  The  patient has been provided with contact information for the care management team and has been advised to call with any health related questions or concerns.  Patient has completed 30 day TOC program-agreeable to transfer to longitudinal RN CM and appt scheduled for 01/11/23- 2pm.        Patient verbalizes understanding of instructions and care plan provided today and agrees to view in MyChart. Active MyChart status and patient understanding of how to access instructions and care plan via MyChart confirmed with patient.     The patient has been provided with contact information for the care management team and has been advised to call with any health related questions or concerns.   Please call the care guide team at [phone] if you need to cancel or reschedule your appointment.   Please call the Suicide and Crisis Lifeline: 988 call the [place]  National Suicide Prevention Lifeline: 725-063-9814 or TTY: 442-699-0072 TTY 8542579667) to talk to a trained counselor if you are experiencing a Mental Health or Behavioral Health Crisis or need someone to talk to.  Rama Pilling, RN,BSN,CCM RN Care Manager Transitions of Care  [place]/Population Health  Direct Phone: [phone] Toll Free: [phone] Fax: [phone]

## 2023-01-07 NOTE — Progress Notes (Signed)
[address]       [place] [address]             [phone]  HPI: This is a 63 year old female who is s/p CABG X 2 (LIMA LAD and RSVG PLV ) by Dr. Cliffton Asters on 11/30/2022. She was discharged on 12/04/2022. She presents today for in person post op follow up. She had a virtual visit with Dr. Cliffton Asters on 12/24/2022. She was having no problems at that time. She was seen by Cardiology on 12/20/2022. Her complaints at that time included postoperative insomnia, mild memory issues, and has some postsurgical soreness. She has no specific complaint at this time. She has been walking in the house mostly because of the cold temperatures.  Current Outpatient Medications  Medication Sig Dispense Refill   acetaminophen (TYLENOL) 500 MG tablet Take 1,000 mg by mouth every 6 (six) hours as needed for mild pain, moderate pain, fever or headache.     amLODipine (NORVASC) 5 MG tablet Take 1 tablet (5 mg total) by mouth daily. 30 tablet 1   aspirin 81 MG EC tablet Take 81 mg by mouth daily.     atorvastatin (LIPITOR) 80 MG tablet Take 1 tablet (80 mg total) by mouth daily. Due for physical. 90 tablet 0   calcium carbonate (OSCAL) 1500 (600 Ca) MG TABS tablet Take 1,200 mg of elemental calcium by mouth daily with breakfast.     clopidogrel (PLAVIX) 75 MG tablet Take 1 tablet (75 mg total) by mouth daily. 30 tablet 1   ezetimibe (ZETIA) 10 MG tablet Take 1 tablet (10 mg total) by mouth daily. 90 tablet 3   ferrous sulfate 325 (65 FE) MG tablet Take 325 mg by mouth daily with breakfast.     Fexofenadine HCl (ALLEGRA PO) Take 120 mg by mouth daily.     Insulin Pen Needle 32G X 4 MM MISC Use 4x a day 400 each 3   insulin regular human CONCENTRATED (HUMULIN R U-500 KWIKPEN) 500 UNIT/ML KwikPen Inject 80-100 Units into the skin 3 (three) times daily before meals. (Patient taking differently: Inject 100 Units into the skin 3 (three) times daily before meals.) 18 mL 3   Loratadine (CLARITIN PO)  Take 10 mg by mouth daily.     magnesium oxide (MAG-OX) 400 MG tablet Take 400 mg by mouth daily.     megestrol (MEGACE) 40 MG tablet Take 40 mg by mouth daily. TAKE ONE TABLET BY MOUTH TWO TIMES A DAY FOR 3 DAYS AND THEN 1 TABLET DAILY FOR 15 DAYS     metoprolol tartrate (LOPRESSOR) 50 MG tablet Take 1 tablet (50 mg total) by mouth 2 (two) times daily. 60 tablet 1   Multiple Vitamin (MULTIVITAMIN WITH MINERALS) TABS tablet Take 1 tablet by mouth daily.     oxyCODONE (OXY IR/ROXICODONE) 5 MG immediate release tablet Take 1 tablet (5 mg total) by mouth every 3 (three) hours as needed for severe pain (pain score 7-10). 28 tablet 0   Potassium 99 MG TABS Take 1 tablet (99 mg total) by mouth daily.     tirzepatide (MOUNJARO) 10 MG/0.5ML Pen Inject 10 mg into the skin once a week. (Patient taking differently: Inject 10 mg into the skin once a week. Thursday) 6 mL 3   zinc gluconate 50 MG tablet Take 50 mg by mouth daily.    Vital Signs: Vitals:   01/20/23 1339  BP: (!) 145/72  Pulse: 85  Resp: 20  SpO2: 97%     Physical Exam: CV-RRR Pulmonary-Clear to auscultation bilaterally Abdomen-Soft, non tender, bowel sounds present Extremities-No LE edema Wounds-Clean and dry, no sign of infection  Diagnostic Tests: Narrative & Impression  CLINICAL DATA:  History of CABG   EXAM: CHEST - 2 VIEW   COMPARISON:  12/02/2022   FINDINGS: Heart size within normal limits status post sternotomy and CABG. No focal airspace consolidation, pleural effusion, or pneumothorax.   IMPRESSION: No active cardiopulmonary disease.     Electronically Signed   By: Duanne Guess D.O.   On: 01/20/2023 14:04    Impression and Plan: We reviewed the findings of today's chest x ray. We discussed about restarting Lisinopril/hydrochlorothiazide or start low dose Lisinopril for better BP control. She sees cardiology in the am so she will discuss with provider. Also, she will need refills on a few medications soon  and she will discuss this with cardiology as well. We discussed driving, participation in cardiac rehab (our office will refer her), and continuing sternal precautions until 01/29/2022 (for specifics, please see AVS). We also discussed the importance of keeping her glucose under good control. Her pre op HGA1C was 7.5. She will need to follow up with her medical doctor, which she has an appointment later in the month. She will continue to be followed by cardiology and will see TCTS PRN.  Ardelle Balls, PA-C [facility] [phone]

## 2023-01-11 ENCOUNTER — Ambulatory Visit: Payer: Self-pay

## 2023-01-11 NOTE — Patient Instructions (Signed)
 Visit Information  Thank you for taking time to visit with me today. Please don't hesitate to contact me if I can be of assistance to you.   Following are the goals we discussed today:   Goals Addressed             This Visit's Progress    Recovery from CABG-Getting back to normal       Care Coordination Interventions: Evaluation of current treatment plan related to CABG x2 and patient's adherence to plan as established by provider Reviewed medications with patient and discussed importance of adherence Reviewed scheduled/upcoming provider appointments including follow up with cardiologist Basic overview and discussion of pathophysiology of Heart Failure reviewed Provided education on low sodium diet Discussed Heart Failure and importance of weights Provided education to patient about basic DM disease process Discussed plans with patient for ongoing care management follow up and provided patient with direct contact information for care management team   Patient reports she is doing much better.  Coming along from recent CABG.  Surgical site healing well.  Spouse in the home and supportive.  Follow up appointments scheduled with cardiology.  Discussed cardiac rehab and advantages.  Patient currently on FMLA until February.  She reports diabetes management continues.  Blood sugar last check 120.  She reports weight doing well with most recent weight 222.2 lbs.           Our next appointment is by telephone on 01/31/23 at 130 pm  Please call the care guide team at [phone] if you need to cancel or reschedule your appointment.   If you are experiencing a Mental Health or Behavioral Health Crisis or need someone to talk to, please call the Suicide and Crisis Lifeline: 988   Patient verbalizes understanding of instructions and care plan provided today and agrees to view in MyChart. Active MyChart status and patient understanding of how to access instructions and care plan via MyChart  confirmed with patient.     The patient has been provided with contact information for the care management team and has been advised to call with any health related questions or concerns.   Ardell Makarewicz J Naviah Belfield, RN, MSN RN Care Manager [facility], Population Health Direct Dial : [phone]  Fax: [phone] Website: [email or web address]

## 2023-01-11 NOTE — Patient Outreach (Signed)
  Care Coordination   Initial Visit Note   01/11/2023 Name: Catherine Moon MRN: [id number] DOB: 10-01-1960  Catherine Moon is a 63 y.o. year old female who sees Jordan, Dickey MATSU, MD for primary care. I spoke with  Catherine Moon by phone today.  What matters to the patients health and wellness today?  Recovery from CABG    Goals Addressed             This Visit's Progress    Recovery from CABG-Getting back to normal       Care Coordination Interventions: Evaluation of current treatment plan related to CABG x2 and patient's adherence to plan as established by provider Reviewed medications with patient and discussed importance of adherence Reviewed scheduled/upcoming provider appointments including follow up with cardiologist Basic overview and discussion of pathophysiology of Heart Failure reviewed Provided education on low sodium diet Discussed Heart Failure and importance of weights Provided education to patient about basic DM disease process Discussed plans with patient for ongoing care management follow up and provided patient with direct contact information for care management team   Patient reports she is doing much better.  Coming along from recent CABG.  Surgical site healing well.  Spouse in the home and supportive.  Follow up appointments scheduled with cardiology.  Discussed cardiac rehab and advantages.  Patient currently on FMLA until February.  She reports diabetes management continues.  Blood sugar last check 120.  She reports weight doing well with most recent weight 222.2 lbs.           SDOH assessments and interventions completed:  Yes  SDOH Interventions Today    Flowsheet Row Most Recent Value  SDOH Interventions   Food Insecurity Interventions Intervention Not Indicated  Housing Interventions Intervention Not Indicated  Transportation Interventions Intervention Not Indicated, Patient Resources (Friends/Family)  Utilities  Interventions Intervention Not Indicated  Health Literacy Interventions Intervention Not Indicated        Care Coordination Interventions:  Yes, provided   Follow up plan: Follow up call scheduled for 1/27    Encounter Outcome:  Patient Visit Completed   Yasuo Phimmasone J Kedron Uno, RN, MSN RN Care Manager [facility], Population Health Direct Dial : [phone]  Fax: [phone] Website: [email or web address];

## 2023-01-17 ENCOUNTER — Other Ambulatory Visit: Payer: Self-pay | Admitting: Thoracic Surgery (Cardiothoracic Vascular Surgery)

## 2023-01-17 DIAGNOSIS — Z951 Presence of aortocoronary bypass graft: Secondary | ICD-10-CM

## 2023-01-20 ENCOUNTER — Encounter: Payer: Self-pay | Admitting: Physician Assistant

## 2023-01-20 ENCOUNTER — Ambulatory Visit
Admission: RE | Admit: 2023-01-20 | Discharge: 2023-01-20 | Disposition: A | Discharge status: Home / Self Care | Payer: BC Managed Care – PPO | Source: Ambulatory Visit | Attending: Thoracic Surgery (Cardiothoracic Vascular Surgery) | Admitting: Thoracic Surgery (Cardiothoracic Vascular Surgery)

## 2023-01-20 ENCOUNTER — Ambulatory Visit (INDEPENDENT_AMBULATORY_CARE_PROVIDER_SITE_OTHER): Payer: Self-pay | Admitting: Physician Assistant

## 2023-01-20 VITALS — BP 145/72 | HR 85 | Resp 20 | Ht 66.0 in | Wt 228.0 lb

## 2023-01-20 DIAGNOSIS — Z951 Presence of aortocoronary bypass graft: Secondary | ICD-10-CM

## 2023-01-20 NOTE — Patient Instructions (Signed)
You are encouraged to enroll and participate in the outpatient cardiac rehab program beginning as soon as practical. Make every effort to keep your diabetes under very tight control.  Follow up closely with your primary care physician or endocrinologist and strive to keep their hemoglobin A1c levels as low as possible, preferably near or below 6.0.    2. You may return to driving an automobile as long as you are no longer requiring oral narcotic pain relievers during the daytime.   It would be wise to start driving only short distances during the daylight and gradually increase from there as you feel comfortable. The long term benefits of strict control of diabetes are far reaching and critically important for your overall health and survival.  3. Continue to avoid any heavy lifting or strenuous use of your arms or shoulders for at least a total of two months from the time  of surgery.  After twomonths you may gradually increase how much you lift or otherwise use your arms or chest as tolerated,  with limits based upon whether or not activities lead to the return of significant discomfort.

## 2023-01-20 NOTE — Progress Notes (Signed)
Cardiology Office Note:  .   Date:  01/21/2023  ID:  [name], DOB 03-04-1960, MRN [id number] PCP: [place], Betty G, MD  [facility] Providers Cardiologist: Dr.McAlhany    History of Present Illness: .   Catherine Moon is a 63 y.o. female with hx of NSTEMI and subsequent 2 vessel CABG.  She underwent cardiac cath which revealed a 70% left main stenosis in a left dominant circulation. EF 50-55%   She underwent CABG x 2 utilizing LIMA to LAD and SVG to OM as well as endoscopic harvest of the right greater saphenous vein on 11/26 /2024.  Other history includes hypercholesterolemia, hypertension, type 2 diabetes, and hypertension.  She also has anemia from chronic uterine bleeding and has been placed on Megace by OB/GYN   When last seen she continued to have some mild shortness of breath, with exertion and she is walking 5 minutes 3 times a day to begin to feel more energetic.  She has been out to the grocery store and able to get through without being tired or short of breath or having chest pain.  She is doing more around her house.  She denies any issues with her medications.  She is wanting to move forward with cardiac rehab.  She is not due to return to work till the end of February 2025.  ROS: As above otherwise negative  Studies Reviewed: Marland Kitchen       LEFT HEART CATH AND CORONARY ANGIOGRAPHY 11/26/2022      Prox RCA lesion is 60% stenosed.   Prox RCA to Mid RCA lesion is 90% stenosed.   Prox Cx to Mid Cx lesion is 30% stenosed.   Prox LAD to Mid LAD lesion is 30% stenosed.   Mid LAD lesion is 20% stenosed.   Ost LM lesion is 40% stenosed.   Mid LM to Dist LM lesion is 70% stenosed.   LPAV lesion is 60% stenosed.   LV end diastolic pressure is severely elevated.   The left ventricular ejection fraction is 50-55% by visual estimate.     Physical Exam:   VS:  BP 118/62   Pulse 72   Ht 5\' 6"  (1.676 m)   Wt 226 lb 6.4 oz (102.7 kg)   SpO2 98%   BMI  36.54 kg/m    Wt Readings from Last 3 Encounters:  01/21/23 226 lb 6.4 oz (102.7 kg)  01/20/23 228 lb (103.4 kg)  12/21/22 229 lb 4 oz (104 kg)    GEN: Well nourished, well developed in no acute distress NECK: No JVD; No carotid bruits CARDIAC: RRR, no murmurs, rubs, gallops.  Sternotomy incision is healing well without evidence of evisceration or infection. RESPIRATORY:  Clear to auscultation without rales, wheezing or rhonchi  ABDOMEN: Soft, non-tender, non-distended EXTREMITIES:  No edema; No deformity SVG harvest site proximal right tibial area well-healed without signs of evisceration or infection.  ASSESSMENT AND PLAN: .    Coronary artery disease: History of NSTEMI with subsequent two-vessel CABG on 11/30/2022.  Due to multivessel CAD seen on left heart cath as above.  She is doing well from a cardiac standpoint and is without complaint.  She is beginning to have more energy and is able to go to the grocery store and push a cart without being in any pain.  She continues to be followed by CVTS.  Continue current medication regimen for ongoing secondary management with beta-blocker, Plavix, aspirin, and statin therapy.  2.  Hypertension: Would continue  her on amlodipine 5 mg daily, along with beta-blocker.  She is not on ACE inhibitor currently.  3.  Hypercholesterolemia: In the setting of CABG and diabetes recommend LDL 50 or less.  She is currently on atorvastatin 80 mg daily.  Will need to have follow-up lipid panel along with CMET in 3 months.  4.  Insulin-dependent diabetes: Followed by primary care.  Weight loss, purposeful exercise would be helpful in managing diabetes.    Cardiac Rehabilitation Eligibility Assessment           Signed, Bettey Mare. Liborio Nixon, ANP, AACC

## 2023-01-21 ENCOUNTER — Encounter: Payer: Self-pay | Admitting: Adult Health

## 2023-01-21 ENCOUNTER — Ambulatory Visit: Payer: BC Managed Care – PPO | Attending: Adult Health | Admitting: Adult Health

## 2023-01-21 VITALS — BP 118/62 | HR 72 | Ht 66.0 in | Wt 226.4 lb

## 2023-01-21 DIAGNOSIS — Z951 Presence of aortocoronary bypass graft: Secondary | ICD-10-CM | POA: Diagnosis not present

## 2023-01-21 DIAGNOSIS — Z794 Long term (current) use of insulin: Secondary | ICD-10-CM

## 2023-01-21 DIAGNOSIS — I10 Essential (primary) hypertension: Secondary | ICD-10-CM

## 2023-01-21 DIAGNOSIS — E7800 Pure hypercholesterolemia, unspecified: Secondary | ICD-10-CM | POA: Diagnosis not present

## 2023-01-21 DIAGNOSIS — E113291 Type 2 diabetes mellitus with mild nonproliferative diabetic retinopathy without macular edema, right eye: Secondary | ICD-10-CM

## 2023-01-21 MED ORDER — METOPROLOL TARTRATE 50 MG PO TABS: 50.0000 mg | ORAL_TABLET | Freq: Two times a day (BID) | ORAL | 2 refills | Status: DC

## 2023-01-21 MED ORDER — AMLODIPINE BESYLATE 5 MG PO TABS: 5.0000 mg | ORAL_TABLET | Freq: Every day | ORAL | 3 refills | Status: DC

## 2023-01-21 MED ORDER — CLOPIDOGREL BISULFATE 75 MG PO TABS: 75.0000 mg | ORAL_TABLET | Freq: Every day | ORAL | 3 refills | Status: DC

## 2023-01-21 NOTE — Patient Instructions (Signed)
Medication Instructions:  No changes *If you need a refill on your cardiac medications before your next appointment, please call your pharmacy*   Lab Work: No Labs If you have labs (blood work) drawn today and your tests are completely normal, you will receive your results only by: MyChart Message (if you have MyChart) OR A paper copy in the mail If you have any lab test that is abnormal or we need to change your treatment, we will call you to review the results.   Testing/Procedures: No Testing   Follow-Up: At [facility], you and your health needs are our priority.  As part of our continuing mission to provide you with exceptional heart care, we have created designated Provider Care Teams.  These Care Teams include your primary Cardiologist (physician) and Advanced Practice Providers (APPs -  Physician Assistants and Nurse Practitioners) who all work together to provide you with the care you need, when you need it.  We recommend signing up for the patient portal called "MyChart".  Sign up information is provided on this After Visit Summary.  MyChart is used to connect with patients for Virtual Visits (Telemedicine).  Patients are able to view lab/test results, encounter notes, upcoming appointments, etc.  Non-urgent messages can be sent to your provider as well.   To learn more about what you can do with MyChart, go to [email or web address].    Your next appointment:   3 month(s)  Provider:   Verne Carrow, MD

## 2023-01-27 ENCOUNTER — Telehealth (HOSPITAL_COMMUNITY): Payer: Self-pay

## 2023-01-27 ENCOUNTER — Encounter (HOSPITAL_COMMUNITY): Payer: Self-pay

## 2023-01-27 NOTE — Telephone Encounter (Signed)
Attempted to call patient in regards to Cardiac Rehab - LM on VM Mailed letter 

## 2023-01-28 ENCOUNTER — Encounter: Payer: Self-pay | Admitting: Internal Medicine

## 2023-01-28 ENCOUNTER — Other Ambulatory Visit (HOSPITAL_COMMUNITY): Payer: Self-pay

## 2023-01-28 ENCOUNTER — Ambulatory Visit: Payer: BC Managed Care – PPO | Admitting: Internal Medicine

## 2023-01-28 VITALS — BP 130/70 | HR 86 | Ht 66.0 in | Wt 224.6 lb

## 2023-01-28 DIAGNOSIS — Z794 Long term (current) use of insulin: Secondary | ICD-10-CM

## 2023-01-28 DIAGNOSIS — E113291 Type 2 diabetes mellitus with mild nonproliferative diabetic retinopathy without macular edema, right eye: Secondary | ICD-10-CM

## 2023-01-28 DIAGNOSIS — E785 Hyperlipidemia, unspecified: Secondary | ICD-10-CM | POA: Diagnosis not present

## 2023-01-28 DIAGNOSIS — E1169 Type 2 diabetes mellitus with other specified complication: Secondary | ICD-10-CM | POA: Diagnosis not present

## 2023-01-28 LAB — POCT GLYCOSYLATED HEMOGLOBIN (HGB A1C): Hemoglobin A1C: 5.9 % — AB (ref 4.0–5.6)

## 2023-01-28 MED ORDER — HUMULIN R U-500 KWIKPEN 500 UNIT/ML ~~LOC~~ SOPN: PEN_INJECTOR | SUBCUTANEOUS | Status: DC

## 2023-01-28 MED ORDER — FREESTYLE LIBRE 3 PLUS SENSOR MISC
1.0000 | 3 refills | Status: DC
  Filled 2023-01-28: qty 6, 84d supply, fill #0

## 2023-01-28 NOTE — Progress Notes (Signed)
Patient ID: Catherine Moon, female   DOB: 1960-07-23, 63 y.o.   MRN: [id number]  HPI: Catherine Moon is a 63 y.o.-year-old female, returning for follow-up for DM2, dx in 1997, insulin-dependent since 2010, uncontrolled, with complications (diabetic retinopathy).  She previously saw Dr. Everardo All.  Last visit with me 4 months ago.  Interim history: No increased urination, blurry vision, nausea. She is on Pepcid for gas and reflux - feels better. Since last visit, she had chest pain in 11/2022.  She ended up having CABG 11/30/2022.  She is starting cardiac rehab.  She is off from work for now. She is paying attention to her diet >> she lost 28 pounds since last visit!  Reviewed HbA1c: Lab Results  Component Value Date   HGBA1C 7.5 (H) 11/26/2022   HGBA1C 8.9 (A) 05/04/2022   HGBA1C 9.6 (A) 12/18/2021   HGBA1C 10.0 (A) 09/14/2021   HGBA1C 8.6 (A) 05/18/2021   HGBA1C 9.8 (A) 02/11/2021   HGBA1C 14.6 (A) 12/11/2020   HGBA1C 14.4 (H) 12/10/2020   HGBA1C 6.1 (A) 03/05/2020   HGBA1C 5.8 (A) 10/31/2019  09/14/2022: HbA1c 8.8%  Previously on: - Toujeo 60 units at bedtime (2-3  am) >> 80 >> 90 units daily - Humalog 30-40 units 3x a day before meals - Ozempic 2 mg weekly >> reflux worse  Now on: - Mounjaro 7.5 >> 10 mg weekly - U500: 100 units in am, 50 units before lunch and dinner >>  - 100 units in am/mid-day -- 80-90 units before dinner She was on Metformin >> GI distress.  Pt checks her sugars 2x a day and they are: - am: 240-260 >> 121-315, ave 170-230 >> 165-228, 336 >> 72-210, 244 - 2h after b'fast: n/c - before lunch: n/c >> 140-160 >> n/c  - 2h after lunch: n/c - before dinner: n/c >> 160-230 >> 85, 166-284 >> 61-214, 229 - 2h after dinner: up to 300 >> up to 300 >> 318 >> n/c  - bedtime: n/c  >> 107-168, 242 - nighttime: n/c >> 60-69, 85 Lowest sugar was 121 >> 85 >> 60s; she has hypoglycemia awareness at 70.  Highest sugar was 400 (icecream, no Humalog)  ...>> 336 >> 242.  Glucometer: One Touch ultra  - no CKD, last BUN/creatinine:  Lab Results  Component Value Date   BUN 15 12/21/2022   BUN 16 12/03/2022   CREATININE 0.69 12/21/2022   CREATININE 0.62 12/03/2022   Lab Results  Component Value Date   MICRALBCREAT 4.0 09/14/2022   MICRALBCREAT 6.9 12/10/2020   MICRALBCREAT 1.4 05/22/2019   MICRALBCREAT 2.0 01/15/2016   MICRALBCREAT 0.9 11/01/2014   MICRALBCREAT 0.7 03/14/2013   MICRALBCREAT 0.6 09/20/2011   MICRALBCREAT 2.1 11/18/2009   MICRALBCREAT 6.9 11/05/2005  On lisinopril 20 mg daily.  -+ Hyperlipidemia including hypertriglyceridemia; last set of lipids: Lab Results  Component Value Date   CHOL 102 11/26/2022   HDL 36 (L) 11/26/2022   LDLCALC 42 11/26/2022   LDLDIRECT 137.0 12/10/2020   TRIG 122 11/26/2022   CHOLHDL 2.8 11/26/2022  On Lipitor 80 mg daily.  - last eye exam was on  07/23/2022. 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 Dr. Shea Evans.   - no numbness and tingling in her feet.  Last foot exam 09/14/2022.  On ASA 81.  She is staying up late and wakes up late.  She works from home.  ROS: + see HPI  Past Medical History:  Diagnosis Date   CTS (carpal tunnel syndrome)  DM (diabetes mellitus) (HCC)    Dyslipidemia    Heart murmur    had ECHO 01-2021   HTN (hypertension)    Hypercholesterolemia    Seasonal allergies    Smoker    Warts, genital    Past Surgical History:  Procedure Laterality Date   COLONOSCOPY     CORONARY ARTERY BYPASS GRAFT N/A 11/30/2022   Procedure: CORONARY ARTERY BYPASS GRAFTING X 2 , USING LEFT INTERNAL MAMMARY ARTERY AND ENDOSCOPICALLY HARVESTED RIGHT SAPHENOUS VEIN GRAFT;  Surgeon: [name], MD;  Location: [facility] OR;  Service: Open Heart Surgery;  Laterality: N/A;   CYST EXCISION Right 03/26/2021   Procedure: RIGHT AXILLARY SEBACEOUS CYST EXCISION;  Surgeon: Emelia Loron, MD;  Location: [facility];  Service: General;  Laterality: Right;  LOCAL    DILATION AND CURETTAGE OF UTERUS     LEFT HEART CATH AND CORONARY ANGIOGRAPHY N/A 11/26/2022   Procedure: LEFT HEART CATH AND CORONARY ANGIOGRAPHY;  Surgeon: Iran Ouch, MD;  Location: [facility] INVASIVE CV LAB;  Service: Cardiovascular;  Laterality: N/A;   TEE WITHOUT CARDIOVERSION N/A 11/30/2022   Procedure: TRANSESOPHAGEAL ECHOCARDIOGRAM (TEE);  Surgeon: [name], MD;  Location: [facility] OR;  Service: Open Heart Surgery;  Laterality: N/A;   WISDOM TOOTH EXTRACTION     Social History   Socioeconomic History   Marital status: Married    Spouse name: Not on file   Number of children: Not on file   Years of education: Not on file   Highest education level: Associate degree: academic program  Occupational History   Not on file  Tobacco Use   Smoking status: Former    Current packs/day: 0.00    Average packs/day: 1 pack/day for 26.0 years (26.0 ttl pk-yrs)    Types: Cigarettes    Start date: 08/12/1981    Quit date: 08/13/2007    Years since quitting: 15.4   Smokeless tobacco: Never   Tobacco comments:     She smoked one pack per day for 26 years but quit in 2009.  Vaping Use   Vaping status: Never Used  Substance and Sexual Activity   Alcohol use: Yes    Comment: social   Drug use: No   Sexual activity: Not on file  Other Topics Concern   Not on file  Social History Narrative   Works 3rd shift in [employer], married.  She smoked one pack per day for 26 years but quit in 2009.         Social Drivers of Corporate investment banker Strain: Low Risk  (11/25/2022)   Overall Financial Resource Strain (CARDIA)    Difficulty of Paying Living Expenses: Not hard at all  Food Insecurity: No Food Insecurity (01/11/2023)   Hunger Vital Sign    Worried About Running Out of Food in the Last Year: Never true    Ran Out of Food in the Last Year: Never true  Transportation Needs: No Transportation Needs (01/11/2023)   PRAPARE - Administrator, Civil Service  (Medical): No    Lack of Transportation (Non-Medical): No  Physical Activity: Unknown (11/25/2022)   Exercise Vital Sign    Days of Exercise per Week: Patient declined    Minutes of Exercise per Session: Not on file  Stress: No Stress Concern Present (11/25/2022)   [employer] of Occupational Health - Occupational Stress Questionnaire    Feeling of Stress : Only a little  Social Connections: Socially Isolated (11/25/2022)  Social Advertising account executive [NHANES]    Frequency of Communication with Friends and Family: Once a week    Frequency of Social Gatherings with Friends and Family: Never    Attends Religious Services: Never    Database administrator or Organizations: No    Attends Engineer, structural: Not on file    Marital Status: Married  Catering manager Violence: Not At Risk (01/11/2023)   Humiliation, Afraid, Rape, and Kick questionnaire    Fear of Current or Ex-Partner: No    Emotionally Abused: No    Physically Abused: No    Sexually Abused: No   Current Outpatient Medications on File Prior to Visit  Medication Sig Dispense Refill   acetaminophen (TYLENOL) 500 MG tablet Take 1,000 mg by mouth every 6 (six) hours as needed for mild pain, moderate pain, fever or headache.     amLODipine (NORVASC) 5 MG tablet Take 1 tablet (5 mg total) by mouth daily. 90 tablet 3   aspirin 81 MG EC tablet Take 81 mg by mouth daily.     atorvastatin (LIPITOR) 80 MG tablet Take 1 tablet (80 mg total) by mouth daily. Due for physical. 90 tablet 0   calcium carbonate (OSCAL) 1500 (600 Ca) MG TABS tablet Take 1,200 mg of elemental calcium by mouth daily with breakfast.     clopidogrel (PLAVIX) 75 MG tablet Take 1 tablet (75 mg total) by mouth daily. 90 tablet 3   ezetimibe (ZETIA) 10 MG tablet Take 1 tablet (10 mg total) by mouth daily. 90 tablet 3   famotidine (PEPCID) 10 MG tablet Take 10 mg by mouth 2 (two) times daily.     ferrous sulfate 325 (65 FE) MG tablet Take 325  mg by mouth daily with breakfast.     Fexofenadine HCl (ALLEGRA PO) Take 120 mg by mouth daily.     Insulin Pen Needle 32G X 4 MM MISC Use 4x a day 400 each 3   insulin regular human CONCENTRATED (HUMULIN R U-500 KWIKPEN) 500 UNIT/ML KwikPen Inject 80-100 Units into the skin 3 (three) times daily before meals. (Patient taking differently: Inject 100 Units into the skin 3 (three) times daily before meals.) 18 mL 3   Loratadine (CLARITIN PO) Take 10 mg by mouth daily.     magnesium oxide (MAG-OX) 400 MG tablet Take 400 mg by mouth daily.     megestrol (MEGACE) 40 MG tablet Take 40 mg by mouth daily. TAKE ONE TABLET BY MOUTH TWO TIMES A DAY FOR 3 DAYS AND THEN 1 TABLET DAILY FOR 15 DAYS     metoprolol tartrate (LOPRESSOR) 50 MG tablet Take 1 tablet (50 mg total) by mouth 2 (two) times daily. 180 tablet 2   Multiple Vitamin (MULTIVITAMIN WITH MINERALS) TABS tablet Take 1 tablet by mouth daily.     oxyCODONE (OXY IR/ROXICODONE) 5 MG immediate release tablet Take 1 tablet (5 mg total) by mouth every 3 (three) hours as needed for severe pain (pain score 7-10). (Patient not taking: Reported on 01/21/2023) 28 tablet 0   Potassium 99 MG TABS Take 1 tablet (99 mg total) by mouth daily.     tirzepatide (MOUNJARO) 10 MG/0.5ML Pen Inject 10 mg into the skin once a week. (Patient taking differently: Inject 10 mg into the skin once a week. Thursday) 6 mL 3   zinc gluconate 50 MG tablet Take 50 mg by mouth daily.     No current facility-administered medications on file prior to visit.  No Known Allergies Family History  Problem Relation Age of Onset   Breast cancer Mother    Heart disease Mother    Cancer Mother        Breast   Coronary artery disease Father        starting in his 39s. A bypass and mitral infarction   Heart disease Father        Coronary Disease, bypass and mitral infarction   Colon cancer Paternal Grandmother    PE: BP 130/70   Pulse 86   Ht 5\' 6"  (1.676 m)   Wt 224 lb 9.6 oz (101.9  kg)   SpO2 98%   BMI 36.25 kg/m  Wt Readings from Last 10 Encounters:  01/28/23 224 lb 9.6 oz (101.9 kg)  01/21/23 226 lb 6.4 oz (102.7 kg)  01/20/23 228 lb (103.4 kg)  12/21/22 229 lb 4 oz (104 kg)  12/20/22 228 lb 3.2 oz (103.5 kg)  12/04/22 232 lb (105.2 kg)  09/14/22 252 lb (114.3 kg)  05/04/22 251 lb 12.8 oz (114.2 kg)  12/18/21 242 lb 6.4 oz (110 kg)  09/14/21 237 lb 6.4 oz (107.7 kg)   Constitutional: overweight, in NAD Eyes: no exophthalmos ENT: no masses palpated in neck, no cervical lymphadenopathy Cardiovascular: RRR, No MRG, + mild pitting edema LE edema Respiratory: CTA B Musculoskeletal: no deformities Skin:no rashes Neurological: no tremor with outstretched hands  ASSESSMENT: 1. DM2, insulin-dependent, uncontrolled, with complications - mild NPDR OD, without macular edema  2. HL  PLAN:  1. Patient with longstanding uncontrolled, type 2 diabetes, very insulin resistant, on U-500 insulin and GLP-1/GIP receptor agonist.  We increased the dose of her Mounjaro at last visit.  At that time, she still did not have the sensor and I again advised her to do it.  Sugars were fluctuating and it was difficult to discern trends.  We did increase the Mounjaro from 7.5 to 10 mg daily at that time.  After this change, HbA1c improved from 8.8% to 7.5% 2 months ago. -She is not on the sensor at today's visit.  She tells me she was not called about this from the pharmacy.  I would really recommend for her and I advised her to check with the pharmacy herself to see if she can get it.  I called in the freestyle libre 3+ CGM to [employer].  She would definitely qualify for this since she is on multiple insulin doses a day. -At today's visit, after her CABG, her sugars improved significantly.  I suspect that improving her heart condition help significantly but she is also improving her diet.  She is also preparing to start cardiac rehab.  She lost a significant amount of weight  since last visit (28 pounds).  Sugars are much better and she is actually dropping her sugars at night.  She has to snack at night to keep the sugars up.  She did not decrease the doses of U-500 insulin but I advised her to do this now.  I advised her to let me know if she has more lows.  Will continue the same dose of Mounjaro for now. - I suggested to:  Patient Instructions  Please continue: - Mounjaro 10 mg weekly  Decrease: - U500: - 80 units in am  - 50-60 units before dinner  Try to get the Freestyle libre 3+ CGM.  Please return in 3 months with your sugar log.   - we checked her HbA1c: 5.9% (impressively improved) -  advised to check sugars at different times of the day - 4x a day, rotating check times - advised for yearly eye exams >> she is UTD - return to clinic in 3-4 months  2. HL -Lipid panel was reviewed from 11/2022: Fractions at goal with exception of a low HDL: Lab Results  Component Value Date   CHOL 102 11/26/2022   HDL 36 (L) 11/26/2022   LDLCALC 42 11/26/2022   LDLDIRECT 137.0 12/10/2020   TRIG 122 11/26/2022   CHOLHDL 2.8 11/26/2022  -She continues on Lipitor 80 mg daily without side effects  Carlus Pavlov, MD PhD [facility] Endocrinology

## 2023-01-28 NOTE — Patient Instructions (Addendum)
Please continue: - Mounjaro 10 mg weekly  Decrease: - U500: - 80 units in am  - 50-60 units before dinner  Try to get the Freestyle libre 3+ CGM.  Please return in 3 months with your sugar log.

## 2023-01-31 ENCOUNTER — Ambulatory Visit: Payer: Self-pay

## 2023-01-31 NOTE — Patient Outreach (Signed)
  Care Coordination   Follow Up Visit Note   01/31/2023 Name: Catherine Moon MRN: [id number] DOB: July 16, 1960  Catherine Moon is a 63 y.o. year old female who sees [place], Timoteo Expose, MD for primary care. I spoke with  [name] by phone today.  What matters to the patients health and wellness today?  Recovering from CABG    Goals Addressed             This Visit's Progress    Recovery from CABG-Getting back to normal       Care Coordination Interventions: Evaluation of current treatment plan related to CABG x2 and patient's adherence to plan as established by provider Reviewed medications with patient and discussed importance of adherence Reviewed scheduled/upcoming provider appointments including follow up with cardiologist Basic overview and discussion of pathophysiology of Heart Failure reviewed Provided education on low sodium diet Discussed Heart Failure and importance of weights Provided education to patient about basic DM disease process Discussed plans with patient for ongoing care management follow up and provided patient with direct contact information for care management team   Patient reports she is doing good.  Spouse has gone back to work.  Discussed diet options and stressed limiting salt seasonings.  Discussed cardiac rehab and  going back to work.  Patient currently on FMLA until February.  She reports diabetes management continues.  A1c 5.9.  Weight 220 lbs.  No concerns.           SDOH assessments and interventions completed:  Yes     Care Coordination Interventions:  Yes, provided   Follow up plan: Follow up call scheduled for February    Encounter Outcome:  Patient Visit Completed   Bary Leriche RN, MSN [facility], [facility] RN Care Manager Direct Dial: [phone]  Fax: [phone] Website: [email or web address]

## 2023-01-31 NOTE — Patient Instructions (Signed)
Visit Information  Thank you for taking time to visit with me today. Please don't hesitate to contact me if I can be of assistance to you.   Following are the goals we discussed today:   Goals Addressed             This Visit's Progress    Recovery from CABG-Getting back to normal       Care Coordination Interventions: Evaluation of current treatment plan related to CABG x2 and patient's adherence to plan as established by provider Reviewed medications with patient and discussed importance of adherence Reviewed scheduled/upcoming provider appointments including follow up with cardiologist Basic overview and discussion of pathophysiology of Heart Failure reviewed Provided education on low sodium diet Discussed Heart Failure and importance of weights Provided education to patient about basic DM disease process Discussed plans with patient for ongoing care management follow up and provided patient with direct contact information for care management team   Patient reports she is doing good.  Spouse has gone back to work.  Discussed diet options and stressed limiting salt seasonings.  Discussed cardiac rehab and  going back to work.  Patient currently on FMLA until February.  She reports diabetes management continues.  A1c 5.9.  Weight 220 lbs.  No concerns.           Our next appointment is by telephone on 02/21/23 at 130 pm  Please call the care guide team at [phone] if you need to cancel or reschedule your appointment.   If you are experiencing a Mental Health or Behavioral Health Crisis or need someone to talk to, please call the Suicide and Crisis Lifeline: 988   Patient verbalizes understanding of instructions and care plan provided today and agrees to view in MyChart. Active MyChart status and patient understanding of how to access instructions and care plan via MyChart confirmed with patient.     The patient has been provided with contact information for the care  management team and has been advised to call with any health related questions or concerns.   Bary Leriche RN, MSN [facility], [facility] RN Care Manager Direct Dial: [phone]  Fax: [phone] Website: [email or web address]

## 2023-02-06 ENCOUNTER — Other Ambulatory Visit: Payer: Self-pay | Admitting: Family Medicine

## 2023-02-11 ENCOUNTER — Other Ambulatory Visit: Payer: Self-pay

## 2023-02-17 ENCOUNTER — Telehealth: Payer: Self-pay | Admitting: *Deleted

## 2023-02-17 NOTE — Telephone Encounter (Signed)
   Pre-operative Risk Assessment    Patient Name: Catherine Moon  DOB: 1960/08/07 MRN: [id number]   Date of last office visit: 01/21/23 Joni Reining, DNP Date of next office visit: 05/19/23 DR. McALHANY   Request for Surgical Clearance    Procedure:   D&C HYSTEROSCOPY   Date of Surgery:  Clearance TBD                                Surgeon:  DR. MARIA D' IORIO Surgeon's Group or Practice Name:  [facility] Phone number:  [phone] Fax number:  [phone] ATTN: SHANLLE   Type of Clearance Requested:   - Medical  - Pharmacy:  Hold Aspirin and Clopidogrel (Plavix)     Type of Anesthesia:   CHOICE   Additional requests/questions:    Elpidio Anis   02/17/2023, 5:52 PM

## 2023-02-18 NOTE — Telephone Encounter (Signed)
   Patient Name: Catherine Moon  DOB: 1960-03-07 MRN: [id number]  Primary Cardiologist: None  Chart reviewed as part of pre-operative protocol coverage. Given past medical history and time since last visit, based on ACC/AHA guidelines, Kenedee Molesky is at acceptable risk for the planned procedure without further cardiovascular testing.   The patient was advised that if she develops new symptoms prior to surgery to contact our office to arrange for a follow-up visit, and she verbalized understanding.  Per protocol patient can hold Plavix 5 days prior to procedure and should continue ASA 81 mg if possible through the perioperative period.  However if bleeding risk is elevated can hold 7 days prior to procedure.  I will route this recommendation to the requesting party via Epic fax function and remove from pre-op pool.  Please call with questions.  Napoleon Form, Leodis Rains, NP 02/18/2023, 9:44 AM

## 2023-02-18 NOTE — Telephone Encounter (Signed)
Good Morning Catherine Moon  We have received a surgical clearance request for [name] for upcoming D&C with hysterectomy. They were seen recently in clinic on 01/21/2023. Can you please comment on surgical and if you feel they are at acceptable risk to proceed with scheduled procedure.  Please forward you guidance and recommendations to P CV DIV PREOP   Thanks, Robin Searing, NP

## 2023-02-19 ENCOUNTER — Other Ambulatory Visit (HOSPITAL_COMMUNITY): Payer: Self-pay

## 2023-02-21 ENCOUNTER — Other Ambulatory Visit (HOSPITAL_COMMUNITY): Payer: Self-pay

## 2023-02-21 ENCOUNTER — Encounter: Payer: Self-pay | Admitting: Cardiovascular Disease

## 2023-02-21 ENCOUNTER — Ambulatory Visit: Payer: Self-pay

## 2023-02-21 NOTE — Patient Outreach (Signed)
  Care Coordination   02/21/2023 Name: Catherine Moon MRN: [id number] DOB: Apr 25, 1960   Care Coordination Outreach Attempts:  An unsuccessful outreach was attempted for an appointment today.  Follow Up Plan:  Additional outreach attempts will be made to offer the patient complex care management information and services.   Encounter Outcome:  No Answer   Care Coordination Interventions:  No, not indicated    Bary Leriche RN, MSN [facility]  [facility], [facility] RN Care Manager Direct Dial: [phone]  Fax: [phone] Website: [email or web address]

## 2023-02-22 ENCOUNTER — Encounter: Payer: Self-pay | Admitting: Adult Health

## 2023-02-24 ENCOUNTER — Other Ambulatory Visit: Payer: Self-pay | Admitting: Internal Medicine

## 2023-02-24 DIAGNOSIS — E113291 Type 2 diabetes mellitus with mild nonproliferative diabetic retinopathy without macular edema, right eye: Secondary | ICD-10-CM

## 2023-02-28 ENCOUNTER — Other Ambulatory Visit (HOSPITAL_COMMUNITY): Payer: Self-pay

## 2023-02-28 MED ORDER — HUMULIN R U-500 KWIKPEN 500 UNIT/ML ~~LOC~~ SOPN
PEN_INJECTOR | SUBCUTANEOUS | 2 refills | Status: DC
  Filled 2023-02-28: qty 36, 90d supply, fill #0
  Filled 2023-05-28: qty 36, 90d supply, fill #1
  Filled 2023-09-01: qty 36, 90d supply, fill #2

## 2023-03-01 ENCOUNTER — Other Ambulatory Visit (HOSPITAL_COMMUNITY): Payer: Self-pay

## 2023-03-01 ENCOUNTER — Telehealth (HOSPITAL_COMMUNITY): Payer: Self-pay

## 2023-03-01 ENCOUNTER — Telehealth: Payer: Self-pay | Admitting: Pharmacy Technician

## 2023-03-01 NOTE — Telephone Encounter (Signed)
 No response from in regards to Cardiac Rehab  Closed referral

## 2023-03-01 NOTE — Telephone Encounter (Signed)
 Pharmacy Patient Advocate Encounter   Received notification from CoverMyMeds that prior authorization for [facility] 3 plus is required/requested.   Insurance verification completed.   The patient is insured through  [employer]  .   Per test claim: PA required; PA submitted to above mentioned insurance via Fax Key/confirmation #/EOC [id number] [phone] Status is pending

## 2023-03-03 ENCOUNTER — Other Ambulatory Visit: Payer: Self-pay | Admitting: Obstetrics

## 2023-03-03 ENCOUNTER — Telehealth: Payer: Self-pay

## 2023-03-03 NOTE — Patient Outreach (Signed)
 Care Coordination   03/03/2023 Name: Catherine Moon MRN: [id number] DOB: 27-Nov-1960   Care Coordination Outreach Attempts:  A second unsuccessful outreach was attempted today to offer the patient with information about available complex care management services.  Follow Up Plan:  Additional outreach attempts will be made to offer the patient complex care management information and services.   Encounter Outcome:  No Answer   Care Coordination Interventions:  No, not indicated    Bary Leriche RN, MSN [facility]  [facility], [facility] RN Care Manager Direct Dial: [phone]  Fax: [phone] Website: [email or web address]

## 2023-03-08 ENCOUNTER — Telehealth: Payer: Self-pay

## 2023-03-08 NOTE — Patient Outreach (Signed)
 Care Coordination   03/08/2023 Name: Catherine Moon MRN: [id number] DOB: 10-31-1960   Care Coordination Outreach Attempts:  A third unsuccessful outreach was attempted today to offer the patient with information about available complex care management services.  Follow Up Plan:  No further outreach attempts will be made at this time. We have been unable to contact the patient to offer or enroll patient in complex care management services.  Encounter Outcome:  No Answer   Care Coordination Interventions:  No, not indicated     Bary Leriche RN, MSN [facility]  [facility], [facility] RN Care Manager Direct Dial: [phone]  Fax: [phone] Website: [email or web address]

## 2023-03-10 ENCOUNTER — Other Ambulatory Visit (HOSPITAL_COMMUNITY): Payer: Self-pay

## 2023-03-11 NOTE — Telephone Encounter (Signed)
 Pharmacy Patient Advocate Encounter  Received notification from  [employer]  that Prior Authorization for [facility] 3 plus has been DENIED.  Full denial letter will be uploaded to the media tab. See denial reason below.  Preferred alternatives include: DEXCOM G7 SENSOR or FREESTYLE LIBRE 2 PLUS/SENSOR. These will be reviewed upon request.   PA #/Case ID/Reference #: Carver Fila

## 2023-03-15 ENCOUNTER — Encounter (HOSPITAL_COMMUNITY): Payer: Self-pay | Admitting: Obstetrics

## 2023-03-15 ENCOUNTER — Encounter: Payer: Self-pay | Admitting: Internal Medicine

## 2023-03-15 NOTE — Progress Notes (Addendum)
 Spoke w/ via phone for pre-op interview--- pt Lab needs dos----    bmp     Lab results------  current EKG in epic/ chart COVID test -----patient states asymptomatic no test needed Arrive at -------  0530 on 03-17-2023 NPO after MN NO Solid Food.  Clear liquids from MN until--- 0430 Pre-Surgery Ensure or G2:  n/a ERAS protocol  Med rec completed Medications to take morning of surgery ----- none Diabetic medication ----- pt verbalized understanding per [facility] diabetic guidelines , she does humulin R U-500 she is to call her endocrinologist, Dr Elvera Lennox, regarding doseage day before surgery and morning of surgery.  Especially since patient does her insulin at different times of day due to her work schedule.  GLP1 agonist last dose:  Thursday 03-10-2023 GLP1 instructions:  pt stated instructions were not given by surgeon or office.  Pt verbalized understanding of anesthesia guideline.  Patient instructed no nail polish to be worn day of surgery Patient instructed to bring photo id and insurance card day of surgery Patient aware to have Driver (ride ) / caregiver    for 24 hours after surgery - husband, henry Patient Special Instructions ----- n/a Pre-Op special Instructions ----- last dose plavix 03-11-2023/  pt restarted asa as of today since cardiology wanted her not to stop  Patient verbalized understanding of instructions that were given at this phone interview. Patient denies chest pain, sob, fever, cough at the interview.    Anesthesia Review:   CAD/ NSTEMI 11-26-2022 cath severe 2V CAD s/p CABG x2 on 11-30-2022;  HTN;  HFpEF;  mild AV stenosis per intra-op TEE valve area 1.46 cm^2;  IDDM2 Pt denies cardiac s&s, sob, and no peripheral swelling.  Pt has telephone cardiac clearance by Robin Searing NP on 02-18-2023 in epic/ chart.  PCP:  Dr Leonard Schwartz. [place] (lov 12-21-2022) Cardiologist : Dr Clifton James [facility] 01-21-2023) Endocrinologist:  Dr Elvera Lennox [facility] 01-28-2023)   Chest x-ray :   01-20-2023 EKG :  12-10-2022 Echo :  TEE 11-30-2022/  TTE 11-26-2022 Stress test:  no Cardiac Cath :  11-26-2022  Activity level:   denies sob w/ any activity Sleep Study/ CPAP :  no Fasting Blood Sugar :  90--200    / Checks Blood Sugar -- times a day:  2-3 times daily  Blood Thinner/ Instructions Maurice Small Dose:  plavix ASA / Instructions/ Last Dose : ASA 81mg  Pt stated was given instructions from Dr Julien Girt office to stop plavix 5 days prior to surgery and received written instructions by mail from office to stop ASA 2 weeks prior to surgery however when received was less then 2 weeks so last dose 03-09-2023.  But per cardiology c;earance she was to continue ASA and not stop,  pt stated she not aware of cardiology instructions so stated she will restart ASA as of today.  But did stated last dose plavix 03-11-2023.

## 2023-03-16 NOTE — H&P (Signed)
 Surgical H&P   S: Patient is a 63 yo G0 female with history of HLD, HTN, T2DM presenting for follow-up of PMB. Patient had cardiac bypass on 11/30/2022. Recovering well but has delayed GYN work-up. Reports that bleeding has been off and on for last few months.   Patient followed [facility] for many years, had to switch due to insurance issues. Reports that she has had multiple episodes of postmenopausal bleeding. A few years ago underwent normal EMB and bleeding resolved. Started bleeding again this past September and has continued to have bleeding off and on. Is currently bleeding, reports heavy like a period with clots. Otherwise denies pelvic pain. EMB obtained at [facility] last month, benign endometrium. Plan at that time for Hsc, D&C to better evaluate causes, however patient had to switch providers due to insurance issues.   Patient evaluated by cardiology pre-op: "Chart reviewed as part of pre-operative protocol coverage. Given past medical history and time since last visit, based on ACC/AHA guidelines, Catherine Moon is at acceptable risk for the planned procedure without further cardiovascular testing."   Past Medical History:  Diagnosis Date   (HFpEF) heart failure with preserved ejection fraction (HCC)    Anticoagulated    plavix and asa 81mg   ---- managed by cardiology   Aortic stenosis due to bicuspid aortic valve    per intraoperative TEE 11-30-2023  aortic bicuspid valve w/ mild stenosis,  area 1.46   Coronary artery disease 11/2022   cardiologist--- dr Clifton James;   NSTEMI 11-26-2022  cath w/ severe 2 vessel CAD ;   11-30-2022  s/p CABG x2   CTS (carpal tunnel syndrome)    Diverticulosis of colon    GERD (gastroesophageal reflux disease)    Heart murmur    had ECHO 01-2021   Hemorrhoids    interenal and external   History of adenomatous polyp of colon    History of non-ST elevation myocardial infarction (NSTEMI)    HTN (hypertension)    Hyperlipidemia,  mixed    IDA (iron deficiency anemia)    Insulin dependent type 2 diabetes mellitus [facility]) 1997   endocrinologist--- dr Elvera Lennox;   dx 1997 DM2;  started insulin in 2010   Mild nonproliferative diabetic retinopathy without macular edema associated with type 2 diabetes mellitus (HCC)    followed gy dr Shea Evans;   both eyes   PMB (postmenopausal bleeding)    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   LIMA--LAD;   RSVG--PLV  by dr lightfoot   Seasonal allergies    Thickened endometrium    Warts, genital    Wears glasses    Past Surgical History:  Procedure Laterality Date   COLONOSCOPY WITH PROPOFOL  02/17/2015   dr Mortimer Fries   CORONARY ARTERY BYPASS GRAFT N/A 11/30/2022   Procedure: CORONARY ARTERY BYPASS GRAFTING X 2 , USING LEFT INTERNAL MAMMARY ARTERY AND ENDOSCOPICALLY HARVESTED RIGHT SAPHENOUS VEIN GRAFT;  Surgeon: [name], MD;  Location: [facility] OR;  Service: Open Heart Surgery;  Laterality: N/A;   CYST EXCISION Right 03/26/2021   Procedure: RIGHT AXILLARY SEBACEOUS CYST EXCISION;  Surgeon: Emelia Loron, MD;  Location: [facility];  Service: General;  Laterality: Right;  LOCAL   DILATION AND CURETTAGE OF UTERUS  1977   LEFT HEART CATH AND CORONARY ANGIOGRAPHY N/A 11/26/2022   Procedure: LEFT HEART CATH AND CORONARY ANGIOGRAPHY;  Surgeon: Iran Ouch, MD;  Location: [facility] INVASIVE CV LAB;  Service: Cardiovascular;  Laterality: N/A;   TEE WITHOUT  CARDIOVERSION N/A 11/30/2022   Procedure: TRANSESOPHAGEAL ECHOCARDIOGRAM (TEE);  Surgeon: [name], MD;  Location: [facility] OR;  Service: Open Heart Surgery;  Laterality: N/A;   WISDOM TOOTH EXTRACTION     Current Outpatient Medications  Medication Instructions   acetaminophen (TYLENOL) 1,000 mg, Every 6 hours PRN   amLODipine (NORVASC) 5 mg, Oral, Daily   aspirin EC 81 mg, Oral, Daily after lunch,     atorvastatin (LIPITOR) 80 mg, Oral, Daily, Due for physical.   Black Elderberry 1,000 mg, Oral, Daily after lunch    calcium carbonate (OSCAL) 1500 (600 Ca) MG TABS tablet 1,200 mg of elemental calcium, Oral, Daily after lunch   cholecalciferol (VITAMIN D3) 1,000 Units, Oral, Daily after lunch   clopidogrel (PLAVIX) 75 mg, Oral, Daily   Continuous Glucose Sensor (FREESTYLE LIBRE 3 PLUS SENSOR) MISC USe as directed every 14 (fourteen) days.   Cranberry 8,400 mg, Oral, Daily after lunch   ezetimibe (ZETIA) 10 mg, Oral, Daily   famotidine (PEPCID) 10 mg, Oral, Daily after lunch   ferrous sulfate 325 mg, Oral, Daily after lunch   fexofenadine (ALLEGRA) 180 mg, Oral, Daily after lunch   Ginger Root 550 mg, Oral, Daily after lunch   Green Tea 3,015 mg, Oral, Daily after lunch   ibuprofen (ADVIL) 400 mg, Every 6 hours PRN   Insulin Pen Needle 32G X 4 MM MISC Use 4x a day   insulin regular human CONCENTRATED (HUMULIN R U-500 KWIKPEN) 500 UNIT/ML KwikPen Inject under skin up to 200 units insulin a day as advised   loratadine (CLARITIN) 10 mg, Oral, Daily after lunch   magnesium oxide (MAG-OX) 400 mg, Oral, Daily after lunch   megestrol (MEGACE) 40 mg, Daily   metoprolol tartrate (LOPRESSOR) 50 mg, Oral, 2 times daily   Mounjaro 10 mg, Subcutaneous, Weekly   Multiple Vitamin (MULTIVITAMIN WITH MINERALS) TABS tablet 1 tablet, Oral, Daily after lunch   Multiple Vitamins-Minerals (HAIR SKIN & NAILS) TABS 1 tablet, Oral, Daily after lunch   Multiple Vitamins-Minerals (PRESERVISION AREDS PO) 1 capsule, Daily after lunch   oxyCODONE (OXY IR/ROXICODONE) 5 mg, Oral, Every  3 hours PRN   Potassium 99 mg, Oral, Daily   sodium chloride (OCEAN) 0.65 % SOLN nasal spray 1 spray, Daily PRN   zinc gluconate 50 mg, Oral, Daily after lunch    No Known Allergies   O:   PE GA: well appearing, NAD Chest: normal work of breathing on room air Abd: soft, NTND Ext: no TTP  Labs: EMB (10/27/22): superficial inactive pattern endometrium w/breakdown, no evidence of unopposed estrogen effect (hyperplasia) or malignancy CA125:  12.0 CEA: 0.7 CA 19-9: 18  Imaging: U/S (02/09/23): Thickened endometrium with cystic changes and active bleeding noted. EE 11.39mm. Small calcified fibroids <1cm. Ovaries are bilaterally abnormal with heterogenous appearance and small follicular appearing cysts throughout. Bilaterally enlarged (~19-20cc). Plan for hysteroscopy for endometrial sampling.    A/P: 62yo G0 w/HLD, HTN, T2DM, cardiac bypass and PMB p/f endometrial sampling. S/p normal EMB at outside GYN. Ultrasound with thickened, vascular endometrium. Pt with bilateral ovaries with small cysts. Pt reports h/o PCOS versus CAH (pt unsure which, has not taken meds in many years, previously followed by endocrinologist). Discussed recommendation for directed endometrial sampling with hysteroscopy, D&C. Risks of surgery including pain, bleeding, and damage to surrounding organs reviewed.   Per cardiology, Plavix held 5 days prior to procedure, ASA 81mg  continued. Plan to re-start Plavix post-op day #1.   Plan to proceed to OR for  hysteroscopy, D&C.   Marlene Bast, MD

## 2023-03-16 NOTE — Anesthesia Preprocedure Evaluation (Signed)
 Anesthesia Evaluation  Patient identified by MRN, date of birth, ID band Patient awake    Reviewed: Allergy & Precautions, H&P , NPO status , Patient's Chart, lab work & pertinent test results  Airway Mallampati: II  TM Distance: >3 FB Neck ROM: Full    Dental no notable dental hx.    Pulmonary neg pulmonary ROS, former smoker   Pulmonary exam normal breath sounds clear to auscultation       Cardiovascular hypertension, + CAD, + Past MI and + CABG  Normal cardiovascular exam Rhythm:Regular Rate:Normal     Neuro/Psych negative neurological ROS  negative psych ROS   GI/Hepatic Neg liver ROS,GERD  ,,  Endo/Other  negative endocrine ROSdiabetes    Renal/GU negative Renal ROS  negative genitourinary   Musculoskeletal negative musculoskeletal ROS (+)    Abdominal   Peds negative pediatric ROS (+)  Hematology  (+) Blood dyscrasia, anemia   Anesthesia Other Findings   Reproductive/Obstetrics negative OB ROS                              Anesthesia Physical Anesthesia Plan  ASA: 3  Anesthesia Plan: General   Post-op Pain Management:    Induction: Intravenous  PONV Risk Score and Plan: 3 and Ondansetron, Dexamethasone and Treatment may vary due to age or medical condition  Airway Management Planned: LMA  Additional Equipment:   Intra-op Plan:   Post-operative Plan: Extubation in OR  Informed Consent: I have reviewed the patients History and Physical, chart, labs and discussed the procedure including the risks, benefits and alternatives for the proposed anesthesia with the patient or authorized representative who has indicated his/her understanding and acceptance.     Dental advisory given  Plan Discussed with: CRNA  Anesthesia Plan Comments:          Anesthesia Quick Evaluation

## 2023-03-17 ENCOUNTER — Other Ambulatory Visit: Payer: Self-pay

## 2023-03-17 ENCOUNTER — Encounter (HOSPITAL_COMMUNITY): Payer: Self-pay | Admitting: Obstetrics

## 2023-03-17 ENCOUNTER — Ambulatory Visit (HOSPITAL_COMMUNITY)
Admission: RE | Admit: 2023-03-17 | Discharge: 2023-03-17 | Disposition: A | Discharge status: Home / Self Care | Payer: BC Managed Care – PPO | Source: Ambulatory Visit | Attending: Obstetrics | Admitting: Obstetrics

## 2023-03-17 ENCOUNTER — Encounter (HOSPITAL_COMMUNITY)
Admission: RE | Disposition: A | Discharge status: Home / Self Care | Payer: Self-pay | Source: Ambulatory Visit | Attending: Obstetrics

## 2023-03-17 ENCOUNTER — Ambulatory Visit (HOSPITAL_COMMUNITY): Payer: Self-pay

## 2023-03-17 DIAGNOSIS — E782 Mixed hyperlipidemia: Secondary | ICD-10-CM | POA: Insufficient documentation

## 2023-03-17 DIAGNOSIS — I5032 Chronic diastolic (congestive) heart failure: Secondary | ICD-10-CM | POA: Insufficient documentation

## 2023-03-17 DIAGNOSIS — D250 Submucous leiomyoma of uterus: Secondary | ICD-10-CM | POA: Diagnosis not present

## 2023-03-17 DIAGNOSIS — N950 Postmenopausal bleeding: Secondary | ICD-10-CM | POA: Diagnosis present

## 2023-03-17 DIAGNOSIS — N840 Polyp of corpus uteri: Secondary | ICD-10-CM | POA: Diagnosis not present

## 2023-03-17 DIAGNOSIS — I2510 Atherosclerotic heart disease of native coronary artery without angina pectoris: Secondary | ICD-10-CM | POA: Diagnosis not present

## 2023-03-17 DIAGNOSIS — Z01818 Encounter for other preprocedural examination: Secondary | ICD-10-CM

## 2023-03-17 DIAGNOSIS — Z951 Presence of aortocoronary bypass graft: Secondary | ICD-10-CM | POA: Insufficient documentation

## 2023-03-17 DIAGNOSIS — I110 Hypertensive heart disease with heart failure: Secondary | ICD-10-CM | POA: Insufficient documentation

## 2023-03-17 DIAGNOSIS — I252 Old myocardial infarction: Secondary | ICD-10-CM | POA: Insufficient documentation

## 2023-03-17 DIAGNOSIS — E119 Type 2 diabetes mellitus without complications: Secondary | ICD-10-CM | POA: Insufficient documentation

## 2023-03-17 HISTORY — PX: DILATATION & CURETTAGE/HYSTEROSCOPY WITH MYOSURE: SHX6511

## 2023-03-17 HISTORY — DX: Postmenopausal bleeding: N95.0

## 2023-03-17 HISTORY — DX: Personal history of adenomatous and serrated colon polyps: Z86.0101

## 2023-03-17 HISTORY — DX: Abnormal findings on diagnostic imaging of other specified body structures: R93.89

## 2023-03-17 HISTORY — DX: Bicuspid aortic valve: Q23.81

## 2023-03-17 HISTORY — DX: Gastro-esophageal reflux disease without esophagitis: K21.9

## 2023-03-17 HISTORY — DX: Old myocardial infarction: I25.2

## 2023-03-17 HISTORY — DX: Diverticulosis of large intestine without perforation or abscess without bleeding: K57.30

## 2023-03-17 HISTORY — DX: Unspecified diastolic (congestive) heart failure: I50.30

## 2023-03-17 HISTORY — DX: Presence of spectacles and contact lenses: Z97.3

## 2023-03-17 HISTORY — DX: Iron deficiency anemia, unspecified: D50.9

## 2023-03-17 HISTORY — DX: Mixed hyperlipidemia: E78.2

## 2023-03-17 HISTORY — DX: Type 2 diabetes mellitus with mild nonproliferative diabetic retinopathy without macular edema, unspecified eye: E11.3299

## 2023-03-17 HISTORY — DX: Nonrheumatic aortic (valve) stenosis: I35.0

## 2023-03-17 HISTORY — DX: Unspecified hemorrhoids: K64.9

## 2023-03-17 HISTORY — DX: Long term (current) use of anticoagulants: Z79.01

## 2023-03-17 LAB — BASIC METABOLIC PANEL
Anion gap: 7 (ref 5–15)
BUN: 17 mg/dL (ref 8–23)
CO2: 27 mmol/L (ref 22–32)
Calcium: 9.3 mg/dL (ref 8.9–10.3)
Chloride: 107 mmol/L (ref 98–111)
Creatinine, Ser: 0.71 mg/dL (ref 0.44–1.00)
GFR, Estimated: >60 mL/min (ref >60)
Glucose, Bld: 59 mg/dL — ABNORMAL LOW (ref 70–99)
Potassium: 3.6 mmol/L (ref 3.5–5.1)
Sodium: 141 mmol/L (ref 135–145)

## 2023-03-17 LAB — GLUCOSE, CAPILLARY
Glucose-Capillary: 53 mg/dL — ABNORMAL LOW (ref 70–99)
Glucose-Capillary: 54 mg/dL — ABNORMAL LOW (ref 70–99)
Glucose-Capillary: 95 mg/dL (ref 70–99)

## 2023-03-17 SURGERY — DILATATION & CURETTAGE/HYSTEROSCOPY WITH MYOSURE
Anesthesia: General | Site: Vagina

## 2023-03-17 MED ORDER — LIDOCAINE-EPINEPHRINE 1 %-1:100000 IJ SOLN
INTRAMUSCULAR | Status: DC | PRN
  Administered 2023-03-17: 20 mL

## 2023-03-17 MED ORDER — LACTATED RINGERS IV SOLN: INTRAVENOUS | Status: DC

## 2023-03-17 MED ORDER — DEXTROSE 50 % IV SOLN
25.0000 mL | Freq: Once | INTRAVENOUS | Status: AC
  Administered 2023-03-17: 25 mL via INTRAVENOUS
  Filled 2023-03-17: qty 50

## 2023-03-17 MED ORDER — SODIUM CHLORIDE 0.9 % IR SOLN
Status: DC | PRN
  Administered 2023-03-17: 3000 mL

## 2023-03-17 MED ORDER — MIDAZOLAM HCL 2 MG/2ML IJ SOLN
INTRAMUSCULAR | Status: AC
  Filled 2023-03-17: qty 2

## 2023-03-17 MED ORDER — LIDOCAINE 2% (20 MG/ML) 5 ML SYRINGE
INTRAMUSCULAR | Status: DC | PRN
  Administered 2023-03-17: 80 mg via INTRAVENOUS

## 2023-03-17 MED ORDER — CHLORHEXIDINE GLUCONATE 0.12 % MT SOLN
OROMUCOSAL | Status: AC
  Filled 2023-03-17: qty 15

## 2023-03-17 MED ORDER — PHENYLEPHRINE 80 MCG/ML (10ML) SYRINGE FOR IV PUSH (FOR BLOOD PRESSURE SUPPORT)
PREFILLED_SYRINGE | INTRAVENOUS | Status: DC | PRN
  Administered 2023-03-17 (×2): 160 ug via INTRAVENOUS

## 2023-03-17 MED ORDER — OXYCODONE HCL 5 MG/5ML PO SOLN: 5.0000 mg | Freq: Once | ORAL | Status: DC | PRN

## 2023-03-17 MED ORDER — EPHEDRINE 5 MG/ML INJ
INTRAVENOUS | Status: AC
  Filled 2023-03-17: qty 5

## 2023-03-17 MED ORDER — DEXAMETHASONE SODIUM PHOSPHATE 10 MG/ML IJ SOLN
INTRAMUSCULAR | Status: AC
  Filled 2023-03-17: qty 1

## 2023-03-17 MED ORDER — ONDANSETRON HCL 4 MG/2ML IJ SOLN
INTRAMUSCULAR | Status: AC
Start: 2023-03-17 — End: ?
  Filled 2023-03-17: qty 2

## 2023-03-17 MED ORDER — FENTANYL CITRATE (PF) 250 MCG/5ML IJ SOLN
INTRAMUSCULAR | Status: DC | PRN
  Administered 2023-03-17: 100 ug via INTRAVENOUS

## 2023-03-17 MED ORDER — OXYCODONE HCL 5 MG PO TABS: 5.0000 mg | ORAL_TABLET | Freq: Once | ORAL | Status: DC | PRN

## 2023-03-17 MED ORDER — DROPERIDOL 2.5 MG/ML IJ SOLN: 0.6250 mg | Freq: Once | INTRAMUSCULAR | Status: DC | PRN

## 2023-03-17 MED ORDER — FENTANYL CITRATE (PF) 100 MCG/2ML IJ SOLN: 25.0000 ug | INTRAMUSCULAR | Status: DC | PRN

## 2023-03-17 MED ORDER — SUGAMMADEX SODIUM 200 MG/2ML IV SOLN
INTRAVENOUS | Status: AC
  Filled 2023-03-17: qty 2

## 2023-03-17 MED ORDER — POVIDONE-IODINE 10 % EX SWAB: 2.0000 | Freq: Once | CUTANEOUS | Status: DC

## 2023-03-17 MED ORDER — ROCURONIUM BROMIDE 10 MG/ML (PF) SYRINGE
PREFILLED_SYRINGE | INTRAVENOUS | Status: AC
  Filled 2023-03-17: qty 10

## 2023-03-17 MED ORDER — ACETAMINOPHEN 10 MG/ML IV SOLN: 1000.0000 mg | Freq: Once | INTRAVENOUS | Status: DC | PRN

## 2023-03-17 MED ORDER — DEXTROSE 50 % IV SOLN
25.0000 mL | Freq: Once | INTRAVENOUS | Status: AC
  Administered 2023-03-17: 25 mL via INTRAVENOUS

## 2023-03-17 MED ORDER — PROPOFOL 10 MG/ML IV BOLUS
INTRAVENOUS | Status: AC
  Filled 2023-03-17: qty 20

## 2023-03-17 MED ORDER — ORAL CARE MOUTH RINSE: 15.0000 mL | Freq: Once | OROMUCOSAL | Status: AC

## 2023-03-17 MED ORDER — DEXTROSE 50 % IV SOLN
INTRAVENOUS | Status: AC
  Filled 2023-03-17: qty 50

## 2023-03-17 MED ORDER — FENTANYL CITRATE (PF) 250 MCG/5ML IJ SOLN
INTRAMUSCULAR | Status: AC
  Filled 2023-03-17: qty 5

## 2023-03-17 MED ORDER — MIDAZOLAM HCL 2 MG/2ML IJ SOLN
INTRAMUSCULAR | Status: DC | PRN
  Administered 2023-03-17: 2 mg via INTRAVENOUS

## 2023-03-17 MED ORDER — DEXAMETHASONE SODIUM PHOSPHATE 10 MG/ML IJ SOLN
INTRAMUSCULAR | Status: DC | PRN
  Administered 2023-03-17: 10 mg via INTRAVENOUS

## 2023-03-17 MED ORDER — LIDOCAINE 2% (20 MG/ML) 5 ML SYRINGE
INTRAMUSCULAR | Status: AC
  Filled 2023-03-17: qty 5

## 2023-03-17 MED ORDER — ONDANSETRON HCL 4 MG/2ML IJ SOLN
INTRAMUSCULAR | Status: DC | PRN
  Administered 2023-03-17: 4 mg via INTRAVENOUS

## 2023-03-17 MED ORDER — INSULIN ASPART 100 UNIT/ML IJ SOLN: 0.0000 [IU] | INTRAMUSCULAR | Status: DC | PRN

## 2023-03-17 MED ORDER — CHLORHEXIDINE GLUCONATE 0.12 % MT SOLN
15.0000 mL | Freq: Once | OROMUCOSAL | Status: AC
  Administered 2023-03-17: 15 mL via OROMUCOSAL

## 2023-03-17 MED ORDER — LIDOCAINE-EPINEPHRINE 1 %-1:100000 IJ SOLN
INTRAMUSCULAR | Status: AC
  Filled 2023-03-17: qty 1

## 2023-03-17 MED ORDER — PHENYLEPHRINE 80 MCG/ML (10ML) SYRINGE FOR IV PUSH (FOR BLOOD PRESSURE SUPPORT)
PREFILLED_SYRINGE | INTRAVENOUS | Status: AC
  Filled 2023-03-17: qty 10

## 2023-03-17 MED ORDER — PROPOFOL 10 MG/ML IV BOLUS
INTRAVENOUS | Status: DC | PRN
  Administered 2023-03-17: 170 mg via INTRAVENOUS

## 2023-03-17 SURGICAL SUPPLY — 17 items
CANISTER SUCT 3000ML PPV (MISCELLANEOUS) ×2 IMPLANT
CATH ROBINSON RED A/P 16FR (CATHETERS) ×2 IMPLANT
DEVICE MYOSURE LITE (MISCELLANEOUS) IMPLANT
DEVICE MYOSURE REACH (MISCELLANEOUS) IMPLANT
GLOVE BIO SURGEON STRL SZ7 (GLOVE) ×2 IMPLANT
GLOVE SURG UNDER POLY LF SZ7 (GLOVE) ×4 IMPLANT
GOWN STRL REUS W/ TWL LRG LVL3 (GOWN DISPOSABLE) ×4 IMPLANT
KIT PROCEDURE FLUENT (KITS) ×2 IMPLANT
KIT TURNOVER KIT B (KITS) ×2 IMPLANT
MYOSURE XL FIBROID (MISCELLANEOUS) IMPLANT
PACK VAGINAL MINOR WOMEN LF (CUSTOM PROCEDURE TRAY) ×2 IMPLANT
PAD OB MATERNITY 11 LF (PERSONAL CARE ITEMS) ×2 IMPLANT
SEAL ROD LENS SCOPE MYOSURE (ABLATOR) ×2 IMPLANT
SOL .9 NS 3000ML IRR UROMATIC (IV SOLUTION) IMPLANT
SYSTEM TISS REMOVAL MYOSURE XL (MISCELLANEOUS) IMPLANT
TOWEL GREEN STERILE FF (TOWEL DISPOSABLE) ×4 IMPLANT
UNDERPAD 30X36 HEAVY ABSORB (UNDERPADS AND DIAPERS) ×2 IMPLANT

## 2023-03-17 NOTE — Anesthesia Procedure Notes (Signed)
 Procedure Name: LMA Insertion Date/Time: 03/17/2023 7:44 AM  Performed by: April Holding, CRNAPre-anesthesia Checklist: Patient identified, Emergency Drugs available, Suction available and Patient being monitored Patient Re-evaluated:Patient Re-evaluated prior to induction Oxygen Delivery Method: Circle System Utilized Preoxygenation: Pre-oxygenation with 100% oxygen Induction Type: IV induction Ventilation: Mask ventilation without difficulty LMA: LMA inserted LMA Size: 4.0 Number of attempts: 1 Airway Equipment and Method: Bite block Placement Confirmation: positive ETCO2 Tube secured with: Tape Dental Injury: Teeth and Oropharynx as per pre-operative assessment

## 2023-03-17 NOTE — Transfer of Care (Signed)
 Immediate Anesthesia Transfer of Care Note  Patient: Catherine Moon  Procedure(s) Performed: DILATATION & CURETTAGE/HYSTEROSCOPY WITH MYOSURE (Vagina )  Patient Location: PACU  Anesthesia Type:General  Level of Consciousness: awake, alert , and oriented  Airway & Oxygen Therapy: Patient Spontanous Breathing  Post-op Assessment: Report given to RN and Post -op Vital signs reviewed and stable  Post vital signs: Reviewed and stable  Last Vitals:  Vitals Value Taken Time  BP 135/65 03/17/23 0819  Temp    Pulse 78 03/17/23 0821  Resp 14 03/17/23 0821  SpO2 89 % 03/17/23 0821  Vitals shown include unfiled device data.  Last Pain:  Vitals:   03/17/23 0602  TempSrc: Oral  PainSc: 0-No pain      Patients Stated Pain Goal: 5 (03/17/23 0602)  Complications: No notable events documented.

## 2023-03-17 NOTE — Anesthesia Postprocedure Evaluation (Signed)
 Anesthesia Post Note  Patient: Catherine Moon  Procedure(s) Performed: DILATATION & CURETTAGE/HYSTEROSCOPY WITH MYOSURE (Vagina )     Patient location during evaluation: PACU Anesthesia Type: General Level of consciousness: awake and alert Pain management: pain level controlled Vital Signs Assessment: post-procedure vital signs reviewed and stable Respiratory status: spontaneous breathing, nonlabored ventilation, respiratory function stable and patient connected to nasal cannula oxygen Cardiovascular status: blood pressure returned to baseline and stable Postop Assessment: no apparent nausea or vomiting Anesthetic complications: no   No notable events documented.  Last Vitals:  Vitals:   03/17/23 0845 03/17/23 0858  BP: 131/70 138/68  Pulse: 81 80  Resp: 17 18  Temp:    SpO2: 91% 92%    Last Pain:  Vitals:   03/17/23 0819  TempSrc:   PainSc: 0-No pain                 Dunnellon Nation

## 2023-03-17 NOTE — Interval H&P Note (Signed)
 History and Physical Interval Note:  03/17/2023 7:25 AM     03/17/2023    6:02 AM 03/15/2023    5:01 PM 01/28/2023    1:03 PM  Vitals with BMI  Height 5\' 6"  5\' 6"  5\' 6"   Weight 215 lbs 215 lbs 224 lbs 10 oz  BMI 34.72 34.72 36.27  Systolic 157  130  Diastolic 62  70  Pulse 85  86     Catherine Moon  has presented today for surgery, with the diagnosis of Thick Endometrium, Persistent Postmenopausal Bleeding.  The various methods of treatment have been discussed with the patient and family. After consideration of risks, benefits and other options for treatment, the patient has consented to  Procedure(s) with comments: DILATATION & CURETTAGE/HYSTEROSCOPY WITH MYOSURE (N/A) [facility] as a surgical intervention.  The patient's history has been reviewed, patient examined, no change in status, stable for surgery.  I have reviewed the patient's chart and labs.  Questions were answered to the patient's satisfaction.     Antony Salmon D'iorio

## 2023-03-17 NOTE — Op Note (Signed)
 Operative Report  Surgeon: Antony Salmon D'iorio, MD  Assistant: None  Preoperative Diagnosis: Postmenopausal bleeding  Postoperative Diagnosis: Same  Anesthesia: General  Implants: None  Specimen: Endometrial curetting, Cervical polyp  Drains: None  Fluids: Per anesthesia record  EBL: 5 mL   Fluid deficit: 130  mL   Complications: None  Findings: EUA: Normal external female genitalia. Normal appearing vagina. Cervix with large 4cm polyp extruding into vagina.   Intra-op: Bilateral ostia visualized. Endometrium showed diffusely thickened polypoid endometrium. Hemostatic at end of case.   Procedure in Detail:  The patient was seen in the preoperative holding area, consents were verified and all questions and concerns related to the proposed procedure were discussed in detail. The patient was transferred to the operating room where SCD's were placed. The patient underwent general anesthesia with LMA intubation without complication. The patient was placed in dorsal lithotomy position and prepped and draped in usual sterile fashion.The hysteroscope was prepped and zeroed. A time out was performed with all team members.   Attention was turned to the vagina. A speculum was placed and cervix was visualized. The anterior lip of the cervix was grasped with a single-tooth tenaculum. The cervical polyp was grasped with polyp forceps and removed easily with gentle traction. The cervix was dilated from cervical polyp and required no further dilation. The hysteroscope was passed through the cervix into the uterine cavity without difficulty with findings as above. Bilateral ostia were visualized. Myosure device was then used to remove tissue and perform global sampling of endometrium. Global view of endometrium at end of case showed no remaining abnormal tissue. Sharp curettage was performed until gritty texture felt throughout uterus to ensure adequate sampling. All instruments were removed from the  uterus and vagina. Cervix was inspected and hemostasis ensured. All sponge and instrument counts correct x2.   Patient was awakened from anesthesia and taken to the recovery room in stable condition.   Marlene Bast, MD

## 2023-03-17 NOTE — Discharge Instructions (Addendum)
 Post-Operative Discharge Instructions  Surgery: Hysteroscopy Surgeon: Marlene Bast, MD  Please follow the instructions below during your recovery period. If you have questions or concerns please call our office at [phone].  Please look out for the following and call the office immediately or go to nearest Emergency Room if you experience: - Chest pain - Shortness of breath - Fever >100.4 F  - Persistent nausea and/or vomiting - Difficulty urinating or not urinating for >6 hours  - Severe abdominal or pelvic pain not responsive to recommended pain medications - Heavy vaginal bleeding (soaking >2 pads per hour)  - Pain, swelling, and/or redness in one leg   Activity: You may resume normal activity as tolerated. We recommend walking as soon and as much as you are comfortably able.  Please do not put in anything in the vagina for 2 weeks after your surgery. This includes: - No intercourse - No tampons - No douching - No sitting in water (e.g. pools, baths, etc.)   Expectations: - Light vaginal bleeding is normal after your procedure and should resolve in the next few days. - Some abdominal soreness and bloating is normal. Please see below for recommended medications.  - Feeling more tired than usual for a few days following anesthesia is normal.   Diet: You may resume normal diet immediately after surgery. Anesthesia can make you feel nauseous, we recommend avoiding spicy or acidic foods for the first 72 hours after surgery. Drink lots of water (64oz/day).   Pain: We expect you to have some abdominal/pelvic pain and cramping for a few days after your surgery. We recommend taking medication around the clock for the first 48-72 hours after your surgery as follows: - Acetaminophen (Tylenol) 1000mg  every 6 hours  - Ibuprofen (Advil or Motrin) 600mg  every 6 hours - Miralax or other stool softener to keep bowel movements soft and avoid straining   You may restart your plavix  tomorrow.   We wish you a speedy recovery!           Post Anesthesia Home Care Instructions  Activity: Get plenty of rest for the remainder of the day. A responsible individual must stay with you for 24 hours following the procedure.  For the next 24 hours, DO NOT: -Drive a car -Advertising copywriter -Drink alcoholic beverages -Take any medication unless instructed by your physician -Make any legal decisions or sign important papers.  Meals: Start with liquid foods such as gelatin or soup. Progress to regular foods as tolerated. Avoid greasy, spicy, heavy foods. If nausea and/or vomiting occur, drink only clear liquids until the nausea and/or vomiting subsides. Call your physician if vomiting continues.  Special Instructions/Symptoms: Your throat may feel dry or sore from the anesthesia or the breathing tube placed in your throat during surgery. If this causes discomfort, gargle with warm salt water. The discomfort should disappear within 24 hours.

## 2023-03-18 ENCOUNTER — Encounter (HOSPITAL_COMMUNITY): Payer: Self-pay | Admitting: Obstetrics

## 2023-03-18 LAB — SURGICAL PATHOLOGY

## 2023-04-05 ENCOUNTER — Telehealth: Payer: Self-pay

## 2023-04-05 NOTE — Telephone Encounter (Signed)
 Spoke with Catherine Moon regarding her referral to GYN oncology. She has an appointment scheduled with Dr. Alvester Morin on 4/21 at 9:45. Patient agrees to date and time. She has been provided with office address and location. She is also aware of our mask and visitor policy. Patient verbalized understanding and will call with any questions.

## 2023-04-21 ENCOUNTER — Encounter: Payer: Self-pay | Admitting: Psychiatry

## 2023-04-25 ENCOUNTER — Encounter: Payer: Self-pay | Admitting: Psychiatry

## 2023-04-25 ENCOUNTER — Inpatient Hospital Stay: Attending: Psychiatry | Admitting: Psychiatry

## 2023-04-25 VITALS — BP 122/64 | HR 85 | Temp 98.5°F | Resp 17 | Ht 66.0 in | Wt 218.8 lb

## 2023-04-25 DIAGNOSIS — N949 Unspecified condition associated with female genital organs and menstrual cycle: Secondary | ICD-10-CM

## 2023-04-25 DIAGNOSIS — I350 Nonrheumatic aortic (valve) stenosis: Secondary | ICD-10-CM | POA: Diagnosis not present

## 2023-04-25 DIAGNOSIS — Z951 Presence of aortocoronary bypass graft: Secondary | ICD-10-CM | POA: Diagnosis not present

## 2023-04-25 DIAGNOSIS — E119 Type 2 diabetes mellitus without complications: Secondary | ICD-10-CM | POA: Insufficient documentation

## 2023-04-25 DIAGNOSIS — Z800 Family history of malignant neoplasm of digestive organs: Secondary | ICD-10-CM | POA: Insufficient documentation

## 2023-04-25 DIAGNOSIS — I5030 Unspecified diastolic (congestive) heart failure: Secondary | ICD-10-CM | POA: Insufficient documentation

## 2023-04-25 DIAGNOSIS — N8500 Endometrial hyperplasia, unspecified: Secondary | ICD-10-CM | POA: Insufficient documentation

## 2023-04-25 DIAGNOSIS — I252 Old myocardial infarction: Secondary | ICD-10-CM | POA: Insufficient documentation

## 2023-04-25 DIAGNOSIS — E782 Mixed hyperlipidemia: Secondary | ICD-10-CM | POA: Diagnosis not present

## 2023-04-25 DIAGNOSIS — E7800 Pure hypercholesterolemia, unspecified: Secondary | ICD-10-CM | POA: Diagnosis not present

## 2023-04-25 DIAGNOSIS — Z801 Family history of malignant neoplasm of trachea, bronchus and lung: Secondary | ICD-10-CM | POA: Insufficient documentation

## 2023-04-25 DIAGNOSIS — Z803 Family history of malignant neoplasm of breast: Secondary | ICD-10-CM | POA: Insufficient documentation

## 2023-04-25 DIAGNOSIS — R011 Cardiac murmur, unspecified: Secondary | ICD-10-CM | POA: Diagnosis not present

## 2023-04-25 DIAGNOSIS — D219 Benign neoplasm of connective and other soft tissue, unspecified: Secondary | ICD-10-CM

## 2023-04-25 DIAGNOSIS — N950 Postmenopausal bleeding: Secondary | ICD-10-CM | POA: Diagnosis not present

## 2023-04-25 DIAGNOSIS — I10 Essential (primary) hypertension: Secondary | ICD-10-CM | POA: Diagnosis not present

## 2023-04-25 DIAGNOSIS — I2510 Atherosclerotic heart disease of native coronary artery without angina pectoris: Secondary | ICD-10-CM | POA: Diagnosis not present

## 2023-04-25 DIAGNOSIS — K219 Gastro-esophageal reflux disease without esophagitis: Secondary | ICD-10-CM | POA: Insufficient documentation

## 2023-04-25 NOTE — Patient Instructions (Signed)
 Plan to have an MRI with a follow up visit after with Dr. Daisey Dryer to discuss.  Please call for any needs or concerns.

## 2023-04-25 NOTE — Progress Notes (Signed)
 GYNECOLOGIC ONCOLOGY NEW PATIENT CONSULTATION  Date of Service: 04/25/2023 Referring Provider: Ellsworth Haas, MD   ASSESSMENT AND PLAN: Catherine Moon is a 63 y.o. woman with postmenopausal bleeding with benign sampling (proliferative endometrium) and removal of submucosal fibroid and ultrasound with bilateral ovarian cysts, medically complicated by CAD with recent CABG, insulin -dependent diabetes, HTN, HLD.   Reviewed workup to date.  Reviewed ultrasound reports with patient.  Do not have images to review of these ultrasounds.  Normal tumor markers.  At this time, reviewed with patient that the etiology of the cyst could include a benign, borderline, or malignant process.  Difficult to discern level of concern but cystic areas overall appear small by measurements.  Unclear if additional cystic area represents a hydrosalpinx as suggested possibly by ultrasound report.  Recommend additional imaging with MRI for better characterization of the adnexal cystic lesions.  Additionally, reviewed that her endometrial sampling demonstrated proliferative endometrium.  Although she does not have a premalignant or malignant process of the endometrium, this is also not normal in the setting of postmenopausal status.  Reviewed risk factors to include obesity.  Discussed that she may benefit from progestin therapy in the setting of a persistent risk factor.  Could consider repeat biopsy after initiation of progestin therapy in about 6 months.  Reviewed risk of doing premalignant or malignant process if continued risk factor (obesity) present without treatment.   Will follow-up MRI and if benign-appearing, would likely recommend referral back to her OB/GYN for management of proliferative endometrium.  RTC following MRI.  A copy of this note was sent to the patient's referring provider.  [name], MD Gynecologic Oncology   Medical Decision Making I personally spent  TOTAL 45 minutes  face-to-face and non-face-to-face in the care of this patient, which includes all pre, intra, and post visit time on the date of service.   ------------  CC: ovarian cyst  HISTORY OF PRESENT ILLNESS:  Catherine Moon is a 63 y.o. woman who is seen in consultation at the request of Ellsworth Haas, MD for evaluation of ovarian cysts.  Patient was seen by OB/GYN in 02/09/2023 for evaluation of postmenopausal bleeding.  She had previously followed with a different OB/GYN but had to switch for insurance issues.  She reported multiple episodes of postmenopausal bleeding and had undergone an endometrial biopsy a few years ago which she noted was normal.  She then again started bleeding in September, on and off.  At the time of the visit she reported current bleeding that was heavy like a period with clots.  An endometrial biopsy was reportedly benign in the month prior with plans to proceed with a hysteroscopy D&C.  At the time of this visit she was also noted to have bilateral ovaries with small cysts.  A CA125 was collected and normal at 12.  CEA was normal at 0.7.  And CA 19-9 was normal at 18.  On 03/17/2023 she underwent a hysteroscopy, D&C and cervical polyp removal.  The polyp was benign and the endometrial curetting showed disordered proliferative endometrium and submucosal leiomyoma.  Today, patient reports that she is overall doing well and endorses the history as above.  She reports that she has not had any bleeding since her D&C.  In terms of her medical comorbidities, she is currently following with Dr. Aldona Amel with [facility] endocrinology with her next follow-up in June.  She is currently on Humulin  80/60/60 and Mounjaro .  Her last point-of-care hemoglobin A1c was 5.9 on 01/28/2023.  She  recently underwent CABG in November 2024.  She follows with Dr. Abel Hoe with cardiology with her next follow-up on 05/19/2023.   PAST MEDICAL HISTORY: Past Medical History:  Diagnosis Date   (HFpEF)  heart failure with preserved ejection fraction (HCC)    Anticoagulated    plavix  and asa 81mg   ---- managed by cardiology   Aortic stenosis due to bicuspid aortic valve    per intraoperative TEE 11-30-2023  aortic bicuspid valve w/ mild stenosis,  area 1.46   Coronary artery disease 11/2022   cardiologist--- dr Abel Hoe;   NSTEMI 11-26-2022  cath w/ severe 2 vessel CAD ;   11-30-2022  s/p CABG x2   CTS (carpal tunnel syndrome)    Diverticulosis of colon    GERD (gastroesophageal reflux disease)    Heart murmur    had ECHO 01-2021   Hemorrhoids    interenal and external   History of adenomatous polyp of colon    History of non-ST elevation myocardial infarction (NSTEMI)    HTN (hypertension)    Hyperlipidemia, mixed    IDA (iron deficiency anemia)    Insulin  dependent type 2 diabetes mellitus [facility]) 1997   endocrinologist--- dr Aldona Amel;   dx 1997 DM2;  started insulin  in 2010   Mild nonproliferative diabetic retinopathy without macular edema associated with type 2 diabetes mellitus (HCC)    followed gy dr [place] Atta;   both eyes   PMB (postmenopausal bleeding)    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   LIMA--LAD;   RSVG--PLV  by dr lightfoot   Seasonal allergies    Thickened endometrium    Warts, genital    Wears glasses     PAST SURGICAL HISTORY: Past Surgical History:  Procedure Laterality Date   COLONOSCOPY WITH PROPOFOL   02/17/2015   dr Lorain Robson   CORONARY ARTERY BYPASS GRAFT N/A 11/30/2022   Procedure: CORONARY ARTERY BYPASS GRAFTING X 2 , USING LEFT INTERNAL MAMMARY ARTERY AND ENDOSCOPICALLY HARVESTED RIGHT SAPHENOUS VEIN GRAFT;  Surgeon: Hilarie Lovely, MD;  Location: [facility] OR;  Service: Open Heart Surgery;  Laterality: N/A;   CYST EXCISION Right 03/26/2021   Procedure: RIGHT AXILLARY SEBACEOUS CYST EXCISION;  Surgeon: Enid Harry, MD;  Location: [facility];  Service: General;  Laterality: Right;  LOCAL   DILATATION & CURETTAGE/HYSTEROSCOPY WITH MYOSURE N/A  03/17/2023   Procedure: DILATATION & CURETTAGE/HYSTEROSCOPY WITH MYOSURE;  Surgeon: D'Iorio, Tresa Frohlich, MD;  Location: [facility] OR;  Service: Gynecology;  Laterality: N/A;  [facility]   DILATION AND CURETTAGE OF UTERUS  1977   LEFT HEART CATH AND CORONARY ANGIOGRAPHY N/A 11/26/2022   Procedure: LEFT HEART CATH AND CORONARY ANGIOGRAPHY;  Surgeon: Wenona Hamilton, MD;  Location: [facility];  Service: Cardiovascular;  Laterality: N/A;   TEE WITHOUT CARDIOVERSION N/A 11/30/2022   Procedure: TRANSESOPHAGEAL ECHOCARDIOGRAM (TEE);  Surgeon: Hilarie Lovely, MD;  Location: [facility] OR;  Service: Open Heart Surgery;  Laterality: N/A;   WISDOM TOOTH EXTRACTION      OB/GYN HISTORY: OB History  Gravida Para Term Preterm AB Living  0 0 0 0 0 0  SAB IAB Ectopic Multiple Live Births  0 0 0 0 0      Age at menarche: 55 Age at menopause: No period since mid 71s but was on Mirena IUD, last bleed after Mirena removal was around age 29 Hx of HRT: no Hx of STI: no Last pap: 2021, nml per pt History of abnormal pap smears: none  SCREENING STUDIES:  Last mammogram:  08/2019 Last colonoscopy: 2017, was recommended 35yr follow-up  MEDICATIONS:  Current Outpatient Medications:    acetaminophen  (TYLENOL ) 500 MG tablet, Take 1,000 mg by mouth every 6 (six) hours as needed for mild pain, moderate pain, fever or headache., Disp: , Rfl:    amLODipine  (NORVASC ) 5 MG tablet, Take 1 tablet (5 mg total) by mouth daily. (Patient taking differently: Take 5 mg by mouth daily after lunch.), Disp: 90 tablet, Rfl: 3   aspirin  81 MG EC tablet, Take 81 mg by mouth daily after lunch., Disp: , Rfl:    atorvastatin  (LIPITOR ) 80 MG tablet, TAKE 1 TABLET (80 MG TOTAL) BY MOUTH DAILY. DUE FOR PHYSICAL. (Patient taking differently: Take 80 mg by mouth every evening. Due for physical.), Disp: 90 tablet, Rfl: 1   Black Elderberry 1000 MG CAPS, Take 1,000 mg by mouth daily after lunch., Disp: , Rfl:    calcium  carbonate (OSCAL) 1500 (600  Ca) MG TABS tablet, Take 1,200 mg of elemental calcium  by mouth daily after lunch., Disp: , Rfl:    cholecalciferol (VITAMIN D3) 25 MCG (1000 UNIT) tablet, Take 1,000 Units by mouth daily after lunch., Disp: , Rfl:    clopidogrel  (PLAVIX ) 75 MG tablet, Take 1 tablet (75 mg total) by mouth daily. (Patient taking differently: Take 75 mg by mouth daily after lunch.), Disp: 90 tablet, Rfl: 3   Continuous Glucose Sensor (FREESTYLE LIBRE 3 PLUS SENSOR) MISC, USe as directed every 14 (fourteen) days., Disp: 6 each, Rfl: 3   Cranberry 4200 MG CAPS, Take 8,400 mg by mouth daily after lunch., Disp: , Rfl:    ezetimibe  (ZETIA ) 10 MG tablet, Take 1 tablet (10 mg total) by mouth daily. (Patient taking differently: Take 10 mg by mouth daily after lunch.), Disp: 90 tablet, Rfl: 3   famotidine (PEPCID) 10 MG tablet, Take 10 mg by mouth daily after lunch., Disp: , Rfl:    ferrous sulfate  325 (65 FE) MG tablet, Take 325 mg by mouth daily after lunch., Disp: , Rfl:    fexofenadine (ALLEGRA) 180 MG tablet, Take 180 mg by mouth daily after lunch., Disp: , Rfl:    Ginger, Zingiber officinalis, (GINGER ROOT) 550 MG CAPS, Take 550 mg by mouth daily after lunch., Disp: , Rfl:    Green Tea 315 MG CAPS, Take 3,015 mg by mouth daily after lunch., Disp: , Rfl:    ibuprofen (ADVIL) 200 MG tablet, Take 400 mg by mouth every 6 (six) hours as needed for moderate pain (pain score 4-6) or mild pain (pain score 1-3)., Disp: , Rfl:    Insulin  Pen Needle 32G X 4 MM MISC, Use 4x a day, Disp: 400 each, Rfl: 3   insulin  regular human CONCENTRATED (HUMULIN  R U-500 KWIKPEN) 500 UNIT/ML KwikPen, Inject under skin up to 200 units insulin  a day as advised (Patient taking differently: Inject 60-80 Units into the skin See admin instructions. Take 80 units in the morning ( pt stated actual for her is after lunch is her breakfast) and 60 units lunch ( actual time 7:30pm--8:30pm)  and 60 units at dinner  (actual time ,  12:30 am)), Disp: 36 mL, Rfl: 2    loratadine  (CLARITIN ) 10 MG tablet, Take 10 mg by mouth daily after lunch., Disp: , Rfl:    magnesium  oxide (MAG-OX) 400 MG tablet, Take 400 mg by mouth daily after lunch., Disp: , Rfl:    metoprolol  tartrate (LOPRESSOR ) 50 MG tablet, Take 1 tablet (50 mg total) by mouth 2 (two) times daily. (  Patient taking differently: Take 50 mg by mouth 2 (two) times daily. After lunch and evening), Disp: 180 tablet, Rfl: 2   Multiple Vitamin (MULTIVITAMIN WITH MINERALS) TABS tablet, Take 1 tablet by mouth daily after lunch., Disp: , Rfl:    Multiple Vitamins-Minerals (HAIR SKIN & NAILS) TABS, Take 1 tablet by mouth daily after lunch., Disp: , Rfl:    Multiple Vitamins-Minerals (PRESERVISION AREDS PO), Take 1 capsule by mouth daily after lunch., Disp: , Rfl:    Potassium 99 MG TABS, Take 1 tablet (99 mg total) by mouth daily. (Patient taking differently: Take 1 tablet by mouth daily after lunch.), Disp: , Rfl:    Probiotic Product (PROBIOTIC DAILY PO), Take by mouth., Disp: , Rfl:    sodium chloride  (OCEAN) 0.65 % SOLN nasal spray, Place 1 spray into both nostrils daily as needed for congestion., Disp: , Rfl:    tirzepatide  (MOUNJARO ) 10 MG/0.5ML Pen, Inject 10 mg into the skin once a week. (Patient taking differently: Inject 10 mg into the skin once a week. Thursday 's), Disp: 6 mL, Rfl: 3   zinc gluconate 50 MG tablet, Take 50 mg by mouth daily after lunch., Disp: , Rfl:   ALLERGIES: No Known Allergies  FAMILY HISTORY: Family History  Problem Relation Age of Onset   Breast cancer Mother    Heart disease Mother    Coronary artery disease Father        starting in his 59s. A bypass and mitral infarction   Heart disease Father        Coronary Disease, bypass and mitral infarction   Lung cancer Maternal Grandfather        Forensic scientist   Colon cancer Paternal Grandmother    Prostate cancer Neg Hx    Pancreatic cancer Neg Hx    Ovarian cancer Neg Hx    Endometrial cancer Neg Hx     SOCIAL  HISTORY: Social History   Socioeconomic History   Marital status: Married    Spouse name: Not on file   Number of children: Not on file   Years of education: Not on file   Highest education level: Associate degree: academic program  Occupational History   Not on file  Tobacco Use   Smoking status: Former    Current packs/day: 0.00    Average packs/day: 1 pack/day for 26.0 years (26.0 ttl pk-yrs)    Types: Cigarettes    Start date: 08/12/1981    Quit date: 08/13/2007    Years since quitting: 15.7   Smokeless tobacco: Never   Tobacco comments:     She smoked one pack per day for 26 years but quit in 2009.  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  She smoked one pack per day for 26 years but quit in 2009.         Social Drivers of Corporate investment banker Strain: Low Risk  (11/25/2022)   Overall Financial Resource Strain (CARDIA)    Difficulty of Paying Living Expenses: Not hard at all  Food Insecurity: No Food Insecurity (01/11/2023)   Hunger Vital Sign    Worried About Running Out of Food in the Last Year: Never true  Ran Out of Food in the Last Year: Never true  Transportation Needs: No Transportation Needs (01/11/2023)   PRAPARE - Administrator, Civil Service (Medical): No    Lack of Transportation (Non-Medical): No  Physical Activity: Unknown (11/25/2022)   Exercise Vital Sign    Days of Exercise per Week: Patient declined    Minutes of Exercise per Session: Not on file  Stress: No Stress Concern Present (11/25/2022)   [employer] of Occupational Health - Occupational Stress Questionnaire    Feeling of Stress : Only a little  Social Connections: Socially Isolated (11/25/2022)   Social Connection and  Isolation Panel [NHANES]    Frequency of Communication with Friends and Family: Once a week    Frequency of Social Gatherings with Friends and Family: Never    Attends Religious Services: Never    Database administrator or Organizations: No    Attends Engineer, structural: Not on file    Marital Status: Married  Catering manager Violence: Not At Risk (01/11/2023)   Humiliation, Afraid, Rape, and Kick questionnaire    Fear of Current or Ex-Partner: No    Emotionally Abused: No    Physically Abused: No    Sexually Abused: No    REVIEW OF SYSTEMS: New patient intake form was reviewed.  Complete 10-system review is negative except for the following: none  PHYSICAL EXAM: BP (!) 143/56 (BP Location: Left Arm, Patient Position: Sitting)   Pulse 85   Temp 98.5 F (36.9 C) (Oral)   Resp 17   Ht 5\' 6"  (1.676 m)   Wt 218 lb 12.8 oz (99.2 kg)   SpO2 98%   BMI 35.32 kg/m  Constitutional: No acute distress. Neuro/Psych: Alert, oriented.  Head and Neck: Normocephalic, atraumatic. Neck symmetric without masses. Sclera anicteric.  Respiratory: Normal work of breathing. Clear to auscultation bilaterally. Cardiovascular: Regular rate and rhythm, no murmurs, rubs, or gallops. Abdomen: Normoactive bowel sounds. Soft, non-distended, non-tender to palpation. No masses appreciated.  Extremities: Grossly normal range of motion. Warm, well perfused. No edema bilaterally. Skin: No rashes or lesions. Lymphatic: No cervical, supraclavicular, or inguinal adenopathy. Genitourinary: External genitalia without lesions. Urethral meatus without lesions or prolapse. On speculum exam, vagina and cervix without lesions. Bimanual exam reveals normal cervix. Uterus and adnexal difficult to palpate due to body habitus. No discrete masses or nodularity.  Exam chaperoned by Vira Grieves, NP   LABORATORY AND RADIOLOGIC DATA: Outside medical records were reviewed to synthesize the above history, along with the  history and physical obtained during the visit.  Outside laboratory, pathology, and imaging reports were reviewed, with pertinent results below.  I personally reviewed the outside images.  WBC  Date Value Ref Range Status  12/21/2022 11.3 (H) 4.0 - 10.5 K/uL Final   Hemoglobin  Date Value Ref Range Status  12/21/2022 12.3 12.0 - 15.0 g/dL Final   HCT  Date Value Ref Range Status  12/21/2022 38.7 36.0 - 46.0 % Final   Platelets  Date Value Ref Range Status  12/21/2022 637.0 (H) 150.0 - 400.0 K/uL Final   Magnesium   Date Value Ref Range Status  12/01/2022 2.2 1.7 - 2.4 mg/dL Final    Comment:    Performed at [facility], [address], [place], [place] [address]   Creatinine, Ser  Date Value Ref Range Status  03/17/2023 0.71 0.44 - 1.00 mg/dL Final   AST  Date Value Ref Range Status  09/14/2022 19 0 - 37  U/L Final   ALT  Date Value Ref Range Status  09/14/2022 22 0 - 35 U/L Final   Tumor markers (02/09/23): CA125 - 12   CEA 0.7   CA 19-9 - 18     Surgical pathology (03/17/23): A. CERVIX, POLYPECTOMY:  Benign lower segment- Benign endometrial type polyp..  Negative for endometrial intraepithelial neoplasia (EIN) and malignancy.   B. ENDOMETRIUM, CURETTAGE:  Endometrium with disordered proliferative pattern.  Submucosal leiomyoma.  Negative for endometrial intraepithelial neoplasia (EIN) and malignancy.    Pelvic ultrasound (03/31/23): Bilateral ovaries again appear enlarged and diffusely cystic with multiple echogenic foci within.  No increased blood flow. Left ovary largest cyst = 1.4 x 1.3 cm Right ovarian largest cyst = 1.3 x 0.9 cm An elongated structure is noted adjacent to the left ovary?  Hydrosalpinx measuring 3.2 x 2.9 x 1.2 cm

## 2023-04-26 ENCOUNTER — Encounter: Payer: Self-pay | Admitting: Psychiatry

## 2023-05-03 ENCOUNTER — Ambulatory Visit (HOSPITAL_COMMUNITY)
Admission: RE | Admit: 2023-05-03 | Discharge: 2023-05-03 | Disposition: A | Discharge status: Home / Self Care | Source: Ambulatory Visit | Attending: Psychiatry | Admitting: Psychiatry

## 2023-05-03 DIAGNOSIS — N949 Unspecified condition associated with female genital organs and menstrual cycle: Secondary | ICD-10-CM | POA: Insufficient documentation

## 2023-05-03 MED ORDER — GADOBUTROL 1 MMOL/ML IV SOLN
10.0000 mL | Freq: Once | INTRAVENOUS | Status: AC | PRN
  Administered 2023-05-03: 10 mL via INTRAVENOUS

## 2023-05-05 ENCOUNTER — Encounter: Payer: Self-pay | Admitting: Obstetrics

## 2023-05-16 ENCOUNTER — Encounter: Payer: Self-pay | Admitting: Psychiatry

## 2023-05-16 ENCOUNTER — Inpatient Hospital Stay: Attending: Psychiatry

## 2023-05-16 ENCOUNTER — Inpatient Hospital Stay: Admitting: Psychiatry

## 2023-05-16 VITALS — BP 132/52 | HR 82 | Temp 98.4°F | Resp 18 | Ht 66.0 in | Wt 222.0 lb

## 2023-05-16 DIAGNOSIS — N949 Unspecified condition associated with female genital organs and menstrual cycle: Secondary | ICD-10-CM

## 2023-05-16 DIAGNOSIS — E288 Other ovarian dysfunction: Secondary | ICD-10-CM | POA: Diagnosis not present

## 2023-05-16 DIAGNOSIS — E281 Androgen excess: Secondary | ICD-10-CM | POA: Diagnosis not present

## 2023-05-16 NOTE — Progress Notes (Signed)
 Gynecologic Oncology Return Clinic Visit  Date of Service: 05/16/2023 Referring Provider: Ellsworth Haas, MD   Assessment & Plan: Catherine Moon is a 63 y.o. woman with postmenopausal bleeding with benign sampling (proliferative endometrium) and removal of submucosal fibroid and ultrasound with bilateral ovarian cysts, medically complicated by CAD with recent CABG, insulin -dependent diabetes, HTN, HLD. Presents today for follow-up after MRI.  Reviewed MRI findings.  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  No concerning adnexal mass otherwise.  On review of records, do not see that patient's testosterone level has been evaluated at least available in her [facility] chart.  Recommend testosterone level today.  Reviewed that if she were to have an elevated testosterone consistent with ovarian hyperthecosis, treatment in a postmenopausal woman would often be with BSO.  Although patient has multiple medical comorbidities, given her recent CABG, she may be most optimized for surgery at this time.  Alternatively, medical management of elevated testosterone secondary to ovarian hyperthecosis could be considered.  Would suggest that this be discussed with her endocrinologist for them to weigh in on pros and cons of this approach.  Plan: Check testosterone level today.  If normal, then less likely consistent with ovarian hyperthecosis and would consider repeat MRI in 3 to 6 months.  If testosterone is elevated, patient desires to explore potential medical management option and so would recommend follow-up with endocrinology to weigh in.  Alternatively, reviewed that I think patient would be an okay candidate for laparoscopic BSO.  Would get cardiac clearance prior to surgery however.  RTC pending testosterone level.  Catherine Flies, MD Gynecologic Oncology   Medical Decision Making I personally spent  TOTAL 30 minutes face-to-face and non-face-to-face in the care of this patient, which includes  all pre, intra, and post visit time on the date of service.   ----------------------- Reason for Visit: Follow-up   Interval History: No changes. Tolerated MRI well.  Patient reports that she has a history of adrenal hyperplasia.  Follows with Dr. Aldona Moon with [facility].  Thinks she may have been on something like spironolactone in the past and this may have been discontinued due to the blood sugar control, according to the patient.   Past Medical/Surgical History: Past Medical History:  Diagnosis Date   (HFpEF) heart failure with preserved ejection fraction (HCC)    Anticoagulated    plavix  and asa 81mg   ---- managed by cardiology   Aortic stenosis due to bicuspid aortic valve    per intraoperative TEE 11-30-2023  aortic bicuspid valve w/ mild stenosis,  area 1.46   Coronary artery disease 11/2022   cardiologist--- dr Abel Hoe;   NSTEMI 11-26-2022  cath w/ severe 2 vessel CAD ;   11-30-2022  s/p CABG x2   CTS (carpal tunnel syndrome)    Diverticulosis of colon    GERD (gastroesophageal reflux disease)    Heart murmur    had ECHO 01-2021   Hemorrhoids    interenal and external   History of adenomatous polyp of colon    History of non-ST elevation myocardial infarction (NSTEMI)    HTN (hypertension)    Hyperlipidemia, mixed    IDA (iron deficiency anemia)    Insulin  dependent type 2 diabetes mellitus [facility]) 1997   endocrinologist--- dr Catherine Moon;   dx 1997 DM2;  started insulin  in 2010   Mild nonproliferative diabetic retinopathy without macular edema associated with type 2 diabetes mellitus (HCC)    followed gy dr Alto Atta;   both eyes   PMB (postmenopausal bleeding)  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   LIMA--LAD;   RSVG--PLV  by dr lightfoot   Seasonal allergies    Thickened endometrium    Warts, genital    Wears glasses     Past Surgical History:  Procedure Laterality Date   COLONOSCOPY WITH PROPOFOL   02/17/2015   dr Lorain Robson   CORONARY ARTERY BYPASS GRAFT N/A  11/30/2022   Procedure: CORONARY ARTERY BYPASS GRAFTING X 2 , USING LEFT INTERNAL MAMMARY ARTERY AND ENDOSCOPICALLY HARVESTED RIGHT SAPHENOUS VEIN GRAFT;  Surgeon: Hilarie Lovely, MD;  Location: [facility] OR;  Service: Open Heart Surgery;  Laterality: N/A;   CYST EXCISION Right 03/26/2021   Procedure: RIGHT AXILLARY SEBACEOUS CYST EXCISION;  Surgeon: Enid Harry, MD;  Location: [facility];  Service: General;  Laterality: Right;  LOCAL   DILATATION & CURETTAGE/HYSTEROSCOPY WITH MYOSURE N/A 03/17/2023   Procedure: DILATATION & CURETTAGE/HYSTEROSCOPY WITH MYOSURE;  Surgeon: D'Iorio, Tresa Frohlich, MD;  Location: [facility] OR;  Service: Gynecology;  Laterality: N/A;  [facility]   DILATION AND CURETTAGE OF UTERUS  1977   LEFT HEART CATH AND CORONARY ANGIOGRAPHY N/A 11/26/2022   Procedure: LEFT HEART CATH AND CORONARY ANGIOGRAPHY;  Surgeon: Wenona Hamilton, MD;  Location: [facility] INVASIVE CV [facility];  Service: Cardiovascular;  Laterality: N/A;   TEE WITHOUT CARDIOVERSION N/A 11/30/2022   Procedure: TRANSESOPHAGEAL ECHOCARDIOGRAM (TEE);  Surgeon: Hilarie Lovely, MD;  Location: [facility] OR;  Service: Open Heart Surgery;  Laterality: N/A;   WISDOM TOOTH EXTRACTION      Family History  Problem Relation Age of Onset   Breast cancer Mother    Heart disease Mother    Coronary artery disease Father        starting in his 42s. A bypass and mitral infarction   Heart disease Father        Coronary Disease, bypass and mitral infarction   Lung cancer Maternal Grandfather        Forensic scientist   Colon cancer Paternal Grandmother    Prostate cancer Neg Hx    Pancreatic cancer Neg Hx    Ovarian cancer Neg Hx    Endometrial cancer Neg Hx     Social History   Socioeconomic History   Marital status: Married    Spouse name: Not on file   Number of children: Not on file   Years of education: Not on file   Highest education level: Associate degree: academic program  Occupational History   Not on file  Tobacco Use    Smoking status: Former    Current packs/day: 0.00    Average packs/day: 1 pack/day for 26.0 years (26.0 ttl pk-yrs)    Types: Cigarettes    Start date: 08/12/1981    Quit date: 08/13/2007    Years since quitting: 15.7   Smokeless tobacco: Never   Tobacco comments:     She smoked one pack per day for 26 years but quit in 2009.  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  She smoked one pack per day for 26 years but quit in 2009.         Social Drivers of Health   Financial Resource Strain: Low Risk  (11/25/2022)  Overall Financial Resource Strain (CARDIA)    Difficulty of Paying Living Expenses: Not hard at all  Food Insecurity: No Food Insecurity (01/11/2023)   Hunger Vital Sign    Worried About Running Out of Food in the Last Year: Never true    Ran Out of Food in the Last Year: Never true  Transportation Needs: No Transportation Needs (01/11/2023)   PRAPARE - Administrator, Civil Service (Medical): No    Lack of Transportation (Non-Medical): No  Physical Activity: Unknown (11/25/2022)   Exercise Vital Sign    Days of Exercise per Week: Patient declined    Minutes of Exercise per Session: Not on file  Stress: No Stress Concern Present (11/25/2022)   [employer] of Occupational Health - Occupational Stress Questionnaire    Feeling of Stress : Only a little  Social Connections: Socially Isolated (11/25/2022)   Social Connection and Isolation Panel [NHANES]    Frequency of Communication with Friends and Family: Once a week    Frequency of Social Gatherings with Friends and Family: Never    Attends Religious Services: Never    Diplomatic Services operational officer: No    Attends Engineer, structural: Not on  file    Marital Status: Married    Current Medications:  Current Outpatient Medications:    acetaminophen  (TYLENOL ) 500 MG tablet, Take 1,000 mg by mouth every 6 (six) hours as needed for mild pain, moderate pain, fever or headache., Disp: , Rfl:    amLODipine  (NORVASC ) 5 MG tablet, Take 1 tablet (5 mg total) by mouth daily. (Patient taking differently: Take 5 mg by mouth daily after lunch.), Disp: 90 tablet, Rfl: 3   aspirin  81 MG EC tablet, Take 81 mg by mouth daily after lunch., Disp: , Rfl:    atorvastatin  (LIPITOR ) 80 MG tablet, TAKE 1 TABLET (80 MG TOTAL) BY MOUTH DAILY. DUE FOR PHYSICAL. (Patient taking differently: Take 80 mg by mouth every evening. Due for physical.), Disp: 90 tablet, Rfl: 1   Black Elderberry 1000 MG CAPS, Take 1,000 mg by mouth daily after lunch., Disp: , Rfl:    calcium  carbonate (OSCAL) 1500 (600 Ca) MG TABS tablet, Take 1,200 mg of elemental calcium  by mouth daily after lunch., Disp: , Rfl:    cholecalciferol (VITAMIN D3) 25 MCG (1000 UNIT) tablet, Take 1,000 Units by mouth daily after lunch., Disp: , Rfl:    clopidogrel  (PLAVIX ) 75 MG tablet, Take 1 tablet (75 mg total) by mouth daily. (Patient taking differently: Take 75 mg by mouth daily after lunch.), Disp: 90 tablet, Rfl: 3   Continuous Glucose Sensor (FREESTYLE LIBRE 3 PLUS SENSOR) MISC, USe as directed every 14 (fourteen) days., Disp: 6 each, Rfl: 3   Cranberry 4200 MG CAPS, Take 8,400 mg by mouth daily after lunch., Disp: , Rfl:    ezetimibe  (ZETIA ) 10 MG tablet, Take 1 tablet (10 mg total) by mouth daily. (Patient taking differently: Take 10 mg by mouth daily after lunch.), Disp: 90 tablet, Rfl: 3   famotidine (PEPCID) 10 MG tablet, Take 10 mg by mouth daily after lunch., Disp: , Rfl:    ferrous sulfate  325 (65 FE) MG tablet, Take 325 mg by mouth daily after lunch., Disp: , Rfl:    fexofenadine (ALLEGRA) 180 MG tablet, Take 180 mg by mouth daily after lunch., Disp: , Rfl:    Ginger, Zingiber officinalis,  (GINGER ROOT) 550 MG CAPS, Take 550 mg by mouth daily after lunch., Disp: , Rfl:  Green Tea 315 MG CAPS, Take 3,015 mg by mouth daily after lunch., Disp: , Rfl:    ibuprofen (ADVIL) 200 MG tablet, Take 400 mg by mouth every 6 (six) hours as needed for moderate pain (pain score 4-6) or mild pain (pain score 1-3)., Disp: , Rfl:    Insulin  Pen Needle 32G X 4 MM MISC, Use 4x a day, Disp: 400 each, Rfl: 3   insulin  regular human CONCENTRATED (HUMULIN  R U-500 KWIKPEN) 500 UNIT/ML KwikPen, Inject under skin up to 200 units insulin  a day as advised (Patient taking differently: Inject 60-80 Units into the skin See admin instructions. Take 80 units in the morning ( pt stated actual for her is after lunch is her breakfast) and 60 units lunch ( actual time 7:30pm--8:30pm)  and 60 units at dinner  (actual time ,  12:30 am)), Disp: 36 mL, Rfl: 2   loratadine  (CLARITIN ) 10 MG tablet, Take 10 mg by mouth daily after lunch., Disp: , Rfl:    magnesium  oxide (MAG-OX) 400 MG tablet, Take 400 mg by mouth daily after lunch., Disp: , Rfl:    metoprolol  tartrate (LOPRESSOR ) 50 MG tablet, Take 1 tablet (50 mg total) by mouth 2 (two) times daily. (Patient taking differently: Take 50 mg by mouth 2 (two) times daily. After lunch and evening), Disp: 180 tablet, Rfl: 2   Multiple Vitamin (MULTIVITAMIN WITH MINERALS) TABS tablet, Take 1 tablet by mouth daily after lunch., Disp: , Rfl:    Multiple Vitamins-Minerals (HAIR SKIN & NAILS) TABS, Take 1 tablet by mouth daily after lunch., Disp: , Rfl:    Multiple Vitamins-Minerals (PRESERVISION AREDS PO), Take 1 capsule by mouth daily after lunch., Disp: , Rfl:    Potassium 99 MG TABS, Take 1 tablet (99 mg total) by mouth daily. (Patient taking differently: Take 1 tablet by mouth daily after lunch.), Disp: , Rfl:    Probiotic Product (PROBIOTIC DAILY PO), Take by mouth., Disp: , Rfl:    sodium chloride  (OCEAN) 0.65 % SOLN nasal spray, Place 1 spray into both nostrils daily as needed for  congestion., Disp: , Rfl:    tirzepatide  (MOUNJARO ) 10 MG/0.5ML Pen, Inject 10 mg into the skin once a week. (Patient taking differently: Inject 10 mg into the skin once a week. Thursday 's), Disp: 6 mL, Rfl: 3   zinc gluconate 50 MG tablet, Take 50 mg by mouth daily after lunch., Disp: , Rfl:   Review of Symptoms: Complete 10-system review is negative except as above in Interval History.  Physical Exam: BP (!) 132/52 (BP Location: Left Arm, Patient Position: Sitting)   Pulse 82   Temp 98.4 F (36.9 C) (Oral)   Resp 18   Ht 5\' 6"  (1.676 m)   Wt 222 lb (100.7 kg)   SpO2 98%   BMI 35.83 kg/m  General: Alert, oriented, no acute distress. HEENT: Normocephalic, atraumatic. Receeding hairline. Facial hirsutism, shaved Chest: Normal work of breathing.  Abdomen: central adiposity  Extremities: Grossly normal range of motion.  Warm, well perfused.  No edema bilaterally.   Laboratory & Radiologic Studies: MR Pelvis W Wo Contrast May 13, 2023  Narrative CLINICAL DATA:  Adnexal mass.  Postmenopausal female.  EXAM: MRI PELVIS WITHOUT AND WITH CONTRAST  TECHNIQUE: Multiplanar multisequence MR imaging of the pelvis was performed both before and after administration of intravenous contrast.  CONTRAST:  10mL GADAVIST  GADOBUTROL  1 MMOL/ML IV SOLN  COMPARISON:  None Available.  FINDINGS: Lower Urinary Tract: No urinary bladder or urethral abnormality identified.  Bowel:  Sigmoid diverticulosis, without signs of diverticulitis.  Vascular/Lymphatic: Unremarkable. No pathologically enlarged pelvic lymph nodes identified.  Reproductive:  -- Uterus: Measures 8.1 x 2.8 by 5.6 cm. Several tiny less than 1 cm intramural and subserosal fibroids are seen. No abnormal endometrial thickening or focal endometrial lesions identified. Cervix and vagina are unremarkable.  -- Right ovary: Enlarged, measuring 5.8 x 4.6 x 3.3 cm. Prominent T2 hypointense stromal tissue is seen centrally, with  numerous tiny sub-cm peripheral cysts.  -- Left ovary: Enlarged, measuring 6.5 x 5.5 by 2.5 cm. Prominent T2 hypointense stromal tissue is seen centrally, with numerous tiny sub-cm peripheral cysts.  Other: No peritoneal thickening or abnormal free fluid.  Musculoskeletal:  Unremarkable.  IMPRESSION: Bilateral ovarian enlargement, with prominent central T2 hypointense stromal tissue and numerous tiny peripheral cysts. These findings are highly suspicious for ovarian hyperthecosis, with ovarian fibroma or fibrothecoma considered less likely. Suggest correlation with biochemical testing.  Several tiny sub-cm uterine fibroids.  Sigmoid diverticulosis, without radiographic evidence of diverticulitis.   Electronically Signed By: Marlyce Sine M.D. On: 05/04/2023 16:42

## 2023-05-16 NOTE — Patient Instructions (Signed)
 It was a pleasure to see you in clinic today. - Testerone level check - If normal, repeat MRI in 3-21mo and follow-up - If elevated, discuss medical options with endocrinologist versus laparoscopic removal of tubes and ovaries.   Thank you very much for allowing me to provide care for you today.  I appreciate your confidence in choosing our Gynecologic Oncology team at [facility].  If you have any questions about your visit today please call our office or send us  a MyChart message and we will get back to you as soon as possible.

## 2023-05-17 LAB — TESTOSTERONE, FREE, TOTAL, SHBG
Sex Hormone Binding: 39.7 nmol/L (ref 17.3–125.0)
Testosterone, Free: 3.9 pg/mL (ref 0.0–4.2)
Testosterone: 176 ng/dL — ABNORMAL HIGH (ref 3–67)

## 2023-05-19 ENCOUNTER — Ambulatory Visit: Payer: BC Managed Care – PPO | Admitting: Cardiovascular Disease

## 2023-05-24 ENCOUNTER — Ambulatory Visit
Admission: RE | Admit: 2023-05-24 | Discharge: 2023-05-24 | Disposition: A | Discharge status: Home / Self Care | Payer: BC Managed Care – PPO | Source: Ambulatory Visit | Attending: Acute Care | Admitting: Acute Care

## 2023-05-24 ENCOUNTER — Telehealth: Payer: Self-pay | Admitting: Acute Care

## 2023-05-24 DIAGNOSIS — Z87891 Personal history of nicotine dependence: Secondary | ICD-10-CM

## 2023-05-24 DIAGNOSIS — Z122 Encounter for screening for malignant neoplasm of respiratory organs: Secondary | ICD-10-CM

## 2023-05-24 NOTE — Telephone Encounter (Signed)
 Returned call to patient regarding she went for LDCT today and was told could not complete due to 16 years as non smoker.  Upon review of chart, LDCT appt was scheduled Dec 2024, which was the last year for 15 years non smoker.  The appt was rescheduled at some point for May 2025.  Throwing the appt into year 2025 made it 16 years as non smoker.  Left Patient a VM to explain changing the appt move her smoking history to another year, which is the cause of the cancelled appt when she arrived at the imaging.  Also left our direct number if questions.

## 2023-06-02 ENCOUNTER — Telehealth: Payer: Self-pay

## 2023-06-02 NOTE — Telephone Encounter (Signed)
 Received a message from Richardine Chancy  She saw this patient on 05/16/23 for Adnexal Cyst and Ovarian Hyperthecosis. She states that she did some lab work on the patient and her Testosterone  came back elevated and wants to know should this patient proceed with Surgery since she believes the Ovaries are causing this increase in Testosterone .  She is aware that you are out of the office.  Her cell number is [phone]

## 2023-06-03 NOTE — Telephone Encounter (Signed)
 J, I am following her just for diabetes, but with active uterine bleeding and multiple ovarian cyst (per review of the chart), i would agree with Obgyn about the ovarian surgery (BSO). If the pt. Wants to come and discuss with me first, that is fine, but as of now, she is cleared for surgery from the diabetes point of view based on the last HbA1c. Ty! C

## 2023-06-03 NOTE — Telephone Encounter (Signed)
 I called and spoke with [name] and I advised her of this message below and she states that she is going to reach out to the patient and talk with her.   Catherine Moon

## 2023-06-07 ENCOUNTER — Telehealth: Payer: Self-pay | Admitting: Psychiatry

## 2023-06-07 NOTE — Telephone Encounter (Signed)
 Attempted to call pt to discuss results and plan. No answer. Will try again later.

## 2023-06-13 ENCOUNTER — Telehealth: Payer: Self-pay | Admitting: Psychiatry

## 2023-06-13 ENCOUNTER — Encounter: Payer: Self-pay | Admitting: Psychiatry

## 2023-06-13 NOTE — Telephone Encounter (Signed)
 Attempted to call pt again regarding testosterone  and her planned endocrinology follow-up. No answer. Left generic VM. Sent [employer]. Will get her scheduled for follow-up in our clinic.

## 2023-06-14 ENCOUNTER — Telehealth: Payer: Self-pay

## 2023-06-14 NOTE — Telephone Encounter (Signed)
 Per Dr.Newton request:  LVM for Ms.Gabler to call our office regarding scheduling a follow up appointment with Dr. Daisey Dryer.

## 2023-06-15 ENCOUNTER — Telehealth: Payer: Self-pay

## 2023-06-15 NOTE — Telephone Encounter (Signed)
 Ms. Kosta called office today stating she needs to reschedule her appointment with Dr.Newton on 7/28. She requested 8/4.  Appointment on 8/4 @ 2:15.

## 2023-06-15 NOTE — Telephone Encounter (Signed)
 Spoke with Ms. Shearon and scheduled an appt.with Dr. Daisey Dryer for Monday, July 28 th at 2:30 pm. Pt agreed to date and time. Pt states she prefers a Tuesday or Wednesday because she and her husband share 1 vehicle and he works every Monday. Advised patient that Dr. Daisey Dryer is only here on Mondays. Pt states,  I will have to make it work.

## 2023-06-16 ENCOUNTER — Ambulatory Visit: Payer: BC Managed Care – PPO | Admitting: Internal Medicine

## 2023-06-20 ENCOUNTER — Other Ambulatory Visit: Payer: Self-pay

## 2023-06-20 ENCOUNTER — Other Ambulatory Visit (HOSPITAL_COMMUNITY): Payer: Self-pay

## 2023-06-20 ENCOUNTER — Encounter: Payer: Self-pay | Admitting: Pharmacist

## 2023-06-21 ENCOUNTER — Encounter: Payer: Self-pay | Admitting: Family Medicine

## 2023-06-21 ENCOUNTER — Other Ambulatory Visit: Payer: Self-pay

## 2023-06-21 ENCOUNTER — Ambulatory Visit (INDEPENDENT_AMBULATORY_CARE_PROVIDER_SITE_OTHER): Payer: BC Managed Care – PPO | Admitting: Family Medicine

## 2023-06-21 VITALS — BP 126/60 | HR 103 | Temp 99.0°F | Resp 16 | Ht 66.0 in | Wt 223.4 lb

## 2023-06-21 DIAGNOSIS — Z23 Encounter for immunization: Secondary | ICD-10-CM

## 2023-06-21 DIAGNOSIS — I10 Essential (primary) hypertension: Secondary | ICD-10-CM

## 2023-06-21 DIAGNOSIS — E785 Hyperlipidemia, unspecified: Secondary | ICD-10-CM

## 2023-06-21 DIAGNOSIS — E1169 Type 2 diabetes mellitus with other specified complication: Secondary | ICD-10-CM

## 2023-06-21 DIAGNOSIS — D500 Iron deficiency anemia secondary to blood loss (chronic): Secondary | ICD-10-CM | POA: Diagnosis not present

## 2023-06-21 DIAGNOSIS — Z0000 Encounter for general adult medical examination without abnormal findings: Secondary | ICD-10-CM | POA: Diagnosis not present

## 2023-06-21 LAB — COMPREHENSIVE METABOLIC PANEL WITH GFR
ALT: 11 U/L (ref 0–35)
AST: 14 U/L (ref 0–37)
Albumin: 3.9 g/dL (ref 3.5–5.2)
Alkaline Phosphatase: 84 U/L (ref 39–117)
BUN: 17 mg/dL (ref 6–23)
CO2: 26 meq/L (ref 19–32)
Calcium: 9.5 mg/dL (ref 8.4–10.5)
Chloride: 100 meq/L (ref 96–112)
Creatinine, Ser: 0.84 mg/dL (ref 0.40–1.20)
GFR: 74.48 mL/min (ref >60.00)
Glucose, Bld: 164 mg/dL — ABNORMAL HIGH (ref 70–99)
Potassium: 3.9 meq/L (ref 3.5–5.1)
Sodium: 137 meq/L (ref 135–145)
Total Bilirubin: 0.7 mg/dL (ref 0.2–1.2)
Total Protein: 7.8 g/dL (ref 6.0–8.3)

## 2023-06-21 LAB — CBC
HCT: 38.8 % (ref 36.0–46.0)
Hemoglobin: 12.7 g/dL (ref 12.0–15.0)
MCHC: 32.7 g/dL (ref 30.0–36.0)
MCV: 87 fl (ref 78.0–100.0)
Platelets: 423 10*3/uL — ABNORMAL HIGH (ref 150.0–400.0)
RBC: 4.46 Mil/uL (ref 3.87–5.11)
RDW: 15.2 % (ref 11.5–15.5)
WBC: 14.6 10*3/uL — ABNORMAL HIGH (ref 4.0–10.5)

## 2023-06-21 NOTE — Patient Instructions (Addendum)
 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 Do not use My Chart to request refills or for acute issues that need immediate attention. If you send a my chart message, it may take a few days to be addressed, specially if I am not in the office.  Please be sure medication list is accurate. If a new problem present, please set up appointment sooner than planned today.  Health Maintenance, Female Adopting a healthy lifestyle and getting preventive care are important in promoting health and wellness. Ask your health care provider about: The right schedule for you to have regular tests and exams. Things you can do on your own to prevent diseases and keep yourself healthy. What should I know about diet, weight, and exercise? Eat a healthy diet  Eat a diet that includes plenty of vegetables, fruits, low-fat dairy products, and lean protein. Do not eat a lot of foods that are high in solid fats, added sugars, or sodium. Maintain a healthy weight Body mass index (BMI) is used to identify weight problems. It estimates body fat based on height and weight. Your health care provider can help determine your BMI and help you achieve or maintain a healthy weight. Get regular exercise Get regular exercise. This is one of the most important things you can do for your health. Most adults should: Exercise for at least 150 minutes each week. The exercise should increase your heart rate and make you sweat (moderate-intensity exercise). Do  strengthening exercises at least twice a week. This is in addition to the moderate-intensity exercise. Spend less time sitting. Even light physical activity can be beneficial. Watch cholesterol and blood lipids Have your blood tested for lipids and cholesterol at 62 years of age, then have this test every 5 years. Have your cholesterol levels checked more often if: Your lipid or cholesterol levels are high. You are older than 62 years of age. You are at high risk for heart disease. What should I know about cancer screening? Depending on your health history and family history, you may need to have cancer screening at various ages. This may include screening for: Breast cancer. Cervical cancer. Colorectal cancer. Skin cancer. Lung cancer. What should I know about heart disease, diabetes, and high blood pressure? Blood pressure and heart disease High blood pressure causes heart disease and increases the risk of stroke. This is more likely to develop in people who have high blood pressure readings or are overweight. Have your blood pressure checked: Every 3-5 years if you are 7-35 years of age. Every year if you are 2 years old or older. Diabetes Have regular diabetes screenings. This checks your fasting blood sugar level. Have the screening done: Once every three years after age 44 if you are at a normal weight and have a low risk for diabetes. More often and at a younger age if you are overweight or have a high risk for diabetes. What should I know about preventing infection? Hepatitis B If you have a higher risk for hepatitis B, you should be screened for this virus. Talk with your  health care provider to find out if you are at risk for hepatitis B infection. Hepatitis C Testing is recommended for: Everyone born from [birth year] through 1964. Anyone with known risk factors for hepatitis C. Sexually transmitted infections (STIs) Get screened for STIs, including gonorrhea and chlamydia,  if: You are sexually active and are younger than 62 years of age. You are older than 62 years of age and your health care provider tells you that you are at risk for this type of infection. Your sexual activity has changed since you were last screened, and you are at increased risk for chlamydia or gonorrhea. Ask your health care provider if you are at risk. Ask your health care provider about whether you are at high risk for HIV. Your health care provider may recommend a prescription medicine to help prevent HIV infection. If you choose to take medicine to prevent HIV, you should first get tested for HIV. You should then be tested every 3 months for as long as you are taking the medicine. Pregnancy If you are about to stop having your period (premenopausal) and you may become pregnant, seek counseling before you get pregnant. Take 400 to 800 micrograms (mcg) of folic acid every day if you become pregnant. Ask for birth control (contraception) if you want to prevent pregnancy. Osteoporosis and menopause Osteoporosis is a disease in which the bones lose minerals and strength with aging. This can result in bone fractures. If you are 48 years old or older, or if you are at risk for osteoporosis and fractures, ask your health care provider if you should: Be screened for bone loss. Take a calcium  or vitamin D supplement to lower your risk of fractures. Be given hormone replacement therapy (HRT) to treat symptoms of menopause. Follow these instructions at home: Alcohol use Do not drink alcohol if: Your health care provider tells you not to drink. You are pregnant, may be pregnant, or are planning to become pregnant. If you drink alcohol: Limit how much you have to: 0-1 drink a day. Know how much alcohol is in your drink. In the [place] one drink equals one 12 oz bottle of beer (355 mL), one 5 oz glass of wine (148 mL), or one 1 oz glass of hard liquor (44 mL). Lifestyle Do not use any products that  contain nicotine or tobacco. These products include cigarettes, chewing tobacco, and vaping devices, such as e-cigarettes. If you need help quitting, ask your health care provider. Do not use street drugs. Do not share needles. Ask your health care provider for help if you need support or information about quitting drugs. General instructions Schedule regular health, dental, and eye exams. Stay current with your vaccines. Tell your health care provider if: You often feel depressed. You have ever been abused or do not feel safe at home. Summary Adopting a healthy lifestyle and getting preventive care are important in promoting health and wellness. Follow your health care provider's instructions about healthy diet, exercising, and getting tested or screened for diseases. Follow your health care provider's instructions on monitoring your cholesterol and blood pressure. This information is not intended to replace advice given to you by your health care provider. Make sure you discuss any questions you have with your health care provider. Document Revised: 05/12/2020 Document Reviewed: 05/12/2020 [employer]vier Patient Education  2024 [employer].

## 2023-06-21 NOTE — Progress Notes (Signed)
 HPI: [name] is a 63 y.o. female with a PMHx significant for HFpEF, HTN, NSTEMI, S/P CABG x2, HLD, and DM II, who is here today for her routine physical.  Last CPE: 12/10/2020  Exercise: Patient admits she has not been exercising regularly.  Diet: She is trying to modify her diet to avoid carbohydrates and sodium.  Sleep: 8-10 hours per night. Mention she has to get up to urinate every couple of hours.  Alcohol Use: none Smoking: She quit smoking in 2009. Used to smoke 26 packs per year.  Vision: UTD on routine vision care. Dental: She has not been to the dentist lately but has a provider.  Follows regularly with gynecology. Also recently saw gynecology-oncology on 5/12.   Immunization History  Administered Date(s) Administered   Influenza Split 09/23/2011, 10/18/2021   Influenza Whole 12/18/2009   Influenza,inj,Quad PF,6+ Mos 09/25/2013, 11/01/2014, 01/15/2016, 10/12/2016, 12/07/2017, 12/10/2020   PFIZER(Purple Top)SARS-COV-2 Vaccination 03/29/2019, 04/24/2019   PNEUMOCOCCAL CONJUGATE-20 06/21/2023   Pneumococcal Polysaccharide-23 12/18/2009, 01/15/2016   Td 02/04/2006   Tdap 05/09/2013, 06/21/2023   Unspecified SARS-COV-2 Vaccination 01/09/2020   Zoster Recombinant(Shingrix) 08/20/2018, 11/28/2018   Health Maintenance  Topic Date Due   Cervical Cancer Screening (HPV/Pap Cotest)  03/14/2016   Colonoscopy  02/16/2018   MAMMOGRAM  08/07/2021   COVID-19 Vaccine (4 - 2024-25 season) 07/06/2023 (Originally 09/05/2022)   OPHTHALMOLOGY EXAM  07/22/2023   HEMOGLOBIN A1C  07/28/2023   INFLUENZA VACCINE  08/05/2023   Diabetic kidney evaluation - Urine ACR  09/14/2023   FOOT EXAM  09/14/2023   Diabetic kidney evaluation - eGFR measurement  03/16/2024   DTaP/Tdap/Td (4 - Td or Tdap) 06/20/2033   Pneumococcal Vaccine 61-29 Years old  Completed   Hepatitis C Screening  Completed   HIV Screening  Completed   Zoster Vaccines- Shingrix  Completed   HPV VACCINES  Aged  Out   Meningococcal B Vaccine  Aged Out   Lung Cancer Screening  Discontinued   Chronic medical problems:   Hypertension:  Medications: Currently on amlodipine  5 mg daily and metoprolol  tartrate 50 mg bid.  Negative for unusual or severe headache, exertional chest pain, dyspnea, or worsening edema.  Lab Results  Component Value Date   CREATININE 0.71 03/17/2023   BUN 17 03/17/2023   NA 141 03/17/2023   K 3.6 03/17/2023   CL 107 03/17/2023   CO2 27 03/17/2023   Hyperlipidemia: Currently on Ezetimibe  10 mg daily and atorvastatin  80 mg daily.   Lab Results  Component Value Date   CHOL 102 11/26/2022   HDL 36 (L) 11/26/2022   LDLCALC 42 11/26/2022   LDLDIRECT 137.0 12/10/2020   TRIG 122 11/26/2022   CHOLHDL 2.8 11/26/2022   Diabetes Mellitus II: Follows with endocrinology Currently on Mounjaro  10 mg weekly and Humulin  80 units at breakfast and 60 units lunch and dinner.   Lab Results  Component Value Date   HGBA1C 5.9 (A) 01/28/2023   Lab Results  Component Value Date   MICROALBUR 2.8 (H) 09/14/2022   Iron def anemia: She is on iron supplementation.     Latest Ref Rng & Units 12/21/2022    1:51 PM 12/03/2022    3:20 AM 12/02/2022    4:10 AM  CBC  WBC 4.0 - 10.5 K/uL 11.3  13.0  15.0   Hemoglobin 12.0 - 15.0 g/dL 78.2  95.6  9.5   Hematocrit 36.0 - 46.0 % 38.7  31.4  30.2   Platelets 150.0 - 400.0 K/uL  637.0  339  284    Review of Systems  Constitutional:  Positive for fatigue. Negative for activity change, appetite change and fever.  HENT:  Negative for mouth sores, sore throat and trouble swallowing.   Eyes:  Negative for redness and visual disturbance.  Respiratory:  Negative for cough, shortness of breath and wheezing.   Cardiovascular:  Negative for chest pain and leg swelling.  Gastrointestinal:  Negative for abdominal pain, blood in stool, nausea and vomiting.       No changes in bowel habits.  Endocrine: Negative for cold intolerance, heat  intolerance, polydipsia, polyphagia and polyuria.  Genitourinary:  Negative for decreased urine volume, dysuria and hematuria.  Musculoskeletal:  Negative for gait problem and myalgias.  Skin:  Negative for color change and rash.  Allergic/Immunologic: Positive for environmental allergies.  Neurological:  Negative for syncope, weakness and headaches.  Hematological:  Negative for adenopathy. Does not bruise/bleed easily.  Psychiatric/Behavioral:  Negative for confusion. The patient is not nervous/anxious.   All other systems reviewed and are negative.  Current Outpatient Medications on File Prior to Visit  Medication Sig Dispense Refill   acetaminophen  (TYLENOL ) 500 MG tablet Take 1,000 mg by mouth every 6 (six) hours as needed for mild pain, moderate pain, fever or headache.     amLODipine  (NORVASC ) 5 MG tablet Take 1 tablet (5 mg total) by mouth daily. (Patient taking differently: Take 5 mg by mouth daily after lunch.) 90 tablet 3   aspirin  81 MG EC tablet Take 81 mg by mouth daily after lunch.     atorvastatin  (LIPITOR ) 80 MG tablet TAKE 1 TABLET (80 MG TOTAL) BY MOUTH DAILY. DUE FOR PHYSICAL. (Patient taking differently: Take 80 mg by mouth every evening. Due for physical.) 90 tablet 1   Black Elderberry 1000 MG CAPS Take 1,000 mg by mouth daily after lunch.     calcium  carbonate (OSCAL) 1500 (600 Ca) MG TABS tablet Take 1,200 mg of elemental calcium  by mouth daily after lunch.     cholecalciferol (VITAMIN D3) 25 MCG (1000 UNIT) tablet Take 1,000 Units by mouth daily after lunch.     clopidogrel  (PLAVIX ) 75 MG tablet Take 1 tablet (75 mg total) by mouth daily. (Patient taking differently: Take 75 mg by mouth daily after lunch.) 90 tablet 3   Continuous Glucose Sensor (FREESTYLE LIBRE 3 PLUS SENSOR) MISC USe as directed every 14 (fourteen) days. 6 each 3   Cranberry 4200 MG CAPS Take 8,400 mg by mouth daily after lunch.     ezetimibe  (ZETIA ) 10 MG tablet Take 1 tablet (10 mg total) by mouth  daily. (Patient taking differently: Take 10 mg by mouth daily after lunch.) 90 tablet 3   famotidine (PEPCID) 10 MG tablet Take 10 mg by mouth daily after lunch.     ferrous sulfate  325 (65 FE) MG tablet Take 325 mg by mouth daily after lunch.     fexofenadine (ALLEGRA) 180 MG tablet Take 180 mg by mouth daily after lunch.     Ginger, Zingiber officinalis, (GINGER ROOT) 550 MG CAPS Take 550 mg by mouth daily after lunch.     Green Tea 315 MG CAPS Take 3,015 mg by mouth daily after lunch.     ibuprofen (ADVIL) 200 MG tablet Take 400 mg by mouth every 6 (six) hours as needed for moderate pain (pain score 4-6) or mild pain (pain score 1-3).     Insulin  Pen Needle 32G X 4 MM MISC Use 4x a day 400  each 3   insulin  regular human CONCENTRATED (HUMULIN  R U-500 KWIKPEN) 500 UNIT/ML KwikPen Inject under skin up to 200 units insulin  a day as advised (Patient taking differently: Inject 60-80 Units into the skin See admin instructions. Take 80 units in the morning ( pt stated actual for her is after lunch is her breakfast) and 60 units lunch ( actual time 7:30pm--8:30pm)  and 60 units at dinner  (actual time ,  12:30 am)) 36 mL 2   loratadine  (CLARITIN ) 10 MG tablet Take 10 mg by mouth daily after lunch.     magnesium  oxide (MAG-OX) 400 MG tablet Take 400 mg by mouth daily after lunch.     metoprolol  tartrate (LOPRESSOR ) 50 MG tablet Take 1 tablet (50 mg total) by mouth 2 (two) times daily. (Patient taking differently: Take 50 mg by mouth 2 (two) times daily. After lunch and evening) 180 tablet 2   Multiple Vitamin (MULTIVITAMIN WITH MINERALS) TABS tablet Take 1 tablet by mouth daily after lunch.     Multiple Vitamins-Minerals (HAIR SKIN & NAILS) TABS Take 1 tablet by mouth daily after lunch.     Multiple Vitamins-Minerals (PRESERVISION AREDS PO) Take 1 capsule by mouth daily after lunch.     Potassium 99 MG TABS Take 1 tablet (99 mg total) by mouth daily. (Patient taking differently: Take 1 tablet by mouth daily  after lunch.)     Probiotic Product (PROBIOTIC DAILY PO) Take by mouth.     sodium chloride  (OCEAN) 0.65 % SOLN nasal spray Place 1 spray into both nostrils daily as needed for congestion.     tirzepatide  (MOUNJARO ) 10 MG/0.5ML Pen Inject 10 mg into the skin once a week. (Patient taking differently: Inject 10 mg into the skin once a week. Thursday 's) 6 mL 3   zinc gluconate 50 MG tablet Take 50 mg by mouth daily after lunch.     No current facility-administered medications on file prior to visit.   Past Medical History:  Diagnosis Date   (HFpEF) heart failure with preserved ejection fraction (HCC)    Anticoagulated    plavix  and asa 81mg   ---- managed by cardiology   Aortic stenosis due to bicuspid aortic valve    per intraoperative TEE 11-30-2023  aortic bicuspid valve w/ mild stenosis,  area 1.46   Clotting disorder (HCC) 09/26/22   Postmenopausal bl   Coronary artery disease 11/2022   cardiologist--- dr Abel Hoe;   NSTEMI 11-26-2022  cath w/ severe 2 vessel CAD ;   11-30-2022  s/p CABG x2   CTS (carpal tunnel syndrome)    Diverticulosis of colon    GERD (gastroesophageal reflux disease)    Heart murmur    had ECHO 01-2021   Hemorrhoids    interenal and external   History of adenomatous polyp of colon    History of non-ST elevation myocardial infarction (NSTEMI)    HTN (hypertension)    Hyperlipidemia, mixed    IDA (iron deficiency anemia)    Insulin  dependent type 2 diabetes mellitus [facility]) 1997   endocrinologist--- dr Aldona Amel;   dx 1997 DM2;  started insulin  in 2010   Mild nonproliferative diabetic retinopathy without macular edema associated with type 2 diabetes mellitus (HCC)    followed gy dr Alto Atta;   both eyes   PMB (postmenopausal bleeding)    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   LIMA--LAD;   RSVG--PLV  by dr lightfoot   Seasonal allergies    Thickened endometrium    Warts, genital  Wears glasses    Past Surgical History:  Procedure Laterality Date   COLONOSCOPY WITH  PROPOFOL   02/17/2015   dr Lorain Robson   CORONARY ARTERY BYPASS GRAFT N/A 11/30/2022   Procedure: CORONARY ARTERY BYPASS GRAFTING X 2 , USING LEFT INTERNAL MAMMARY ARTERY AND ENDOSCOPICALLY HARVESTED RIGHT SAPHENOUS VEIN GRAFT;  Surgeon: Hilarie Lovely, MD;  Location: [facility] OR;  Service: Open Heart Surgery;  Laterality: N/A;   CYST EXCISION Right 03/26/2021   Procedure: RIGHT AXILLARY SEBACEOUS CYST EXCISION;  Surgeon: Enid Harry, MD;  Location: [facility];  Service: General;  Laterality: Right;  LOCAL   DILATATION & CURETTAGE/HYSTEROSCOPY WITH MYOSURE N/A 03/17/2023   Procedure: DILATATION & CURETTAGE/HYSTEROSCOPY WITH MYOSURE;  Surgeon: D'Iorio, Tresa Frohlich, MD;  Location: [facility] OR;  Service: Gynecology;  Laterality: N/A;  [facility]   DILATION AND CURETTAGE OF UTERUS  1977   LEFT HEART CATH AND CORONARY ANGIOGRAPHY N/A 11/26/2022   Procedure: LEFT HEART CATH AND CORONARY ANGIOGRAPHY;  Surgeon: Wenona Hamilton, MD;  Location: [facility];  Service: Cardiovascular;  Laterality: N/A;   TEE WITHOUT CARDIOVERSION N/A 11/30/2022   Procedure: TRANSESOPHAGEAL ECHOCARDIOGRAM (TEE);  Surgeon: Hilarie Lovely, MD;  Location: [facility];  Service: Open Heart Surgery;  Laterality: N/A;   WISDOM TOOTH EXTRACTION     No Known Allergies  Family History  Problem Relation Age of Onset   Breast cancer Mother    Heart disease Mother    Cancer Mother    Diabetes Mother    Coronary artery disease Father        starting in his 8s. A bypass and mitral infarction   Heart disease Father        Coronary Disease, bypass and mitral infarction   Lung cancer Maternal Grandfather        Forensic scientist   Colon cancer Paternal Grandmother    Diabetes Brother    Stroke Brother    Hypertension Brother    Hypertension Sister    Prostate cancer Neg Hx    Pancreatic cancer Neg Hx    Ovarian cancer Neg Hx    Endometrial cancer Neg Hx    Social History   Socioeconomic History   Marital status:  Married    Spouse name: Not on file   Number of children: Not on file   Years of education: Not on file   Highest education level: Associate degree: academic program  Occupational History   Not on file  Tobacco Use   Smoking status: Former    Current packs/day: 0.00    Average packs/day: 1 pack/day for 26.0 years (26.0 ttl pk-yrs)    Types: Cigarettes    Start date: 08/12/1981    Quit date: 08/13/2007    Years since quitting: 15.8   Smokeless tobacco: Never   Tobacco comments:     She smoked one pack per day for 26 years but quit in 2009.  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  She smoked one pack per day for 26 years but quit in 2009.         Social Drivers of Corporate investment banker Strain: Low Risk  (  06/16/2023)   Overall Financial Resource Strain (CARDIA)    Difficulty of Paying Living Expenses: Not hard at all  Food Insecurity: No Food Insecurity (06/16/2023)   Hunger Vital Sign    Worried About Running Out of Food in the Last Year: Never true    Ran Out of Food in the Last Year: Never true  Transportation Needs: No Transportation Needs (06/16/2023)   PRAPARE - Administrator, Civil Service (Medical): No    Lack of Transportation (Non-Medical): No  Physical Activity: Insufficiently Active (06/16/2023)   Exercise Vital Sign    Days of Exercise per Week: 2 days    Minutes of Exercise per Session: 10 min  Stress: Stress Concern Present (06/16/2023)   [employer] of Occupational Health - Occupational Stress Questionnaire    Feeling of Stress: To some extent  Social Connections: Moderately Isolated (06/16/2023)   Social Connection and Isolation Panel    Frequency of  Communication with Friends and Family: Three times a week    Frequency of Social Gatherings with Friends and Family: Never    Attends Religious Services: Never    Database administrator or Organizations: No    Attends Banker Meetings: Not on file    Marital Status: Married   Vitals:   06/21/23 1127  BP: 126/60  Pulse: (!) 103  Resp: 16  Temp: 99 F (37.2 C)  SpO2: 96%   Body mass index is 36.05 kg/m.  Wt Readings from Last 3 Encounters:  06/21/23 223 lb 6 oz (101.3 kg)  05/16/23 222 lb (100.7 kg)  04/25/23 218 lb 12.8 oz (99.2 kg)   Physical Exam Vitals and nursing note reviewed.  Constitutional:      General: She is not in acute distress.    Appearance: She is well-developed.  HENT:     Head: Normocephalic and atraumatic.     Right Ear: Hearing, tympanic membrane, ear canal and external ear normal.     Left Ear: Hearing, tympanic membrane, ear canal and external ear normal.     Mouth/Throat:     Mouth: Mucous membranes are moist.     Pharynx: Oropharynx is clear. Uvula midline.   Eyes:     Extraocular Movements: Extraocular movements intact.     Conjunctiva/sclera: Conjunctivae normal.     Pupils: Pupils are equal, round, and reactive to light.   Neck:     Thyroid : No thyroid  mass or thyromegaly.     Trachea: No tracheal deviation.   Cardiovascular:     Rate and Rhythm: Normal rate and regular rhythm.     Pulses:          Dorsalis pedis pulses are 2+ on the right side and 2+ on the left side.     Heart sounds: Murmur (Soft SEM LUSB) heard.     Comments: Trace pitting LE edema, bilateral. HR 100/min. Pulmonary:     Effort: Pulmonary effort is normal. No respiratory distress.     Breath sounds: Normal breath sounds.  Abdominal:     Palpations: Abdomen is soft. There is no hepatomegaly or mass.     Tenderness: There is no abdominal tenderness.  Genitourinary:    Comments: Deferred to gyn.  Musculoskeletal:     Comments: No signs of synovitis  appreciated.  Lymphadenopathy:     Cervical: No cervical adenopathy.     Upper Body:     Right upper body: No supraclavicular adenopathy.     Left upper body: No  supraclavicular adenopathy.   Skin:    General: Skin is warm.     Findings: No erythema or rash.   Neurological:     General: No focal deficit present.     Mental Status: She is alert and oriented to person, place, and time.     Cranial Nerves: No cranial nerve deficit.     Sensory: No sensory deficit.     Motor: No weakness.     Coordination: Coordination normal.     Gait: Gait normal.     Deep Tendon Reflexes:     Reflex Scores:      Bicep reflexes are 2+ on the right side and 2+ on the left side.      Patellar reflexes are 2+ on the right side and 2+ on the left side.  Psychiatric:        Mood and Affect: Mood and affect normal.    ASSESSMENT AND PLAN:  Catherine Moon was here today for her annual physical examination.  Orders Placed This Encounter  Procedures   Tdap vaccine greater than or equal to 7yo IM   Pneumococcal conjugate vaccine 20-valent (Prevnar 20)   CBC   Comprehensive metabolic panel with GFR  ***  Routine general medical examination at a health care facility Assessment & Plan: We discussed the importance of regular physical activity and healthy diet for prevention of chronic illness and/or complications. Preventive guidelines reviewed. Vaccination updated. Ca++ and vit D supplementation to continue. Female preventive care with her gyn.  Over due for mammogram and colonoscopy, stressed the importance of scheduling these, she has difficulty taking time from work. Next CPE in a year.   Essential hypertension Assessment & Plan: BP adequately controlled. Continue amlodipine  5 mg daily and metoprolol  titrate 50 mg bid as well as low-salt diet. Monitor blood pressure at home.  Orders: -     Comprehensive metabolic panel with GFR; Future  Hyperlipidemia associated with type  2 diabetes mellitus (HCC) Assessment & Plan: Currently on atorvastatin  80 mg daily and ezetimibe  10 mg daily. Last LDL 42 in 11/2022.   Iron deficiency anemia due to chronic blood loss Assessment & Plan: Caused by DUB, which has improved. Continue iron supplementation. She is overdue for colonoscopy, strongly recommend arranging appointment. Further recommendation will be given according to CBC result.  Orders: -     CBC; Future  Immunization due -     Tdap vaccine greater than or equal to 7yo IM -     Pneumococcal conjugate vaccine 20-valent   Return in 1 year (on 06/20/2024) for CPE, Labs.  I, Catherine Moon, acting as a scribe for Catherine Tomassi [place], MD., have documented all relevant documentation on the behalf of Catherine Smalling [place], MD, as directed by  Catherine Ramnauth [place], MD while in the presence of Catherine Mccurley [place], MD.   I, Catherine Rexrode [place], MD, have reviewed all documentation for this visit. The documentation on 06/21/23 for the exam, diagnosis, procedures, and orders are all accurate and complete.  Catherine Carvey G. [place], MD  [facility]. [place] office.

## 2023-06-21 NOTE — Assessment & Plan Note (Signed)
 Caused by DUB, which has improved. Continue iron supplementation. She is overdue for colonoscopy, strongly recommend arranging appointment. Further recommendation will be given according to CBC result.

## 2023-06-21 NOTE — Assessment & Plan Note (Signed)
 We discussed the importance of regular physical activity and healthy diet for prevention of chronic illness and/or complications. Preventive guidelines reviewed. Vaccination updated. Ca++ and vit D supplementation to continue. Female preventive care with her gyn.  Over due for mammogram and colonoscopy, stressed the importance of scheduling these, she has difficulty taking time from work. Next CPE in a year.

## 2023-06-21 NOTE — Assessment & Plan Note (Signed)
 BP adequately controlled. Continue amlodipine  5 mg daily and metoprolol  titrate 50 mg bid as well as low-salt diet. Monitor blood pressure at home.

## 2023-06-21 NOTE — Assessment & Plan Note (Signed)
 Currently on atorvastatin  80 mg daily and ezetimibe  10 mg daily. Last LDL 42 in 11/2022.

## 2023-06-23 ENCOUNTER — Ambulatory Visit: Payer: Self-pay | Admitting: Family Medicine

## 2023-07-21 ENCOUNTER — Encounter: Payer: Self-pay | Admitting: Internal Medicine

## 2023-07-22 ENCOUNTER — Telehealth (HOSPITAL_COMMUNITY): Payer: Self-pay | Admitting: Pharmacy Technician

## 2023-07-22 ENCOUNTER — Other Ambulatory Visit (HOSPITAL_COMMUNITY): Payer: Self-pay

## 2023-07-22 ENCOUNTER — Telehealth: Payer: Self-pay

## 2023-07-22 ENCOUNTER — Encounter (HOSPITAL_COMMUNITY): Payer: Self-pay

## 2023-07-22 NOTE — Telephone Encounter (Signed)
 Pt needs a PA for Mounjaro  through [employer]

## 2023-07-23 ENCOUNTER — Other Ambulatory Visit (HOSPITAL_COMMUNITY): Payer: Self-pay

## 2023-07-25 ENCOUNTER — Telehealth: Payer: Self-pay

## 2023-07-25 ENCOUNTER — Other Ambulatory Visit (HOSPITAL_COMMUNITY): Payer: Self-pay

## 2023-07-25 NOTE — Telephone Encounter (Signed)
 PA request has been Received. New Encounter has been or will be created for follow up. For additional info see Pharmacy Prior Auth telephone encounter from 07/25/23.

## 2023-07-25 NOTE — Telephone Encounter (Signed)
 Pharmacy Patient Advocate Encounter   Received notification from Pt Calls Messages that prior authorization for Mounjaro  10mg  is required/requested.   Insurance verification completed.   The patient is insured through [employer] .   Per test claim: PA required; PA submitted to above mentioned insurance via CoverMyMeds Key/confirmation #/EOC [facility] Status is pending

## 2023-07-26 ENCOUNTER — Other Ambulatory Visit (HOSPITAL_COMMUNITY): Payer: Self-pay

## 2023-07-28 ENCOUNTER — Other Ambulatory Visit (HOSPITAL_COMMUNITY): Payer: Self-pay

## 2023-07-29 NOTE — Telephone Encounter (Signed)
 Pharmacy Patient Advocate Encounter  Received notification from [employer] that Prior Authorization for Mounjaro  10mg  has been APPROVED from 07/29/23 to 01/03/2098

## 2023-08-01 ENCOUNTER — Ambulatory Visit: Admitting: Psychiatry

## 2023-08-03 ENCOUNTER — Other Ambulatory Visit (HOSPITAL_COMMUNITY): Payer: Self-pay

## 2023-08-04 ENCOUNTER — Other Ambulatory Visit (HOSPITAL_COMMUNITY): Payer: Self-pay

## 2023-08-06 ENCOUNTER — Other Ambulatory Visit (HOSPITAL_COMMUNITY): Payer: Self-pay

## 2023-08-08 ENCOUNTER — Inpatient Hospital Stay: Admitting: Gynecologic Oncology

## 2023-08-08 ENCOUNTER — Inpatient Hospital Stay: Attending: Psychiatry | Admitting: Psychiatry

## 2023-08-08 ENCOUNTER — Encounter: Payer: Self-pay | Admitting: Psychiatry

## 2023-08-08 VITALS — BP 130/62 | HR 76 | Temp 98.4°F | Resp 20 | Wt 230.0 lb

## 2023-08-08 DIAGNOSIS — Z780 Asymptomatic menopausal state: Secondary | ICD-10-CM | POA: Insufficient documentation

## 2023-08-08 DIAGNOSIS — R7989 Other specified abnormal findings of blood chemistry: Secondary | ICD-10-CM | POA: Insufficient documentation

## 2023-08-08 DIAGNOSIS — N83202 Unspecified ovarian cyst, left side: Secondary | ICD-10-CM | POA: Diagnosis not present

## 2023-08-08 DIAGNOSIS — Z951 Presence of aortocoronary bypass graft: Secondary | ICD-10-CM | POA: Insufficient documentation

## 2023-08-08 DIAGNOSIS — N83201 Unspecified ovarian cyst, right side: Secondary | ICD-10-CM | POA: Insufficient documentation

## 2023-08-08 DIAGNOSIS — Z794 Long term (current) use of insulin: Secondary | ICD-10-CM | POA: Insufficient documentation

## 2023-08-08 DIAGNOSIS — Z7982 Long term (current) use of aspirin: Secondary | ICD-10-CM | POA: Insufficient documentation

## 2023-08-08 DIAGNOSIS — Z87891 Personal history of nicotine dependence: Secondary | ICD-10-CM | POA: Insufficient documentation

## 2023-08-08 DIAGNOSIS — E119 Type 2 diabetes mellitus without complications: Secondary | ICD-10-CM | POA: Insufficient documentation

## 2023-08-08 DIAGNOSIS — I2510 Atherosclerotic heart disease of native coronary artery without angina pectoris: Secondary | ICD-10-CM | POA: Diagnosis not present

## 2023-08-08 DIAGNOSIS — E288 Other ovarian dysfunction: Secondary | ICD-10-CM

## 2023-08-08 DIAGNOSIS — R9389 Abnormal findings on diagnostic imaging of other specified body structures: Secondary | ICD-10-CM | POA: Insufficient documentation

## 2023-08-08 DIAGNOSIS — N949 Unspecified condition associated with female genital organs and menstrual cycle: Secondary | ICD-10-CM

## 2023-08-08 NOTE — Patient Instructions (Addendum)
 Preparing for your Surgery  Plan for surgery with Dr. Hoy Masters at [facility]. You will be scheduled for robotic assisted laparoscopic bilateral salpingo-oophorectomy (removal of the ovaries and fallopian tubes through small incisions).   We will need to obtain cardiac clearance before surgery.  We will plan on touching base after your visit next week with endocrine. You can always reach out as well at [phone]. We can then decide about surgery, dates at that time.  Pre-operative Testing -Once a date has been selected, you will receive a phone call from presurgical testing at [facility] to arrange for a pre-operative appointment and lab work.  -Bring your insurance card, copy of an advanced directive if applicable, medication list  -At that visit, you will be asked to sign a consent for a possible blood transfusion in case a transfusion becomes necessary during surgery.  The need for a blood transfusion is rare but having consent is a necessary part of your care.     -YOU WILL NEED TO HOLD YOUR PLAVIX  FOR 5 DAYS BEFORE SURGERY IF OK WITH CARDIOLOGY. YOU CAN CONTINUE TAKING YOUR BABY ASPIRIN  WITH THE LAST DOSE BEING THE DAY BEFORE SURGERY IN THE AM. DO NOT TAKE THE MORNING OF SURGERY.  -Do not take supplements such as fish oil (omega 3), red yeast rice, turmeric before your surgery. STOP TAKING AT LEAST 10 DAYS BEFORE SURGERY. You want to avoid medications with aspirin  in them including headache powders such as BC or Goody's), Excedrin migraine.  -If you are taking a GLP-1 medication/injection such as Ozempic , Mounjaro , Tzhncb, this needs to be held before surgery for at least 7 days before.  Day Before Surgery at Home -You will be asked to take in a light diet the day before surgery. You will be advised you can have clear liquids up until 3 hours before your surgery.    Eat a light diet the day before surgery.  Examples including soups, broths, toast, yogurt,  mashed potatoes.  AVOID GAS PRODUCING FOODS AND BEVERAGES. Things to avoid include carbonated beverages (fizzy beverages, sodas), raw fruits and raw vegetables (uncooked), or beans.   If your bowels are filled with gas, your surgeon will have difficulty visualizing your pelvic organs which increases your surgical risks.  Your role in recovery Your role is to become active as soon as directed by your doctor, while still giving yourself time to heal.  Rest when you feel tired. You will be asked to do the following in order to speed your recovery:  - Cough and breathe deeply. This helps to clear and expand your lungs and can prevent pneumonia after surgery.  - STAY ACTIVE WHEN YOU GET HOME. Do mild physical activity. Walking or moving your legs help your circulation and body functions return to normal. Do not try to get up or walk alone the first time after surgery.   -If you develop swelling on one leg or the other, pain in the back of your leg, redness/warmth in one of your legs, please call the office or go to the Emergency Room to have a doppler to rule out a blood clot. For shortness of breath, chest pain-seek care in the Emergency Room as soon as possible. - Actively manage your pain. Managing your pain lets you move in comfort. We will ask you to rate your pain on a scale of zero to 10. It is your responsibility to tell your doctor or nurse where and how much you hurt so  your pain can be treated.  Special Considerations -If you are diabetic, you may be placed on insulin  after surgery to have closer control over your blood sugars to promote healing and recovery.  This does not mean that you will be discharged on insulin .  If applicable, your oral antidiabetics will be resumed when you are tolerating a solid diet.  -Your final pathology results from surgery should be available around one week after surgery and the results will be relayed to you when available.  -Dr. Olam Mill is the  surgeon that assists your GYN Oncologist with surgery.  If you end up staying the night, the next day after your surgery you will either see Dr. Viktoria, Dr. Eldonna, or Dr. Olam Mill.  -FMLA forms can be faxed to [phone] and please allow 5-7 business days for completion.  Pain Management After Surgery -You will be prescribed your pain medication and bowel regimen medications before surgery so that you can have these available when you are discharged from the hospital. The pain medication is for use ONLY AFTER surgery and a new prescription will not be given.   -Make sure that you have Tylenol  and Ibuprofen IF YOU ARE ABLE TO TAKE THESE MEDICATIONS at home to use on a regular basis after surgery for pain control. We recommend alternating the medications every hour to six hours since they work differently and are processed in the body differently for pain relief.  -Review the attached handout on narcotic use and their risks and side effects.   Bowel Regimen -You will be prescribed Sennakot-S to take nightly to prevent constipation especially if you are taking the narcotic pain medication intermittently.  It is important to prevent constipation and drink adequate amounts of liquids. You can stop taking this medication when you are not taking pain medication and you are back on your normal bowel routine.  Risks of Surgery Risks of surgery are low but include bleeding, infection, damage to surrounding structures, re-operation, blood clots, and very rarely death.   Blood Transfusion Information (For the consent to be signed before surgery)  We will be checking your blood type before surgery so in case of emergencies, we will know what type of blood you would need.                                            WHAT IS A BLOOD TRANSFUSION?  A transfusion is the replacement of blood or some of its parts. Blood is made up of multiple cells which provide different functions. Red blood cells  carry oxygen and are used for blood loss replacement. White blood cells fight against infection. Platelets control bleeding. Plasma helps clot blood. Other blood products are available for specialized needs, such as hemophilia or other clotting disorders. BEFORE THE TRANSFUSION  Who gives blood for transfusions?  You may be able to donate blood to be used at a later date on yourself (autologous donation). Relatives can be asked to donate blood. This is generally not any safer than if you have received blood from a stranger. The same precautions are taken to ensure safety when a relative's blood is donated. Healthy volunteers who are fully evaluated to make sure their blood is safe. This is blood bank blood. Transfusion therapy is the safest it has ever been in the practice of medicine. Before blood is taken from a donor, a  complete history is taken to make sure that person has no history of diseases nor engages in risky social behavior (examples are intravenous drug use or sexual activity with multiple partners). The donor's travel history is screened to minimize risk of transmitting infections, such as malaria. The donated blood is tested for signs of infectious diseases, such as HIV and hepatitis. The blood is then tested to be sure it is compatible with you in order to minimize the chance of a transfusion reaction. If you or a relative donates blood, this is often done in anticipation of surgery and is not appropriate for emergency situations. It takes many days to process the donated blood. RISKS AND COMPLICATIONS Although transfusion therapy is very safe and saves many lives, the main dangers of transfusion include:  Getting an infectious disease. Developing a transfusion reaction. This is an allergic reaction to something in the blood you were given. Every precaution is taken to prevent this. The decision to have a blood transfusion has been considered carefully by your caregiver before blood is  given. Blood is not given unless the benefits outweigh the risks.  AFTER SURGERY INSTRUCTIONS  Return to work: 1-2 weeks if applicable  Activity: 1. Be up and out of the bed during the day.  Take a nap if needed.  You may walk up steps but be careful and use the hand rail.  Stair climbing will tire you more than you think, you may need to stop part way and rest.   2. No lifting or straining for 4-6 weeks over 10 pounds. No pushing, pulling, straining for 6 weeks.  3. No driving for 4-89 days when the following criteria have been met: Do not drive if you are taking narcotic pain medicine and make sure that your reaction time has returned.   4. You can shower as soon as the next day after surgery. Shower daily.  Use your regular soap and water (not directly on the incision) and pat your incision(s) dry afterwards; don't rub.  No tub baths or submerging your body in water until cleared by your surgeon. If you have the soap that was given to you by pre-surgical testing that was used before surgery, you do not need to use it afterwards because this can irritate your incisions.   5. No sexual activity and nothing in the vagina for 4-6 weeks.  6. You may experience a small amount of clear drainage from your incisions, which is normal.  If the drainage persists, increases, or changes color please call the office.  7. Do not use creams, lotions, or ointments such as neosporin on your incisions after surgery until advised by your surgeon because they can cause removal of the dermabond glue on your incisions.    8. You may experience vaginal spotting after surgery or when the stitches at the top of the vagina begin to dissolve.  The spotting is normal but if you experience heavy bleeding, call our office.  9. Take Tylenol  or ibuprofen first for pain if you are able to take these medications and only use narcotic pain medication for severe pain not relieved by the Tylenol  or Ibuprofen.  Monitor your  Tylenol  intake to a max of 4,000 mg in a 24 hour period. You can alternate these medications after surgery.  Diet: 1. Low sodium Heart Healthy Diet is recommended but you are cleared to resume your normal (before surgery) diet after your procedure.  2. It is safe to use a laxative, such as  Miralax or Colace, if you have difficulty moving your bowels before surgery. You have been prescribed Sennakot-S to take at bedtime every evening after surgery to keep bowel movements regular and to prevent constipation.    Wound Care: 1. Keep clean and dry.  Shower daily.  Reasons to call the Doctor: Fever - Oral temperature greater than 100.4 degrees Fahrenheit Foul-smelling vaginal discharge Difficulty urinating Nausea and vomiting Increased pain at the site of the incision that is unrelieved with pain medicine. Difficulty breathing with or without chest pain New calf pain especially if only on one side Sudden, continuing increased vaginal bleeding with or without clots.   Contacts: For questions or concerns you should contact:  Dr. Hoy Masters at [phone]  [name], NP at [phone]  After Hours: call [phone] and have the GYN Oncologist paged/contacted (after 5 pm or on the weekends). You will speak with an after hours RN and let he or she know you have had surgery.  Messages sent via mychart are for non-urgent matters and are not responded to after hours so for urgent needs, please call the after hours number.

## 2023-08-08 NOTE — Progress Notes (Signed)
 Patient here for a follow up with Dr. Eldonna and for a pre-operative appointment.  She will be scheduled for a robotic assisted laparoscopic bilateral salpingo-oophorectomy. The surgery was discussed in detail.  See after visit summary for additional details.    Discussed post-op pain management in detail including the aspects of the enhanced recovery pathway.  Advised her that a new prescription would be sent once her surgery is scheduled and is only to be used for after her upcoming surgery.  We discussed the use of tylenol  post-op and to monitor for a maximum of 4,000 mg in a 24 hour period.  Also let her know that sennakot will be prescribed to be used after surgery and to hold if having loose stools.  Discussed bowel regimen in detail.     Discussed the use of SCDs and measures to take at home to prevent DVT including frequent mobility.  Reportable signs and symptoms of DVT discussed. Post-operative instructions discussed and expectations for after surgery. Incisional care discussed as well including reportable signs and symptoms including erythema, drainage, wound separation.     30 minutes spent with the patient.  Verbalizing understanding of material discussed. No needs or concerns voiced at the end of the visit.   Advised patient to call for any needs.  Advised that her post-operative medications had been prescribed and could be picked up at any time.    This appointment is included in the global surgical bundle as pre-operative teaching and has no charge.

## 2023-08-08 NOTE — Progress Notes (Signed)
 Gynecologic Oncology Return Clinic Visit  Date of Service: 08/08/2023 Referring Provider: Hadassah Springer, MD   Assessment & Plan: Catherine Moon is a 63 y.o. woman with postmenopausal bleeding with benign sampling (proliferative endometrium) and removal of submucosal fibroid and ultrasound with bilateral ovarian cysts, medically complicated by CAD with recent CABG, insulin -dependent diabetes, HTN, HLD. MRI suggestive ovarian hyperthecosis, elevated serum testosterone  (176).  Reviewed workup to date.  Reviewed MRI findings.  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  No concerning adnexal mass otherwise. Serum testosterone  was checked on 05/16/23 and was found to be elevated.   Reviewed that given the combined MRI findings and the elevated serum testosterone  in a postmenopausal woman, would recommend treatment with BSO.  Patient does have medical comorbidities but given her recent CABG she may be as optimized as possible at this time.  Patient would like to also follow-up with her endocrinologist to weigh in to see if medical management is a reasonable alternative.  Reviewed with patient that I did reach out to her endocrinologist to agreed with surgical management but will see how her appointment next week goes.  Patient also has scheduled follow-up with cardiology in September.  Would obtain clearance to ensure that patient is optimized for surgery.  In the meantime, reviewed surgical plan if patient decides to proceed.  Patient was consented for: Robotic assisted bilateral salpingo-oophorectomy on TBD.  The risks of surgery were discussed in detail and she understands these to including but not limited to bleeding requiring a blood transfusion, infection, injury to adjacent organs (including but not limited to the bowels, bladder, ureters, nerves, blood vessels), thromboembolic events, wound separation, hernia, unforseen complication, possible need for re-exploration, and medical  complications such as heart attack, stroke, pneumonia.  If the patient experiences any of these events, she understands that her hospitalization or recovery may be prolonged and that she may need to take additional medications for a prolonged period. The patient will receive DVT and antibiotic prophylaxis as indicated. She voiced a clear understanding. She had the opportunity to ask questions and informed consent was obtained today. She wishes to proceed.  METS >4 but given her cardiac history will obtain clearance. Okay with continuing LDASA. Would prefer to have plavix  held 5 days but will confirm with cardiologist.  Plan same-day discharge. All preoperative instructions were reviewed. Postoperative expectations were also reviewed. Written handouts were provided to the patient.    RTC postop  Hoy Masters, MD Gynecologic Oncology   Medical Decision Making I personally spent  TOTAL 35 minutes face-to-face and non-face-to-face in the care of this patient, which includes all pre, intra, and post visit time on the date of service.   ----------------------- Reason for Visit: Follow-up   Interval History: Patient reports that she is overall doing well no major changes since her last visit.  She follows up with endocrinology next week on 08/16/2023.  Had transportation issues at time of previously scheduled appointment.  Does note she has had a little bit of diarrhea lately but thinks this may be from prune juice.  She was having constipation after heart surgery so has been doing things with her diet to try to help with this.  Otherwise she denies any new vaginal bleeding, abdominal/pelvic pain, unintentional weight loss, change in bladder habits, early satiety, bloating, nausea/vomiting.   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  She works in Designer, fashion/clothing.  She  continues on aspirin  81 mg and  Plavix .  She sees Dr. Verlin with cardiology next on 09/07/23.  Not currently experiencing any chest pain or shortness of breath.    Past Medical/Surgical History: Past Medical History:  Diagnosis Date   (HFpEF) heart failure with preserved ejection fraction (HCC)    Anticoagulated    plavix  and asa 81mg   ---- managed by cardiology   Aortic stenosis due to bicuspid aortic valve    per intraoperative TEE 11-30-2023  aortic bicuspid valve w/ mild stenosis,  area 1.46   Clotting disorder (HCC) 09/26/22   Postmenopausal bl   Coronary artery disease 11/2022   cardiologist--- dr verlin;   NSTEMI 11-26-2022  cath w/ severe 2 vessel CAD ;   11-30-2022  s/p CABG x2   CTS (carpal tunnel syndrome)    Diverticulosis of colon    GERD (gastroesophageal reflux disease)    Heart murmur    had ECHO 01-2021   Hemorrhoids    interenal and external   History of adenomatous polyp of colon    History of non-ST elevation myocardial infarction (NSTEMI)    HTN (hypertension)    Hyperlipidemia, mixed    IDA (iron deficiency anemia)    Insulin  dependent type 2 diabetes mellitus [facility]) 1997   endocrinologist--- dr trixie;   dx 1997 DM2;  started insulin  in 2010   Mild nonproliferative diabetic retinopathy without macular edema associated with type 2 diabetes mellitus (HCC)    followed gy dr abigail;   both eyes   PMB (postmenopausal bleeding)    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   LIMA--LAD;   RSVG--PLV  by dr lightfoot   Seasonal allergies    Thickened endometrium    Warts, genital    Wears glasses     Past Surgical History:  Procedure Laterality Date   COLONOSCOPY WITH PROPOFOL   02/17/2015   dr glo   CORONARY ARTERY BYPASS GRAFT N/A 11/30/2022   Procedure: CORONARY ARTERY BYPASS GRAFTING X 2 , USING LEFT INTERNAL MAMMARY ARTERY AND ENDOSCOPICALLY HARVESTED RIGHT SAPHENOUS VEIN GRAFT;  Surgeon: Shyrl Linnie KIDD, MD;  Location: [facility] OR;  Service: Open Heart Surgery;  Laterality: N/A;   CYST EXCISION  Right 03/26/2021   Procedure: RIGHT AXILLARY SEBACEOUS CYST EXCISION;  Surgeon: Ebbie Cough, MD;  Location: [facility];  Service: General;  Laterality: Right;  LOCAL   DILATATION & CURETTAGE/HYSTEROSCOPY WITH MYOSURE N/A 03/17/2023   Procedure: DILATATION & CURETTAGE/HYSTEROSCOPY WITH MYOSURE;  Surgeon: D'Iorio, Hadassah LABOR, MD;  Location: [facility] OR;  Service: Gynecology;  Laterality: N/A;  [facility]   DILATION AND CURETTAGE OF UTERUS  1977   LEFT HEART CATH AND CORONARY ANGIOGRAPHY N/A 11/26/2022   Procedure: LEFT HEART CATH AND CORONARY ANGIOGRAPHY;  Surgeon: Darron Deatrice LABOR, MD;  Location: [facility];  Service: Cardiovascular;  Laterality: N/A;   TEE WITHOUT CARDIOVERSION N/A 11/30/2022   Procedure: TRANSESOPHAGEAL ECHOCARDIOGRAM (TEE);  Surgeon: Shyrl Linnie KIDD, MD;  Location: [facility] OR;  Service: Open Heart Surgery;  Laterality: N/A;   WISDOM TOOTH EXTRACTION      Family History  Problem Relation Age of Onset   Breast cancer Mother    Heart disease Mother    Cancer Mother    Diabetes Mother    Coronary artery disease Father        starting in his 27s. A bypass and mitral infarction   Heart disease Father        Coronary Disease, bypass and mitral infarction  Lung cancer Maternal Grandfather        Forensic scientist   Colon cancer Paternal Grandmother    Diabetes Brother    Stroke Brother    Hypertension Brother    Hypertension Sister    Prostate cancer Neg Hx    Pancreatic cancer Neg Hx    Ovarian cancer Neg Hx    Endometrial cancer Neg Hx     Social History   Socioeconomic History   Marital status: Married    Spouse name: Not on file   Number of children: Not on file   Years of education: Not on file   Highest education level: Associate degree: academic program  Occupational History   Not on file  Tobacco Use   Smoking status: Former    Current packs/day: 0.00    Average packs/day: 1 pack/day for 26.0 years (26.0 ttl pk-yrs)    Types: Cigarettes     Start date: 08/12/1981    Quit date: 08/13/2007    Years since quitting: 15.9   Smokeless tobacco: Never   Tobacco comments:     She smoked one pack per day for 26 years but quit in 2009.  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  She smoked one pack per day for 26 years but quit in 2009.         Social Drivers of Corporate investment banker Strain: Low Risk  (06/16/2023)   Overall Financial Resource Strain (CARDIA)    Difficulty of Paying Living Expenses: Not hard at all  Food Insecurity: No Food Insecurity (06/16/2023)   Hunger Vital Sign    Worried About Running Out of Food in the Last Year: Never true    Ran Out of Food in the Last Year: Never true  Transportation Needs: No Transportation Needs (06/16/2023)   PRAPARE - Administrator, Civil Service (Medical): No    Lack of Transportation (Non-Medical): No  Physical Activity: Insufficiently Active (06/16/2023)   Exercise Vital Sign    Days of Exercise per Week: 2 days    Minutes of Exercise per Session: 10 min  Stress: Stress Concern Present (06/16/2023)   [employer] of Occupational Health - Occupational Stress Questionnaire    Feeling of Stress: To some extent  Social Connections: Moderately Isolated (06/16/2023)   Social Connection and Isolation Panel    Frequency of Communication with Friends and Family: Three times a week    Frequency of Social Gatherings with Friends and Family: Never    Attends Religious Services: Never    Database administrator or Organizations: No    Attends Engineer, structural: Not on file    Marital Status: Married    Current Medications:  Current Outpatient Medications:    acetaminophen   (TYLENOL ) 500 MG tablet, Take 1,000 mg by mouth every 6 (six) hours as needed for mild pain, moderate pain, fever or headache., Disp: , Rfl:    amLODipine  (NORVASC ) 5 MG tablet, Take 1 tablet (5 mg total) by mouth daily., Disp: 90 tablet, Rfl: 3   aspirin  81 MG EC tablet, Take 81 mg by mouth daily after lunch., Disp: , Rfl:  atorvastatin  (LIPITOR ) 80 MG tablet, TAKE 1 TABLET (80 MG TOTAL) BY MOUTH DAILY. DUE FOR PHYSICAL., Disp: 90 tablet, Rfl: 1   Black Elderberry 1000 MG CAPS, Take 1,000 mg by mouth daily after lunch., Disp: , Rfl:    calcium  carbonate (OSCAL) 1500 (600 Ca) MG TABS tablet, Take 1,200 mg of elemental calcium  by mouth daily after lunch., Disp: , Rfl:    cholecalciferol (VITAMIN D3) 25 MCG (1000 UNIT) tablet, Take 1,000 Units by mouth daily after lunch., Disp: , Rfl:    clopidogrel  (PLAVIX ) 75 MG tablet, Take 1 tablet (75 mg total) by mouth daily., Disp: 90 tablet, Rfl: 3   Continuous Glucose Sensor (FREESTYLE LIBRE 3 PLUS SENSOR) MISC, USe as directed every 14 (fourteen) days., Disp: 6 each, Rfl: 3   Cranberry 4200 MG CAPS, Take 8,400 mg by mouth daily after lunch., Disp: , Rfl:    ezetimibe  (ZETIA ) 10 MG tablet, Take 1 tablet (10 mg total) by mouth daily., Disp: 90 tablet, Rfl: 3   famotidine (PEPCID) 10 MG tablet, Take 10 mg by mouth daily after lunch., Disp: , Rfl:    ferrous sulfate  325 (65 FE) MG tablet, Take 325 mg by mouth daily after lunch., Disp: , Rfl:    fexofenadine (ALLEGRA) 180 MG tablet, Take 180 mg by mouth daily after lunch., Disp: , Rfl:    Ginger, Zingiber officinalis, (GINGER ROOT) 550 MG CAPS, Take 550 mg by mouth daily after lunch., Disp: , Rfl:    Green Tea 315 MG CAPS, Take 3,015 mg by mouth daily after lunch., Disp: , Rfl:    ibuprofen (ADVIL) 200 MG tablet, Take 400 mg by mouth every 6 (six) hours as needed for moderate pain (pain score 4-6) or mild pain (pain score 1-3)., Disp: , Rfl:    Insulin  Pen Needle 32G X 4 MM MISC, Use 4x a day, Disp: 400 each,  Rfl: 3   insulin  regular human CONCENTRATED (HUMULIN  R U-500 KWIKPEN) 500 UNIT/ML KwikPen, Inject under skin up to 200 units insulin  a day as advised, Disp: 36 mL, Rfl: 2   loratadine  (CLARITIN ) 10 MG tablet, Take 10 mg by mouth daily after lunch., Disp: , Rfl:    magnesium  oxide (MAG-OX) 400 MG tablet, Take 400 mg by mouth daily after lunch., Disp: , Rfl:    metoprolol  tartrate (LOPRESSOR ) 50 MG tablet, Take 1 tablet (50 mg total) by mouth 2 (two) times daily., Disp: 180 tablet, Rfl: 2   Multiple Vitamin (MULTIVITAMIN WITH MINERALS) TABS tablet, Take 1 tablet by mouth daily after lunch., Disp: , Rfl:    Multiple Vitamins-Minerals (HAIR SKIN & NAILS) TABS, Take 1 tablet by mouth daily after lunch., Disp: , Rfl:    Multiple Vitamins-Minerals (PRESERVISION AREDS PO), Take 1 capsule by mouth daily after lunch., Disp: , Rfl:    Potassium 99 MG TABS, Take 1 tablet (99 mg total) by mouth daily., Disp: , Rfl:    Probiotic Product (PROBIOTIC DAILY PO), Take by mouth., Disp: , Rfl:    sodium chloride  (OCEAN) 0.65 % SOLN nasal spray, Place 1 spray into both nostrils daily as needed for congestion., Disp: , Rfl:    tirzepatide  (MOUNJARO ) 10 MG/0.5ML Pen, Inject 10 mg into the skin once a week., Disp: 6 mL, Rfl: 3   zinc gluconate 50 MG tablet, Take 50 mg by mouth daily after lunch., Disp: , Rfl:   Review of Symptoms: Complete 10-system review is positive for: Joint pain, headache, diarrhea, anxiety  Physical Exam: BP 130/62 Comment:  manual recheck  Pulse 76   Temp 98.4 F (36.9 C) (Oral)   Resp 20   Wt 230 lb (104.3 kg)   SpO2 94%   BMI 37.12 kg/m  General: Alert, oriented, no acute distress. HEENT: Normocephalic, atraumatic. Receeding hairline. Facial hirsutism, shaved Chest: Normal work of breathing. CTAB CV: RRR, murmur present Abdomen: central adiposity, nontender to palpation, normal bowel sounds Extremities: Grossly normal range of motion.  Warm, well perfused.  No edema  bilaterally.   Laboratory & Radiologic Studies:  Lab Results  Component Value Date   TESTOSTERONE  176 (H) 05/16/2023   TESTOFREE 3.9 05/16/2023   SHBG 39.7 05/16/2023

## 2023-08-16 ENCOUNTER — Ambulatory Visit: Admitting: Internal Medicine

## 2023-08-16 ENCOUNTER — Encounter: Payer: Self-pay | Admitting: Internal Medicine

## 2023-08-16 VITALS — BP 120/60 | HR 85 | Ht 66.0 in | Wt 230.2 lb

## 2023-08-16 DIAGNOSIS — Z794 Long term (current) use of insulin: Secondary | ICD-10-CM

## 2023-08-16 DIAGNOSIS — E785 Hyperlipidemia, unspecified: Secondary | ICD-10-CM

## 2023-08-16 DIAGNOSIS — E113291 Type 2 diabetes mellitus with mild nonproliferative diabetic retinopathy without macular edema, right eye: Secondary | ICD-10-CM | POA: Diagnosis not present

## 2023-08-16 DIAGNOSIS — E1169 Type 2 diabetes mellitus with other specified complication: Secondary | ICD-10-CM

## 2023-08-16 LAB — POCT GLYCOSYLATED HEMOGLOBIN (HGB A1C): Hemoglobin A1C: 7.8 % — AB (ref 4.0–5.6)

## 2023-08-16 MED ORDER — TIRZEPATIDE 12.5 MG/0.5ML ~~LOC~~ SOAJ: 12.5000 mg | SUBCUTANEOUS | 3 refills | Status: AC

## 2023-08-16 MED ORDER — DEXCOM G7 SENSOR MISC
3.0000 | 4 refills | Status: DC
Start: 2023-08-16 — End: 2023-11-07

## 2023-08-16 NOTE — Addendum Note (Signed)
 Addended by: CLEOTILDE ROLIN RAMAN on: 08/16/2023 03:53 PM   Modules accepted: Orders

## 2023-08-16 NOTE — Patient Instructions (Addendum)
 Please try to increase: - Mounjaro  12.5 mg weekly  Continue: - U500: - 80 units in am  - 60 units before lunch - 60 units before dinner  Try to get the [place] CGM.  Try to work on [employer].  Please return in 3-4 months with your sugar log.

## 2023-08-16 NOTE — Progress Notes (Addendum)
 Patient ID: Catherine Moon, female   DOB: 03/07/1960, 63 y.o.   MRN: [id number]  HPI: Catherine Moon is a 63 y.o.-year-old female, returning for follow-up for DM2, dx in 1997, insulin -dependent since 2010, uncontrolled, with complications (CAD - s/p NSTEMI, s/p CABG 11/2022, CHF, diabetic retinopathy).  She previously saw Dr. Kassie.  Last visit with me 4 months ago.  Interim history: No increased urination, blurry vision, nausea. She is on Pepcid for gas and reflux - feels better. Before last visit, she started to change her diet and lost 28 pounds!  She gained 6 pounds since last visit. Since last visit, she was found to have ovarian cysts and was diagnosed with ovarian hyperthecosis.  BSO is pending.  Wanted to asked my opinion about this.  Reviewed HbA1c: Lab Results  Component Value Date   HGBA1C 5.9 (A) 01/28/2023   HGBA1C 7.5 (H) 11/26/2022   HGBA1C 8.8 09/14/2022   HGBA1C 8.9 (A) 05/04/2022   HGBA1C 9.6 (A) 12/18/2021   HGBA1C 10.0 (A) 09/14/2021   HGBA1C 8.6 (A) 05/18/2021   HGBA1C 9.8 (A) 02/11/2021   HGBA1C 14.6 (A) 12/11/2020   HGBA1C 14.4 (H) 12/10/2020  09/14/2022: HbA1c 8.8%  Previously on: - Toujeo  60 units at bedtime (2-3  am) >> 80 >> 90 units daily - Humalog  30-40 units 3x a day before meals - Ozempic  2 mg weekly >> reflux worse  Now on: - Mounjaro  7.5 >> 10 mg weekly - U500: 100 units in am, 50 units before lunch and dinner >>  - 100 >> 80 units in am/mid-day - >> off >> 60 units before lunch - 80-90 >> 50-60 >> 60 units before dinner She was on Metformin >> GI distress.  She had a Dexcom before.  Pt checks her sugars 2x a day and they are: - am: 165-228, 336 >> 72-210, 244 >> 95-277, 299, 341 - 2h after b'fast: n/c - before lunch: n/c >> 140-160 >> n/c >> 178-250, 325 - 2h after lunch: n/c - before dinner: 160-230 >> 85, 166-284 >> 61-214, 229 >> 83-182, 248, 405 - 2h after dinner: up to 300 >> up to 300 >> 318 >> n/c  >> 117,  231-283 - bedtime: n/c  >> 107-168, 242 >> n/c - nighttime: n/c >> 60-69, 85 >> n/c Lowest sugar was 121 >> 85 >> 60s >> 83; she has hypoglycemia awareness at 70.  Highest sugar was 400 (icecream, no Humalog ) ...>> 336 >> 242 >> 405.  Glucometer: One Touch ultra  - no CKD, last BUN/creatinine:  Lab Results  Component Value Date   BUN 17 06/21/2023   BUN 17 03/17/2023   CREATININE 0.84 06/21/2023   CREATININE 0.71 03/17/2023     Lab Results  Component Value Date   MICRALBCREAT 2.1 11/18/2009   MICRALBCREAT 6.9 11/05/2005  On lisinopril  20 mg daily.  -+ Hyperlipidemia including hypertriglyceridemia; last set of lipids: Lab Results  Component Value Date   CHOL 102 11/26/2022   HDL 36 (L) 11/26/2022   LDLCALC 42 11/26/2022   LDLDIRECT 137.0 12/10/2020   TRIG 122 11/26/2022   CHOLHDL 2.8 11/26/2022  On Lipitor  80 mg daily.  - last eye exam was on  08/09/2023. + Mild NPDR reportedly. She has borderline glaucoma. Dr. Abigail.   - no numbness and tingling in her feet.  Last foot exam 09/14/2022.  On ASA 81.  She is staying up late and wakes up late.  She works from home. She developed chest pain in 11/2022.  She ended up having CABG 11/30/2022.  At our last visit she was in cardiac rehab and she was off from work.  ROS: + see HPI  Past Medical History:  Diagnosis Date   (HFpEF) heart failure with preserved ejection fraction (HCC)    Anticoagulated    plavix  and asa 81mg   ---- managed by cardiology   Aortic stenosis due to bicuspid aortic valve    per intraoperative TEE 11-30-2023  aortic bicuspid valve w/ mild stenosis,  area 1.46   Clotting disorder (HCC) 09/26/22   Postmenopausal bl   Coronary artery disease 11/2022   cardiologist--- dr verlin;   NSTEMI 11-26-2022  cath w/ severe 2 vessel CAD ;   11-30-2022  s/p CABG x2   CTS (carpal tunnel syndrome)    Diverticulosis of colon    GERD (gastroesophageal reflux disease)    Heart murmur    had ECHO 01-2021    Hemorrhoids    interenal and external   History of adenomatous polyp of colon    History of non-ST elevation myocardial infarction (NSTEMI)    HTN (hypertension)    Hyperlipidemia, mixed    IDA (iron deficiency anemia)    Insulin  dependent type 2 diabetes mellitus [facility]) 1997   endocrinologist--- dr trixie;   dx 1997 DM2;  started insulin  in 2010   Mild nonproliferative diabetic retinopathy without macular edema associated with type 2 diabetes mellitus (HCC)    followed gy dr abigail;   both eyes   PMB (postmenopausal bleeding)    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   LIMA--LAD;   RSVG--PLV  by dr lightfoot   Seasonal allergies    Thickened endometrium    Warts, genital    Wears glasses    Past Surgical History:  Procedure Laterality Date   COLONOSCOPY WITH PROPOFOL   02/17/2015   dr glo   CORONARY ARTERY BYPASS GRAFT N/A 11/30/2022   Procedure: CORONARY ARTERY BYPASS GRAFTING X 2 , USING LEFT INTERNAL MAMMARY ARTERY AND ENDOSCOPICALLY HARVESTED RIGHT SAPHENOUS VEIN GRAFT;  Surgeon: Shyrl Linnie KIDD, MD;  Location: [facility] OR;  Service: Open Heart Surgery;  Laterality: N/A;   CYST EXCISION Right 03/26/2021   Procedure: RIGHT AXILLARY SEBACEOUS CYST EXCISION;  Surgeon: Ebbie Cough, MD;  Location: [facility];  Service: General;  Laterality: Right;  LOCAL   DILATATION & CURETTAGE/HYSTEROSCOPY WITH MYOSURE N/A 03/17/2023   Procedure: DILATATION & CURETTAGE/HYSTEROSCOPY WITH MYOSURE;  Surgeon: D'Iorio, Hadassah LABOR, MD;  Location: [facility] OR;  Service: Gynecology;  Laterality: N/A;  [facility]   DILATION AND CURETTAGE OF UTERUS  1977   LEFT HEART CATH AND CORONARY ANGIOGRAPHY N/A 11/26/2022   Procedure: LEFT HEART CATH AND CORONARY ANGIOGRAPHY;  Surgeon: Darron Deatrice LABOR, MD;  Location: [facility];  Service: Cardiovascular;  Laterality: N/A;   TEE WITHOUT CARDIOVERSION N/A 11/30/2022   Procedure: TRANSESOPHAGEAL ECHOCARDIOGRAM (TEE);  Surgeon: Shyrl Linnie KIDD, MD;  Location: [facility]  OR;  Service: Open Heart Surgery;  Laterality: N/A;   WISDOM TOOTH EXTRACTION     Social History   Socioeconomic History   Marital status: Married    Spouse name: Not on file   Number of children: Not on file   Years of education: Not on file   Highest education level: Associate degree: academic program  Occupational History   Not on file  Tobacco Use   Smoking status: Former    Current packs/day: 0.00    Average packs/day: 1 pack/day for 26.0 years (26.0 ttl pk-yrs)  Types: Cigarettes    Start date: 08/12/1981    Quit date: 08/13/2007    Years since quitting: 16.0   Smokeless tobacco: Never   Tobacco comments:     She smoked one pack per day for 26 years but quit in 2009.  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  She smoked one pack per day for 26 years but quit in 2009.         Social Drivers of Corporate investment banker Strain: Low Risk  (06/16/2023)   Overall Financial Resource Strain (CARDIA)    Difficulty of Paying Living Expenses: Not hard at all  Food Insecurity: No Food Insecurity (06/16/2023)   Hunger Vital Sign    Worried About Running Out of Food in the Last Year: Never true    Ran Out of Food in the Last Year: Never true  Transportation Needs: No Transportation Needs (06/16/2023)   PRAPARE - Administrator, Civil Service (Medical): No    Lack of Transportation (Non-Medical): No  Physical Activity: Insufficiently Active (06/16/2023)   Exercise Vital Sign    Days of Exercise per Week: 2 days    Minutes of Exercise per Session: 10 min  Stress: Stress Concern Present (06/16/2023)   [employer] of Occupational Health - Occupational Stress Questionnaire     Feeling of Stress: To some extent  Social Connections: Moderately Isolated (06/16/2023)   Social Connection and Isolation Panel    Frequency of Communication with Friends and Family: Three times a week    Frequency of Social Gatherings with Friends and Family: Never    Attends Religious Services: Never    Database administrator or Organizations: No    Attends Engineer, structural: Not on file    Marital Status: Married  Catering manager Violence: Not At Risk (01/11/2023)   Humiliation, Afraid, Rape, and Kick questionnaire    Fear of Current or Ex-Partner: No    Emotionally Abused: No    Physically Abused: No    Sexually Abused: No   Current Outpatient Medications on File Prior to Visit  Medication Sig Dispense Refill   acetaminophen  (TYLENOL ) 500 MG tablet Take 1,000 mg by mouth every 6 (six) hours as needed for mild pain, moderate pain, fever or headache.     amLODipine  (NORVASC ) 5 MG tablet Take 1 tablet (5 mg total) by mouth daily. 90 tablet 3   aspirin  81 MG EC tablet Take 81 mg by mouth daily after lunch.     atorvastatin  (LIPITOR ) 80 MG tablet TAKE 1 TABLET (80 MG TOTAL) BY MOUTH DAILY. DUE FOR PHYSICAL. 90 tablet 1   Black Elderberry 1000 MG CAPS Take 1,000 mg by mouth daily after lunch.     calcium  carbonate (OSCAL) 1500 (600 Ca) MG TABS tablet Take 1,200 mg of elemental calcium  by mouth daily after lunch.     cholecalciferol (VITAMIN D3) 25 MCG (1000 UNIT) tablet Take 1,000 Units by mouth daily after lunch.     clopidogrel  (PLAVIX ) 75 MG tablet Take 1 tablet (75 mg total) by mouth daily. 90 tablet 3   Continuous Glucose Sensor (FREESTYLE LIBRE 3 PLUS SENSOR) MISC  USe as directed every 14 (fourteen) days. 6 each 3   Cranberry 4200 MG CAPS Take 8,400 mg by mouth daily after lunch.     ezetimibe  (ZETIA ) 10 MG tablet Take 1 tablet (10 mg total) by mouth daily. 90 tablet 3   famotidine (PEPCID) 10 MG tablet Take 10 mg by mouth daily after lunch.     ferrous sulfate  325 (65 FE)  MG tablet Take 325 mg by mouth daily after lunch.     fexofenadine (ALLEGRA) 180 MG tablet Take 180 mg by mouth daily after lunch.     Ginger, Zingiber officinalis, (GINGER ROOT) 550 MG CAPS Take 550 mg by mouth daily after lunch.     Green Tea 315 MG CAPS Take 3,015 mg by mouth daily after lunch.     ibuprofen (ADVIL) 200 MG tablet Take 400 mg by mouth every 6 (six) hours as needed for moderate pain (pain score 4-6) or mild pain (pain score 1-3).     Insulin  Pen Needle 32G X 4 MM MISC Use 4x a day 400 each 3   insulin  regular human CONCENTRATED (HUMULIN  R U-500 KWIKPEN) 500 UNIT/ML KwikPen Inject under skin up to 200 units insulin  a day as advised 36 mL 2   loratadine  (CLARITIN ) 10 MG tablet Take 10 mg by mouth daily after lunch.     magnesium  oxide (MAG-OX) 400 MG tablet Take 400 mg by mouth daily after lunch.     metoprolol  tartrate (LOPRESSOR ) 50 MG tablet Take 1 tablet (50 mg total) by mouth 2 (two) times daily. 180 tablet 2   Multiple Vitamin (MULTIVITAMIN WITH MINERALS) TABS tablet Take 1 tablet by mouth daily after lunch.     Multiple Vitamins-Minerals (HAIR SKIN & NAILS) TABS Take 1 tablet by mouth daily after lunch.     Multiple Vitamins-Minerals (PRESERVISION AREDS PO) Take 1 capsule by mouth daily after lunch.     Potassium 99 MG TABS Take 1 tablet (99 mg total) by mouth daily.     Probiotic Product (PROBIOTIC DAILY PO) Take by mouth.     sodium chloride  (OCEAN) 0.65 % SOLN nasal spray Place 1 spray into both nostrils daily as needed for congestion.     tirzepatide  (MOUNJARO ) 10 MG/0.5ML Pen Inject 10 mg into the skin once a week. 6 mL 3   zinc gluconate 50 MG tablet Take 50 mg by mouth daily after lunch.     No current facility-administered medications on file prior to visit.   No Known Allergies Family History  Problem Relation Age of Onset   Breast cancer Mother    Heart disease Mother    Cancer Mother    Diabetes Mother    Coronary artery disease Father        starting in  his 56s. A bypass and mitral infarction   Heart disease Father        Coronary Disease, bypass and mitral infarction   Lung cancer Maternal Grandfather        Forensic scientist   Colon cancer Paternal Grandmother    Diabetes Brother    Stroke Brother    Hypertension Brother    Hypertension Sister    Prostate cancer Neg Hx    Pancreatic cancer Neg Hx    Ovarian cancer Neg Hx    Endometrial cancer Neg Hx    PE: BP 120/60   Pulse 85   Ht 5' 6 (1.676 m)   Wt 230 lb 3.2 oz (104.4 kg)   SpO2 96%  BMI 37.16 kg/m  Wt Readings from Last 10 Encounters:  08/16/23 230 lb 3.2 oz (104.4 kg)  08/08/23 230 lb (104.3 kg)  06/21/23 223 lb 6 oz (101.3 kg)  05/16/23 222 lb (100.7 kg)  04/25/23 218 lb 12.8 oz (99.2 kg)  03/17/23 215 lb (97.5 kg)  01/28/23 224 lb 9.6 oz (101.9 kg)  01/21/23 226 lb 6.4 oz (102.7 kg)  01/20/23 228 lb (103.4 kg)  12/21/22 229 lb 4 oz (104 kg)   Constitutional: overweight, in NAD Eyes: no exophthalmos ENT: no masses palpated in neck, no cervical lymphadenopathy Cardiovascular: RRR, No MRG, + mild pitting edema LE edema Respiratory: CTA B Musculoskeletal: no deformities Skin:no rashes Neurological: no tremor with outstretched hands Diabetic Foot Exam - Simple   Simple Foot Form Diabetic Foot exam was performed with the following findings: Yes 08/16/2023  2:45 PM  Visual Inspection No deformities, no ulcerations, no other skin breakdown bilaterally: Yes Sensation Testing Intact to touch and monofilament testing bilaterally: Yes Pulse Check Posterior Tibialis and Dorsalis pulse intact bilaterally: Yes Comments + mild pitting edema in bilateral feet    ASSESSMENT: 1. DM2, insulin -dependent, uncontrolled, with complications - CAD, s/p NSTEMI, s/p CABG 11/2022 - CHF - mild NPDR OD, without macular edema  2. HL  PLAN:  1. Patient with longstanding, uncontrolled, type 2 diabetes, very insulin  resistant, on U-500 insulin  and GLP-1/GIP receptor agonist with  impressively improved blood sugar control at last visit.  At that time, HbA1c was 5.9%, decreased from 7.5% and previously 8.8%.  She was not on her sensor and I sent a new prescription to her pharmacy.  However, sugars were much better and she was actually dropping her blood sugars at night.  She had to snack at night to keep her sugars up.  She had not decreased the doses of insulin  as advised at the previous visit so I again advised her to use lower doses.  We continued the same dose of Mounjaro . -Since last visit, she did not get the libre CGM... At today's visit we again discussed about the importance of being on the sensor, now that sugars are much worse than before, up to 400s.  She attributes this to dietary indiscretions.  She has also been on Mounjaro  for 2 weeks recently due to not able to refill it.  She was able to refill it now, but we discussed about possibly increasing the dose to avoid the need to increase her U-500 insulin  dose.  She also absolutely needs to start improving her diet. - I suggested to:  Patient Instructions  Please try to increase: - Mounjaro  12.5 mg weekly  Continue: - U500: - 80 units in am  - 60 units before lunch - 60 units before dinner  Try to get the Dexcom CGM.  Try to work on [employer].  Please return in 3-4 months with your sugar log.   - we checked her HbA1c: 7.8% (higher) - advised to check sugars at different times of the day - 4x a day, rotating check times - advised for yearly eye exams >> she is UTD - will check an ACR today - return to clinic in 3-4 months  2. HL - Latest lipid panel was reviewed from 11/2022: Fractions at goal with the exception of a slightly low HDL: Lab Results  Component Value Date   CHOL 102 11/26/2022   HDL 36 (L) 11/26/2022   LDLCALC 42 11/26/2022   LDLDIRECT 137.0 12/10/2020   TRIG 122 11/26/2022  CHOLHDL 2.8 11/26/2022  -She continues on Lipitor  80 mg daily without side effects  Component     Latest  Ref Rng 08/16/2023  Hemoglobin A1C     4.0 - 5.6 % 7.8 !   Creatinine, Urine     20 - 275 mg/dL 868   Microalb, Ur     mg/dL 6.6   MICROALB/CREAT RATIO     <30 mg/g creat 50 (H)   ACR slightly elevated.  I plan to repeat this at next visit.  If still elevated, we will try to start an SGLT2 inhibitor.  I will check with her if she is taking lisinopril  20 mg daily.  Lela Fendt, MD PhD [facility] Endocrinology

## 2023-08-17 ENCOUNTER — Ambulatory Visit: Payer: Self-pay | Admitting: Internal Medicine

## 2023-08-17 DIAGNOSIS — Z794 Long term (current) use of insulin: Secondary | ICD-10-CM

## 2023-08-17 LAB — MICROALBUMIN / CREATININE URINE RATIO
Creatinine, Urine: 131 mg/dL (ref 20–275)
Microalb Creat Ratio: 50 mg/g{creat} — ABNORMAL HIGH (ref <30)
Microalb, Ur: 6.6 mg/dL

## 2023-08-18 ENCOUNTER — Telehealth: Payer: Self-pay | Admitting: Surgery

## 2023-08-18 NOTE — Telephone Encounter (Signed)
 Called patient to see if she has made a decision regarding moving forward with surgery, so we can discuss dates with her. LVM for callback.

## 2023-08-19 ENCOUNTER — Other Ambulatory Visit: Payer: Self-pay | Admitting: Family Medicine

## 2023-08-19 ENCOUNTER — Telehealth: Payer: Self-pay

## 2023-08-19 MED ORDER — FREESTYLE LIBRE 2 PLUS SENSOR MISC: 1.0000 | 3 refills | Status: AC

## 2023-08-19 NOTE — Telephone Encounter (Signed)
 Spoke with patient in regards to upcoming surgery dates.  Informed patient of the dates September 30th, October 14th and October 21st.  Patient agreed to October 21st.  Informed patient that our office will get her scheduled.  She voiced understanding.

## 2023-08-23 ENCOUNTER — Telehealth: Payer: Self-pay | Admitting: *Deleted

## 2023-08-23 ENCOUNTER — Telehealth: Payer: Self-pay

## 2023-08-23 NOTE — Telephone Encounter (Signed)
   Pre-operative Risk Assessment    Patient Name: Catherine Moon  DOB: Mar 06, 1960 MRN: [id number]   Date of last office visit: 01/21/23 LAMARR SATTERFIELD, NP Date of next office visit: 09/07/23 LONNI CASH, MD   Request for Surgical Clearance    Procedure:  ROBOTIC ASSISTED LAPAROSCOPIC BILATERAL SALPINGO- OOPHORECTOMY   Date of Surgery:  Clearance 10/25/23                                Surgeon:  DR HOY MASTERS Surgeon's Group or Practice Name:  [facility] Phone number:  [phone] Fax number:  [phone]   Type of Clearance Requested:   - Medical  - Pharmacy:  Hold Aspirin  (NOT ASKED ABOUT ON REQUEST) PLAVIX  5 DAYS PRIOR   Type of Anesthesia:  General    Additional requests/questions:    Signed, Lucie DELENA Ku   08/23/2023, 4:21 PM

## 2023-08-23 NOTE — Telephone Encounter (Signed)
 Per Dr Eldonna fax surgical optimization from to the patient's cardiology office Kandice Satterfield, NP) (need plavix  recommendations hold for 5 five days per Dr Eldonna)

## 2023-08-24 NOTE — Telephone Encounter (Signed)
   Name: Catherine Moon  DOB: 1960/09/24  MRN: [id number]  Primary Cardiologist: None  Chart reviewed as part of pre-operative protocol coverage. The patient has an upcoming visit scheduled with Dr. Verlin on 09/07/2023 at which time clearance can be addressed in case there are any issues that would impact surgical recommendations.   ROBOTIC ASSISTED LAPAROSCOPIC BILATERAL SALPINGO- OOPHORECTOMY Is not scheduled until 10/25/2023 as below. I added preop FYI to appointment note so that provider is aware to address at time of outpatient visit.  Per office protocol the cardiology provider should forward their finalized clearance decision and recommendations regarding antiplatelet therapy to the requesting party below.    This message will also be routed to pharmacy pool and/or Dr Verlin for input on holding ASA as requested below so that this information is available to the clearing provider at time of patient's appointment.   I will route this message as FYI to requesting party and remove this message from the preop box as separate preop APP input not needed at this time.   Please call with any questions.  Lamarr Satterfield, NP  08/24/2023, 8:54 AM

## 2023-09-02 ENCOUNTER — Other Ambulatory Visit (HOSPITAL_COMMUNITY): Payer: Self-pay

## 2023-09-07 ENCOUNTER — Ambulatory Visit: Attending: Cardiovascular Disease | Admitting: Cardiovascular Disease

## 2023-09-07 ENCOUNTER — Encounter: Payer: Self-pay | Admitting: Cardiovascular Disease

## 2023-09-07 VITALS — BP 128/78 | HR 100 | Ht 60.0 in | Wt 236.6 lb

## 2023-09-07 DIAGNOSIS — Z01810 Encounter for preprocedural cardiovascular examination: Secondary | ICD-10-CM | POA: Diagnosis not present

## 2023-09-07 DIAGNOSIS — I10 Essential (primary) hypertension: Secondary | ICD-10-CM | POA: Diagnosis not present

## 2023-09-07 DIAGNOSIS — E7800 Pure hypercholesterolemia, unspecified: Secondary | ICD-10-CM | POA: Diagnosis not present

## 2023-09-07 DIAGNOSIS — I2510 Atherosclerotic heart disease of native coronary artery without angina pectoris: Secondary | ICD-10-CM

## 2023-09-07 NOTE — Progress Notes (Signed)
 Chief Complaint  Patient presents with   Follow-up    CAD   History of Present Illness: 63 yo female with history of CAD s/p 2V CABG, chronic diastolic CHF, aortic stenosis, GERD, HTN, HLD, anemia, DM and peripheral neuropathy who is here today for follow up. She had a NSTEMI in November 2024 and cardiac cath showed severe left main stenosis and severe disease in the small ,non-dominant RCA. She underwent 2V CABG in November 2024 (LIMA to LAD, SVG to OM). Echo November 2024 with LVEF=55-60%. Bicuspid aortic valve with no evidence of aortic stenosis.   She is here today for follow up. The patient denies any chest pain, dyspnea, palpitations, lower extremity edema, orthopnea, PND, dizziness, near syncope or syncope. She has an upcoming  Primary Care Physician: [place], Betty G, MD   Past Medical History:  Diagnosis Date   (HFpEF) heart failure with preserved ejection fraction (HCC)    Anticoagulated    plavix  and asa 81mg   ---- managed by cardiology   Aortic stenosis due to bicuspid aortic valve    per intraoperative TEE 11-30-2023  aortic bicuspid valve w/ mild stenosis,  area 1.46   Clotting disorder (HCC) 09/26/22   Postmenopausal bl   Coronary artery disease 11/2022   cardiologist--- dr verlin;   NSTEMI 11-26-2022  cath w/ severe 2 vessel CAD ;   11-30-2022  s/p CABG x2   CTS (carpal tunnel syndrome)    Diverticulosis of colon    GERD (gastroesophageal reflux disease)    Heart murmur    had ECHO 01-2021   Hemorrhoids    interenal and external   History of adenomatous polyp of colon    History of non-ST elevation myocardial infarction (NSTEMI)    HTN (hypertension)    Hyperlipidemia, mixed    IDA (iron deficiency anemia)    Insulin  dependent type 2 diabetes mellitus [facility]) 1997   endocrinologist--- dr trixie;   dx 1997 DM2;  started insulin  in 2010   Mild nonproliferative diabetic retinopathy without macular edema associated with type 2 diabetes mellitus (HCC)    followed  gy dr abigail;   both eyes   PMB (postmenopausal bleeding)    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   LIMA--LAD;   RSVG--PLV  by dr lightfoot   Seasonal allergies    Thickened endometrium    Warts, genital    Wears glasses     Past Surgical History:  Procedure Laterality Date   COLONOSCOPY WITH PROPOFOL   02/17/2015   dr glo   CORONARY ARTERY BYPASS GRAFT N/A 11/30/2022   Procedure: CORONARY ARTERY BYPASS GRAFTING X 2 , USING LEFT INTERNAL MAMMARY ARTERY AND ENDOSCOPICALLY HARVESTED RIGHT SAPHENOUS VEIN GRAFT;  Surgeon: Shyrl Linnie KIDD, MD;  Location: [facility] OR;  Service: Open Heart Surgery;  Laterality: N/A;   CYST EXCISION Right 03/26/2021   Procedure: RIGHT AXILLARY SEBACEOUS CYST EXCISION;  Surgeon: Ebbie Cough, MD;  Location: [facility];  Service: General;  Laterality: Right;  LOCAL   DILATATION & CURETTAGE/HYSTEROSCOPY WITH MYOSURE N/A 03/17/2023   Procedure: DILATATION & CURETTAGE/HYSTEROSCOPY WITH MYOSURE;  Surgeon: D'Iorio, Hadassah LABOR, MD;  Location: [facility] OR;  Service: Gynecology;  Laterality: N/A;  [facility]   DILATION AND CURETTAGE OF UTERUS  1977   LEFT HEART CATH AND CORONARY ANGIOGRAPHY N/A 11/26/2022   Procedure: LEFT HEART CATH AND CORONARY ANGIOGRAPHY;  Surgeon: Darron Deatrice LABOR, MD;  Location: [facility];  Service: Cardiovascular;  Laterality: N/A;   TEE WITHOUT CARDIOVERSION N/A  11/30/2022   Procedure: TRANSESOPHAGEAL ECHOCARDIOGRAM (TEE);  Surgeon: Shyrl Linnie KIDD, MD;  Location: [facility];  Service: Open Heart Surgery;  Laterality: N/A;   WISDOM TOOTH EXTRACTION      Current Outpatient Medications  Medication Sig Dispense Refill   acetaminophen  (TYLENOL ) 500 MG tablet Take 1,000 mg by mouth every 6 (six) hours as needed for mild pain, moderate pain, fever or headache.     amLODipine  (NORVASC ) 5 MG tablet Take 1 tablet (5 mg total) by mouth daily. 90 tablet 3   aspirin  81 MG EC tablet Take 81 mg by mouth daily after lunch.     atorvastatin  (LIPITOR ) 80  MG tablet TAKE 1 TABLET (80 MG TOTAL) BY MOUTH DAILY. DUE FOR PHYSICAL. 90 tablet 1   Black Elderberry 1000 MG CAPS Take 1,000 mg by mouth daily after lunch.     calcium  carbonate (OSCAL) 1500 (600 Ca) MG TABS tablet Take 1,200 mg of elemental calcium  by mouth daily after lunch.     cholecalciferol (VITAMIN D3) 25 MCG (1000 UNIT) tablet Take 1,000 Units by mouth daily after lunch.     clopidogrel  (PLAVIX ) 75 MG tablet Take 1 tablet (75 mg total) by mouth daily. 90 tablet 3   Continuous Glucose Sensor (DEXCOM G7 SENSOR) MISC 3 each by Does not apply route every 30 (thirty) days. Apply 1 sensor every 10 days 9 each 4   Continuous Glucose Sensor (FREESTYLE LIBRE 2 PLUS SENSOR) MISC Inject 1 each into the skin as directed. Change sensor every 15 days 6 each 3   Cranberry 4200 MG CAPS Take 8,400 mg by mouth daily after lunch.     ezetimibe  (ZETIA ) 10 MG tablet Take 1 tablet (10 mg total) by mouth daily. 90 tablet 3   famotidine (PEPCID) 10 MG tablet Take 10 mg by mouth daily after lunch.     ferrous sulfate  325 (65 FE) MG tablet Take 325 mg by mouth daily after lunch.     fexofenadine (ALLEGRA) 180 MG tablet Take 180 mg by mouth daily after lunch.     Ginger, Zingiber officinalis, (GINGER ROOT) 550 MG CAPS Take 550 mg by mouth daily after lunch.     Green Tea 315 MG CAPS Take 3,015 mg by mouth daily after lunch.     ibuprofen (ADVIL) 200 MG tablet Take 400 mg by mouth every 6 (six) hours as needed for moderate pain (pain score 4-6) or mild pain (pain score 1-3).     Insulin  Pen Needle 32G X 4 MM MISC Use 4x a day 400 each 3   insulin  regular human CONCENTRATED (HUMULIN  R U-500 KWIKPEN) 500 UNIT/ML KwikPen Inject under skin up to 200 units insulin  a day as advised 36 mL 2   loratadine  (CLARITIN ) 10 MG tablet Take 10 mg by mouth daily after lunch.     magnesium  oxide (MAG-OX) 400 MG tablet Take 400 mg by mouth daily after lunch.     metoprolol  tartrate (LOPRESSOR ) 50 MG tablet Take 1 tablet (50 mg total)  by mouth 2 (two) times daily. 180 tablet 2   Multiple Vitamin (MULTIVITAMIN WITH MINERALS) TABS tablet Take 1 tablet by mouth daily after lunch.     Multiple Vitamins-Minerals (HAIR SKIN & NAILS) TABS Take 1 tablet by mouth daily after lunch.     Multiple Vitamins-Minerals (PRESERVISION AREDS PO) Take 1 capsule by mouth daily after lunch.     Potassium 99 MG TABS Take 1 tablet (99 mg total) by mouth daily.  Probiotic Product (PROBIOTIC DAILY PO) Take by mouth.     sodium chloride  (OCEAN) 0.65 % SOLN nasal spray Place 1 spray into both nostrils daily as needed for congestion.     tirzepatide  (MOUNJARO ) 12.5 MG/0.5ML Pen Inject 12.5 mg into the skin once a week. 6 mL 3   zinc gluconate 50 MG tablet Take 50 mg by mouth daily after lunch.     No current facility-administered medications for this visit.    No Known Allergies  Social History   Socioeconomic History   Marital status: Married    Spouse name: Not on file   Number of children: Not on file   Years of education: Not on file   Highest education level: Associate degree: academic program  Occupational History   Not on file  Tobacco Use   Smoking status: Former    Current packs/day: 0.00    Average packs/day: 1 pack/day for 26.0 years (26.0 ttl pk-yrs)    Types: Cigarettes    Start date: 08/12/1981    Quit date: 08/13/2007    Years since quitting: 16.0   Smokeless tobacco: Never   Tobacco comments:     She smoked one pack per day for 26 years but quit in 2009.  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  She smoked one pack per day for 26 years but quit in 2009.         Social Drivers of Corporate investment banker Strain: Low Risk   (06/16/2023)   Overall Financial Resource Strain (CARDIA)    Difficulty of Paying Living Expenses: Not hard at all  Food Insecurity: No Food Insecurity (06/16/2023)   Hunger Vital Sign    Worried About Running Out of Food in the Last Year: Never true    Ran Out of Food in the Last Year: Never true  Transportation Needs: No Transportation Needs (06/16/2023)   PRAPARE - Administrator, Civil Service (Medical): No    Lack of Transportation (Non-Medical): No  Physical Activity: Insufficiently Active (06/16/2023)   Exercise Vital Sign    Days of Exercise per Week: 2 days    Minutes of Exercise per Session: 10 min  Stress: Stress Concern Present (06/16/2023)   [employer] of Occupational Health - Occupational Stress Questionnaire    Feeling of Stress: To some extent  Social Connections: Moderately Isolated (06/16/2023)   Social Connection and Isolation Panel    Frequency of Communication with Friends and Family: Three times a week    Frequency of Social Gatherings with Friends and Family: Never    Attends Religious Services: Never    Database administrator or Organizations: No    Attends Engineer, structural: Not on file    Marital Status: Married  Catering manager Violence: Not At Risk (01/11/2023)   Humiliation, Afraid, Rape, and Kick questionnaire    Fear of Current or Ex-Partner: No    Emotionally Abused: No    Physically Abused: No    Sexually Abused: No    Family History  Problem Relation Age of Onset   Breast cancer Mother    Heart disease Mother  Cancer Mother    Diabetes Mother    Coronary artery disease Father        starting in his 15s. A bypass and mitral infarction   Heart disease Father        Coronary Disease, bypass and mitral infarction   Lung cancer Maternal Grandfather        Forensic scientist   Colon cancer Paternal Grandmother    Diabetes Brother    Stroke Brother    Hypertension Brother    Hypertension Sister    Prostate cancer Neg Hx     Pancreatic cancer Neg Hx    Ovarian cancer Neg Hx    Endometrial cancer Neg Hx     Review of Systems:  As stated in the HPI and otherwise negative.   BP 128/78   Pulse 100   Ht 5' (1.524 m)   Wt 236 lb 9.6 oz (107.3 kg)   SpO2 97%   BMI 46.21 kg/m   Physical Examination: General: Well developed, well nourished, NAD  HEENT: OP clear, mucus membranes moist  SKIN: warm, dry. No rashes. Neuro: No focal deficits  Musculoskeletal: Muscle strength 5/5 all ext  Psychiatric: Mood and affect normal  Neck: No JVD, no carotid bruits, no thyromegaly, no lymphadenopathy.  Lungs:Clear bilaterally, no wheezes, rhonci, crackles Cardiovascular: Regular rate and rhythm. Systolic murmur.  Abdomen:Soft. Bowel sounds present. Non-tender.  Extremities: No lower extremity edema. Pulses are 2 + in the bilateral DP/PT.  EKG:  EKG is ordered today. The ekg ordered today demonstrates  EKG Interpretation Date/Time:  Wednesday September 07 2023 16:13:02 EDT Ventricular Rate:  100 PR Interval:  166 QRS Duration:  92 QT Interval:  340 QTC Calculation: 438 R Axis:   44  Text Interpretation: Normal sinus rhythm Normal ECG Confirmed by Verlin Bruckner [phone]) on 09/07/2023 4:14:59 PM    Recent Labs: 12/01/2022: Magnesium  2.2 06/21/2023: ALT 11; BUN 17; Creatinine, Ser 0.84; Hemoglobin 12.7; Platelets 423.0; Potassium 3.9; Sodium 137   Lipid Panel    Component Value Date/Time   CHOL 102 11/26/2022 0619   TRIG 122 11/26/2022 0619   TRIG 141 11/05/2005 1108   HDL 36 (L) 11/26/2022 0619   CHOLHDL 2.8 11/26/2022 0619   VLDL 24 11/26/2022 0619   LDLCALC 42 11/26/2022 0619   LDLDIRECT 137.0 12/10/2020 1454     Wt Readings from Last 3 Encounters:  09/07/23 236 lb 9.6 oz (107.3 kg)  08/16/23 230 lb 3.2 oz (104.4 kg)  08/08/23 230 lb (104.3 kg)    Assessment and Plan:   1. CAD s/p CABG without angina: No chest pain. Continue ASA, Plavix , statin and beta blocker.   2. HTN: BP is well  controlled. Continue current medications.   3. HLD: LDL 42 in November 2024. Continue Lipitor  and Zetia .   4. Bicuspid aortic valve: No evidence of aortic stenosis by echo in November 2024.   5. Pre-operative cardiovascular examination: Her cardiac disease is stable post bypass surgery. No chest pain suggestive of angina. She can proceed with her planned surgical procedure. OK to hold ASA and Plavix  one week before her procedure.   Labs/ tests ordered today include:   Orders Placed This Encounter  Procedures   EKG 12-Lead   Disposition:   F/U with me in 12 months   Signed, Bruckner Verlin, MD, [facility] 09/07/2023 4:55 PM    [facility] [address] [place], [place], [place]  [address] Phone: [phone]; Fax: [phone]

## 2023-09-08 NOTE — Telephone Encounter (Signed)
Received cardiology clearance

## 2023-09-15 ENCOUNTER — Telehealth: Payer: Self-pay | Admitting: *Deleted

## 2023-09-15 NOTE — Telephone Encounter (Signed)
 Spoke with Catherine Moon and relayed message from Catherine Epps, NP that it's ok for patient to hold her Aspirin  and Plavix  one week before her procedure with Catherine Moon on 10/21. Pt's last dose of ASA and Plavix  will be on Monday, October 13 th. And last dose of Mounjaro  will be on Thursday, October 9th. Since patient takes this every Thursday. Pt verbalized understanding and thanked the office for calling.

## 2023-09-28 ENCOUNTER — Encounter: Payer: Self-pay | Admitting: Psychiatry

## 2023-10-03 ENCOUNTER — Other Ambulatory Visit: Payer: Self-pay | Admitting: Internal Medicine

## 2023-10-03 NOTE — Telephone Encounter (Signed)
 Refill request complete

## 2023-10-19 NOTE — Patient Instructions (Signed)
 SURGICAL WAITING ROOM VISITATION  Patients having surgery or a procedure may have no more than 2 support people in the waiting area - these visitors may rotate.    Children under the age of 22 must have an adult with them who is not the patient.  Visitors with respiratory illnesses are discouraged from visiting and should remain at home.  If the patient needs to stay at the hospital during part of their recovery, the visitor guidelines for inpatient rooms apply. Pre-op nurse will coordinate an appropriate time for 1 support person to accompany patient in pre-op.  This support person may not rotate.    Please refer to the [facility] website for the visitor guidelines for Inpatients (after your surgery is over and you are in a regular room).    Your procedure is scheduled on: 10/25/23   Report to [facility] Main Entrance    Report to admitting at 8:15 AM   Call this number if you have problems the morning of surgery [phone]   Do not eat food :After Midnight.   After Midnight you may have the following liquids until 7:30 AM DAY OF SURGERY  Water Non-Citrus Juices (without pulp, NO RED-Apple, White grape, White cranberry) Black Coffee (NO MILK/CREAM OR CREAMERS, sugar ok)  Clear Tea (NO MILK/CREAM OR CREAMERS, sugar ok) regular and decaf                             Plain Jell-O (NO RED)                                           Fruit ices (not with fruit pulp, NO RED)                                     Popsicles (NO RED)                                                               Sports drinks like Gatorade (NO RED)                      If you have questions, please contact your surgeon's office.   FOLLOW BOWEL PREP AND ANY ADDITIONAL PRE OP INSTRUCTIONS YOU RECEIVED FROM YOUR SURGEON'S OFFICE!!!     Oral Hygiene is also important to reduce your risk of infection.                                    Remember - BRUSH YOUR TEETH THE MORNING OF SURGERY WITH YOUR  REGULAR TOOTHPASTE  DENTURES WILL BE REMOVED PRIOR TO SURGERY PLEASE DO NOT APPLY Poly grip OR ADHESIVES!!!   Stop all vitamins and herbal supplements 7 days before surgery.   Take these medicines the morning of surgery with A SIP OF WATER: Tylenol , Amlodipine , Atorvastatin , Zetia , Allegra, Claritin , Metoprolol    DO NOT TAKE ANY ORAL DIABETIC MEDICATIONS DAY OF YOUR SURGERY  How to Manage Your Diabetes Before  and After Surgery  Why is it important to control my blood sugar before and after surgery? Improving blood sugar levels before and after surgery helps healing and can limit problems. A way of improving blood sugar control is eating a healthy diet by:  Eating less sugar and carbohydrates  Increasing activity/exercise  Talking with your doctor about reaching your blood sugar goals High blood sugars (greater than 180 mg/dL) can raise your risk of infections and slow your recovery, so you will need to focus on controlling your diabetes during the weeks before surgery. Make sure that the doctor who takes care of your diabetes knows about your planned surgery including the date and location.  How do I manage my blood sugar before surgery? Check your blood sugar at least 4 times a day, starting 2 days before surgery, to make sure that the level is not too high or low. Check your blood sugar the morning of your surgery when you wake up and every 2 hours until you get to the Short Stay unit. If your blood sugar is less than 70 mg/dL, you will need to treat for low blood sugar: Do not take insulin . Treat a low blood sugar (less than 70 mg/dL) with  cup of clear juice (cranberry or apple), 4 glucose tablets, OR glucose gel. Recheck blood sugar in 15 minutes after treatment (to make sure it is greater than 70 mg/dL). If your blood sugar is not greater than 70 mg/dL on recheck, call [phone] for further instructions. Report your blood sugar to the short stay nurse when you get to Short  Stay.  If you are admitted to the hospital after surgery: Your blood sugar will be checked by the staff and you will probably be given insulin  after surgery (instead of oral diabetes medicines) to make sure you have good blood sugar levels. The goal for blood sugar control after surgery is 80-180 mg/dL.   WHAT DO I DO ABOUT MY DIABETES MEDICATION?  Do not take oral diabetes medicines (pills) the morning of surgery.  Hold Mounjaro  7 days prior to surgery. Do not take after 10/17/23.  THE DAY BEFORE SURGERY, take  Humalog  as prescribed.      THE MORNING OF SURGERY, do not take Humalog  as prescribed, unless blood sugar is greater than 220, then take 50%.  DO NOT TAKE THE FOLLOWING 7 DAYS PRIOR TO SURGERY: Ozempic , Wegovy, Rybelsus (Semaglutide ), Byetta (exenatide), Bydureon (exenatide ER), Victoza, Saxenda (liraglutide), or Trulicity (dulaglutide) Mounjaro  (Tirzepatide ) Adlyxin (Lixisenatide), Polyethylene Glycol Loxenatide.  If your CBG is greater than 220 mg/dL, you may take  of your sliding scale  (correction) dose of insulin .  Reviewed and Endorsed by [facility] Patient Education Committee, August 2015                              You may not have any metal on your body including hair pins, jewelry, and body piercing             Do not wear make-up, lotions, powders, perfumes, or deodorant  Do not wear nail polish including gel and S&S, artificial/acrylic nails, or any other type of covering on natural nails including finger and toenails. If you have artificial nails, gel coating, etc. that needs to be removed by a nail salon please have this removed prior to surgery or surgery may need to be canceled/ delayed if the surgeon/ anesthesia feels like they are unable to be safely  monitored.   Do not shave  48 hours prior to surgery.    Do not bring valuables to the hospital. [facility] IS NOT             RESPONSIBLE   FOR VALUABLES.   Contacts, glasses, dentures or bridgework may  not be worn into surgery.  DO NOT BRING YOUR HOME MEDICATIONS TO THE HOSPITAL. PHARMACY WILL DISPENSE MEDICATIONS LISTED ON YOUR MEDICATION LIST TO YOU DURING YOUR ADMISSION IN THE HOSPITAL!    Patients discharged on the day of surgery will not be allowed to drive home.  Someone NEEDS to stay with you for the first 24 hours after anesthesia.              Please read over the following fact sheets you were given: IF YOU HAVE QUESTIONS ABOUT YOUR PRE-OP INSTRUCTIONS PLEASE CALL [phone].   If you received a COVID test during your pre-op visit  it is requested that you wear a mask when out in public, stay away from anyone that may not be feeling well and notify your surgeon if you develop symptoms. If you test positive for Covid or have been in contact with anyone that has tested positive in the last 10 days please notify you surgeon.    [employer] - Preparing for Surgery Before surgery, you can play an important role.  Because skin is not sterile, your skin needs to be as free of germs as possible.  You can reduce the number of germs on your skin by washing with CHG (chlorahexidine gluconate) soap before surgery.  CHG is an antiseptic cleaner which kills germs and bonds with the skin to continue killing germs even after washing. Please DO NOT use if you have an allergy to CHG or antibacterial soaps.  If your skin becomes reddened/irritated stop using the CHG and inform your nurse when you arrive at Short Stay. Do not shave (including legs and underarms) for at least 48 hours prior to the first CHG shower.  You may shave your face/neck.  Please follow these instructions carefully:  1.  Shower with CHG Soap the night before surgery ONLY (DO NOT USE THE SOAP THE MORNING OF SURGERY).  2.  If you choose to wash your hair, wash your hair first as usual with your normal  shampoo.  3.  After you shampoo, rinse your hair and body thoroughly to remove the shampoo.                             4.   Use CHG as you would any other liquid soap.  You can apply chg directly to the skin and wash.  Gently with a scrungie or clean washcloth.  5.  Apply the CHG Soap to your body ONLY FROM THE NECK DOWN.   Do   not use on face/ open                           Wound or open sores. Avoid contact with eyes, ears mouth and   genitals (private parts).                       Wash face,  Genitals (private parts) with your normal soap.             6.  Wash thoroughly, paying special attention to the area where your  surgery  will be performed.  7.  Thoroughly rinse your body with warm water from the neck down.  8.  DO NOT shower/wash with your normal soap after using and rinsing off the CHG Soap.                9.  Pat yourself dry with a clean towel.            10.  Wear clean pajamas.            11.  Place clean sheets on your bed the night of your first shower and do not  sleep with pets. Day of Surgery : Do not apply any CHG, lotions/deodorants the morning of surgery.  Please wear clean clothes to the hospital/surgery center.  FAILURE TO FOLLOW THESE INSTRUCTIONS MAY RESULT IN THE CANCELLATION OF YOUR SURGERY  PATIENT SIGNATURE_________________________________  NURSE SIGNATURE__________________________________  ________________________________________________________________________ WHAT IS A BLOOD TRANSFUSION? Blood Transfusion Information  A transfusion is the replacement of blood or some of its parts. Blood is made up of multiple cells which provide different functions. Red blood cells carry oxygen and are used for blood loss replacement. White blood cells fight against infection. Platelets control bleeding. Plasma helps clot blood. Other blood products are available for specialized needs, such as hemophilia or other clotting disorders. BEFORE THE TRANSFUSION  Who gives blood for transfusions?  Healthy volunteers who are fully evaluated to make sure their blood is safe. This is blood bank  blood. Transfusion therapy is the safest it has ever been in the practice of medicine. Before blood is taken from a donor, a complete history is taken to make sure that person has no history of diseases nor engages in risky social behavior (examples are intravenous drug use or sexual activity with multiple partners). The donor's travel history is screened to minimize risk of transmitting infections, such as malaria. The donated blood is tested for signs of infectious diseases, such as HIV and hepatitis. The blood is then tested to be sure it is compatible with you in order to minimize the chance of a transfusion reaction. If you or a relative donates blood, this is often done in anticipation of surgery and is not appropriate for emergency situations. It takes many days to process the donated blood. RISKS AND COMPLICATIONS Although transfusion therapy is very safe and saves many lives, the main dangers of transfusion include:  Getting an infectious disease. Developing a transfusion reaction. This is an allergic reaction to something in the blood you were given. Every precaution is taken to prevent this. The decision to have a blood transfusion has been considered carefully by your caregiver before blood is given. Blood is not given unless the benefits outweigh the risks. AFTER THE TRANSFUSION Right after receiving a blood transfusion, you will usually feel much better and more energetic. This is especially true if your red blood cells have gotten low (anemic). The transfusion raises the level of the red blood cells which carry oxygen, and this usually causes an energy increase. The nurse administering the transfusion will monitor you carefully for complications. HOME CARE INSTRUCTIONS  No special instructions are needed after a transfusion. You may find your energy is better. Speak with your caregiver about any limitations on activity for underlying diseases you may have. SEEK MEDICAL CARE IF:  Your  condition is not improving after your transfusion. You develop redness or irritation at the intravenous (IV) site. SEEK IMMEDIATE MEDICAL CARE IF:  Any of the following symptoms occur  over the next 12 hours: Shaking chills. You have a temperature by mouth above 102 F (38.9 C), not controlled by medicine. Chest, back, or muscle pain. People around you feel you are not acting correctly or are confused. Shortness of breath or difficulty breathing. Dizziness and fainting. You get a rash or develop hives. You have a decrease in urine output. Your urine turns a dark color or changes to pink, red, or brown. Any of the following symptoms occur over the next 10 days: You have a temperature by mouth above 102 F (38.9 C), not controlled by medicine. Shortness of breath. Weakness after normal activity. The white part of the eye turns yellow (jaundice). You have a decrease in the amount of urine or are urinating less often. Your urine turns a dark color or changes to pink, red, or brown. Document Released: 12/19/1999 Document Revised: 03/15/2011 Document Reviewed: 08/07/2007 [facility] Patient Information 2014 [place], [place].  _______________________________________________________________________

## 2023-10-19 NOTE — Progress Notes (Signed)
 COVID Vaccine Completed: yes  Date of COVID positive in last 90 days:  PCP - Betty Swaziland, MD Cardiologist - Lonni Cash, MD Endocrinologist- Lela Fendt, MD  Cardiac clearance by Dr. Cash 09/07/23 Epic  Chest x-ray - 01/20/23 Epic EKG - 09/07/23 Epic Stress Test - N/A ECHO - 11/30/22 Epic Cardiac Cath - 11/26/22 Epic Pacemaker/ICD device last checked:N/A Spinal Cord Stimulator:N/A  Bowel Prep - N/A  Sleep Study - N/A CPAP -   Fasting Blood Sugar - N/A Checks Blood Sugar _____ times a day  Last dose of GLP1 agonist-  Mounjaro , takes on Thursdays  GLP1 instructions:  Do not take after     Last dose of SGLT-2 inhibitors-  N/A SGLT-2 instructions:  Do not take after     Blood Thinner Instructions: Plavix , hold 7 days Aspirin  Instructions: ASA 81, hold 7 days Last Dose:  Activity level:  Can go up a flight of stairs and perform activities of daily living without stopping and without symptoms of chest pain or shortness of breath.  Able to exercise without symptoms  Unable to go up a flight of stairs without symptoms of     Anesthesia review: HTN, HFrEF, NSTEMI, DM2, CABG x2, DM  Patient denies shortness of breath, fever, cough and chest pain at PAT appointment  Patient verbalized understanding of instructions that were given to them at the PAT appointment. Patient was also instructed that they will need to review over the PAT instructions again at home before surgery.

## 2023-10-20 ENCOUNTER — Encounter (HOSPITAL_COMMUNITY)
Admission: RE | Admit: 2023-10-20 | Discharge: 2023-10-20 | Disposition: A | Discharge status: Home / Self Care | Source: Ambulatory Visit | Attending: Psychiatry | Admitting: Psychiatry

## 2023-10-20 ENCOUNTER — Other Ambulatory Visit: Payer: Self-pay

## 2023-10-20 ENCOUNTER — Encounter (HOSPITAL_COMMUNITY): Payer: Self-pay

## 2023-10-20 VITALS — BP 154/63 | HR 96 | Temp 98.2°F | Resp 16 | Ht 66.0 in | Wt 238.0 lb

## 2023-10-20 DIAGNOSIS — Z01812 Encounter for preprocedural laboratory examination: Secondary | ICD-10-CM | POA: Diagnosis present

## 2023-10-20 DIAGNOSIS — N83201 Unspecified ovarian cyst, right side: Secondary | ICD-10-CM | POA: Insufficient documentation

## 2023-10-20 DIAGNOSIS — E113291 Type 2 diabetes mellitus with mild nonproliferative diabetic retinopathy without macular edema, right eye: Secondary | ICD-10-CM | POA: Insufficient documentation

## 2023-10-20 DIAGNOSIS — N83202 Unspecified ovarian cyst, left side: Secondary | ICD-10-CM | POA: Insufficient documentation

## 2023-10-20 DIAGNOSIS — Z794 Long term (current) use of insulin: Secondary | ICD-10-CM | POA: Insufficient documentation

## 2023-10-20 DIAGNOSIS — E119 Type 2 diabetes mellitus without complications: Secondary | ICD-10-CM | POA: Insufficient documentation

## 2023-10-20 HISTORY — DX: Acute myocardial infarction, unspecified: I21.9

## 2023-10-20 LAB — CBC
HCT: 44.1 % (ref 36.0–46.0)
Hemoglobin: 13.9 g/dL (ref 12.0–15.0)
MCH: 29.2 pg (ref 26.0–34.0)
MCHC: 31.5 g/dL (ref 30.0–36.0)
MCV: 92.6 fL (ref 80.0–100.0)
Platelets: 362 K/uL (ref 150–400)
RBC: 4.76 MIL/uL (ref 3.87–5.11)
RDW: 14.1 % (ref 11.5–15.5)
WBC: 12.1 K/uL — ABNORMAL HIGH (ref 4.0–10.5)
nRBC: 0 % (ref 0.0–0.2)

## 2023-10-20 LAB — COMPREHENSIVE METABOLIC PANEL WITH GFR
ALT: 17 U/L (ref 0–44)
AST: 20 U/L (ref 15–41)
Albumin: 4 g/dL (ref 3.5–5.0)
Alkaline Phosphatase: 100 U/L (ref 38–126)
Anion gap: 11 (ref 5–15)
BUN: 14 mg/dL (ref 8–23)
CO2: 24 mmol/L (ref 22–32)
Calcium: 9.1 mg/dL (ref 8.9–10.3)
Chloride: 104 mmol/L (ref 98–111)
Creatinine, Ser: 0.85 mg/dL (ref 0.44–1.00)
GFR, Estimated: >60 mL/min (ref >60)
Glucose, Bld: 321 mg/dL — ABNORMAL HIGH (ref 70–99)
Potassium: 4.1 mmol/L (ref 3.5–5.1)
Sodium: 139 mmol/L (ref 135–145)
Total Bilirubin: 0.4 mg/dL (ref 0.0–1.2)
Total Protein: 6.7 g/dL (ref 6.5–8.1)

## 2023-10-20 LAB — GLUCOSE, CAPILLARY: Glucose-Capillary: 342 mg/dL — ABNORMAL HIGH (ref 70–99)

## 2023-10-20 LAB — HEMOGLOBIN A1C
Hgb A1c MFr Bld: 7 % — ABNORMAL HIGH (ref 4.8–5.6)
Mean Plasma Glucose: 154.2 mg/dL

## 2023-10-21 ENCOUNTER — Telehealth: Payer: Self-pay | Admitting: *Deleted

## 2023-10-21 NOTE — Progress Notes (Signed)
 Anesthesia Chart Review   Case: [id number] Date/Time: 10/25/23 1018   Procedure: SALPINGO-OOPHORECTOMY, BILATERAL, ROBOT-ASSISTED (Bilateral)   Anesthesia type: General   Diagnosis: Bilateral ovarian cysts [N83.201, N83.202]   Pre-op diagnosis: BILATERAL OVARIAN CYSTS   Location: [facility] ROOM 05 / [facility]   Surgeons: Eldonna Mays, MD       DISCUSSION:63 y.o. former smoker with h/o HTN, CAD s/p CABG, chronic diastolic CHF, DM II, bilateral ovarian cysts scheduled for above procedure 10/25/23 with Dr. Mays Eldonna.   Elevated glucose at PAT visit, non-fasting.  A1C 7.0, evaluate glucose DOS.   Pt last seen by cardiology 09/07/23. Per OV note, Her cardiac disease is stable post bypass surgery. No chest pain suggestive of angina. She can proceed with her planned surgical procedure. OK to hold ASA and Plavix  one week before her procedure.   Last dose of Plavix  10/17/2023.   Pt advised to hold Mounjaro  1 week prior to surgery.  VS: BP (!) 154/63   Pulse 96   Temp 36.8 C (Oral)   Resp 16   Ht 5' 6 (1.676 m)   Wt 108 kg   SpO2 97%   BMI 38.41 kg/m   PROVIDERS: Swaziland, Betty G, MD is PCP   Verlin Lonni BIRCH, MD is Cardiologist  LABS: Labs reviewed: Acceptable for surgery. (all labs ordered are listed, but only abnormal results are displayed)  Labs Reviewed  CBC - Abnormal; Notable for the following components:      Result Value   WBC 12.1 (*)    All other components within normal limits  COMPREHENSIVE METABOLIC PANEL WITH GFR - Abnormal; Notable for the following components:   Glucose, Bld 321 (*)    All other components within normal limits  HEMOGLOBIN A1C - Abnormal; Notable for the following components:   Hgb A1c MFr Bld 7.0 (*)    All other components within normal limits  GLUCOSE, CAPILLARY - Abnormal; Notable for the following components:   Glucose-Capillary 342 (*)    All other components within normal limits  TYPE AND SCREEN      IMAGES:   EKG:   CV: Echo 11/26/22 1. Left ventricular ejection fraction, by estimation, is 55 to 60%. The  left ventricle has normal function. The left ventricle has no regional  wall motion abnormalities. There is moderate asymmetric left ventricular  hypertrophy of the basal-septal  segment. Left ventricular diastolic parameters were normal.   2. Right ventricular systolic function is normal. The right ventricular  size is normal.   3. The mitral valve is normal in structure. Trivial mitral valve  regurgitation. No evidence of mitral stenosis. Moderate to severe mitral  annular calcification.   4. Functionally bicuspid aortic valve with calcification/partial fusion  of LCC and NCC. The aortic valve is abnormal. There is moderate  calcification of the aortic valve. There is moderate thickening of the  aortic valve. Aortic valve regurgitation is  trivial. Aortic valve sclerosis/calcification is present, without any  evidence of aortic stenosis.   Comparison(s): No significant change from prior study.  Past Medical History:  Diagnosis Date   (HFpEF) heart failure with preserved ejection fraction (HCC)    Anticoagulated    plavix  and asa 81mg   ---- managed by cardiology   Aortic stenosis due to bicuspid aortic valve    per intraoperative TEE 11-30-2023  aortic bicuspid valve w/ mild stenosis,  area 1.46   Clotting disorder 09/26/22   Postmenopausal bl   Coronary artery disease 11/2022   cardiologist--- dr  mcalhany;   NSTEMI 11-26-2022  cath w/ severe 2 vessel CAD ;   11-30-2022  s/p CABG x2   CTS (carpal tunnel syndrome)    Diverticulosis of colon    GERD (gastroesophageal reflux disease)    Heart murmur    had ECHO 01-2021   Hemorrhoids    interenal and external   History of adenomatous polyp of colon    History of non-ST elevation myocardial infarction (NSTEMI)    HTN (hypertension)    Hyperlipidemia, mixed    IDA (iron deficiency anemia)    Insulin  dependent  type 2 diabetes mellitus [facility]) 1997   endocrinologist--- dr trixie;   dx 1997 DM2;  started insulin  in 2010   Mild nonproliferative diabetic retinopathy without macular edema associated with type 2 diabetes mellitus (HCC)    followed gy dr abigail;   both eyes   Myocardial infarction (HCC)    PMB (postmenopausal bleeding)    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   LIMA--LAD;   RSVG--PLV  by dr lightfoot   Seasonal allergies    Thickened endometrium    Warts, genital    Wears glasses     Past Surgical History:  Procedure Laterality Date   COLONOSCOPY WITH PROPOFOL   02/17/2015   dr glo   CORONARY ARTERY BYPASS GRAFT N/A 11/30/2022   Procedure: CORONARY ARTERY BYPASS GRAFTING X 2 , USING LEFT INTERNAL MAMMARY ARTERY AND ENDOSCOPICALLY HARVESTED RIGHT SAPHENOUS VEIN GRAFT;  Surgeon: Shyrl Linnie KIDD, MD;  Location: [facility];  Service: Open Heart Surgery;  Laterality: N/A;   CYST EXCISION Right 03/26/2021   Procedure: RIGHT AXILLARY SEBACEOUS CYST EXCISION;  Surgeon: Ebbie Cough, MD;  Location: [facility];  Service: General;  Laterality: Right;  LOCAL   DILATATION & CURETTAGE/HYSTEROSCOPY WITH MYOSURE N/A 03/17/2023   Procedure: DILATATION & CURETTAGE/HYSTEROSCOPY WITH MYOSURE;  Surgeon: D'Iorio, Hadassah LABOR, MD;  Location: [facility] OR;  Service: Gynecology;  Laterality: N/A;  [facility]   DILATION AND CURETTAGE OF UTERUS  1977   LEFT HEART CATH AND CORONARY ANGIOGRAPHY N/A 11/26/2022   Procedure: LEFT HEART CATH AND CORONARY ANGIOGRAPHY;  Surgeon: Darron Deatrice LABOR, MD;  Location: [facility] INVASIVE CV LAB;  Service: Cardiovascular;  Laterality: N/A;   TEE WITHOUT CARDIOVERSION N/A 11/30/2022   Procedure: TRANSESOPHAGEAL ECHOCARDIOGRAM (TEE);  Surgeon: Shyrl Linnie KIDD, MD;  Location[facility] OR;  Service: Open Heart Surgery;  Laterality: N/A;   WISDOM TOOTH EXTRACTION      MEDICATIONS:  acetaminophen  (TYLENOL ) 500 MG tablet   amLODipine  (NORVASC ) 5 MG tablet   aspirin  81 MG EC tablet    atorvastatin  (LIPITOR ) 80 MG tablet   Black Elderberry 1000 MG CAPS   calcium  carbonate (OSCAL) 1500 (600 Ca) MG TABS tablet   cholecalciferol (VITAMIN D3) 25 MCG (1000 UNIT) tablet   clopidogrel  (PLAVIX ) 75 MG tablet   Continuous Glucose Sensor (DEXCOM G7 SENSOR) MISC   Continuous Glucose Sensor (FREESTYLE LIBRE 2 PLUS SENSOR) MISC   Cranberry 4200 MG CAPS   docusate sodium  (COLACE) 250 MG capsule   ezetimibe  (ZETIA ) 10 MG tablet   famotidine (PEPCID) 20 MG tablet   ferrous sulfate  325 (65 FE) MG tablet   fexofenadine (ALLEGRA) 180 MG tablet   Ginger, Zingiber officinalis, (GINGER ROOT) 550 MG CAPS   Green Tea 315 MG CAPS   ibuprofen (ADVIL) 200 MG tablet   Insulin  Pen Needle 32G X 4 MM MISC   insulin  regular human CONCENTRATED (HUMULIN  R U-500 KWIKPEN) 500 UNIT/ML KwikPen   loratadine  (CLARITIN )  10 MG tablet   Magnesium  Oxide 250 MG TABS   metoprolol  tartrate (LOPRESSOR ) 50 MG tablet   Multiple Vitamin (MULTIVITAMIN WITH MINERALS) TABS tablet   Multiple Vitamins-Minerals (HAIR SKIN & NAILS) TABS   Multiple Vitamins-Minerals (PRESERVISION AREDS PO)   Potassium 99 MG TABS   Probiotic Product (PROBIOTIC DAILY PO)   sodium chloride  (OCEAN) 0.65 % SOLN nasal spray   tirzepatide  (MOUNJARO ) 12.5 MG/0.5ML Pen   zinc gluconate 50 MG tablet   No current facility-administered medications for this encounter.    Harlene Hoots Ward, PA-C [facility] [phone]

## 2023-10-21 NOTE — Anesthesia Preprocedure Evaluation (Signed)
 Anesthesia Evaluation  Patient identified by MRN, date of birth, ID band Patient awake    Reviewed: Allergy & Precautions, H&P , NPO status , Patient's Chart, lab work & pertinent test results  Airway Mallampati: II  TM Distance: >3 FB Neck ROM: Full    Dental no notable dental hx.    Pulmonary former smoker   Pulmonary exam normal breath sounds clear to auscultation       Cardiovascular hypertension, + CAD, + Past MI and + CABG  Normal cardiovascular exam Rhythm:Regular Rate:Normal     Neuro/Psych negative neurological ROS  negative psych ROS   GI/Hepatic Neg liver ROS,GERD  ,,  Endo/Other  diabetes, Type 2    Renal/GU negative Renal ROS  negative genitourinary   Musculoskeletal negative musculoskeletal ROS (+)    Abdominal  (+) + obese  Peds negative pediatric ROS (+)  Hematology   Anesthesia Other Findings   Reproductive/Obstetrics negative OB ROS                              Anesthesia Physical Anesthesia Plan  ASA: 3  Anesthesia Plan: General   Post-op Pain Management:    Induction: Intravenous  PONV Risk Score and Plan: 3 and Ondansetron , Dexamethasone , Treatment may vary due to age or medical condition and Midazolam   Airway Management Planned: Oral ETT  Additional Equipment:   Intra-op Plan:   Post-operative Plan: Extubation in OR  Informed Consent: I have reviewed the patients History and Physical, chart, labs and discussed the procedure including the risks, benefits and alternatives for the proposed anesthesia with the patient or authorized representative who has indicated his/her understanding and acceptance.     Dental advisory given  Plan Discussed with: CRNA  Anesthesia Plan Comments: (See PAT note 10/20/2023)         Anesthesia Quick Evaluation

## 2023-10-21 NOTE — Telephone Encounter (Signed)
-----   Message from Catherine Moon sent at 10/21/2023 10:03 AM EDT ----- Please let her know to monitor her blood sugars and intake of sugary/simple carb foods. Her glucose at her preop visit was 321. A1C came back at 7. When glucose is elevated can increase risk for complications and affect healing so would keep monitoring.

## 2023-10-21 NOTE — Telephone Encounter (Signed)
 Spoke with Catherine Moon and relayed message from [name], NP that patient's pre-op visit glucose was 321. A1C came back at 7. Advised patient to monitor her blood sugars and intake of sugary/simple carb foods. When glucose is elevated can increase risk for complications and affect healing so would keep monitoring. Pt verbalized understanding and thanked the office for calling.

## 2023-10-23 ENCOUNTER — Other Ambulatory Visit: Payer: Self-pay | Admitting: Adult Health

## 2023-10-24 ENCOUNTER — Other Ambulatory Visit: Payer: Self-pay | Admitting: Gynecologic Oncology

## 2023-10-24 ENCOUNTER — Telehealth: Payer: Self-pay | Admitting: *Deleted

## 2023-10-24 DIAGNOSIS — N8500 Endometrial hyperplasia, unspecified: Secondary | ICD-10-CM

## 2023-10-24 MED ORDER — TRAMADOL HCL 50 MG PO TABS: 50.0000 mg | ORAL_TABLET | Freq: Four times a day (QID) | ORAL | 0 refills | Status: DC | PRN

## 2023-10-24 MED ORDER — SENNOSIDES-DOCUSATE SODIUM 8.6-50 MG PO TABS: 2.0000 | ORAL_TABLET | Freq: Every day | ORAL | 0 refills | Status: AC

## 2023-10-24 NOTE — Telephone Encounter (Signed)
Attempted to reach patient for pre-op call. Left voicemail requesting call back to [phone].

## 2023-10-24 NOTE — Telephone Encounter (Signed)
 Telephone call to check on pre-operative status.  Patient compliant with pre-operative instructions.  Reinforced nothing to eat after midnight. Clear liquids until 0715. Patient to arrive at 0815.  No questions or concerns voiced.  Instructed to call for any needs.

## 2023-10-24 NOTE — Discharge Instructions (Signed)
 AFTER SURGERY INSTRUCTIONS   Return to work: 1-2 weeks if applicable   Activity: 1. Be up and out of the bed during the day.  Take a nap if needed.  You may walk up steps but be careful and use the hand rail.  Stair climbing will tire you more than you think, you may need to stop part way and rest.    2. No lifting or straining for 4-6 weeks over 10 pounds. No pushing, pulling, straining for 6 weeks.   3. No driving for 4-89 days when the following criteria have been met: Do not drive if you are taking narcotic pain medicine and make sure that your reaction time has returned.    4. You can shower as soon as the next day after surgery. Shower daily.  Use your regular soap and water (not directly on the incision) and pat your incision(s) dry afterwards; don't rub.  No tub baths or submerging your body in water until cleared by your surgeon. If you have the soap that was given to you by pre-surgical testing that was used before surgery, you do not need to use it afterwards because this can irritate your incisions.    5. No sexual activity and nothing in the vagina for 4-6 weeks.   6. You may experience a small amount of clear drainage from your incisions, which is normal.  If the drainage persists, increases, or changes color please call the office.   7. Do not use creams, lotions, or ointments such as neosporin on your incisions after surgery until advised by your surgeon because they can cause removal of the dermabond glue on your incisions.     8. You may experience vaginal spotting after surgery or when the stitches at the top of the vagina begin to dissolve.  The spotting is normal but if you experience heavy bleeding, call our office.   9. Take Tylenol  or ibuprofen first for pain if you are able to take these medications and only use narcotic pain medication for severe pain not relieved by the Tylenol  or Ibuprofen.  Monitor your Tylenol  intake to a max of 4,000 mg in a 24 hour period. You can  alternate these medications after surgery.   Diet: 1. Low sodium Heart Healthy Diet is recommended but you are cleared to resume your normal (before surgery) diet after your procedure.   2. It is safe to use a laxative, such as Miralax or Colace, if you have difficulty moving your bowels before surgery. You have been prescribed Sennakot-S to take at bedtime every evening after surgery to keep bowel movements regular and to prevent constipation.     Wound Care: 1. Keep clean and dry.  Shower daily.   Reasons to call the Doctor: Fever - Oral temperature greater than 100.4 degrees Fahrenheit Foul-smelling vaginal discharge Difficulty urinating Nausea and vomiting Increased pain at the site of the incision that is unrelieved with pain medicine. Difficulty breathing with or without chest pain New calf pain especially if only on one side Sudden, continuing increased vaginal bleeding with or without clots.   Contacts: For questions or concerns you should contact:   Dr. Hoy Masters at [phone]   [name], NP at [phone]   After Hours: call [phone] and have the GYN Oncologist paged/contacted (after 5 pm or on the weekends). You will speak with an after hours RN and let he or she know you have had surgery.   Messages sent via mychart are for non-urgent  matters and are not responded to after hours so for urgent needs, please call the after hours number.

## 2023-10-25 ENCOUNTER — Encounter (HOSPITAL_COMMUNITY)
Admission: RE | Disposition: A | Discharge status: Home / Self Care | Payer: Self-pay | Source: Ambulatory Visit | Attending: Psychiatry

## 2023-10-25 ENCOUNTER — Ambulatory Visit (HOSPITAL_COMMUNITY): Admitting: Certified Registered Nurse Anesthetist

## 2023-10-25 ENCOUNTER — Ambulatory Visit (HOSPITAL_COMMUNITY): Payer: Self-pay | Admitting: Physician Assistant

## 2023-10-25 ENCOUNTER — Ambulatory Visit (HOSPITAL_COMMUNITY)
Admission: RE | Admit: 2023-10-25 | Discharge: 2023-10-25 | Disposition: A | Discharge status: Home / Self Care | Source: Ambulatory Visit | Attending: Psychiatry | Admitting: Psychiatry

## 2023-10-25 ENCOUNTER — Encounter (HOSPITAL_COMMUNITY): Payer: Self-pay | Admitting: Psychiatry

## 2023-10-25 DIAGNOSIS — D271 Benign neoplasm of left ovary: Secondary | ICD-10-CM | POA: Diagnosis not present

## 2023-10-25 DIAGNOSIS — E113293 Type 2 diabetes mellitus with mild nonproliferative diabetic retinopathy without macular edema, bilateral: Secondary | ICD-10-CM | POA: Diagnosis not present

## 2023-10-25 DIAGNOSIS — I252 Old myocardial infarction: Secondary | ICD-10-CM | POA: Insufficient documentation

## 2023-10-25 DIAGNOSIS — I5032 Chronic diastolic (congestive) heart failure: Secondary | ICD-10-CM | POA: Insufficient documentation

## 2023-10-25 DIAGNOSIS — Z794 Long term (current) use of insulin: Secondary | ICD-10-CM | POA: Insufficient documentation

## 2023-10-25 DIAGNOSIS — N83201 Unspecified ovarian cyst, right side: Secondary | ICD-10-CM

## 2023-10-25 DIAGNOSIS — D270 Benign neoplasm of right ovary: Secondary | ICD-10-CM | POA: Diagnosis not present

## 2023-10-25 DIAGNOSIS — E669 Obesity, unspecified: Secondary | ICD-10-CM | POA: Diagnosis not present

## 2023-10-25 DIAGNOSIS — N838 Other noninflammatory disorders of ovary, fallopian tube and broad ligament: Secondary | ICD-10-CM | POA: Insufficient documentation

## 2023-10-25 DIAGNOSIS — Z6838 Body mass index (BMI) 38.0-38.9, adult: Secondary | ICD-10-CM | POA: Diagnosis not present

## 2023-10-25 DIAGNOSIS — I110 Hypertensive heart disease with heart failure: Secondary | ICD-10-CM | POA: Insufficient documentation

## 2023-10-25 DIAGNOSIS — Z87891 Personal history of nicotine dependence: Secondary | ICD-10-CM | POA: Diagnosis not present

## 2023-10-25 DIAGNOSIS — I2510 Atherosclerotic heart disease of native coronary artery without angina pectoris: Secondary | ICD-10-CM | POA: Insufficient documentation

## 2023-10-25 DIAGNOSIS — Z951 Presence of aortocoronary bypass graft: Secondary | ICD-10-CM | POA: Insufficient documentation

## 2023-10-25 DIAGNOSIS — N83202 Unspecified ovarian cyst, left side: Secondary | ICD-10-CM | POA: Diagnosis present

## 2023-10-25 HISTORY — PX: ROBOTIC ASSISTED BILATERAL SALPINGO OOPHERECTOMY: SHX6078

## 2023-10-25 LAB — GLUCOSE, CAPILLARY
Glucose-Capillary: 183 mg/dL — ABNORMAL HIGH (ref 70–99)
Glucose-Capillary: 222 mg/dL — ABNORMAL HIGH (ref 70–99)

## 2023-10-25 LAB — TYPE AND SCREEN
ABO/RH(D): O POS
Antibody Screen: NEGATIVE

## 2023-10-25 SURGERY — SALPINGO-OOPHORECTOMY, BILATERAL, ROBOT-ASSISTED
Anesthesia: General | Laterality: Bilateral

## 2023-10-25 MED ORDER — ORAL CARE MOUTH RINSE: 15.0000 mL | Freq: Once | OROMUCOSAL | Status: AC

## 2023-10-25 MED ORDER — ACETAMINOPHEN 10 MG/ML IV SOLN
INTRAVENOUS | Status: DC | PRN
Start: 2023-10-25 — End: 2023-10-25
  Administered 2023-10-25: 1000 mg via INTRAVENOUS

## 2023-10-25 MED ORDER — ROCURONIUM BROMIDE 10 MG/ML (PF) SYRINGE
PREFILLED_SYRINGE | INTRAVENOUS | Status: AC
  Filled 2023-10-25: qty 10

## 2023-10-25 MED ORDER — INSULIN ASPART 100 UNIT/ML IJ SOLN
0.0000 [IU] | INTRAMUSCULAR | Status: DC | PRN
  Administered 2023-10-25: 4 [IU] via SUBCUTANEOUS
  Filled 2023-10-25: qty 1

## 2023-10-25 MED ORDER — SUGAMMADEX SODIUM 200 MG/2ML IV SOLN
INTRAVENOUS | Status: DC | PRN
Start: 2023-10-25 — End: 2023-10-25
  Administered 2023-10-25: 200 mg via INTRAVENOUS

## 2023-10-25 MED ORDER — MIDAZOLAM HCL 2 MG/2ML IJ SOLN
INTRAMUSCULAR | Status: AC
Start: 2023-10-25 — End: 2023-10-25
  Filled 2023-10-25: qty 2

## 2023-10-25 MED ORDER — DEXAMETHASONE SOD PHOSPHATE PF 10 MG/ML IJ SOLN
INTRAMUSCULAR | Status: DC | PRN
  Administered 2023-10-25: 5 mg via INTRAVENOUS

## 2023-10-25 MED ORDER — LACTATED RINGERS IR SOLN
Status: DC | PRN
  Administered 2023-10-25: 1000 mL

## 2023-10-25 MED ORDER — AMISULPRIDE (ANTIEMETIC) 5 MG/2ML IV SOLN: 10.0000 mg | Freq: Once | INTRAVENOUS | Status: DC | PRN

## 2023-10-25 MED ORDER — SUGAMMADEX SODIUM 200 MG/2ML IV SOLN
INTRAVENOUS | Status: AC
  Filled 2023-10-25: qty 2

## 2023-10-25 MED ORDER — OXYCODONE HCL 5 MG/5ML PO SOLN: 5.0000 mg | Freq: Once | ORAL | Status: AC | PRN

## 2023-10-25 MED ORDER — LACTATED RINGERS IV SOLN: INTRAVENOUS | Status: DC

## 2023-10-25 MED ORDER — LIDOCAINE HCL (PF) 2 % IJ SOLN
INTRAMUSCULAR | Status: AC
  Filled 2023-10-25: qty 5

## 2023-10-25 MED ORDER — ROCURONIUM BROMIDE 100 MG/10ML IV SOLN
INTRAVENOUS | Status: DC | PRN
  Administered 2023-10-25: 10 mg via INTRAVENOUS
  Administered 2023-10-25: 100 mg via INTRAVENOUS
  Administered 2023-10-25: 20 mg via INTRAVENOUS

## 2023-10-25 MED ORDER — MIDAZOLAM HCL 5 MG/5ML IJ SOLN
INTRAMUSCULAR | Status: DC | PRN
  Administered 2023-10-25: 2 mg via INTRAVENOUS

## 2023-10-25 MED ORDER — BUPIVACAINE HCL (PF) 0.25 % IJ SOLN
INTRAMUSCULAR | Status: AC
  Filled 2023-10-25: qty 30

## 2023-10-25 MED ORDER — BUPIVACAINE HCL 0.25 % IJ SOLN
INTRAMUSCULAR | Status: DC | PRN
  Administered 2023-10-25: 30 mL

## 2023-10-25 MED ORDER — HEPARIN SODIUM (PORCINE) 5000 UNIT/ML IJ SOLN
5000.0000 [IU] | INTRAMUSCULAR | Status: AC
  Administered 2023-10-25: 5000 [IU] via SUBCUTANEOUS
  Filled 2023-10-25: qty 1

## 2023-10-25 MED ORDER — ACETAMINOPHEN 500 MG PO TABS: 1000.0000 mg | ORAL_TABLET | ORAL | Status: DC

## 2023-10-25 MED ORDER — PHENYLEPHRINE HCL-NACL 20-0.9 MG/250ML-% IV SOLN
INTRAVENOUS | Status: DC | PRN
  Administered 2023-10-25: 40 ug/min via INTRAVENOUS

## 2023-10-25 MED ORDER — KETOROLAC TROMETHAMINE 30 MG/ML IJ SOLN
INTRAMUSCULAR | Status: AC
  Filled 2023-10-25: qty 1

## 2023-10-25 MED ORDER — ONDANSETRON HCL 4 MG/2ML IJ SOLN
INTRAMUSCULAR | Status: AC
  Filled 2023-10-25: qty 2

## 2023-10-25 MED ORDER — FENTANYL CITRATE (PF) 100 MCG/2ML IJ SOLN
INTRAMUSCULAR | Status: AC
  Filled 2023-10-25: qty 2

## 2023-10-25 MED ORDER — MEPERIDINE HCL 100 MG/ML IJ SOLN: 6.2500 mg | INTRAMUSCULAR | Status: DC | PRN

## 2023-10-25 MED ORDER — CHLORHEXIDINE GLUCONATE 0.12 % MT SOLN
15.0000 mL | Freq: Once | OROMUCOSAL | Status: AC
  Administered 2023-10-25: 15 mL via OROMUCOSAL

## 2023-10-25 MED ORDER — PROPOFOL 10 MG/ML IV BOLUS
INTRAVENOUS | Status: DC | PRN
  Administered 2023-10-25: 150 mg via INTRAVENOUS

## 2023-10-25 MED ORDER — PROPOFOL 10 MG/ML IV BOLUS
INTRAVENOUS | Status: AC
  Filled 2023-10-25: qty 20

## 2023-10-25 MED ORDER — ACETAMINOPHEN 10 MG/ML IV SOLN
INTRAVENOUS | Status: AC
  Filled 2023-10-25: qty 100

## 2023-10-25 MED ORDER — KETOROLAC TROMETHAMINE 30 MG/ML IJ SOLN
INTRAMUSCULAR | Status: DC | PRN
  Administered 2023-10-25: 30 mg via INTRAVENOUS

## 2023-10-25 MED ORDER — OXYCODONE HCL 5 MG PO TABS
5.0000 mg | ORAL_TABLET | Freq: Once | ORAL | Status: AC | PRN
  Administered 2023-10-25: 5 mg via ORAL

## 2023-10-25 MED ORDER — FENTANYL CITRATE (PF) 100 MCG/2ML IJ SOLN
INTRAMUSCULAR | Status: DC | PRN
  Administered 2023-10-25 (×2): 50 ug via INTRAVENOUS

## 2023-10-25 MED ORDER — DEXAMETHASONE SOD PHOSPHATE PF 10 MG/ML IJ SOLN: 4.0000 mg | INTRAMUSCULAR | Status: DC

## 2023-10-25 MED ORDER — OXYCODONE HCL 5 MG PO TABS
ORAL_TABLET | ORAL | Status: AC
  Filled 2023-10-25: qty 1

## 2023-10-25 MED ORDER — LIDOCAINE HCL (CARDIAC) PF 100 MG/5ML IV SOSY
PREFILLED_SYRINGE | INTRAVENOUS | Status: DC | PRN
  Administered 2023-10-25: 80 mg via INTRAVENOUS

## 2023-10-25 MED ORDER — ONDANSETRON HCL 4 MG/2ML IJ SOLN
INTRAMUSCULAR | Status: DC | PRN
  Administered 2023-10-25: 4 mg via INTRAVENOUS

## 2023-10-25 MED ORDER — HYDROMORPHONE HCL 1 MG/ML IJ SOLN
0.2500 mg | INTRAMUSCULAR | Status: DC | PRN
  Administered 2023-10-25: 0.5 mg via INTRAVENOUS

## 2023-10-25 MED ORDER — PHENYLEPHRINE HCL (PRESSORS) 10 MG/ML IV SOLN
INTRAVENOUS | Status: DC | PRN
  Administered 2023-10-25: 80 ug via INTRAVENOUS
  Administered 2023-10-25: 160 ug via INTRAVENOUS
  Administered 2023-10-25 (×2): 80 ug via INTRAVENOUS

## 2023-10-25 MED ORDER — HYDROMORPHONE HCL 1 MG/ML IJ SOLN
INTRAMUSCULAR | Status: AC
  Filled 2023-10-25: qty 1

## 2023-10-25 MED ORDER — STERILE WATER FOR IRRIGATION IR SOLN
Status: DC | PRN
Start: 2023-10-25 — End: 2023-10-25
  Administered 2023-10-25: 1000 mL

## 2023-10-25 SURGICAL SUPPLY — 69 items
BAG LAPAROSCOPIC 12 15 PORT 16 (BASKET) IMPLANT
BLADE SURG SZ10 CARB STEEL (BLADE) IMPLANT
COVER BACK TABLE 60X90IN (DRAPES) ×2 IMPLANT
COVER TIP SHEARS 8 DVNC (MISCELLANEOUS) ×2 IMPLANT
DERMABOND ADVANCED .7 DNX12 (GAUZE/BANDAGES/DRESSINGS) ×2 IMPLANT
DRAPE ARM DVNC X/XI (DISPOSABLE) ×8 IMPLANT
DRAPE COLUMN DVNC XI (DISPOSABLE) ×2 IMPLANT
DRAPE SHEET LG 3/4 BI-LAMINATE (DRAPES) ×2 IMPLANT
DRAPE SURG IRRIG POUCH 19X23 (DRAPES) ×2 IMPLANT
DRIVER NDL MEGA SUTCUT DVNCXI (INSTRUMENTS) ×2 IMPLANT
DRIVER NDLE MEGA SUTCUT DVNCXI (INSTRUMENTS) ×1 IMPLANT
DRSG OPSITE POSTOP 4X6 (GAUZE/BANDAGES/DRESSINGS) IMPLANT
DRSG OPSITE POSTOP 4X8 (GAUZE/BANDAGES/DRESSINGS) IMPLANT
ELECT PENCIL ROCKER SW 15FT (MISCELLANEOUS) IMPLANT
ELECT REM PT RETURN 15FT ADLT (MISCELLANEOUS) ×2 IMPLANT
FORCEPS BPLR FENES DVNC XI (FORCEP) ×2 IMPLANT
FORCEPS PROGRASP DVNC XI (FORCEP) ×2 IMPLANT
GAUZE 4X4 16PLY ~~LOC~~+RFID DBL (SPONGE) ×2 IMPLANT
GLOVE BIO SURGEON STRL SZ 6.5 (GLOVE) ×2 IMPLANT
GLOVE BIOGEL PI IND STRL 6.5 (GLOVE) ×4 IMPLANT
GLOVE BIOGEL PI MICRO STRL 6 (GLOVE) ×8 IMPLANT
GOWN STRL REUS W/ TWL LRG LVL3 (GOWN DISPOSABLE) ×8 IMPLANT
GRASPER SUT TROCAR 14GX15 (MISCELLANEOUS) IMPLANT
HOLDER FOLEY CATH W/STRAP (MISCELLANEOUS) IMPLANT
IRRIGATION SUCT STRKRFLW 2 WTP (MISCELLANEOUS) ×2 IMPLANT
KIT PROCEDURE DVNC SI (MISCELLANEOUS) IMPLANT
KIT TURNOVER KIT A (KITS) ×2 IMPLANT
LIGASURE IMPACT 36 18CM CVD LR (INSTRUMENTS) IMPLANT
MANIPULATOR ADVINCU DEL 3.0 PL (MISCELLANEOUS) IMPLANT
MANIPULATOR ADVINCU DEL 3.5 PL (MISCELLANEOUS) IMPLANT
MANIPULATOR UTERINE 4.5 ZUMI (MISCELLANEOUS) IMPLANT
NDL HYPO 21X1.5 SAFETY (NEEDLE) ×2 IMPLANT
NDL INSUFFLATION 14GA 120MM (NEEDLE) IMPLANT
NDL SPNL 20GX3.5 QUINCKE YW (NEEDLE) IMPLANT
NEEDLE HYPO 21X1.5 SAFETY (NEEDLE) ×1 IMPLANT
NEEDLE INSUFFLATION 14GA 120MM (NEEDLE) IMPLANT
NEEDLE SPNL 20GX3.5 QUINCKE YW (NEEDLE) IMPLANT
OBTURATOR OPTICALSTD 8 DVNC (TROCAR) ×2 IMPLANT
PACK ROBOT GYN CUSTOM WL (TRAY / TRAY PROCEDURE) ×2 IMPLANT
PAD ARMBOARD POSITIONER FOAM (MISCELLANEOUS) ×2 IMPLANT
PAD POSITIONING PINK XL (MISCELLANEOUS) ×2 IMPLANT
PORT ACCESS TROCAR AIRSEAL 12 (TROCAR) IMPLANT
POWDER SURGICEL 3.0 GRAM (HEMOSTASIS) IMPLANT
SCISSORS LAP 5X45 EPIX DISP (ENDOMECHANICALS) IMPLANT
SCISSORS MNPLR CVD DVNC XI (INSTRUMENTS) ×2 IMPLANT
SCRUB CHG 4% DYNA-HEX 4OZ (MISCELLANEOUS) ×4 IMPLANT
SEAL UNIV 5-12 XI (MISCELLANEOUS) ×8 IMPLANT
SET TRI-LUMEN FLTR TB AIRSEAL (TUBING) ×2 IMPLANT
SPIKE FLUID TRANSFER (MISCELLANEOUS) ×2 IMPLANT
SPONGE T-LAP 18X18 ~~LOC~~+RFID (SPONGE) IMPLANT
SURGIFLO W/THROMBIN 8M KIT (HEMOSTASIS) IMPLANT
SUT MNCRL AB 4-0 PS2 18 (SUTURE) IMPLANT
SUT PDS AB 1 TP1 54 (SUTURE) IMPLANT
SUT VIC AB 0 CT1 27XBRD ANTBC (SUTURE) IMPLANT
SUT VIC AB 2-0 CT1 TAPERPNT 27 (SUTURE) IMPLANT
SUT VIC AB 4-0 PS2 18 (SUTURE) ×4 IMPLANT
SUT VICRYL 0 27 CT2 27 ABS (SUTURE) ×2 IMPLANT
SUT VICRYL 0 UR6 27IN ABS (SUTURE) IMPLANT
SYR 10ML LL (SYRINGE) IMPLANT
SYSTEM BAG RETRIEVAL 10MM (BASKET) IMPLANT
SYSTEM WOUND ALEXIS 18CM MED (MISCELLANEOUS) IMPLANT
TIP ENDOSCOPIC SURGICEL (TIP) IMPLANT
TRAP SPECIMEN MUCUS 40CC (MISCELLANEOUS) IMPLANT
TRAY FOLEY MTR SLVR 16FR STAT (SET/KITS/TRAYS/PACK) ×2 IMPLANT
TROCAR PORT AIRSEAL 5X120 (TROCAR) IMPLANT
TROCAR XCEL NON-BLD 5MMX100MML (ENDOMECHANICALS) ×2 IMPLANT
UNDERPAD 30X36 HEAVY ABSORB (UNDERPADS AND DIAPERS) ×4 IMPLANT
WATER STERILE IRR 1000ML POUR (IV SOLUTION) ×2 IMPLANT
YANKAUER SUCT BULB TIP 10FT TU (MISCELLANEOUS) IMPLANT

## 2023-10-25 NOTE — H&P (Signed)
 Brief Pre-operative History & Physical  Patient name: Catherine Moon CSN: [id number] MRN: [id number] Admit Date: 10/25/2023 Date of Surgery: 10/25/2023 Performing Service: Gynecology   Code Status: Prior    Assessment & Plan    Catherine Moon is a 63 y.o. female with BILATERAL OVARIAN CYSTS, who presents for: Procedure(s) (LRB): SALPINGO-OOPHORECTOMY, BILATERAL, ROBOT-ASSISTED (Bilateral).   Consent obtained in office is accurate. Risks, benefits, and alternatives to surgery were reviewed, and all questions were answered.  Proceed to the OR as planned.     History of Present Illness:  Catherine Moon is a 63 y.o. female with BILATERAL OVARIAN CYSTS. She was recently seen in clinic, where a detailed HPI can be found. She was noted to benefit from: Procedure(s) (LRB): SALPINGO-OOPHORECTOMY, BILATERAL, ROBOT-ASSISTED (Bilateral).   Medical History Past Medical History:  Diagnosis Date   (HFpEF) heart failure with preserved ejection fraction (HCC)    Anticoagulated    plavix  and asa 81mg   ---- managed by cardiology   Aortic stenosis due to bicuspid aortic valve    per intraoperative TEE 11-30-2023  aortic bicuspid valve w/ mild stenosis,  area 1.46   Clotting disorder 09/26/22   Postmenopausal bl   Coronary artery disease 11/2022   cardiologist--- dr verlin;   NSTEMI 11-26-2022  cath w/ severe 2 vessel CAD ;   11-30-2022  s/p CABG x2   CTS (carpal tunnel syndrome)    Diverticulosis of colon    GERD (gastroesophageal reflux disease)    Heart murmur    had ECHO 01-2021   Hemorrhoids    interenal and external   History of adenomatous polyp of colon    History of non-ST elevation myocardial infarction (NSTEMI)    HTN (hypertension)    Hyperlipidemia, mixed    IDA (iron deficiency anemia)    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   endocrinologist--- dr trixie;   dx 1997 DM2;  started insulin  in 2010   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)    followed gy dr abigail;   both eyes   Myocardial infarction (HCC)    PMB (postmenopausal bleeding)    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   LIMA--LAD;   RSVG--PLV  by dr lightfoot   Seasonal allergies    Thickened endometrium    Warts, genital    Wears glasses    Surgical History Past Surgical History:  Procedure Laterality Date   COLONOSCOPY WITH PROPOFOL   02/17/2015   dr glo   CORONARY ARTERY BYPASS GRAFT N/A 11/30/2022   Procedure: CORONARY ARTERY BYPASS GRAFTING X 2 , USING LEFT INTERNAL MAMMARY ARTERY AND ENDOSCOPICALLY HARVESTED RIGHT SAPHENOUS VEIN GRAFT;  Surgeon: Shyrl Linnie KIDD, MD;  Location: [facility] OR;  Service: Open Heart Surgery;  Laterality: N/A;   CYST EXCISION Right 03/26/2021   Procedure: RIGHT AXILLARY SEBACEOUS CYST EXCISION;  Surgeon: Ebbie Cough, MD;  Location: [facility];  Service: General;  Laterality: Right;  LOCAL   DILATATION & CURETTAGE/HYSTEROSCOPY WITH MYOSURE N/A 03/17/2023   Procedure: DILATATION & CURETTAGE/HYSTEROSCOPY WITH MYOSURE;  Surgeon: D'Iorio, Hadassah LABOR, MD;  Location: [facility] OR;  Service: Gynecology;  Laterality: N/A;  [facility]   DILATION AND CURETTAGE OF UTERUS  1977   LEFT HEART CATH AND CORONARY ANGIOGRAPHY N/A 11/26/2022   Procedure: LEFT HEART CATH AND CORONARY ANGIOGRAPHY;  Surgeon: Darron Deatrice LABOR, MD;  Location: [facility] INVASIVE [facility];  Service: Cardiovascular;  Laterality: N/A;   TEE WITHOUT CARDIOVERSION N/A  11/30/2022   Procedure: TRANSESOPHAGEAL ECHOCARDIOGRAM (TEE);  Surgeon: Shyrl Linnie KIDD, MD;  Location: [facility];  Service: Open Heart Surgery;  Laterality: N/A;   WISDOM TOOTH EXTRACTION     Allergies Patient has no known allergies.  Medications   No current facility-administered medications for this encounter.    Vital Signs BP (!) 152/65   Pulse 80   Temp 97.9 F (36.6 C) (Oral)   Resp 16   SpO2 96%  Facility age limit for  growth %iles is 20 years. Facility age limit for growth %iles is 20 years..   Physical Exam General: Well developed, appears stated age, in no acute distress  Mental status: Alert and oriented x3 Cardiovascular: Normal Pulmonary: Symmetric chest rise, unlabored breathing Relevant System for Surgery: Surgical site examination deferred to the OR   Labs and Studies: Lab Results  Component Value Date   WBC 12.1 (H) 10/20/2023   HGB 13.9 10/20/2023   HCT 44.1 10/20/2023   PLT 362 10/20/2023    Lab Results  Component Value Date   INR 1.4 (H) 11/30/2022   APTT 28 11/30/2022   \

## 2023-10-25 NOTE — Transfer of Care (Signed)
 Immediate Anesthesia Transfer of Care Note  Patient: Catherine Moon  Procedure(s) Performed: SALPINGO-OOPHORECTOMY, BILATERAL, ROBOT-ASSISTED (Bilateral)  Patient Location: PACU  Anesthesia Type:General  Level of Consciousness: awake, alert , and oriented  Airway & Oxygen Therapy: Patient Spontanous Breathing and Patient connected to face mask oxygen  Post-op Assessment: Report given to RN and Post -op Vital signs reviewed and stable  Post vital signs: Reviewed and stable  Last Vitals:  Vitals Value Taken Time  BP 156/73 10/25/23 14:06  Temp    Pulse 75 10/25/23 14:08  Resp 19 10/25/23 14:08  SpO2 94 % 10/25/23 14:08  Vitals shown include unfiled device data.  Last Pain:  Vitals:   10/25/23 0849  TempSrc:   PainSc: 0-No pain         Complications: No notable events documented.

## 2023-10-25 NOTE — Anesthesia Postprocedure Evaluation (Signed)
 Anesthesia Post Note  Patient: Catherine Moon  Procedure(s) Performed: SALPINGO-OOPHORECTOMY, BILATERAL, ROBOT-ASSISTED (Bilateral)     Patient location during evaluation: PACU Anesthesia Type: General Level of consciousness: awake and alert Pain management: pain level controlled Vital Signs Assessment: post-procedure vital signs reviewed and stable Respiratory status: spontaneous breathing, nonlabored ventilation and respiratory function stable Cardiovascular status: blood pressure returned to baseline and stable Postop Assessment: no apparent nausea or vomiting Anesthetic complications: no   No notable events documented.  Last Vitals:  Vitals:   10/25/23 1429 10/25/23 1521  BP:  (!) 162/72  Pulse: 76 76  Resp: 19 15  Temp:  36.5 C  SpO2: 91% 92%    Last Pain:  Vitals:   10/25/23 0849  TempSrc:   PainSc: 0-No pain                 Butler Levander Pinal

## 2023-10-25 NOTE — Anesthesia Procedure Notes (Signed)
 Procedure Name: Intubation Date/Time: 10/25/2023 10:23 AM  Performed by: Buster Catheryn SAUNDERS, CRNAPre-anesthesia Checklist: Patient identified, Emergency Drugs available, Suction available and Patient being monitored Patient Re-evaluated:Patient Re-evaluated prior to induction Oxygen Delivery Method: Circle system utilized Preoxygenation: Pre-oxygenation with 100% oxygen Induction Type: IV induction Ventilation: Mask ventilation without difficulty Laryngoscope Size: Miller and 2 Grade View: Grade II Tube type: Oral Tube size: 7.0 mm Number of attempts: 1 Airway Equipment and Method: Stylet and Oral airway Placement Confirmation: ETT inserted through vocal cords under direct vision, positive ETCO2 and breath sounds checked- equal and bilateral Secured at: 22 cm Tube secured with: Tape Dental Injury: Teeth and Oropharynx as per pre-operative assessment

## 2023-10-25 NOTE — Op Note (Signed)
 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 Obesity made retroperitoneal visualization limited, increasing the complexity of the case and necessitating additional instrumentation for retraction and to create safe exposure. Additional adhesions requiring meticulous dissection. Obesity and adhesions related complexity increased the duration of the procedure by 45 minutes.)   Surgeon: Hoy Masters, MD  Assistants: Olam Mill, MD and (an MD assistant was necessary for tissue manipulation, management of robotic instrumentation, retraction and positioning due to the complexity of the case and hospital policies)  Anesthesia: General  Estimated Blood Loss: 25 mL    Fluids: 900 ml, crystalloid  Urine Output: 200 ml, clear yellow  Findings: On entry to abdomen, normal upper abdominal survey with smooth diaphragm, liver, stomach and normal appearing omentum and bowel.  Increased visceral and retroperitoneal adiposity.  Bilateral enlarged ovaries with multicystic appearance and excrescences.  Bilateral ovaries adherent to the ovarian process and partially retroperitonealized requiring meticulous extensive adhesiolysis to mobilize the ovaries off of the bilateral pelvic sidewalls and the posterior cul-de-sac.  Incidental rupture of at least one cystic area on each ovary with thick yellow fluid drainage.  IOFS c/w benign ovary.  Specimens:  ID Type Source Tests Collected by Time Destination  1 : Left Tube and Ovary Tissue PATH Gyn tumor resection SURGICAL PATHOLOGY Masters Hoy, MD 10/25/2023  1240   2 : Right Tube and Ovary Tissue PATH Gyn tumor resection SURGICAL PATHOLOGY Masters Hoy, MD 10/25/2023 1238   A : Pelvic Washing Body Fluid PATH Cytology Pelvic Washing CYTOLOGY - NON PAP Masters Hoy, MD 10/25/2023 1058     Complications:  None  Indications for Procedure: Catherine Moon is a 63 y.o. woman bilateral enlarged ovaries with imaging suspicious for ovarian hyperthecosis and elevated serum testosterone .  Prior to the procedure, all risks, benefits, and alternatives were discussed and informed surgical consent was signed.  Procedure: Patient was taken to the operating room where general anesthesia was achieved.  She was positioned in dorsal lithotomy and prepped and draped.  A foley catheter was inserted into the bladder.  A Hulka manipulator was secured in the cervix.  A 12 mm incision was made in the left upper quadrant near Palmer's point.  The abdomen was entered with a 5 mm OptiView trocar under direct visualization.  The abdomen was insufflated, the patient placed in steep Trendelenburg, and additional trocars were placed as follows: an 8mm robotic trocar superior to the umbilicus, one 8 mm robotic trocar in the right abdomen, and one 8 mm robotic trocar in the left abdomen.  The left upper quadrant trocar was removed and replaced with a 12 mm airseal trocar.  All trocars were placed under direct visualization.  The bowels were moved into the upper abdomen.  The DaVinci robotic surgical system was brought to the patient's bedside and docked.  Pelvic washings were obtained.  The peritoneum overlying the right pelvic sidewall was incised and the the right retroperitoneum entered.   The right ureter was identified.  Given the visceral and retroperitoneal adiposity additional retraction was required increasing complexity and identification of the ureter.  Once the ureter was clearly identified, the right infundibulopelvic ligament was isolated, cauterized, and  transected.  The broad ligament was incised to the uterine cornu.  The ovary was noted to be densely adherent to the ovarian fossa and partially retroperitonealized.  Using combination of blunt and sharp dissection with short burst electrocautery the ovary was serially dissected off of the posterior cul-de-sac pelvic sidewall and away from the retroperitoneal structures. The utero-ovarian ligament and the proximal fallopian tube were isolated, cauterized, and transected.  The same procedure was performed on the contralateral side with similar extensive adhesiolysis to mobilize the ovary off of the pelvic sidewall.  Both specimens were placed into separate Endo-Catch bags and stored in upper abdomen for later retrieval.  The pelvis was irrigated and all operative sites were found to be hemostatic.  Surgicel powder was placed along the bilateral ovarian fossa where the adhesiolysis was performed with hemostasis achieved.  All instruments were removed and the robot was taken from the patient's bedside.  The Endo Catch bag sutures were brought through the supraumbilical trocar.  The fascia at the 12 mm incision was closed with 0 Vicryl with the assistance of a PMI device.   The supraumbilical trocar was then removed.  The incision was extended to an approximately 3 cm incision.  The subcutaneous tissue was divided bluntly.  The fascial incision was extended with scissors.  The first Endo Catch bag was brought through the incision and the ovary was gently morcellated in the bag and removed and sent for pathology.  The second Endo Catch bag was then brought out of the incision.  The left ovary was gently morcellated in the bag and the specimen in the bag was removed.  The abdomen was desufflated and all ports were removed after frozen pathology returned benign. The fascia at the midline incision was grasped and two figure of eight stitches were placed with 0 vicryl to close the fascial incision. The skin at all  incisions was closed with 4-0 Vicryl to reapproximate the subcutaneous tissue and 4-0 monocryl in a subcuticular fashion followed by surgical glue.  Patient tolerated the procedure well. Sponge, lap, and instrument counts were correct.  No perioperative antibiotic prophylaxis was indicated for this procedure.  She was extubated and taken to the PACU in stable condition.  Hoy Masters, MD Gynecologic Oncology

## 2023-10-26 ENCOUNTER — Telehealth: Payer: Self-pay | Admitting: *Deleted

## 2023-10-26 ENCOUNTER — Encounter (HOSPITAL_COMMUNITY): Payer: Self-pay | Admitting: Psychiatry

## 2023-10-26 LAB — SURGICAL PATHOLOGY

## 2023-10-26 NOTE — Telephone Encounter (Signed)
 Attempted to reach patient for post op call. Left voicemail requesting call back to [phone].

## 2023-10-26 NOTE — Telephone Encounter (Signed)
 Spoke with Catherine Moon this afternoon.  She states she is eating, drinking and urinating well. She has not had a BM yet but is passing gas. She is taking senokot as prescribed and encouraged her to drink plenty of water. She denies fever or chills. Incisions are dry and intact. She rates her pain 5/10. Her pain is controlled with ibuprofen and tylenol .    Instructed to call office with any fever, chills, purulent drainage, uncontrolled pain or any other questions or concerns. Patient verbalizes understanding.   Pt aware of post op appointments as well as the office number [phone] and after hours number [phone] to call if she has any questions or concerns

## 2023-10-26 NOTE — Telephone Encounter (Signed)
 2nd attempt to reach patient for post op call. Left voicemail requesting call back.

## 2023-10-28 LAB — CYTOLOGY - NON PAP

## 2023-10-31 ENCOUNTER — Ambulatory Visit: Payer: Self-pay | Admitting: Psychiatry

## 2023-11-07 ENCOUNTER — Inpatient Hospital Stay: Attending: Psychiatry | Admitting: Psychiatry

## 2023-11-07 ENCOUNTER — Encounter: Payer: Self-pay | Admitting: Psychiatry

## 2023-11-07 VITALS — BP 142/76 | HR 89 | Temp 97.7°F | Resp 19 | Wt 232.0 lb

## 2023-11-07 DIAGNOSIS — N8500 Endometrial hyperplasia, unspecified: Secondary | ICD-10-CM

## 2023-11-07 DIAGNOSIS — E288 Other ovarian dysfunction: Secondary | ICD-10-CM

## 2023-11-07 NOTE — Patient Instructions (Signed)
 It was a pleasure to see you in clinic today. - Healing well from surgery - Okay to resume normal activities. - Okay to resume routine annual care.  Thank you very much for allowing me to provide care for you today.  I appreciate your confidence in choosing our Gynecologic Oncology team at [facility].  If you have any questions about your visit today please call our office or send us  a MyChart message and we will get back to you as soon as possible.

## 2023-11-07 NOTE — Progress Notes (Signed)
 Gynecologic Oncology Return Clinic Visit  Date of Service: 11/07/2023 Referring Provider: Hadassah Springer, MD   Assessment & Plan: Catherine Moon is a 63 y.o. woman with enlarged bilateral ovaries who is s/p RA-BSO on 10/25/22.  Postop: - Pt recovering well from surgery and healing appropriately postoperatively - Intraoperative findings and pathology results reviewed. - Ongoing postoperative expectations and precautions reviewed. Restrictions lifted. - Okay to return to routine care.    RTC prn.  Hoy Masters, MD Gynecologic Oncology    ----------------------- Reason for Visit: Postop    Interval History: Pt reports that she is recovering well from surgery. She is not needing anything anymore for pain. She is eating and drinking well. She is voiding without issue and back to her baseline for bowel movements.    Past Medical/Surgical History: Past Medical History:  Diagnosis Date   (HFpEF) heart failure with preserved ejection fraction (HCC)    Anticoagulated    plavix  and asa 81mg   ---- managed by cardiology   Aortic stenosis due to bicuspid aortic valve    per intraoperative TEE 11-30-2023  aortic bicuspid valve w/ mild stenosis,  area 1.46   Clotting disorder 09/26/22   Postmenopausal bl   Coronary artery disease 11/2022   cardiologist--- dr verlin;   NSTEMI 11-26-2022  cath w/ severe 2 vessel CAD ;   11-30-2022  s/p CABG x2   CTS (carpal tunnel syndrome)    Diverticulosis of colon    GERD (gastroesophageal reflux disease)    Heart murmur    had ECHO 01-2021   Hemorrhoids    interenal and external   History of adenomatous polyp of colon    History of non-ST elevation myocardial infarction (NSTEMI)    HTN (hypertension)    Hyperlipidemia, mixed    IDA (iron deficiency anemia)    Insulin  dependent type 2 diabetes mellitus [facility] 1997   endocrinologist--- dr trixie;   dx 1997 DM2;  started insulin  in 2010   Mild nonproliferative diabetic retinopathy  without macular edema associated with type 2 diabetes mellitus (HCC)    followed gy dr abigail;   both eyes   Myocardial infarction (HCC)    PMB (postmenopausal bleeding)    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   LIMA--LAD;   RSVG--PLV  by dr lightfoot   Seasonal allergies    Thickened endometrium    Warts, genital    Wears glasses     Past Surgical History:  Procedure Laterality Date   COLONOSCOPY WITH PROPOFOL   02/17/2015   dr glo   CORONARY ARTERY BYPASS GRAFT N/A 11/30/2022   Procedure: CORONARY ARTERY BYPASS GRAFTING X 2 , USING LEFT INTERNAL MAMMARY ARTERY AND ENDOSCOPICALLY HARVESTED RIGHT SAPHENOUS VEIN GRAFT;  Surgeon: Shyrl Linnie KIDD, MD;  Location: [facility] OR;  Service: Open Heart Surgery;  Laterality: N/A;   CYST EXCISION Right 03/26/2021   Procedure: RIGHT AXILLARY SEBACEOUS CYST EXCISION;  Surgeon: Ebbie Cough, MD;  Location: [facility];  Service: General;  Laterality: Right;  LOCAL   DILATATION & CURETTAGE/HYSTEROSCOPY WITH MYOSURE N/A 03/17/2023   Procedure: DILATATION & CURETTAGE/HYSTEROSCOPY WITH MYOSURE;  Surgeon: D'Iorio, Hadassah LABOR, MD;  Location: [facility] OR;  Service: Gynecology;  Laterality: N/A;  [facility]   DILATION AND CURETTAGE OF UTERUS  1977   LEFT HEART CATH AND CORONARY ANGIOGRAPHY N/A 11/26/2022   Procedure: LEFT HEART CATH AND CORONARY ANGIOGRAPHY;  Surgeon: Darron Deatrice LABOR, MD;  Location: [facility];  Service: Cardiovascular;  Laterality: N/A;   ROBOTIC ASSISTED  BILATERAL SALPINGO OOPHERECTOMY Bilateral 10/25/2023   Procedure: SALPINGO-OOPHORECTOMY, BILATERAL, ROBOT-ASSISTED;  Surgeon: Eldonna Mays, MD;  Location: [facility];  Service: Gynecology;  Laterality: Bilateral;   TEE WITHOUT CARDIOVERSION N/A 11/30/2022   Procedure: TRANSESOPHAGEAL ECHOCARDIOGRAM (TEE);  Surgeon: Shyrl Linnie KIDD, MD;  Location: [facility];  Service: Open Heart Surgery;  Laterality: N/A;   WISDOM TOOTH EXTRACTION      Family History  Problem Relation Age of Onset    Breast cancer Mother    Heart disease Mother    Cancer Mother    Diabetes Mother    Coronary artery disease Father        starting in his 53s. A bypass and mitral infarction   Heart disease Father        Coronary Disease, bypass and mitral infarction   Hypertension Sister    Pancreatic cancer Sister    Diabetes Brother    Stroke Brother    Hypertension Brother    Lung cancer Maternal Grandfather        Forensic scientist   Colon cancer Paternal Grandmother    Prostate cancer Neg Hx    Ovarian cancer Neg Hx    Endometrial cancer Neg Hx     Social History   Socioeconomic History   Marital status: Married    Spouse name: Not on file   Number of children: Not on file   Years of education: Not on file   Highest education level: Associate degree: academic program  Occupational History   Not on file  Tobacco Use   Smoking status: Former    Current packs/day: 0.00    Average packs/day: 1 pack/day for 26.0 years (26.0 ttl pk-yrs)    Types: Cigarettes    Start date: 08/12/1981    Quit date: 08/13/2007    Years since quitting: 16.2   Smokeless tobacco: Never   Tobacco comments:     She smoked one pack per day for 26 years but quit in 2009.  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  She smoked one pack per day for 26 years but quit in 2009.         Social Drivers of Corporate Investment Banker Strain: Low Risk  (06/16/2023)   Overall Financial Resource Strain (CARDIA)    Difficulty of Paying Living Expenses: Not hard at all  Food Insecurity: No Food Insecurity (06/16/2023)   Hunger Vital Sign    Worried About Running Out of Food in the Last Year: Never true    Ran Out of Food in the Last Year: Never true   Transportation Needs: No Transportation Needs (06/16/2023)   PRAPARE - Administrator, Civil Service (Medical): No    Lack of Transportation (Non-Medical): No  Physical Activity: Insufficiently Active (06/16/2023)   Exercise Vital Sign    Days of Exercise per Week: 2 days    Minutes of Exercise per Session: 10 min  Stress: Stress Concern Present (06/16/2023)   [employer] of Occupational Health - Occupational Stress Questionnaire    Feeling of Stress: To some extent  Social Connections: Moderately Isolated (06/16/2023)   Social Connection and Isolation Panel    Frequency of Communication with Friends  and Family: Three times a week    Frequency of Social Gatherings with Friends and Family: Never    Attends Religious Services: Never    Database Administrator or Organizations: No    Attends Engineer, Structural: Not on file    Marital Status: Married    Current Medications:  Current Outpatient Medications:    acetaminophen  (TYLENOL ) 500 MG tablet, Take 1,000 mg by mouth every 6 (six) hours as needed for mild pain, moderate pain, fever or headache., Disp: , Rfl:    amLODipine  (NORVASC ) 5 MG tablet, Take 1 tablet (5 mg total) by mouth daily., Disp: 90 tablet, Rfl: 3   aspirin  81 MG EC tablet, Take 81 mg by mouth daily after lunch., Disp: , Rfl:    atorvastatin  (LIPITOR ) 80 MG tablet, TAKE 1 TABLET (80 MG TOTAL) BY MOUTH DAILY. DUE FOR PHYSICAL., Disp: 90 tablet, Rfl: 1   Black Elderberry 1000 MG CAPS, Take 2,000 mg by mouth daily after lunch., Disp: , Rfl:    calcium  carbonate (OSCAL) 1500 (600 Ca) MG TABS tablet, Take 1,200 mg of elemental calcium  by mouth daily after lunch., Disp: , Rfl:    cholecalciferol (VITAMIN D3) 25 MCG (1000 UNIT) tablet, Take 1,000 Units by mouth daily after lunch., Disp: , Rfl:    clopidogrel  (PLAVIX ) 75 MG tablet, Take 1 tablet (75 mg total) by mouth daily., Disp: 90 tablet, Rfl: 3   Continuous Glucose Sensor (DEXCOM G7 SENSOR) MISC, 3  each by Does not apply route every 30 (thirty) days. Apply 1 sensor every 10 days, Disp: 9 each, Rfl: 4   Continuous Glucose Sensor (FREESTYLE LIBRE 2 PLUS SENSOR) MISC, Inject 1 each into the skin as directed. Change sensor every 15 days, Disp: 6 each, Rfl: 3   Cranberry 4200 MG CAPS, Take 8,400 mg by mouth daily after lunch., Disp: , Rfl:    docusate sodium  (COLACE) 250 MG capsule, Take 250 mg by mouth daily., Disp: , Rfl:    ezetimibe  (ZETIA ) 10 MG tablet, TAKE 1 TABLET BY MOUTH EVERY DAY, Disp: 90 tablet, Rfl: 3   famotidine (PEPCID) 20 MG tablet, Take 20 mg by mouth daily., Disp: , Rfl:    ferrous sulfate  325 (65 FE) MG tablet, Take 325 mg by mouth daily after lunch., Disp: , Rfl:    fexofenadine (ALLEGRA) 180 MG tablet, Take 180 mg by mouth daily after lunch., Disp: , Rfl:    Ginger, Zingiber officinalis, (GINGER ROOT) 550 MG CAPS, Take 550 mg by mouth daily after lunch., Disp: , Rfl:    Green Tea 315 MG CAPS, Take 315 mg by mouth daily after lunch., Disp: , Rfl:    ibuprofen (ADVIL) 200 MG tablet, Take 400 mg by mouth every 6 (six) hours as needed for moderate pain (pain score 4-6) or mild pain (pain score 1-3)., Disp: , Rfl:    Insulin  Pen Needle 32G X 4 MM MISC, Use 4x a day, Disp: 400 each, Rfl: 3   insulin  regular human CONCENTRATED (HUMULIN  R U-500 KWIKPEN) 500 UNIT/ML KwikPen, Inject under skin up to 200 units insulin  a day as advised (Patient taking differently: Inject 60-80 Units into the skin See admin instructions. Inject 80 units in the morning, 60 units at lunch and dinner), Disp: 36 mL, Rfl: 2   loratadine  (CLARITIN ) 10 MG tablet, Take 10 mg by mouth daily., Disp: , Rfl:    Magnesium  Oxide 250 MG TABS, Take 250 mg by mouth daily after lunch., Disp: , Rfl:  metoprolol  tartrate (LOPRESSOR ) 50 MG tablet, TAKE 1 TABLET BY MOUTH TWICE A DAY, Disp: 180 tablet, Rfl: 3   Multiple Vitamin (MULTIVITAMIN WITH MINERALS) TABS tablet, Take 1 tablet by mouth daily after lunch. Alive, Disp: ,  Rfl:    Multiple Vitamins-Minerals (HAIR SKIN & NAILS) TABS, Take 3 tablets by mouth daily., Disp: , Rfl:    Multiple Vitamins-Minerals (PRESERVISION AREDS PO), Take 1 capsule by mouth daily after lunch. Eye health, Disp: , Rfl:    Potassium 99 MG TABS, Take 1 tablet (99 mg total) by mouth daily., Disp: , Rfl:    Probiotic Product (PROBIOTIC DAILY PO), Take 1 capsule by mouth daily with lunch., Disp: , Rfl:    senna-docusate (SENOKOT-S) 8.6-50 MG tablet, Take 2 tablets by mouth at bedtime. For AFTER surgery, do not take if having diarrhea, Disp: 30 tablet, Rfl: 0   sodium chloride  (OCEAN) 0.65 % SOLN nasal spray, Place 1 spray into both nostrils daily as needed for congestion., Disp: , Rfl:    tirzepatide  (MOUNJARO ) 12.5 MG/0.5ML Pen, Inject 12.5 mg into the skin once a week., Disp: 6 mL, Rfl: 3   traMADol  (ULTRAM ) 50 MG tablet, Take 1 tablet (50 mg total) by mouth every 6 (six) hours as needed for moderate pain (pain score 4-6). For AFTER surgery only, do not take and drive (Patient not taking: Reported on 11/02/2023), Disp: 10 tablet, Rfl: 0   zinc gluconate 50 MG tablet, Take 50 mg by mouth daily after lunch., Disp: , Rfl:   Review of Symptoms: Complete 10-system review is positive for: Urinary frequency, diarrhea, fatigue, dizziness  Physical Exam: BP (!) 157/66 (BP Location: Left Arm, Patient Position: Sitting)   Pulse 89   Temp 97.7 F (36.5 C) (Oral)   Resp 19   Wt 232 lb (105.2 kg)   SpO2 97%   BMI 37.45 kg/m  General: Alert, oriented, no acute distress. HEENT: Normocephalic, atraumatic. Neck symmetric without masses. Sclera anicteric.  Chest: Normal work of breathing.  Abdomen: Soft, nontender.  Normoactive bowel sounds.  No masses appreciated.  Well-healing incisions.  Glue removed.  Right lateral incision with superficial rawness from cautery burn.  Dressed with 2 x 2 and tape. Extremities: Grossly normal range of motion.  Warm, well perfused.  No edema bilaterally. Skin: No  rashes or lesions noted.   Laboratory & Radiologic Studies: Surgical pathology (10/25/23): A. FALLOPIAN TUBE, OVARY, RIGHT, SALPINGO OOPHORECTOMY:  Right ovary:      Benign serous cyst adenofibroma.      Negative for borderline change and malignancy.  Right fallopian tube:      Benign paratubal cyst.      Negative for endometriosis and malignancy.   B. FALLOPIAN TUBE, OVARY, LEFT, SALPINGO OOPHORECTOMY:  Left ovary:      Benign serous cyst adenofibroma.      Negative for borderline change and malignancy.  Left fallopian tube:      Unremarkable.      Negative for endometriosis and malignancy.   Cytology (10/25/23): FINAL MICROSCOPIC DIAGNOSIS:  - No malignant cells identified

## 2023-11-29 ENCOUNTER — Other Ambulatory Visit: Payer: Self-pay | Admitting: Internal Medicine

## 2023-11-29 DIAGNOSIS — E113291 Type 2 diabetes mellitus with mild nonproliferative diabetic retinopathy without macular edema, right eye: Secondary | ICD-10-CM

## 2023-11-30 ENCOUNTER — Other Ambulatory Visit: Payer: Self-pay

## 2023-11-30 ENCOUNTER — Other Ambulatory Visit (HOSPITAL_COMMUNITY): Payer: Self-pay

## 2023-11-30 MED ORDER — HUMULIN R U-500 KWIKPEN 500 UNIT/ML ~~LOC~~ SOPN
PEN_INJECTOR | SUBCUTANEOUS | 2 refills | Status: AC
  Filled 2023-11-30: qty 36, 90d supply, fill #0

## 2023-12-16 ENCOUNTER — Ambulatory Visit: Admitting: Internal Medicine

## 2023-12-26 ENCOUNTER — Encounter: Payer: Self-pay | Admitting: Internal Medicine

## 2023-12-26 ENCOUNTER — Ambulatory Visit: Admitting: Internal Medicine

## 2023-12-26 ENCOUNTER — Other Ambulatory Visit

## 2023-12-26 VITALS — BP 122/70 | HR 94 | Ht 66.0 in | Wt 236.0 lb

## 2023-12-26 DIAGNOSIS — Z794 Long term (current) use of insulin: Secondary | ICD-10-CM

## 2023-12-26 DIAGNOSIS — E113291 Type 2 diabetes mellitus with mild nonproliferative diabetic retinopathy without macular edema, right eye: Secondary | ICD-10-CM

## 2023-12-26 DIAGNOSIS — E1169 Type 2 diabetes mellitus with other specified complication: Secondary | ICD-10-CM

## 2023-12-26 DIAGNOSIS — E785 Hyperlipidemia, unspecified: Secondary | ICD-10-CM | POA: Diagnosis not present

## 2023-12-26 DIAGNOSIS — Z7985 Long-term (current) use of injectable non-insulin antidiabetic drugs: Secondary | ICD-10-CM

## 2023-12-26 LAB — POCT GLYCOSYLATED HEMOGLOBIN (HGB A1C): Hemoglobin A1C: 7.9 % — AB (ref 4.0–5.6)

## 2023-12-26 MED ORDER — FREESTYLE LIBRE 3 PLUS SENSOR MISC: 1.0000 | 3 refills | Status: AC

## 2023-12-26 NOTE — Progress Notes (Signed)
 Patient ID: Catherine Moon, female   DOB: 1960-09-23, 63 y.o.   MRN: [id number]  HPI: Catherine Moon is a 63 y.o.-year-old female, returning for follow-up for DM2, dx in 1997, insulin -dependent since 2010, uncontrolled, with complications (CAD - s/p NSTEMI, s/p CABG 11/2022, CHF, diabetic retinopathy).  She previously saw Dr. Kassie.  Last visit with me 4 months ago.  Interim history: No increased urination, blurry vision, nausea. She is on Pepcid for gas and reflux - feels better. Before last visit she was diagnosed with ovarian hyperthecosis.  She had bilateral salpingo-oophorectomy 10/25/2023-no malignancy was found.  Reviewed HbA1c: Lab Results  Component Value Date   HGBA1C 7.0 (H) 10/20/2023   HGBA1C 7.8 (A) 08/16/2023   HGBA1C 5.9 (A) 01/28/2023   HGBA1C 7.5 (H) 11/26/2022   HGBA1C 8.8 09/14/2022   HGBA1C 8.9 (A) 05/04/2022   HGBA1C 9.6 (A) 12/18/2021   HGBA1C 10.0 (A) 09/14/2021   HGBA1C 8.6 (A) 05/18/2021   HGBA1C 9.8 (A) 02/11/2021  09/14/2022: HbA1c 8.8%  Previously on: - Toujeo  60 units at bedtime (2-3  am) >> 80 >> 90 units daily - Humalog  30-40 units 3x a day before meals - Ozempic  2 mg weekly >> reflux worse  Now on: - Mounjaro  7.5 >> 10 >> 12.5 mg weekly - U500: 100 units in am, 50 units before lunch and dinner >>  - 100 >> 80 units in am/mid-day - >> off >> 60 units before lunch - 80-90 >> 50-60 >> 60 units before dinner She was on Metformin >> GI distress.  She had a Dexcom before her expensive.  Pt checks her sugars 4x a day with her freestyle libre 2+ (pays for it out-of-pocket):  Prev.: - am: 165-228, 336 >> 72-210, 244 >> 95-277, 299, 341 - 2h after b'fast: n/c - before lunch: n/c >> 140-160 >> n/c >> 178-250, 325 - 2h after lunch: n/c - before dinner: 85, 166-284 >> 61-214, 229 >> 83-182, 248, 405 - 2h after dinner:up to 300 >> 318 >> n/c  >> 117, 231-283 - bedtime: n/c  >> 107-168, 242 >> n/c - nighttime: n/c >> 60-69, 85 >>  n/c Lowest sugar was 60s >> 83; she has hypoglycemia awareness at 70.  Highest sugar was 400 (icecream, no Humalog ) ...>> 242 >> 405.  Glucometer: One Touch ultra  - no CKD, last BUN/creatinine:  Lab Results  Component Value Date   BUN 14 10/20/2023   BUN 17 06/21/2023   CREATININE 0.85 10/20/2023   CREATININE 0.84 06/21/2023   Lab Results  Component Value Date   MICRALBCREAT 50 (H) 08/16/2023   MICRALBCREAT 2.1 11/18/2009   MICRALBCREAT 6.9 11/05/2005  On lisinopril  20 mg daily.  -+ Hyperlipidemia including hypertriglyceridemia; last set of lipids: Lab Results  Component Value Date   CHOL 102 11/26/2022   HDL 36 (L) 11/26/2022   LDLCALC 42 11/26/2022   LDLDIRECT 137.0 12/10/2020   TRIG 122 11/26/2022   CHOLHDL 2.8 11/26/2022  On Lipitor  80 mg daily.  - last eye exam was on  08/09/2023. + Mild NPDR reportedly. She has borderline glaucoma. Dr. Abigail.   - no numbness and tingling in her feet.  Last foot exam 08/16/2023.  On ASA 81.  She is staying up late and wakes up late.  She works from home. She developed chest pain in 11/2022.  She ended up having CABG 11/30/2022.  At our last visit she was in cardiac rehab and she was off from work.  ROS: + see HPI  Past Medical History:  Diagnosis Date   (HFpEF) heart failure with preserved ejection fraction (HCC)    Anticoagulated    plavix  and asa 81mg   ---- managed by cardiology   Aortic stenosis due to bicuspid aortic valve    per intraoperative TEE 11-30-2023  aortic bicuspid valve w/ mild stenosis,  area 1.46   Clotting disorder 09/26/22   Postmenopausal bl   Coronary artery disease 11/2022   cardiologist--- dr verlin;   NSTEMI 11-26-2022  cath w/ severe 2 vessel CAD ;   11-30-2022  s/p CABG x2   CTS (carpal tunnel syndrome)    Diverticulosis of colon    GERD (gastroesophageal reflux disease)    Heart murmur    had ECHO 01-2021   Hemorrhoids    interenal and external   History of adenomatous polyp of colon     History of non-ST elevation myocardial infarction (NSTEMI)    HTN (hypertension)    Hyperlipidemia, mixed    IDA (iron deficiency anemia)    Insulin  dependent type 2 diabetes mellitus [facility]) 1997   endocrinologist--- dr trixie;   dx 1997 DM2;  started insulin  in 2010   Mild nonproliferative diabetic retinopathy without macular edema associated with type 2 diabetes mellitus (HCC)    followed gy dr abigail;   both eyes   Myocardial infarction (HCC)    PMB (postmenopausal bleeding)    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   LIMA--LAD;   RSVG--PLV  by dr lightfoot   Seasonal allergies    Thickened endometrium    Warts, genital    Wears glasses    Past Surgical History:  Procedure Laterality Date   COLONOSCOPY WITH PROPOFOL   02/17/2015   dr glo   CORONARY ARTERY BYPASS GRAFT N/A 11/30/2022   Procedure: CORONARY ARTERY BYPASS GRAFTING X 2 , USING LEFT INTERNAL MAMMARY ARTERY AND ENDOSCOPICALLY HARVESTED RIGHT SAPHENOUS VEIN GRAFT;  Surgeon: Shyrl Linnie KIDD, MD;  Location: [facility] OR;  Service: Open Heart Surgery;  Laterality: N/A;   CYST EXCISION Right 03/26/2021   Procedure: RIGHT AXILLARY SEBACEOUS CYST EXCISION;  Surgeon: Ebbie Cough, MD;  Location: [facility];  Service: General;  Laterality: Right;  LOCAL   DILATATION & CURETTAGE/HYSTEROSCOPY WITH MYOSURE N/A 03/17/2023   Procedure: DILATATION & CURETTAGE/HYSTEROSCOPY WITH MYOSURE;  Surgeon: D'Iorio, Hadassah LABOR, MD;  Location: [facility] OR;  Service: Gynecology;  Laterality: N/A;  [facility]   DILATION AND CURETTAGE OF UTERUS  1977   LEFT HEART CATH AND CORONARY ANGIOGRAPHY N/A 11/26/2022   Procedure: LEFT HEART CATH AND CORONARY ANGIOGRAPHY;  Surgeon: Darron Deatrice LABOR, MD;  Location: [facility];  Service: Cardiovascular;  Laterality: N/A;   ROBOTIC ASSISTED BILATERAL SALPINGO OOPHERECTOMY Bilateral 10/25/2023   Procedure: SALPINGO-OOPHORECTOMY, BILATERAL, ROBOT-ASSISTED;  Surgeon: Eldonna Mays, MD;  Location: [facility];  Service:  Gynecology;  Laterality: Bilateral;   TEE WITHOUT CARDIOVERSION N/A 11/30/2022   Procedure: TRANSESOPHAGEAL ECHOCARDIOGRAM (TEE);  Surgeon: Shyrl Linnie KIDD, MD;  Location: [facility] OR;  Service: Open Heart Surgery;  Laterality: N/A;   WISDOM TOOTH EXTRACTION     Social History   Socioeconomic History   Marital status: Married    Spouse name: Not on file   Number of children: Not on file   Years of education: Not on file   Highest education level: Associate degree: academic program  Occupational History   Not on file  Tobacco Use   Smoking status: Former    Current packs/day: 0.00    Average packs/day: 1 pack/day  for 26.0 years (26.0 ttl pk-yrs)    Types: Cigarettes    Start date: 08/12/1981    Quit date: 08/13/2007    Years since quitting: 16.3   Smokeless tobacco: Never   Tobacco comments:     She smoked one pack per day for 26 years but quit in 2009.  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  She smoked one pack per day for 26 years but quit in 2009.         Social Drivers of Health   Tobacco Use: Medium Risk (11/07/2023)   Patient History    Smoking Tobacco Use: Former    Smokeless Tobacco Use: Never    Passive Exposure: Not on file  Financial Resource Strain: Low Risk (06/16/2023)   Overall Financial Resource Strain (CARDIA)    Difficulty of Paying Living Expenses: Not hard at all  Food Insecurity: No Food Insecurity (06/16/2023)   Epic    Worried About Programme Researcher, Broadcasting/film/video in the Last Year: Never true    Ran Out of Food in the Last Year: Never true  Transportation Needs: No Transportation Needs (06/16/2023)   Epic    Lack of Transportation (Medical): No    Lack of Transportation (Non-Medical): No  Physical  Activity: Insufficiently Active (06/16/2023)   Exercise Vital Sign    Days of Exercise per Week: 2 days    Minutes of Exercise per Session: 10 min  Stress: Stress Concern Present (06/16/2023)   [employer] of Occupational Health - Occupational Stress Questionnaire    Feeling of Stress: To some extent  Social Connections: Moderately Isolated (06/16/2023)   Social Connection and Isolation Panel    Frequency of Communication with Friends and Family: Three times a week    Frequency of Social Gatherings with Friends and Family: Never    Attends Religious Services: Never    Database Administrator or Organizations: No    Attends Engineer, Structural: Not on file    Marital Status: Married  Catering Manager Violence: Not At Risk (01/11/2023)   Humiliation, Afraid, Rape, and Kick questionnaire    Fear of Current or Ex-Partner: No    Emotionally Abused: No    Physically Abused: No    Sexually Abused: No  Depression (PHQ2-9): Low Risk (01/11/2023)   Depression (PHQ2-9)    PHQ-2 Score: 0  Alcohol Screen: Low Risk (06/16/2023)   Alcohol Screen    Last Alcohol Screening Score (AUDIT): 1  Housing: Low Risk (06/16/2023)   Epic    Unable to Pay for Housing in the Last Year: No    Number of Times Moved in the Last Year: 0    Homeless in the Last Year: No  Utilities: Not At Risk (01/11/2023)   AHC Utilities    Threatened with loss of utilities: No  Health Literacy: Adequate Health Literacy (01/11/2023)   B1300 Health Literacy    Frequency of need for help with medical instructions: Never   Current Outpatient Medications on File Prior to Visit  Medication Sig Dispense Refill   acetaminophen  (TYLENOL ) 500 MG tablet Take 1,000 mg by mouth every 6 (six) hours as needed for mild pain, moderate pain, fever or headache.  amLODipine  (NORVASC ) 5 MG tablet Take 1 tablet (5 mg total) by mouth daily. 90 tablet 3   aspirin  81 MG EC tablet Take 81 mg by mouth daily after lunch.     atorvastatin   (LIPITOR ) 80 MG tablet TAKE 1 TABLET (80 MG TOTAL) BY MOUTH DAILY. DUE FOR PHYSICAL. 90 tablet 1   Black Elderberry 1000 MG CAPS Take 2,000 mg by mouth daily after lunch.     calcium  carbonate (OSCAL) 1500 (600 Ca) MG TABS tablet Take 1,200 mg of elemental calcium  by mouth daily after lunch.     cholecalciferol (VITAMIN D3) 25 MCG (1000 UNIT) tablet Take 1,000 Units by mouth daily after lunch.     clopidogrel  (PLAVIX ) 75 MG tablet Take 1 tablet (75 mg total) by mouth daily. 90 tablet 3   Continuous Glucose Sensor (FREESTYLE LIBRE 2 PLUS SENSOR) MISC Inject 1 each into the skin as directed. Change sensor every 15 days 6 each 3   Cranberry 4200 MG CAPS Take 8,400 mg by mouth daily after lunch.     docusate sodium  (COLACE) 250 MG capsule Take 250 mg by mouth daily.     ezetimibe  (ZETIA ) 10 MG tablet TAKE 1 TABLET BY MOUTH EVERY DAY 90 tablet 3   famotidine (PEPCID) 20 MG tablet Take 20 mg by mouth daily.     ferrous sulfate  325 (65 FE) MG tablet Take 325 mg by mouth daily after lunch.     fexofenadine (ALLEGRA) 180 MG tablet Take 180 mg by mouth daily after lunch.     Ginger, Zingiber officinalis, (GINGER ROOT) 550 MG CAPS Take 550 mg by mouth daily after lunch.     Green Tea 315 MG CAPS Take 315 mg by mouth daily after lunch.     ibuprofen (ADVIL) 200 MG tablet Take 400 mg by mouth every 6 (six) hours as needed for moderate pain (pain score 4-6) or mild pain (pain score 1-3).     Insulin  Pen Needle 32G X 4 MM MISC Use 4x a day 400 each 3   insulin  regular human CONCENTRATED (HUMULIN  R U-500 KWIKPEN) 500 UNIT/ML KwikPen Inject under skin up to 200 units insulin  a day as advised 36 mL 2   loratadine  (CLARITIN ) 10 MG tablet Take 10 mg by mouth daily.     Magnesium  Oxide 250 MG TABS Take 250 mg by mouth daily after lunch.     metoprolol  tartrate (LOPRESSOR ) 50 MG tablet TAKE 1 TABLET BY MOUTH TWICE A DAY 180 tablet 3   Multiple Vitamin (MULTIVITAMIN WITH MINERALS) TABS tablet Take 1 tablet by mouth  daily after lunch. Alive     Multiple Vitamins-Minerals (HAIR SKIN & NAILS) TABS Take 3 tablets by mouth daily.     Multiple Vitamins-Minerals (PRESERVISION AREDS PO) Take 1 capsule by mouth daily after lunch. Eye health     Potassium 99 MG TABS Take 1 tablet (99 mg total) by mouth daily.     Probiotic Product (PROBIOTIC DAILY PO) Take 1 capsule by mouth daily with lunch.     senna-docusate (SENOKOT-S) 8.6-50 MG tablet Take 2 tablets by mouth at bedtime. For AFTER surgery, do not take if having diarrhea 30 tablet 0   sodium chloride  (OCEAN) 0.65 % SOLN nasal spray Place 1 spray into both nostrils daily as needed for congestion.     tirzepatide  (MOUNJARO ) 12.5 MG/0.5ML Pen Inject 12.5 mg into the skin once a week. 6 mL 3   zinc gluconate 50 MG tablet Take 50 mg by mouth  daily after lunch.     No current facility-administered medications on file prior to visit.   No Known Allergies Family History  Problem Relation Age of Onset   Breast cancer Mother    Heart disease Mother    Cancer Mother    Diabetes Mother    Coronary artery disease Father        starting in his 64s. A bypass and mitral infarction   Heart disease Father        Coronary Disease, bypass and mitral infarction   Hypertension Sister    Pancreatic cancer Sister    Diabetes Brother    Stroke Brother    Hypertension Brother    Lung cancer Maternal Grandfather        Forensic scientist   Colon cancer Paternal Grandmother    Prostate cancer Neg Hx    Ovarian cancer Neg Hx    Endometrial cancer Neg Hx    PE: BP 122/70   Pulse 94   Ht 5' 6 (1.676 m)   Wt 236 lb (107 kg)   SpO2 97%   BMI 38.09 kg/m  Wt Readings from Last 10 Encounters:  12/26/23 236 lb (107 kg)  11/07/23 232 lb (105.2 kg)  10/20/23 238 lb (108 kg)  09/07/23 236 lb 9.6 oz (107.3 kg)  08/16/23 230 lb 3.2 oz (104.4 kg)  08/08/23 230 lb (104.3 kg)  06/21/23 223 lb 6 oz (101.3 kg)  05/16/23 222 lb (100.7 kg)  04/25/23 218 lb 12.8 oz (99.2 kg)  03/17/23  215 lb (97.5 kg)   Constitutional: overweight, in NAD Eyes: no exophthalmos ENT: no masses palpated in neck, no cervical lymphadenopathy Cardiovascular: Tachycardia, RR, No MRG Respiratory: CTA B Musculoskeletal: no deformities Skin:no rashes Neurological: no tremor with outstretched hands  ASSESSMENT: 1. DM2, insulin -dependent, uncontrolled, with complications - CAD, s/p NSTEMI, s/p CABG 11/2022 - CHF - mild NPDR OD, without macular edema  2. HL  PLAN:  1. Patient with longstanding, uncontrolled, type 2 diabetes, very insulin  resistant, on U-500 insulin  and GLP-1/GIP receptor agonist with impressively improved blood sugar control earlier in the year, to an HbA1c of 5.9%.  At last visit, though, HbA1c was higher, at 7.8% so increased the dose of her Mounjaro  at that time.  Sugars were much worse, up to 400s.  She was off the Ewing sensor and I advised her to restart it.  She was also off Mounjaro  and finally able to refill it right before the appointment.  We discussed about the importance of starting to improve diet. - She had another HbA1c obtained 2 months ago which was lower, at 7.0%. CGM interpretation: -At today's visit, we reviewed her CGM downloads -she had a gap in using the device after he came off and just restarted last month.  However, we reviewed together the tracings from last month which- pattern continues now: It appears that 36% of values are in target range (goal >70%), while 63% are higher than 180 (goal <25%), and 1% are lower than 70 (goal <4%).  The calculated average blood sugar is 207.  The projected HbA1c for the next 3 months (GMI) is 8.3%. -Reviewing the CGM trends, sugars appear to be higher than expected from her HbA1c.  She does mention dietary indiscretions particularly now during the holidays (chocolate).  We discussed about reducing these.  There is a significant trend of blood sugars dropping overnight and increasing drastically when she wakes up around 6 in  the morning, after the first meal  of the day and then again after lunch.  Therefore, at today's visit we discussed about decreasing the dose of her insulin  before dinner and increasing the morning dose of insulin .  For now, we will continue the lunchtime dose the same.  Will also continue Mounjaro  at the same dose. - I suggested to:  Patient Instructions  Please continue: - Mounjaro  12.5 mg weekly  Change: - U500: - 100 units in am  - 60 units before lunch - 40 units before dinner  Start using the [employer].  Please return in 1.5 months.  - we checked her HbA1c: 7.9% (higher) - advised to check sugars at different times of the day - 4x a day, rotating check times - advised for yearly eye exams >> she is UTD -At last visit, an ACR was slightly elevated, at 50.  I checked with the patient and she mentioned that she was taken off lisinopril /HCTZ by cardiology.  Will repeat her ACR now and see if we need to add back lisinopril . - return to clinic in 1.5 months  2. HL - Latest lipid panel was reviewed from 11/2022: LDL and triglycerides at goal, slightly low HDL: Lab Results  Component Value Date   CHOL 102 11/26/2022   HDL 36 (L) 11/26/2022   LDLCALC 42 11/26/2022   LDLDIRECT 137.0 12/10/2020   TRIG 122 11/26/2022   CHOLHDL 2.8 11/26/2022  - She continues on Lipitor  80 mg daily without side effects. - she is due for another lipid panel-will check this today  Orders Placed This Encounter  Procedures   Lipid Panel w/reflex Direct LDL   Microalbumin / creatinine urine ratio   POCT glycosylated hemoglobin (Hb A1C)   Lela Fendt, MD PhD [facility] Endocrinology

## 2023-12-26 NOTE — Patient Instructions (Addendum)
 Please continue: - Mounjaro  12.5 mg weekly  Change: - U500: - 100 units in am  - 60 units before lunch - 40 units before dinner  Start using the [employer].  Please return in 1.5 months.

## 2023-12-27 ENCOUNTER — Ambulatory Visit: Payer: Self-pay | Admitting: Internal Medicine

## 2023-12-27 LAB — LIPID PANEL W/REFLEX DIRECT LDL
Cholesterol: 118 mg/dL
HDL: 46 mg/dL — ABNORMAL LOW
LDL Cholesterol (Calc): 45 mg/dL
Non-HDL Cholesterol (Calc): 72 mg/dL
Total CHOL/HDL Ratio: 2.6 (calc)
Triglycerides: 194 mg/dL — ABNORMAL HIGH

## 2023-12-27 LAB — MICROALBUMIN / CREATININE URINE RATIO
Creatinine, Urine: 35 mg/dL (ref 20–275)
Microalb Creat Ratio: 29 mg/g{creat}
Microalb, Ur: 1 mg/dL

## 2024-01-20 ENCOUNTER — Ambulatory Visit: Payer: Self-pay

## 2024-01-20 ENCOUNTER — Ambulatory Visit: Admitting: Family Medicine

## 2024-01-20 ENCOUNTER — Encounter: Payer: Self-pay | Admitting: Family Medicine

## 2024-01-20 VITALS — BP 122/66 | HR 111 | Temp 99.2°F | Ht 66.0 in | Wt 235.0 lb

## 2024-01-20 DIAGNOSIS — J329 Chronic sinusitis, unspecified: Secondary | ICD-10-CM

## 2024-01-20 DIAGNOSIS — B9689 Other specified bacterial agents as the cause of diseases classified elsewhere: Secondary | ICD-10-CM | POA: Diagnosis not present

## 2024-01-20 MED ORDER — DOXYCYCLINE HYCLATE 100 MG PO TABS: 100.0000 mg | ORAL_TABLET | Freq: Two times a day (BID) | ORAL | 0 refills | Status: AC

## 2024-01-20 MED ORDER — PROMETHAZINE-DM 6.25-15 MG/5ML PO SYRP: 5.0000 mL | ORAL_SOLUTION | Freq: Four times a day (QID) | ORAL | 0 refills | Status: AC | PRN

## 2024-01-20 MED ORDER — ALBUTEROL SULFATE HFA 108 (90 BASE) MCG/ACT IN AERS: 2.0000 | INHALATION_SPRAY | Freq: Four times a day (QID) | RESPIRATORY_TRACT | 0 refills | Status: AC | PRN

## 2024-01-20 NOTE — Telephone Encounter (Signed)
 FYI Only or Action Required?: FYI only for provider: appointment scheduled on 01/20/24.  Patient was last seen in primary care on 06/21/2023 by Jordan, Betty G, MD.  Called Nurse Triage reporting Cough.  Symptoms began a week ago.  Interventions attempted: Nothing.  Symptoms are: gradually worsening.  Triage Disposition: See HCP Within 4 Hours (Or PCP Triage)  Patient/caregiver understands and will follow disposition?: Yes    Cough with green tan mucus x1 week ago. Mild SOB with exertion. No CP. Has not checked temp but has felt feverish and chilled. Scheduled appt with provider at different office in pt region today d/t no availability at home office with any provider within timeframe.     Message from [place] E sent at 01/20/2024  9:28 AM EST  Summary: oughing up greenish tan color mucus   Reason for Triage: coughing up greenish tan color mucus nausea sinus pressure         Reason for Disposition  [1] MILD difficulty breathing (e.g., minimal/no SOB at rest, SOB with walking, pulse < 100) AND [2] still present when not coughing  Answer Assessment - Initial Assessment Questions 1. ONSET: When did the cough begin?      1 week ago  2. SEVERITY: How bad is the cough today?      Moderate to severe  3. SPUTUM: Describe the color of your sputum (e.g., none, dry cough; clear, white, yellow, green)     Green tan  4. HEMOPTYSIS: Are you coughing up any blood? If Yes, ask: How much? (e.g., flecks, streaks, tablespoons, etc.)     Denies  5. DIFFICULTY BREATHING: Are you having difficulty breathing? If Yes, ask: How bad is it? (e.g., mild, moderate, severe)      Mild SOB with exertion  6. FEVER: Do you have a fever? If Yes, ask: What is your temperature, how was it measured, and when did it start?     Has not checked temp  7. CARDIAC HISTORY: Do you have any history of heart disease? (e.g., heart attack, congestive heart failure)      CHF, NSTEMI, double  bypass  8. LUNG HISTORY: Do you have any history of lung disease?  (e.g., pulmonary embolus, asthma, emphysema)     Denies  9. PE RISK FACTORS: Do you have a history of blood clots? (or: recent major surgery, recent prolonged travel, bedridden)     Denies  10. OTHER SYMPTOMS: Do you have any other symptoms? (e.g., runny nose, wheezing, chest pain)       Runny nose, scratchy throat, achey, intermittent sinus headache  Protocols used: Cough - Acute Productive-A-AH

## 2024-01-20 NOTE — Patient Instructions (Signed)
 Follow up as needed or as scheduled START the Doxycycline  twice daily USE the Albuterol  inhaler as needed for cough/shortness of breath/wheezing ADD the cough syrup as needed- may cause drowsiness so it's perfect before bed REST! Call with any questions or concerns Hang in there! HAPPY EARLY BIRTHDAY!!!

## 2024-01-20 NOTE — Progress Notes (Signed)
" ° °  Subjective:    Patient ID: Catherine Moon, female    DOB: [birth year], 64 y.o.   MRN: [id number]  HPI Cough- pt reports productive cough w/ nasal congestion.  On day 10 of sxs.  Thought she was starting to feel better but yesterday developed 'really bad HA' and chills.  Having maxillary sinus pressure.  Husband currently has PNA.  Cough is productive of 'greenish tan' sputum.  Some SOB w/ exertion.  Some wheezing- particularly at night.   Review of Systems For ROS see HPI     Objective:   Physical Exam Vitals reviewed.  Constitutional:      General: She is not in acute distress.    Appearance: She is well-developed. She is not ill-appearing.  HENT:     Head: Normocephalic and atraumatic.     Right Ear: Tympanic membrane normal.     Left Ear: Tympanic membrane normal.     Nose: Mucosal edema and congestion present. No rhinorrhea.     Right Sinus: Maxillary sinus tenderness and frontal sinus tenderness present.     Left Sinus: Maxillary sinus tenderness and frontal sinus tenderness present.     Mouth/Throat:     Pharynx: Uvula midline. Posterior oropharyngeal erythema (copious PND) present. No oropharyngeal exudate.  Eyes:     Conjunctiva/sclera: Conjunctivae normal.     Pupils: Pupils are equal, round, and reactive to light.  Cardiovascular:     Rate and Rhythm: Normal rate and regular rhythm.     Heart sounds: Normal heart sounds.  Pulmonary:     Effort: Pulmonary effort is normal. No respiratory distress.     Breath sounds: Normal breath sounds. No wheezing.     Comments: + hacking cough Musculoskeletal:     Cervical back: Normal range of motion and neck supple.  Lymphadenopathy:     Cervical: No cervical adenopathy.  Skin:    General: Skin is warm and dry.  Neurological:     General: No focal deficit present.     Mental Status: She is alert and oriented to person, place, and time.     Cranial Nerves: No cranial nerve deficit.     Motor: No weakness.      Coordination: Coordination normal.  Psychiatric:        Mood and Affect: Mood normal.        Behavior: Behavior normal.        Thought Content: Thought content normal.           Assessment & Plan:  Bacterial sinusitis- new.  Pt's sxs are consistent w/ dx- HA, facial pressure, 2nd sickening- and PE confirms.  Start Doxycycline  which will treat both her sinuses and possible PNA due to exposure at home.  Start cough meds and albuterol  PRN.  Reviewed supportive care and red flags that should prompt return.  Pt expressed understanding and is in agreement w/ plan.   "

## 2024-01-28 ENCOUNTER — Other Ambulatory Visit: Payer: Self-pay | Admitting: Adult Health

## 2024-01-28 ENCOUNTER — Other Ambulatory Visit: Payer: Self-pay | Admitting: Gynecologic Oncology

## 2024-01-28 DIAGNOSIS — N8500 Endometrial hyperplasia, unspecified: Secondary | ICD-10-CM

## 2024-01-30 ENCOUNTER — Telehealth: Payer: Self-pay

## 2024-01-30 NOTE — Telephone Encounter (Signed)
-----   Message from Lela Fendt, MD sent at 01/27/2024  4:43 PM EST ----- No, we do not need any labs we just checked them last month... C ----- Message ----- From: Marlyn Anton HERO, CMA Sent: 01/27/2024   4:02 PM EST To: Lela Fendt, MD  Patient has and appt for labs on 02/07/24 did you want to put in any orders for labs?

## 2024-02-01 ENCOUNTER — Telehealth: Payer: Self-pay | Admitting: Cardiovascular Disease

## 2024-02-01 NOTE — Telephone Encounter (Signed)
" °*  STAT* If patient is at the pharmacy, call can be transferred to refill team.   1. Which medications need to be refilled? (please list name of each medication and dose if known)    Clopidogrel  Bisulfate 75 mg Oral Daily    amLODIPine  Besylate 5 mg Oral Daily   4. Which pharmacy/location (including street and city if local pharmacy) is medication to be sent to?  [id number] [place] [place], [place] - [address] [place], [place] [address] Phone: [phone]  Fax: [phone]    5. Do they need a 30 day or 90 day supply? 90  Sending it again cause not sure if it makes a difference that provider changed   "

## 2024-02-02 NOTE — Telephone Encounter (Signed)
 Both rx sent 01/21/2024.

## 2024-02-03 ENCOUNTER — Encounter: Payer: Self-pay | Admitting: Cardiovascular Disease

## 2024-02-03 MED ORDER — CLOPIDOGREL BISULFATE 75 MG PO TABS: 75.0000 mg | ORAL_TABLET | Freq: Every day | ORAL | 3 refills | Status: AC

## 2024-02-03 MED ORDER — AMLODIPINE BESYLATE 5 MG PO TABS: 5.0000 mg | ORAL_TABLET | Freq: Every day | ORAL | 3 refills | Status: AC

## 2024-02-03 NOTE — Addendum Note (Signed)
 Addended by: JOSHUA ANDREZ PARAS on: 02/03/2024 10:40 AM   Modules accepted: Orders

## 2024-02-07 ENCOUNTER — Other Ambulatory Visit

## 2024-04-26 ENCOUNTER — Ambulatory Visit: Admitting: Internal Medicine
